# Patient Record
Sex: Female | Born: 1980 | Race: White | Hispanic: No | Marital: Single | Smoking: Current every day smoker
Health system: Southern US, Community
[De-identification: ages and names within clinical notes are randomized; demographics above are authoritative.]

## PROBLEM LIST (undated history)

## (undated) ENCOUNTER — Emergency Department (HOSPITAL_COMMUNITY): Payer: 59 | Source: Home / Self Care

## (undated) DIAGNOSIS — F1410 Cocaine abuse, uncomplicated: Secondary | ICD-10-CM

## (undated) DIAGNOSIS — F1010 Alcohol abuse, uncomplicated: Secondary | ICD-10-CM

## (undated) DIAGNOSIS — F1110 Opioid abuse, uncomplicated: Secondary | ICD-10-CM

---

## 2017-09-19 ENCOUNTER — Emergency Department (HOSPITAL_COMMUNITY)
Admission: EM | Admit: 2017-09-19 | Discharge: 2017-09-19 | Disposition: A | Discharge status: Home / Self Care | Payer: Self-pay | Attending: Emergency Medicine | Admitting: Emergency Medicine

## 2017-09-19 ENCOUNTER — Other Ambulatory Visit: Payer: Self-pay

## 2017-09-19 ENCOUNTER — Emergency Department (HOSPITAL_COMMUNITY): Payer: Self-pay

## 2017-09-19 DIAGNOSIS — X501XXA Overexertion from prolonged static or awkward postures, initial encounter: Secondary | ICD-10-CM | POA: Insufficient documentation

## 2017-09-19 DIAGNOSIS — Y999 Unspecified external cause status: Secondary | ICD-10-CM | POA: Insufficient documentation

## 2017-09-19 DIAGNOSIS — Y939 Activity, unspecified: Secondary | ICD-10-CM | POA: Insufficient documentation

## 2017-09-19 DIAGNOSIS — Y929 Unspecified place or not applicable: Secondary | ICD-10-CM | POA: Insufficient documentation

## 2017-09-19 DIAGNOSIS — S93401A Sprain of unspecified ligament of right ankle, initial encounter: Secondary | ICD-10-CM | POA: Insufficient documentation

## 2017-09-19 MED ORDER — TRAMADOL HCL 50 MG PO TABS: 50.0000 mg | ORAL_TABLET | Freq: Four times a day (QID) | ORAL | 0 refills | Status: DC | PRN

## 2017-09-19 MED ORDER — IBUPROFEN 400 MG PO TABS
600.0000 mg | ORAL_TABLET | Freq: Once | ORAL | Status: AC
  Administered 2017-09-19: 600 mg via ORAL
  Filled 2017-09-19: qty 1

## 2017-09-19 MED ORDER — IBUPROFEN 600 MG PO TABS: 600.0000 mg | ORAL_TABLET | Freq: Four times a day (QID) | ORAL | 0 refills | Status: DC | PRN

## 2017-09-19 NOTE — Progress Notes (Signed)
Orthopedic Tech Progress Note Patient Details:  Sarah Cervantes July 27, 1980 [id number]  Ortho Devices Type of Ortho Device: Crutches, ASO Ortho Device/Splint Location: RLE Ortho Device/Splint Interventions: Ordered, Application, Adjustment   Post Interventions Patient Tolerated: Well Instructions Provided: Care of device   Braulio Bosch 09/19/2017, 5:13 PM

## 2017-09-19 NOTE — ED Notes (Signed)
Ortho tech at bedside 

## 2017-09-19 NOTE — ED Notes (Signed)
Patient transported to X-ray 

## 2017-09-19 NOTE — ED Triage Notes (Signed)
Pt tripped on porch steps today walking up the stairs and hurt her right ankle. Pt states she is unable to put weight on her right ankle

## 2017-09-19 NOTE — ED Provider Notes (Signed)
  [facility] EMERGENCY DEPARTMENT Provider Note   CSN: [id number] Arrival date & time: 09/19/17  1541     History   Chief Complaint Chief Complaint  Patient presents with  . Ankle Pain    HPI Treyana Sturgell is a 37 y.o. female.  The history is provided by the patient. No language interpreter was used.  Ankle Pain   The incident occurred less than 1 hour ago. The injury mechanism was torsion. The pain is present in the right ankle. The pain is moderate. The pain has been constant since onset. Associated symptoms include numbness. She has tried nothing for the symptoms. The treatment provided no relief.    No past medical history on file.  There are no active problems to display for this patient.      OB History   None      Home Medications    Prior to Admission medications   Not on File    Family History No family history on file.  Social History Social History   Tobacco Use  . Smoking status: Not on file  Substance Use Topics  . Alcohol use: Not on file  . Drug use: Not on file     Allergies   Patient has no allergy information on record.   Review of Systems Review of Systems  Neurological: Positive for numbness.  All other systems reviewed and are negative.    Physical Exam Updated Vital Signs BP (!) 147/93 (BP Location: Right Arm)   Pulse 73   Temp 97.6 F (36.4 C) (Oral)   Resp 18   LMP 08/19/2017 (Within Weeks)   SpO2 98%   Physical Exam  Constitutional: She appears well-developed and well-nourished.  Cardiovascular: Normal rate.  Pulmonary/Chest: Effort normal.  Musculoskeletal: She exhibits tenderness.  Swollen right ankle, pain with range of motion  nv intact   Neurological: She is alert.  Skin: Skin is warm.  Psychiatric: She has a normal mood and affect.     ED Treatments / Results  Labs (all labs ordered are listed, but only abnormal results are displayed) Labs Reviewed - No data to  display  EKG None  Radiology No results found.  Procedures Procedures (including critical care time)  Medications Ordered in ED Medications  ibuprofen (ADVIL,MOTRIN) tablet 600 mg (has no administration in time range)     Initial Impression / Assessment and Plan / ED Course  I have reviewed the triage vital signs and the nursing notes.  Pertinent labs & imaging results that were available during my care of the patient were reviewed by me and considered in my medical decision making (see chart for details).    Pt placed in an aso and given crutches.  Pt advised to follow up with Orthopaedist if pain persist   Final Clinical Impressions(s) / ED Diagnoses   Final diagnoses:  Sprain of right ankle, unspecified ligament, initial encounter    ED Discharge Orders        Ordered    ibuprofen (ADVIL,MOTRIN) 600 MG tablet  Every 6 hours PRN     09/19/17 1652    An After Visit Summary was printed and given to the patient.   Fransico Meadow, [place] 09/19/17 1653    Mesner, Corene Cornea, MD 09/19/17 2230

## 2017-09-28 ENCOUNTER — Ambulatory Visit: Payer: Self-pay | Admitting: Podiatry

## 2017-10-09 ENCOUNTER — Ambulatory Visit (INDEPENDENT_AMBULATORY_CARE_PROVIDER_SITE_OTHER): Payer: Self-pay | Admitting: Podiatry

## 2017-10-09 ENCOUNTER — Encounter: Payer: Self-pay | Admitting: Podiatry

## 2017-10-09 ENCOUNTER — Ambulatory Visit (INDEPENDENT_AMBULATORY_CARE_PROVIDER_SITE_OTHER): Payer: Self-pay

## 2017-10-09 DIAGNOSIS — T148XXA Other injury of unspecified body region, initial encounter: Secondary | ICD-10-CM

## 2017-10-09 DIAGNOSIS — D3613 Benign neoplasm of peripheral nerves and autonomic nervous system of lower limb, including hip: Secondary | ICD-10-CM

## 2017-10-09 DIAGNOSIS — S82891A Other fracture of right lower leg, initial encounter for closed fracture: Secondary | ICD-10-CM

## 2017-10-09 MED ORDER — MELOXICAM 15 MG PO TABS: 15.0000 mg | ORAL_TABLET | Freq: Every day | ORAL | 0 refills | Status: DC

## 2017-10-10 NOTE — Progress Notes (Signed)
Subjective:   Patient ID: Sarah Cervantes, female   DOB: 37 y.o.   MRN: [id number]   HPI 37 year old female presents presents the office today for concerns of right ankle and foot pain.  She states that she has had a neuroma to the right foot and she points to the second interspace where she gets burning pain to the area into the second and third toes.  She is previously had area "frozen" she has had a steroid injection.  She states that after the area was "frozen" the pain came back and came back even worse about 6 months afterwards.  She was later seen by another doctor she had steroid injections which helped for some time.  She presents today for follow-up evaluation.  Also more recently she fell about 3 weeks ago injuring her right ankle she was seen in the emergency department.  She was put into an ankle brace but she cannot wear it at work she states.   Review of Systems  All other systems reviewed and are negative.  History reviewed. No pertinent past medical history.  History reviewed. No pertinent surgical history.   Current Outpatient Medications:  .  ibuprofen (ADVIL,MOTRIN) 600 MG tablet, Take 1 tablet (600 mg total) by mouth every 6 (six) hours as needed., Disp: 30 tablet, Rfl: 0 .  meloxicam (MOBIC) 15 MG tablet, Take 1 tablet (15 mg total) by mouth daily., Disp: 30 tablet, Rfl: 0 .  naproxen sodium (ALEVE) 220 MG tablet, Take 220 mg by mouth 2 (two) times daily as needed (for headaches)., Disp: , Rfl:  .  traMADol (ULTRAM) 50 MG tablet, Take 1 tablet (50 mg total) by mouth every 6 (six) hours as needed., Disp: 15 tablet, Rfl: 0  Allergies  Allergen Reactions  . Latex Swelling and Rash    Hands swell         Objective:  Physical Exam  General: AAO x3, NAD  Dermatological: Skin is warm, dry and supple bilateral. Nails x 10 are well manicured; remaining integument appears unremarkable at this time. There are no open sores, no preulcerative lesions, no rash or signs of infection  present.  Vascular: Dorsalis Pedis artery and Posterior Tibial artery pedal pulses are 2/4 bilateral with immedate capillary fill time. Pedal hair growth present. No varicosities and no lower extremity edema present bilateral. There is no pain with calf compression, swelling, warmth, erythema.   Neruologic: Grossly intact via light touch bilateral. Vibratory intact via tuning fork bilateral. Protective threshold with Semmes Wienstein monofilament intact to all pedal sites bilateral. Patellar and Achilles deep tendon reflexes 2+ bilateral. No Babinski or clonus noted bilateral.   Musculoskeletal: There is tenderness palpation of the second interspace on the right foot there is palpable neuroma identified and subjectively there is numbness and tingling into the second and third toes upon palpation.  There is no area of tenderness identified to the metatarsals.  There is tenderness to the medial malleolus as well as on the ATFL as well as the syndesmosis of the right ankle.  Ankle joint range of motion appears to be intact however there is diffuse tenderness to the ankle joint.  Muscular strength 5/5 in all groups tested bilateral.     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  We discussed the treatment options for the neuroma and after discussion she wished to proceed with a steroid injection.  See procedure note below.  In regards to her ankle I do think she is immobilization in the cam boot.  We had a sample of the boot that I did give her today.  I want her to wear this much possible.  She states that she has to work and she cannot work at this point.  I recommend her to get an over-the-counter ankle brace to wear during that  timeframe if she has to work and cannot wear the brace. -Follow-up in 4 weeks or sooner if needed.  Procedure: Injection neuroma right third interspace Discussed alternatives, risks, complications and verbal consent was obtained.  Location: Right third interspace Skin Prep: Alcohol. Injectate: 0.5cc 0.5% marcaine plain, 0.5 cc 2% lidocaine plain and, 1 cc kenalog 10. Disposition: Patient tolerated procedure well. Injection site dressed with a band-aid.  Post-injection care was discussed and return precautions discussed.   Sarah Cervantes DPM

## 2017-10-13 ENCOUNTER — Emergency Department (HOSPITAL_COMMUNITY)
Admission: EM | Admit: 2017-10-13 | Discharge: 2017-10-13 | Disposition: A | Discharge status: Home / Self Care | Payer: Self-pay | Attending: Emergency Medicine | Admitting: Emergency Medicine

## 2017-10-13 ENCOUNTER — Encounter: Payer: Self-pay | Admitting: Emergency Medicine

## 2017-10-13 DIAGNOSIS — R55 Syncope and collapse: Secondary | ICD-10-CM | POA: Insufficient documentation

## 2017-10-13 DIAGNOSIS — Z5321 Procedure and treatment not carried out due to patient leaving prior to being seen by health care provider: Secondary | ICD-10-CM | POA: Insufficient documentation

## 2017-10-13 MED ORDER — ALBUTEROL SULFATE (2.5 MG/3ML) 0.083% IN NEBU
5.0000 mg | INHALATION_SOLUTION | Freq: Once | RESPIRATORY_TRACT | Status: AC
  Administered 2017-10-13: 5 mg via RESPIRATORY_TRACT
  Filled 2017-10-13: qty 6

## 2017-10-13 NOTE — ED Notes (Signed)
Called pt no response.  

## 2017-10-13 NOTE — ED Triage Notes (Signed)
Pt reports Sho[employer] today while at work. Pt reports (+) syncope episode while walking from work to car in parking lot. Pt reports pain in back and legs since fall. Pt has hx of childhood asthma.

## 2018-03-23 ENCOUNTER — Encounter (HOSPITAL_COMMUNITY): Payer: Self-pay

## 2018-03-23 ENCOUNTER — Emergency Department (HOSPITAL_COMMUNITY): Payer: Self-pay

## 2018-03-23 ENCOUNTER — Inpatient Hospital Stay (HOSPITAL_COMMUNITY)
Admission: EM | Admit: 2018-03-23 | Discharge: 2018-03-26 | DRG: 872 | Disposition: A | Discharge status: Other Acute Inpatient Hospital | Payer: Self-pay | Attending: Internal Medicine | Admitting: Internal Medicine

## 2018-03-23 ENCOUNTER — Other Ambulatory Visit: Payer: Self-pay

## 2018-03-23 DIAGNOSIS — R45851 Suicidal ideations: Secondary | ICD-10-CM | POA: Diagnosis present

## 2018-03-23 DIAGNOSIS — F1120 Opioid dependence, uncomplicated: Secondary | ICD-10-CM

## 2018-03-23 DIAGNOSIS — F1410 Cocaine abuse, uncomplicated: Secondary | ICD-10-CM

## 2018-03-23 DIAGNOSIS — F17210 Nicotine dependence, cigarettes, uncomplicated: Secondary | ICD-10-CM | POA: Diagnosis present

## 2018-03-23 DIAGNOSIS — A4151 Sepsis due to Escherichia coli [E. coli]: Principal | ICD-10-CM | POA: Diagnosis present

## 2018-03-23 DIAGNOSIS — Z9114 Patient's other noncompliance with medication regimen: Secondary | ICD-10-CM

## 2018-03-23 DIAGNOSIS — F10129 Alcohol abuse with intoxication, unspecified: Secondary | ICD-10-CM

## 2018-03-23 DIAGNOSIS — F1110 Opioid abuse, uncomplicated: Secondary | ICD-10-CM | POA: Diagnosis present

## 2018-03-23 DIAGNOSIS — A419 Sepsis, unspecified organism: Secondary | ICD-10-CM

## 2018-03-23 DIAGNOSIS — F1010 Alcohol abuse, uncomplicated: Secondary | ICD-10-CM

## 2018-03-23 DIAGNOSIS — Z91040 Latex allergy status: Secondary | ICD-10-CM

## 2018-03-23 DIAGNOSIS — F1920 Other psychoactive substance dependence, uncomplicated: Secondary | ICD-10-CM

## 2018-03-23 DIAGNOSIS — K921 Melena: Secondary | ICD-10-CM | POA: Diagnosis present

## 2018-03-23 DIAGNOSIS — F1420 Cocaine dependence, uncomplicated: Secondary | ICD-10-CM | POA: Diagnosis present

## 2018-03-23 DIAGNOSIS — F1414 Cocaine abuse with cocaine-induced mood disorder: Secondary | ICD-10-CM

## 2018-03-23 DIAGNOSIS — A4150 Gram-negative sepsis, unspecified: Secondary | ICD-10-CM | POA: Diagnosis present

## 2018-03-23 DIAGNOSIS — F3181 Bipolar II disorder: Secondary | ICD-10-CM | POA: Diagnosis present

## 2018-03-23 DIAGNOSIS — F4310 Post-traumatic stress disorder, unspecified: Secondary | ICD-10-CM

## 2018-03-23 DIAGNOSIS — R7881 Bacteremia: Secondary | ICD-10-CM

## 2018-03-23 HISTORY — DX: Alcohol abuse, uncomplicated: F10.10

## 2018-03-23 HISTORY — DX: Cocaine abuse, uncomplicated: F14.10

## 2018-03-23 HISTORY — DX: Opioid abuse, uncomplicated: F11.10

## 2018-03-23 LAB — URINALYSIS, ROUTINE W REFLEX MICROSCOPIC
BILIRUBIN URINE: NEGATIVE
GLUCOSE, UA: NEGATIVE mg/dL
KETONES UR: NEGATIVE mg/dL
Nitrite: NEGATIVE
PH: 7 (ref 5.0–8.0)
Protein, ur: NEGATIVE mg/dL
Specific Gravity, Urine: 1.005 (ref 1.005–1.030)

## 2018-03-23 LAB — CBC WITH DIFFERENTIAL/PLATELET
ABS IMMATURE GRANULOCYTES: 0.08 10*3/uL — AB (ref 0.00–0.07)
BASOS PCT: 0 %
Basophils Absolute: 0.1 10*3/uL (ref 0.0–0.1)
EOS PCT: 0 %
Eosinophils Absolute: 0 10*3/uL (ref 0.0–0.5)
HCT: 40.1 % (ref 36.0–46.0)
Hemoglobin: 12.7 g/dL (ref 12.0–15.0)
Immature Granulocytes: 0 %
Lymphocytes Relative: 9 %
Lymphs Abs: 1.7 10*3/uL (ref 0.7–4.0)
MCH: 29.8 pg (ref 26.0–34.0)
MCHC: 31.7 g/dL (ref 30.0–36.0)
MCV: 94.1 fL (ref 80.0–100.0)
MONOS PCT: 7 %
Monocytes Absolute: 1.3 10*3/uL — ABNORMAL HIGH (ref 0.1–1.0)
NEUTROS ABS: 15.1 10*3/uL — AB (ref 1.7–7.7)
Neutrophils Relative %: 84 %
PLATELETS: 559 10*3/uL — AB (ref 150–400)
RBC: 4.26 MIL/uL (ref 3.87–5.11)
RDW: 14.8 % (ref 11.5–15.5)
WBC: 18.3 10*3/uL — AB (ref 4.0–10.5)
nRBC: 0 % (ref 0.0–0.2)

## 2018-03-23 LAB — ETHANOL: Alcohol, Ethyl (B): <10 mg/dL (ref <10)

## 2018-03-23 LAB — COMPREHENSIVE METABOLIC PANEL
ALT: 16 U/L (ref 0–44)
AST: 14 U/L — AB (ref 15–41)
Albumin: 2.9 g/dL — ABNORMAL LOW (ref 3.5–5.0)
Alkaline Phosphatase: 59 U/L (ref 38–126)
Anion gap: 8 (ref 5–15)
BILIRUBIN TOTAL: 0.9 mg/dL (ref 0.3–1.2)
BUN: 8 mg/dL (ref 6–20)
CALCIUM: 8.1 mg/dL — AB (ref 8.9–10.3)
CO2: 23 mmol/L (ref 22–32)
CREATININE: 0.82 mg/dL (ref 0.44–1.00)
Chloride: 108 mmol/L (ref 98–111)
GFR calc Af Amer: >60 mL/min (ref >60)
GLUCOSE: 106 mg/dL — AB (ref 70–99)
POTASSIUM: 3.7 mmol/L (ref 3.5–5.1)
Sodium: 139 mmol/L (ref 135–145)
TOTAL PROTEIN: 7.1 g/dL (ref 6.5–8.1)

## 2018-03-23 LAB — BASIC METABOLIC PANEL
Anion gap: 7 (ref 5–15)
BUN: 9 mg/dL (ref 6–20)
CALCIUM: 8 mg/dL — AB (ref 8.9–10.3)
CHLORIDE: 108 mmol/L (ref 98–111)
CO2: 24 mmol/L (ref 22–32)
CREATININE: 0.85 mg/dL (ref 0.44–1.00)
GFR calc Af Amer: >60 mL/min (ref >60)
GFR calc non Af Amer: >60 mL/min (ref >60)
GLUCOSE: 96 mg/dL (ref 70–99)
Potassium: 3.8 mmol/L (ref 3.5–5.1)
Sodium: 139 mmol/L (ref 135–145)

## 2018-03-23 LAB — I-STAT CG4 LACTIC ACID, ED: LACTIC ACID, VENOUS: 0.97 mmol/L (ref 0.5–1.9)

## 2018-03-23 MED ORDER — LORAZEPAM 2 MG/ML IJ SOLN
0.0000 mg | Freq: Four times a day (QID) | INTRAMUSCULAR | Status: DC
  Administered 2018-03-24: 2 mg via INTRAVENOUS
  Filled 2018-03-23: qty 1

## 2018-03-23 MED ORDER — THIAMINE HCL 100 MG/ML IJ SOLN
100.0000 mg | Freq: Every day | INTRAMUSCULAR | Status: DC
  Filled 2018-03-23: qty 2

## 2018-03-23 MED ORDER — VANCOMYCIN HCL IN DEXTROSE 1-5 GM/200ML-% IV SOLN
1000.0000 mg | INTRAVENOUS | Status: AC
  Administered 2018-03-23: 1000 mg via INTRAVENOUS
  Filled 2018-03-23: qty 200

## 2018-03-23 MED ORDER — VANCOMYCIN HCL 10 G IV SOLR
1500.0000 mg | Freq: Two times a day (BID) | INTRAVENOUS | Status: DC
  Administered 2018-03-24: 1500 mg via INTRAVENOUS
  Filled 2018-03-23: qty 1500

## 2018-03-23 MED ORDER — ADULT MULTIVITAMIN W/MINERALS CH
1.0000 | ORAL_TABLET | Freq: Every day | ORAL | Status: DC
  Administered 2018-03-23 – 2018-03-26 (×4): 1 via ORAL
  Filled 2018-03-23 (×4): qty 1

## 2018-03-23 MED ORDER — VANCOMYCIN HCL IN DEXTROSE 1-5 GM/200ML-% IV SOLN
1000.0000 mg | Freq: Once | INTRAVENOUS | Status: DC
  Administered 2018-03-23: 1000 mg via INTRAVENOUS
  Filled 2018-03-23: qty 200

## 2018-03-23 MED ORDER — LORAZEPAM 2 MG/ML IJ SOLN
1.0000 mg | Freq: Four times a day (QID) | INTRAMUSCULAR | Status: DC | PRN
  Administered 2018-03-24: 1 mg via INTRAVENOUS
  Filled 2018-03-23: qty 1

## 2018-03-23 MED ORDER — KETOROLAC TROMETHAMINE 30 MG/ML IJ SOLN
30.0000 mg | Freq: Once | INTRAMUSCULAR | Status: AC
  Administered 2018-03-23: 30 mg via INTRAVENOUS
  Filled 2018-03-23: qty 1

## 2018-03-23 MED ORDER — LORAZEPAM 2 MG/ML IJ SOLN: 0.0000 mg | Freq: Two times a day (BID) | INTRAMUSCULAR | Status: DC

## 2018-03-23 MED ORDER — SODIUM CHLORIDE 0.9 % IV SOLN
INTRAVENOUS | Status: DC
  Administered 2018-03-23 – 2018-03-24 (×3): via INTRAVENOUS

## 2018-03-23 MED ORDER — LORAZEPAM 1 MG PO TABS
1.0000 mg | ORAL_TABLET | Freq: Four times a day (QID) | ORAL | Status: DC | PRN
  Administered 2018-03-23: 1 mg via ORAL
  Filled 2018-03-23: qty 1

## 2018-03-23 MED ORDER — FOLIC ACID 1 MG PO TABS
1.0000 mg | ORAL_TABLET | Freq: Every day | ORAL | Status: DC
  Administered 2018-03-23 – 2018-03-26 (×4): 1 mg via ORAL
  Filled 2018-03-23 (×4): qty 1

## 2018-03-23 MED ORDER — SODIUM CHLORIDE 0.9 % IV SOLN
2.0000 g | Freq: Three times a day (TID) | INTRAVENOUS | Status: DC
  Administered 2018-03-23 – 2018-03-24 (×2): 2 g via INTRAVENOUS
  Filled 2018-03-23 (×3): qty 2

## 2018-03-23 MED ORDER — ENOXAPARIN SODIUM 60 MG/0.6ML ~~LOC~~ SOLN
60.0000 mg | SUBCUTANEOUS | Status: DC
  Administered 2018-03-23 – 2018-03-24 (×2): 60 mg via SUBCUTANEOUS
  Filled 2018-03-23 (×2): qty 0.6

## 2018-03-23 MED ORDER — VITAMIN B-1 100 MG PO TABS
100.0000 mg | ORAL_TABLET | Freq: Every day | ORAL | Status: DC
  Administered 2018-03-23 – 2018-03-26 (×4): 100 mg via ORAL
  Filled 2018-03-23 (×4): qty 1

## 2018-03-23 MED ORDER — METRONIDAZOLE IN NACL 5-0.79 MG/ML-% IV SOLN
500.0000 mg | Freq: Three times a day (TID) | INTRAVENOUS | Status: DC
  Administered 2018-03-23 – 2018-03-24 (×3): 500 mg via INTRAVENOUS
  Filled 2018-03-23 (×3): qty 100

## 2018-03-23 MED ORDER — SODIUM CHLORIDE 0.9 % IV SOLN
2.0000 g | Freq: Once | INTRAVENOUS | Status: AC
  Administered 2018-03-23: 2 g via INTRAVENOUS
  Filled 2018-03-23: qty 2

## 2018-03-23 MED ORDER — VANCOMYCIN HCL IN DEXTROSE 1-5 GM/200ML-% IV SOLN: 1000.0000 mg | Freq: Once | INTRAVENOUS | Status: DC

## 2018-03-23 NOTE — Progress Notes (Signed)
A consult was received from an ED physician for Vancomycin and Cefepime per pharmacy dosing.  The patient's profile has been reviewed for ht/wt/allergies/indication/available labs.   A one time order has been placed for Cefepime 2g and Vancomycin 1g + 1g.   Further antibiotics/pharmacy consults should be ordered by admitting physician if indicated.                       Thank you, Lolita Patella 03/23/2018  2:16 PM

## 2018-03-23 NOTE — ED Notes (Signed)
Hospitalist paged for sitter order

## 2018-03-23 NOTE — ED Notes (Signed)
Bed: BD53 Expected date:  Expected time:  Means of arrival:  Comments: Sepsis fever 103

## 2018-03-23 NOTE — Progress Notes (Signed)
Pharmacy Antibiotic Note  Sarah Cervantes is a 37 y.o. female admitted on 03/23/2018 with sepsis.  Pharmacy has been consulted for vanc/cefepime dosing.  Plan: 1) Vancomycin 1g x 1 then another 1g x 1 in ER for total loading dose of 2g then 1500mg  IV q12 - goal AUC 400-500 2) Cefepime 2g x 1 then 2g IV q8 3) Daily SCr  Height: 5\' 4"  (162.6 cm) Weight: 290 lb (131.5 kg) IBW/kg (Calculated) : 54.7  Temp (24hrs), Avg:100.9 F (38.3 C), Min:100.9 F (38.3 C), Max:100.9 F (38.3 C)  Recent Labs  Lab 03/23/18 1411 03/23/18 1435  WBC 18.3*  --   CREATININE 0.82  --   LATICACIDVEN  --  0.97    Estimated Creatinine Clearance: 124.2 mL/min (by C-G formula based on SCr of 0.82 mg/dL).    Allergies  Allergen Reactions  . Latex Swelling and Rash    Hands swell    Thank you for allowing pharmacy to be a part of this patient's care.  Kara Mead 03/23/2018 4:46 PM

## 2018-03-23 NOTE — ED Notes (Signed)
Upon speaking with pt, pt states "Have you ever wanted to die?" This nurse asked the pt if she was suicidal. Pt endorses SI. Pt states she has auditory hallucinations. Pt endorses at time they are command hallucinations to harm herself. Pt states that she hasnt been taking her medications

## 2018-03-23 NOTE — H&P (Signed)
History and Physical    Sarah Cervantes XHB:[id number] DOB: 19-Jan-1981 DOA: 03/23/2018  PCP: Patient, No Pcp Per Patient coming from: home    Chief Complaint: Fever  HPI: Sarah Cervantes is a 37 y.o. 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  She did notice some blood flecks in the stool.  Patient received Tylenol by EMS before coming to the ER.  She also has a history of alcohol use and drinks 1-2 case of alcohol per week.  She works as a Training and development officer in [employer].  She reports she lives alone and she has no family.  She denies any chest pain shortness of breath or cough.  She denies abdominal pain or urinary complaints. ED Course: Patient received vancomycin, Flagyl, and cefepime.  Review of Systems: See above Past Medical History:  Diagnosis Date  . Cocaine abuse, daily use ([place]) 03/23/2018  . ETOH abuse 03/23/2018  . Heroin abuse ([place]) 03/23/2018    History reviewed. No pertinent surgical history.   reports that she has been smoking. She has a 30.00 pack-year smoking history. She has never used smokeless tobacco. She reports that she drinks alcohol. She reports that she has current or past drug history.  Allergies  Allergen Reactions  . Latex Swelling and Rash    Hands swell    No family history on file.  Prior to Admission medications   Not on File    Physical Exam: Vitals:   03/23/18 1600 03/23/18 1601 03/23/18 1615 03/23/18 1627  BP: 100/63 100/63    Pulse: 62 65 60 (!) 59  Resp: (!) 21 14 (!) 24 (!) 26  Temp:      TempSrc:      SpO2: 92% 92% 97% 97%  Weight:      Height:        Constitutional: NAD, calm, comfortable Vitals:   03/23/18 1600 03/23/18 1601 03/23/18 1615 03/23/18 1627  BP: 100/63 100/63    Pulse: 62 65 60 (!) 59  Resp: (!) 21 14 (!) 24 (!) 26  Temp:      TempSrc:      SpO2: 92% 92% 97%  97%  Weight:      Height:       Eyes: PERRL, lids and conjunctivae normal ENMT: Mucous membranes are moist. Posterior pharynx clear of any exudate or lesions.Normal dentition.  Neck: normal, supple, no masses, no thyromegaly Respiratory: clear to auscultation bilaterally, no wheezing, no crackles. Normal respiratory effort. No accessory muscle use.  Cardiovascular: Regular rate and rhythm, NO  murmurs / rubs / gallops. No extremity edema. 2+ pedal pulses. No carotid bruits.  Abdomen: no tenderness, no masses palpated. No hepatosplenomegaly. Bowel sounds positive.  Musculoskeletal: no clubbing / cyanosis. No joint deformity upper and lower extremities. Good ROM, no contractures. Normal muscle tone.  Skin: no rashes, lesions, ulcers. No induration Neurologic: CN 2-12 grossly intact. Sensation intact, DTR normal. Strength 5/5 in all 4.  Psychiatric: Normal judgment and insight. Alert and oriented x 3. Normal mood.    Labs on Admission: I have personally reviewed following labs and imaging studies  CBC: Recent Labs  Lab 03/23/18 1411  WBC 18.3*  NEUTROABS 15.1*  HGB 12.7  HCT 40.1  MCV 94.1  PLT 810*   Basic Metabolic Panel: Recent Labs  Lab 03/23/18 1411  NA 139  K 3.7  CL 108  CO2 23  GLUCOSE 106*  BUN 8  CREATININE 0.82  CALCIUM 8.1*   GFR: Estimated Creatinine Clearance: 124.2 mL/min (by C-G formula based on SCr of 0.82 mg/dL). Liver Function Tests: Recent Labs  Lab 03/23/18 1411  AST 14*  ALT 16  ALKPHOS 59  BILITOT 0.9  PROT 7.1  ALBUMIN 2.9*   No results for input(s): LIPASE, AMYLASE in the last 168 hours. No results for input(s): AMMONIA in the last 168 hours. Coagulation Profile: No results for input(s): INR, PROTIME in the last 168 hours. Cardiac Enzymes: No results for input(s): CKTOTAL, CKMB, CKMBINDEX, TROPONINI in the last 168 hours. BNP (last 3 results) No results for input(s): PROBNP in the last 8760 hours. HbA1C: No results for input(s):  HGBA1C in the last 72 hours. CBG: No results for input(s): GLUCAP in the last 168 hours. Lipid Profile: No results for input(s): CHOL, HDL, LDLCALC, TRIG, CHOLHDL, LDLDIRECT in the last 72 hours. Thyroid Function Tests: No results for input(s): TSH, T4TOTAL, FREET4, T3FREE, THYROIDAB in the last 72 hours. Anemia Panel: No results for input(s): VITAMINB12, FOLATE, FERRITIN, TIBC, IRON, RETICCTPCT in the last 72 hours. Urine analysis:    Component Value Date/Time   COLORURINE YELLOW 03/23/2018 [place] 03/23/2018 1428   LABSPEC 1.005 03/23/2018 1428   PHURINE 7.0 03/23/2018 1428   GLUCOSEU NEGATIVE 03/23/2018 1428   HGBUR SMALL (A) 03/23/2018 1428   BILIRUBINUR NEGATIVE 03/23/2018 1428   KETONESUR NEGATIVE 03/23/2018 1428   PROTEINUR NEGATIVE 03/23/2018 1428   NITRITE NEGATIVE 03/23/2018 1428   LEUKOCYTESUR SMALL (A) 03/23/2018 1428    Radiological Exams on Admission: Dg Chest Port 1 View  Result Date: 03/23/2018 CLINICAL DATA:  Pt. admits to snorting cocaine and using heroin last night. No known heart or lung conditions. EXAM: PORTABLE CHEST 1 VIEW COMPARISON:  None. FINDINGS: The heart size and mediastinal contours are within normal limits. Both lungs are clear. The visualized skeletal structures are unremarkable. IMPRESSION: No active disease. Electronically Signed   By: Nolon Nations M.D.   On: 03/23/2018 14:31    EKG: Independently reviewed.   Assessment/Plan Active Problems:   * No active hospital problems. * #1 rule out sepsis patient with history of IV drug abuse and fever.  Patient received vancomycin Flagyl and cefepime in the ER.  I will obtain MRSA PCR.  I will continue vancomycin and cefepime and follow-up blood cultures.  I do not hear a murmur I will obtain an echocardiogram to rule out endocarditis.  She has leukocytosis of 18.3.  #2 alcohol abuse I will place her on CIWA protocol. DVT prophylaxis: Lovenox Code Status: Full code Family  Communication: She has no family  Disposition Plan: Pending clinical improvement Consults called: None Admission status: Inpatient  Georgette Shell MD [facility] If 7PM-7AM, please contact night-coverage [email or web address] Password [facility]  03/23/2018, 4:34 PM

## 2018-03-23 NOTE — ED Triage Notes (Signed)
Patient did receive 1000mg  of acetaminophen orally, and has received about 350 mL of normal saline.

## 2018-03-23 NOTE — ED Notes (Signed)
Patient admits to using snorting cocaine last night and using IV heroin last night along with ETOH.

## 2018-03-23 NOTE — ED Provider Notes (Addendum)
Addendum [facility] DEPT Provider Note   CSN: [id number] Arrival date & time: 03/23/18  1258   Level 5 caveat acuity of situation  History   Chief Complaint Chief Complaint  Patient presents with  . Fatigue  . Fever    HPI Sarah Cervantes is a 37 y.o. female.  HPI With subjective fever onset last night accompanied by anterior chest pain since this morning which is constant.  She admits to vomiting 1 or 2 times since yesterday and having 2 or 3 episodes of diarrhea with some blood flecks in it.  EMS reported that patient had temperature 103 degrees in the field they treated patient with Tylenol prior to coming here and she received saline 350 mL intravenously prior to coming here.  Patient also admitted to nursing that she used cocaine last night and she admits to me injecting heroin yesterday while using alcohol. Past Medical History:  Diagnosis Date  . Cocaine abuse, daily use ([place]) 03/23/2018  . ETOH abuse 03/23/2018  . Heroin abuse ([facility]) 03/23/2018   Past medical history "mental health issues" stating "I am supposed to be on lithium but have not taken it for long time There are no active problems to display for this patient.   History reviewed. No pertinent surgical history.   OB History   None      Home Medications    Prior to Admission medications   Not on File    Family History No family history on file.  Social History Social History   Tobacco Use  . Smoking status: Current Every Day Smoker    Packs/day: 1.00    Years: 30.00    Pack years: 30.00  . Smokeless tobacco: Never Used  Substance Use Topics  . Alcohol use: Yes    Comment: Occ social drinker  . Drug use: Yes    Comment: Heroin and Cocain on 2018/03/23. Pt uses every night     Allergies   Latex   Review of Systems Review of Systems   Physical Exam Updated Vital Signs BP (!) 115/53 (BP Location: Right Arm)   Pulse 80   Temp (!) 100.9 F (38.3  C) (Rectal)   Resp (!) 24   Ht 5\' 4"  (1.626 m)   Wt 131.5 kg   LMP 03/23/2018 (Exact Date)   SpO2 92%   BMI 49.78 kg/m   Physical Exam  Constitutional:  Ill appearing sleepy, arousable to verbal stimulus  HENT:  Head: Normocephalic and atraumatic.  Eyes: Pupils are equal, round, and reactive to light. Conjunctivae are normal.  Neck: Neck supple. No tracheal deviation present. No thyromegaly present.  Cardiovascular: Normal rate, regular rhythm and normal heart sounds.  No murmur heard. Pulmonary/Chest: Effort normal and breath sounds normal.  Abdominal: Soft. Bowel sounds are normal. She exhibits no distension. There is no tenderness.  Morbidly obese  Musculoskeletal: Normal range of motion. She exhibits no edema or tenderness.  Neurological: She is alert. No cranial nerve deficit. Coordination normal.  Skin: Skin is warm and dry. No rash noted.  Right upper extremity with [place] antecubital fossa, no surrounding redness or tenderness.  Nursing note and vitals reviewed.    ED Treatments / Results  Labs (all labs ordered are listed, but only abnormal results are displayed) Labs Reviewed  CULTURE, BLOOD (ROUTINE X 2)  CULTURE, BLOOD (ROUTINE X 2)  COMPREHENSIVE METABOLIC PANEL  CBC WITH DIFFERENTIAL/PLATELET  URINALYSIS, ROUTINE W REFLEX MICROSCOPIC  I-STAT CG4 LACTIC ACID, ED  EKG None  Radiology No results found.  Procedures Procedures (including critical care time)  Medications Ordered in ED Medications  ceFEPIme (MAXIPIME) 2 g in sodium chloride 0.9 % 100 mL IVPB (has no administration in time range)  metroNIDAZOLE (FLAGYL) IVPB 500 mg (has no administration in time range)  vancomycin (VANCOCIN) IVPB 1000 mg/200 mL premix (has no administration in time range)     Initial Impression / Assessment and Plan / ED Course  I have reviewed the triage vital signs and the nursing notes.  Pertinent labs & imaging results that were available during my  care of the patient were reviewed by me and considered in my medical decision making (see chart for details).     Code sepsis called based on sirs criteria respiratory rate and temperature.  Source of infection unclear presently.  Concern for endocarditis given history of IV drug use and fever and chest pain Sepsis - Repeat Assessment  Performed at:    345pm  Vitals     Blood pressure (!) 115/53, pulse 80, temperature (!) 100.9 F (38.3 C), temperature source Rectal, resp. rate (!) 24, height 5\' 4"  (1.626 m), weight 131.5 kg, last menstrual period 03/23/2018, SpO2 92 %.  Heart:     Regular rate and rhythm  Lungs:    CTA  Capillary Refill:   <2 sec  Peripheral Pulse:   Radial pulse palpable  Skin:     Normal Color  3:40 PM patient feels improved and is stating she is hungry after treatment with IV  antibiotics    Dr.Williams consulted and will arrange for overnight stay  Concern for endocarditis and female with chest pain recent IV drug use and fever  Results for orders placed or performed during the hospital encounter of 03/23/18  Comprehensive metabolic panel  Result Value Ref Range   Sodium 139 135 - 145 mmol/L   Potassium 3.7 3.5 - 5.1 mmol/L   Chloride 108 98 - 111 mmol/L   CO2 23 22 - 32 mmol/L   Glucose, Bld 106 (H) 70 - 99 mg/dL   BUN 8 6 - 20 mg/dL   Creatinine, Ser 0.82 0.44 - 1.00 mg/dL   Calcium 8.1 (L) 8.9 - 10.3 mg/dL   Total Protein 7.1 6.5 - 8.1 g/dL   Albumin 2.9 (L) 3.5 - 5.0 g/dL   AST 14 (L) 15 - 41 U/L   ALT 16 0 - 44 U/L   Alkaline Phosphatase 59 38 - 126 U/L   Total Bilirubin 0.9 0.3 - 1.2 mg/dL   GFR calc non Af Amer >60 >60 mL/min   GFR calc Af Amer >60 >60 mL/min   Anion gap 8 5 - 15  CBC WITH DIFFERENTIAL  Result Value Ref Range   WBC 18.3 (H) 4.0 - 10.5 K/uL   RBC 4.26 3.87 - 5.11 MIL/uL   Hemoglobin 12.7 12.0 - 15.0 g/dL   HCT 40.1 36.0 - 46.0 %   MCV 94.1 80.0 - 100.0 fL   MCH 29.8 26.0 - 34.0 pg   MCHC 31.7 30.0 - 36.0 g/dL   RDW  14.8 11.5 - 15.5 %   Platelets 559 (H) 150 - 400 K/uL   nRBC 0.0 0.0 - 0.2 %   Neutrophils Relative % 84 %   Neutro Abs 15.1 (H) 1.7 - 7.7 K/uL   Lymphocytes Relative 9 %   Lymphs Abs 1.7 0.7 - 4.0 K/uL   Monocytes Relative 7 %   Monocytes Absolute 1.3 (H) 0.1 - 1.0  K/uL   Eosinophils Relative 0 %   Eosinophils Absolute 0.0 0.0 - 0.5 K/uL   Basophils Relative 0 %   Basophils Absolute 0.1 0.0 - 0.1 K/uL   Immature Granulocytes 0 %   Abs Immature Granulocytes 0.08 (H) 0.00 - 0.07 K/uL  Urinalysis, Routine w reflex microscopic  Result Value Ref Range   Color, Urine YELLOW YELLOW   APPearance CLEAR CLEAR   Specific Gravity, Urine 1.005 1.005 - 1.030   pH 7.0 5.0 - 8.0   Glucose, UA NEGATIVE NEGATIVE mg/dL   Hgb urine dipstick SMALL (A) NEGATIVE   Bilirubin Urine NEGATIVE NEGATIVE   Ketones, ur NEGATIVE NEGATIVE mg/dL   Protein, ur NEGATIVE NEGATIVE mg/dL   Nitrite NEGATIVE NEGATIVE   Leukocytes, UA SMALL (A) NEGATIVE   RBC / HPF 0-5 0 - 5 RBC/hpf   WBC, UA 6-10 0 - 5 WBC/hpf   Bacteria, UA RARE (A) NONE SEEN   Squamous Epithelial / LPF 0-5 0 - 5  I-Stat CG4 Lactic Acid, ED  (not at  [facility])  Result Value Ref Range   Lactic Acid, Venous 0.97 0.5 - 1.9 mmol/L   Dg Chest Port 1 View  Result Date: 03/23/2018 CLINICAL DATA:  Pt. admits to snorting cocaine and using heroin last night. No known heart or lung conditions. EXAM: PORTABLE CHEST 1 VIEW COMPARISON:  None. FINDINGS: The heart size and mediastinal contours are within normal limits. Both lungs are clear. The visualized skeletal structures are unremarkable. IMPRESSION: No active disease. Electronically Signed   By: Nolon Nations M.D.   On: 03/23/2018 14:31  Chest x-ray viewed by me. Lab work consistent with leukocytosis, consistent with sepsis Final Clinical Impressions(s) / ED Diagnoses  Dx Sepsis CRITICAL CARE Performed by: Orlie Dakin Total critical care time: 35 minutes Critical care time was exclusive of separately  billable procedures and treating other patients. Critical care was necessary to treat or prevent imminent or life-threatening deterioration. Critical care was time spent personally by me on the following activities: development of treatment plan with patient and/or surrogate as well as nursing, discussions with consultants, evaluation of patient's response to treatment, examination of patient, obtaining history from patient or surrogate, ordering and performing treatments and interventions, ordering and review of laboratory studies, ordering and review of radiographic studies, pulse oximetry and re-evaluation of patient's condition. Final diagnoses:  None    ED Discharge Orders    None       Orlie Dakin, MD 03/23/18 1549 Addendum: Expressed suicidal ideation to me and to nursing staff.  Suggest suicide precautions   Orlie Dakin, MD 03/23/18 1553

## 2018-03-23 NOTE — ED Triage Notes (Signed)
Pt comes from home. Pt arrived via [facility]. Pt called 911 due to being lethargic and when EMS arrived having a fever of 103 F Oral Temp. Pt is currently AOx4 and at baseline ambulatory. Patient denies any drug use however did drink some ETOH last night. [facility] stated that patient made comment about having dark, foul smelling urine.

## 2018-03-24 ENCOUNTER — Inpatient Hospital Stay (HOSPITAL_COMMUNITY): Payer: Self-pay

## 2018-03-24 DIAGNOSIS — R509 Fever, unspecified: Secondary | ICD-10-CM

## 2018-03-24 DIAGNOSIS — F1414 Cocaine abuse with cocaine-induced mood disorder: Secondary | ICD-10-CM

## 2018-03-24 DIAGNOSIS — F3181 Bipolar II disorder: Secondary | ICD-10-CM

## 2018-03-24 DIAGNOSIS — F1920 Other psychoactive substance dependence, uncomplicated: Secondary | ICD-10-CM

## 2018-03-24 DIAGNOSIS — F419 Anxiety disorder, unspecified: Secondary | ICD-10-CM

## 2018-03-24 DIAGNOSIS — F4310 Post-traumatic stress disorder, unspecified: Secondary | ICD-10-CM

## 2018-03-24 DIAGNOSIS — R45851 Suicidal ideations: Secondary | ICD-10-CM

## 2018-03-24 DIAGNOSIS — F10129 Alcohol abuse with intoxication, unspecified: Secondary | ICD-10-CM

## 2018-03-24 LAB — BLOOD CULTURE ID PANEL (REFLEXED)
ACINETOBACTER BAUMANNII: NOT DETECTED
CANDIDA ALBICANS: NOT DETECTED
CANDIDA GLABRATA: NOT DETECTED
CANDIDA KRUSEI: NOT DETECTED
Candida parapsilosis: NOT DETECTED
Candida tropicalis: NOT DETECTED
Carbapenem resistance: NOT DETECTED
ENTEROBACTER CLOACAE COMPLEX: NOT DETECTED
ENTEROBACTERIACEAE SPECIES: DETECTED — AB
ESCHERICHIA COLI: DETECTED — AB
Enterococcus species: NOT DETECTED
Haemophilus influenzae: NOT DETECTED
KLEBSIELLA OXYTOCA: NOT DETECTED
KLEBSIELLA PNEUMONIAE: NOT DETECTED
Listeria monocytogenes: NOT DETECTED
Neisseria meningitidis: NOT DETECTED
PSEUDOMONAS AERUGINOSA: NOT DETECTED
Proteus species: NOT DETECTED
STREPTOCOCCUS PYOGENES: NOT DETECTED
STREPTOCOCCUS SPECIES: NOT DETECTED
Serratia marcescens: NOT DETECTED
Staphylococcus aureus (BCID): NOT DETECTED
Staphylococcus species: NOT DETECTED
Streptococcus agalactiae: NOT DETECTED
Streptococcus pneumoniae: NOT DETECTED

## 2018-03-24 LAB — CREATININE, SERUM
Creatinine, Ser: 0.84 mg/dL (ref 0.44–1.00)
GFR calc Af Amer: >60 mL/min (ref >60)
GFR calc non Af Amer: >60 mL/min (ref >60)

## 2018-03-24 LAB — RAPID URINE DRUG SCREEN, HOSP PERFORMED
AMPHETAMINES: NOT DETECTED
BENZODIAZEPINES: NOT DETECTED
Barbiturates: NOT DETECTED
COCAINE: POSITIVE — AB
Opiates: NOT DETECTED
Tetrahydrocannabinol: NOT DETECTED

## 2018-03-24 LAB — CBC
HEMATOCRIT: 38.7 % (ref 36.0–46.0)
Hemoglobin: 11.9 g/dL — ABNORMAL LOW (ref 12.0–15.0)
MCH: 29.5 pg (ref 26.0–34.0)
MCHC: 30.7 g/dL (ref 30.0–36.0)
MCV: 96 fL (ref 80.0–100.0)
Platelets: 546 10*3/uL — ABNORMAL HIGH (ref 150–400)
RBC: 4.03 MIL/uL (ref 3.87–5.11)
RDW: 14.7 % (ref 11.5–15.5)
WBC: 15.4 10*3/uL — ABNORMAL HIGH (ref 4.0–10.5)
nRBC: 0 % (ref 0.0–0.2)

## 2018-03-24 LAB — MRSA PCR SCREENING: MRSA BY PCR: POSITIVE — AB

## 2018-03-24 LAB — ECHOCARDIOGRAM COMPLETE
HEIGHTINCHES: 64 in
Weight: 4677.28 oz

## 2018-03-24 LAB — HEMOGLOBIN A1C
Hgb A1c MFr Bld: 5.6 % (ref 4.8–5.6)
Mean Plasma Glucose: 114.02 mg/dL

## 2018-03-24 LAB — HIV ANTIBODY (ROUTINE TESTING W REFLEX): HIV SCREEN 4TH GENERATION: NONREACTIVE

## 2018-03-24 MED ORDER — GABAPENTIN 300 MG PO CAPS
300.0000 mg | ORAL_CAPSULE | Freq: Two times a day (BID) | ORAL | Status: DC
  Administered 2018-03-24 – 2018-03-26 (×4): 300 mg via ORAL
  Filled 2018-03-24 (×4): qty 1

## 2018-03-24 MED ORDER — CHLORHEXIDINE GLUCONATE CLOTH 2 % EX PADS
6.0000 | MEDICATED_PAD | Freq: Every day | CUTANEOUS | Status: DC
  Administered 2018-03-24 – 2018-03-26 (×2): 6 via TOPICAL

## 2018-03-24 MED ORDER — HALOPERIDOL LACTATE 5 MG/ML IJ SOLN
4.0000 mg | Freq: Four times a day (QID) | INTRAMUSCULAR | Status: DC | PRN
  Administered 2018-03-24: 4 mg via INTRAVENOUS
  Filled 2018-03-24: qty 1

## 2018-03-24 MED ORDER — SODIUM CHLORIDE 0.9 % IV SOLN
2.0000 g | INTRAVENOUS | Status: DC
  Administered 2018-03-24 – 2018-03-26 (×3): 2 g via INTRAVENOUS
  Filled 2018-03-24: qty 2
  Filled 2018-03-24: qty 20
  Filled 2018-03-24: qty 2

## 2018-03-24 MED ORDER — ACETAMINOPHEN 325 MG PO TABS
650.0000 mg | ORAL_TABLET | Freq: Four times a day (QID) | ORAL | Status: DC | PRN
  Administered 2018-03-24 – 2018-03-26 (×5): 650 mg via ORAL
  Filled 2018-03-24 (×5): qty 2

## 2018-03-24 MED ORDER — MUPIROCIN 2 % EX OINT
1.0000 "application " | TOPICAL_OINTMENT | Freq: Two times a day (BID) | CUTANEOUS | Status: DC
  Administered 2018-03-25 – 2018-03-26 (×4): 1 via NASAL
  Filled 2018-03-24: qty 22

## 2018-03-24 MED ORDER — HALOPERIDOL LACTATE 5 MG/ML IJ SOLN: 5.0000 mg | Freq: Four times a day (QID) | INTRAMUSCULAR | Status: DC | PRN

## 2018-03-24 NOTE — Progress Notes (Signed)
  Echocardiogram 2D Echocardiogram has been performed.  Sarah Cervantes F 03/24/2018, 9:54 AM

## 2018-03-24 NOTE — Consult Note (Signed)
[facility] Consult   Reason for Consult: suicidal thoughts Referring Physician:  Dr. Olevia Bowens Patient Identification: Sarah Cervantes MRN:  [id number] Principal Diagnosis: Bipolar II disorder [facility]) Diagnosis:   Patient Active Problem List   Diagnosis Date Noted  . Cocaine abuse with cocaine-induced mood disorder ([place]) [F14.14] 03/24/2018  . Chronic post-traumatic stress disorder (PTSD) [F43.12] 03/24/2018  . Bipolar II disorder ([place]) [F31.81] 03/24/2018  . Sepsis, Gram negative ([place]) [A41.50] 03/23/2018  . Alcohol abuse with intoxication ([place]) [F10.129] 03/23/2018  . Polysubstance (excluding opioids) dependence ([place]) [F19.20] 03/23/2018    Total Time spent with patient: 45 minutes  Subjective:   Sarah Cervantes is a 37 y.o. female patient admitted with fever.  HPI: Patient who reports history of Polysubstance dependence(Cocaine, Heroin, Alcohol), PTSD and Bipolar disorder who was admitted to the hospital due to chest pain and fever. Patient reports that she has been trying to kill herself for the past one week by overdosing on a bunch of Tylenol, cocaine, heroin and Alcohol. She reports increasing mood swings, depression, loneliness, hopelessness and recurrent suicidal thoughts since her girlfriend left her. She reports that she has been non-compliant with her medications and has been self medicating with drugs. Patient is requesting for help from drugs and mental health. Patient is unable to contract for safety.  Past Psychiatric History: as above  Risk to Self:  yes Risk to Others:  denies Prior Inpatient Therapy:  yes Prior Outpatient Therapy:  yes  Past Medical History:  Past Medical History:  Diagnosis Date  . Cocaine abuse, daily use ([place]) 03/23/2018  . ETOH abuse 03/23/2018  . Heroin abuse ([place]) 03/23/2018   History reviewed. No pertinent surgical history. Family History: No family history on file. Family Psychiatric  History:  Social History:  Social  History   Substance and Sexual Activity  Alcohol Use Yes   Comment: Occ social drinker     Social History   Substance and Sexual Activity  Drug Use Yes   Comment: Heroin and Cocain on 2018/03/23. Pt uses every night    Social History   Socioeconomic History  . Marital status: Single    Spouse name: Not on file  . Number of children: Not on file  . Years of education: Not on file  . Highest education level: Not on file  Occupational History  . Not on file  Social Needs  . Financial resource strain: Not on file  . Food insecurity:    Worry: Not on file    Inability: Not on file  . Transportation needs:    Medical: Not on file    Non-medical: Not on file  Tobacco Use  . Smoking status: Current Every Day Smoker    Packs/day: 1.00    Years: 30.00    Pack years: 30.00  . Smokeless tobacco: Never Used  Substance and Sexual Activity  . Alcohol use: Yes    Comment: Occ social drinker  . Drug use: Yes    Comment: Heroin and Cocain on 2018/03/23. Pt uses every night  . Sexual activity: Yes    Birth control/protection: None  Lifestyle  . Physical activity:    Days per week: Not on file    Minutes per session: Not on file  . Stress: Not on file  Relationships  . Social connections:    Talks on phone: Not on file    Gets together: Not on file    Attends religious service: Not on file    Active member of club  or organization: Not on file    Attends meetings of clubs or organizations: Not on file    Relationship status: Not on file  Other Topics Concern  . Not on file  Social History Narrative  . Not on file   Additional Social History:    Allergies:   Allergies  Allergen Reactions  . Latex Swelling and Rash    Hands swell    Labs:  Results for orders placed or performed during the hospital encounter of 03/23/18 (from the past 48 hour(s))  Comprehensive metabolic panel     Status: Abnormal   Collection Time: 03/23/18  2:11 PM  Result Value Ref Range    Sodium 139 135 - 145 mmol/L   Potassium 3.7 3.5 - 5.1 mmol/L   Chloride 108 98 - 111 mmol/L   CO2 23 22 - 32 mmol/L   Glucose, Bld 106 (H) 70 - 99 mg/dL   BUN 8 6 - 20 mg/dL   Creatinine, Ser 0.82 0.44 - 1.00 mg/dL   Calcium 8.1 (L) 8.9 - 10.3 mg/dL   Total Protein 7.1 6.5 - 8.1 g/dL   Albumin 2.9 (L) 3.5 - 5.0 g/dL   AST 14 (L) 15 - 41 U/L   ALT 16 0 - 44 U/L   Alkaline Phosphatase 59 38 - 126 U/L   Total Bilirubin 0.9 0.3 - 1.2 mg/dL   GFR calc non Af Amer >60 >60 mL/min   GFR calc Af Amer >60 >60 mL/min    Comment: (NOTE) The eGFR has been calculated using the CKD EPI equation. This calculation has not been validated in all clinical situations. eGFR's persistently <60 mL/min signify possible Chronic Kidney Disease.    Anion gap 8 5 - 15    Comment: Performed at [facility], [place] [address], [place], [place] [address]  CBC WITH DIFFERENTIAL     Status: Abnormal   Collection Time: 03/23/18  2:11 PM  Result Value Ref Range   WBC 18.3 (H) 4.0 - 10.5 K/uL   RBC 4.26 3.87 - 5.11 MIL/uL   Hemoglobin 12.7 12.0 - 15.0 g/dL   HCT 40.1 36.0 - 46.0 %   MCV 94.1 80.0 - 100.0 fL   MCH 29.8 26.0 - 34.0 pg   MCHC 31.7 30.0 - 36.0 g/dL   RDW 14.8 11.5 - 15.5 %   Platelets 559 (H) 150 - 400 K/uL   nRBC 0.0 0.0 - 0.2 %   Neutrophils Relative % 84 %   Neutro Abs 15.1 (H) 1.7 - 7.7 K/uL   Lymphocytes Relative 9 %   Lymphs Abs 1.7 0.7 - 4.0 K/uL   Monocytes Relative 7 %   Monocytes Absolute 1.3 (H) 0.1 - 1.0 K/uL   Eosinophils Relative 0 %   Eosinophils Absolute 0.0 0.0 - 0.5 K/uL   Basophils Relative 0 %   Basophils Absolute 0.1 0.0 - 0.1 K/uL   Immature Granulocytes 0 %   Abs Immature Granulocytes 0.08 (H) 0.00 - 0.07 K/uL    Comment: Performed at [facility], [place] [address] [place], [place] [address]  Blood Culture (routine x 2)     Status: None (Preliminary result)   Collection Time: 03/23/18  2:24 PM  Result Value Ref Range   Specimen  Description      BLOOD LEFT HAND Performed at [place] [address] [place], [place] [address]    Special Requests      BAA BCAV Performed at [facility]  [place] [address], [place], [place] [address]    Culture  Setup Time      GRAM NEGATIVE RODS ANAEROBIC BOTTLE ONLY CRITICAL VALUE NOTED.  VALUE IS CONSISTENT WITH PREVIOUSLY REPORTED AND CALLED VALUE. Performed at [facility], [place] [address] [place], [place] [address]    Culture GRAM NEGATIVE RODS    Report Status PENDING   Blood Culture (routine x 2)     Status: None (Preliminary result)   Collection Time: 03/23/18  2:24 PM  Result Value Ref Range   Specimen Description      BLOOD RIGHT ARM Performed at [place] [address] [place], [place] [address]    Special Requests      BAA BCAV Performed at [place] [address] [place], [place] [address]    Culture  Setup Time      GRAM NEGATIVE RODS IN BOTH AEROBIC AND ANAEROBIC BOTTLES CRITICAL RESULT CALLED TO, READ BACK BY AND VERIFIED WITH: PHARMD L POINDEXTER [id number] MLM Performed at [facility], [place] [address], [place], [place] [address]    Culture GRAM NEGATIVE RODS    Report Status PENDING   Blood Culture ID Panel (Reflexed)     Status: Abnormal   Collection Time: 03/23/18  2:24 PM  Result Value Ref Range   Enterococcus species NOT DETECTED NOT DETECTED   Listeria monocytogenes NOT DETECTED NOT DETECTED   Staphylococcus species NOT DETECTED NOT DETECTED   Staphylococcus aureus (BCID) NOT DETECTED NOT DETECTED   Streptococcus species NOT DETECTED NOT DETECTED   Streptococcus agalactiae NOT DETECTED NOT DETECTED   Streptococcus pneumoniae NOT DETECTED NOT DETECTED   Streptococcus pyogenes NOT DETECTED NOT DETECTED   Acinetobacter baumannii NOT DETECTED NOT DETECTED   Enterobacteriaceae species DETECTED (A) NOT DETECTED    Comment: Enterobacteriaceae  represent a large family of gram-negative bacteria, not a single organism. CRITICAL RESULT CALLED TO, READ BACK BY AND VERIFIED WITH: PHARMD L POINDEXTER [id number] MLM    Enterobacter cloacae complex NOT DETECTED NOT DETECTED   Escherichia coli DETECTED (A) NOT DETECTED    Comment: CRITICAL RESULT CALLED TO, READ BACK BY AND VERIFIED WITH: PHARMD L POINDEXTER [phone] MLM    Klebsiella oxytoca NOT DETECTED NOT DETECTED   Klebsiella pneumoniae NOT DETECTED NOT DETECTED   Proteus species NOT DETECTED NOT DETECTED   Serratia marcescens NOT DETECTED NOT DETECTED   Carbapenem resistance NOT DETECTED NOT DETECTED   Haemophilus influenzae NOT DETECTED NOT DETECTED   Neisseria meningitidis NOT DETECTED NOT DETECTED   Pseudomonas aeruginosa NOT DETECTED NOT DETECTED   Candida albicans NOT DETECTED NOT DETECTED   Candida glabrata NOT DETECTED NOT DETECTED   Candida krusei NOT DETECTED NOT DETECTED   Candida parapsilosis NOT DETECTED NOT DETECTED   Candida tropicalis NOT DETECTED NOT DETECTED    Comment: Performed at [place] [address], [place], [place] [address]  Urinalysis, Routine w reflex microscopic     Status: Abnormal   Collection Time: 03/23/18  2:28 PM  Result Value Ref Range   Color, Urine YELLOW YELLOW   APPearance CLEAR CLEAR   Specific Gravity, Urine 1.005 1.005 - 1.030   pH 7.0 5.0 - 8.0   Glucose, UA NEGATIVE NEGATIVE mg/dL   Hgb urine dipstick SMALL (A) NEGATIVE   Bilirubin Urine NEGATIVE NEGATIVE   Ketones, ur NEGATIVE NEGATIVE mg/dL   Protein, ur NEGATIVE NEGATIVE mg/dL   Nitrite NEGATIVE NEGATIVE  Leukocytes, UA SMALL (A) NEGATIVE   RBC / HPF 0-5 0 - 5 RBC/hpf   WBC, UA 6-10 0 - 5 WBC/hpf   Bacteria, UA RARE (A) NONE SEEN   Squamous Epithelial / LPF 0-5 0 - 5    Comment: Performed at [facility], [place] [address] [place], [place] [address]  I-Stat CG4 Lactic Acid, ED  (not at  [facility])     Status: None   Collection Time:  03/23/18  2:35 PM  Result Value Ref Range   Lactic Acid, Venous 0.97 0.5 - 1.9 mmol/L  Basic metabolic panel     Status: Abnormal   Collection Time: 03/23/18  6:43 PM  Result Value Ref Range   Sodium 139 135 - 145 mmol/L   Potassium 3.8 3.5 - 5.1 mmol/L   Chloride 108 98 - 111 mmol/L   CO2 24 22 - 32 mmol/L   Glucose, Bld 96 70 - 99 mg/dL   BUN 9 6 - 20 mg/dL   Creatinine, Ser 0.85 0.44 - 1.00 mg/dL   Calcium 8.0 (L) 8.9 - 10.3 mg/dL   GFR calc non Af Amer >60 >60 mL/min   GFR calc Af Amer >60 >60 mL/min    Comment: (NOTE) The eGFR has been calculated using the CKD EPI equation. This calculation has not been validated in all clinical situations. eGFR's persistently <60 mL/min signify possible Chronic Kidney Disease.    Anion gap 7 5 - 15    Comment: Performed at [facility], [place] [address], [place], [place] [address]  Ethanol     Status: None   Collection Time: 03/23/18  6:43 PM  Result Value Ref Range   Alcohol, Ethyl (B) <10 <10 mg/dL    Comment: (NOTE) Lowest detectable limit for serum alcohol is 10 mg/dL. For medical purposes only. Performed at [facility], [place] [address] [place], [place] [address]   MRSA PCR Screening     Status: Abnormal   Collection Time: 03/24/18  2:59 AM  Result Value Ref Range   MRSA by PCR POSITIVE (A) NEGATIVE    Comment:        The GeneXpert MRSA Assay (FDA approved for NASAL specimens only), is one component of a comprehensive MRSA colonization surveillance program. It is not intended to diagnose MRSA infection nor to guide or monitor treatment for MRSA infections. RESULT CALLED TO, READ BACK BY AND VERIFIED WITH: E CAUDLE,RN 03/24/18 0445 RHOLMES Performed at [facility], [place] [address], [place], [place] [address]   Urine rapid drug screen (hosp performed)     Status: Abnormal   Collection Time: 03/24/18  2:59 AM  Result Value Ref Range   Opiates NONE DETECTED NONE  DETECTED   Cocaine POSITIVE (A) NONE DETECTED   Benzodiazepines NONE DETECTED NONE DETECTED   Amphetamines NONE DETECTED NONE DETECTED   Tetrahydrocannabinol NONE DETECTED NONE DETECTED   Barbiturates NONE DETECTED NONE DETECTED    Comment: (NOTE) DRUG SCREEN FOR MEDICAL PURPOSES ONLY.  IF CONFIRMATION IS NEEDED FOR ANY PURPOSE, NOTIFY LAB WITHIN 5 DAYS. LOWEST DETECTABLE LIMITS FOR URINE DRUG SCREEN Drug Class                     Cutoff (ng/mL) Amphetamine and metabolites    1000 Barbiturate and metabolites    200 Benzodiazepine                 659 Tricyclics and metabolites  300 Opiates and metabolites        300 Cocaine and metabolites        300 THC                            50 Performed at [place] [address], [place], [place] [address]   CBC     Status: Abnormal   Collection Time: 03/24/18  7:22 AM  Result Value Ref Range   WBC 15.4 (H) 4.0 - 10.5 K/uL   RBC 4.03 3.87 - 5.11 MIL/uL   Hemoglobin 11.9 (L) 12.0 - 15.0 g/dL   HCT 38.7 36.0 - 46.0 %   MCV 96.0 80.0 - 100.0 fL   MCH 29.5 26.0 - 34.0 pg   MCHC 30.7 30.0 - 36.0 g/dL   RDW 14.7 11.5 - 15.5 %   Platelets 546 (H) 150 - 400 K/uL   nRBC 0.0 0.0 - 0.2 %    Comment: Performed at [facility], [place] [address], [place], [place] [address]  Creatinine, serum     Status: None   Collection Time: 03/24/18  7:22 AM  Result Value Ref Range   Creatinine, Ser 0.84 0.44 - 1.00 mg/dL   GFR calc non Af Amer >60 >60 mL/min   GFR calc Af Amer >60 >60 mL/min    Comment: (NOTE) The eGFR has been calculated using the CKD EPI equation. This calculation has not been validated in all clinical situations. eGFR's persistently <60 mL/min signify possible Chronic Kidney Disease. Performed at [facility], [place] [address], [place], [place] [address]     Current Facility-Administered Medications  Medication Dose Route Frequency Provider Last Rate Last Dose  . 0.9  %  sodium chloride infusion   Intravenous Continuous Georgette Shell, MD 150 mL/hr at 03/24/18 0240    . acetaminophen (TYLENOL) tablet 650 mg  650 mg Oral Q6H PRN Lovey Newcomer T, NP   650 mg at 03/24/18 0332  . cefTRIAXone (ROCEPHIN) 2 g in sodium chloride 0.9 % 100 mL IVPB  2 g Intravenous Q24H Charlynne Cousins, MD      . Chlorhexidine Gluconate Cloth 2 % PADS 6 each  6 each Topical Q0600 Georgette Shell, MD   6 each at 03/24/18 0539  . enoxaparin (LOVENOX) injection 60 mg  60 mg Subcutaneous Q24H Georgette Shell, MD   60 mg at 03/23/18 2053  . folic acid (FOLVITE) tablet 1 mg  1 mg Oral Daily Georgette Shell, MD   1 mg at 03/23/18 2000  . haloperidol lactate (HALDOL) injection 5 mg  5 mg Intravenous Q6H PRN Charlynne Cousins, MD      . multivitamin with minerals tablet 1 tablet  1 tablet Oral Daily Georgette Shell, MD   1 tablet at 03/23/18 2059  . mupirocin ointment (BACTROBAN) 2 % 1 application  1 application Nasal BID Georgette Shell, MD      . thiamine (VITAMIN B-1) tablet 100 mg  100 mg Oral Daily Georgette Shell, MD   100 mg at 03/23/18 2054   Or  . thiamine (B-1) injection 100 mg  100 mg Intravenous Daily Georgette Shell, MD        Musculoskeletal: Strength & Muscle Tone: within normal limits Gait & Station: normal Patient leans: N/A  Psychiatric Specialty Exam: Physical Exam  Psychiatric: Her speech is normal. Her affect is labile. She is  agitated and aggressive. Cognition and memory are normal. She expresses impulsivity. She exhibits a depressed mood. She expresses suicidal ideation. She expresses suicidal plans.    Review of Systems  Constitutional: Negative.   HENT: Negative.   Eyes: Negative.   Respiratory: Negative.   Cardiovascular: Negative.   Gastrointestinal: Negative.   Genitourinary: Negative.   Musculoskeletal: Negative.   Skin: Negative.   Neurological: Negative.   Endo/Heme/Allergies: Negative.    Psychiatric/Behavioral: Positive for substance abuse and suicidal ideas. The patient is nervous/anxious.     Blood pressure (!) 165/106, pulse 80, temperature 99.5 F (37.5 C), temperature source Oral, resp. rate (!) 21, height 5' 4"  (1.626 m), weight 132.6 kg, last menstrual period 03/23/2018, SpO2 98 %.Body mass index is 50.18 kg/m.  General Appearance: Casual  Eye Contact:  Good  Speech:  Clear and Coherent  Volume:  Normal  Mood:  Irritable  Affect:  Labile  Thought Process:  Coherent and Linear  Orientation:  Full (Time, Place, and Person)  Thought Content:  Logical  Suicidal Thoughts:  Yes.  with intent/plan  Homicidal Thoughts:  No  Memory:  Immediate;   Good Recent;   Good Remote;   Good  Judgement:  Poor  Insight:  Shallow  Psychomotor Activity:  Increased  Concentration:  Concentration: Fair and Attention Span: Fair  Recall:  Good  Fund of Knowledge:  Good  Language:  Good  Akathisia:  No  Handed:  Right  AIMS (if indicated):     Assets:  Communication Skills Desire for Improvement  ADL's:  Intact  Cognition:  WNL  Sleep:   poor     Treatment Plan Summary: 37 y/o female with history of polysubstance dependence and mental illness dating back to age 45. Patient was admitted due to fever and chest pain following Cocaine binge. She is reporting recurrent suicidal ideations with plan to overdose.   Recommendations: -Continue 1:1 sitter for safety. -Consider Gabapentin 300 mg bid for alcohol withdrawal/cocaine/mood -Consider Social worker consult to facilitate inpatient psychiatric hospital admission for stabilization -Psychiatric service signing out. Re-consult psych as needed   Disposition: Recommend psychiatric Inpatient admission when medically cleared. Supportive therapy provided about ongoing stressors.  Corena Pilgrim, MD 03/24/2018 12:38 PM

## 2018-03-24 NOTE — Progress Notes (Signed)
PHARMACY - PHYSICIAN COMMUNICATION CRITICAL VALUE ALERT - BLOOD CULTURE IDENTIFICATION (BCID)  Dave Mannes is an 37 y.o. female who presented [facility] on 03/23/2018 with a chief complaint of fever, sepsis  Assessment:  Ecoli bacteremia  Name of physician (or Provider) ContactedVenetia Constable  Current antibiotics: cefepime 2g q8  Changes to prescribed antibiotics recommended:  Rocephin 2g q24  Results for orders placed or performed during the hospital encounter of 03/23/18  Blood Culture ID Panel (Reflexed) (Collected: 03/23/2018  2:24 PM)  Result Value Ref Range   Enterococcus species NOT DETECTED NOT DETECTED   Listeria monocytogenes NOT DETECTED NOT DETECTED   Staphylococcus species NOT DETECTED NOT DETECTED   Staphylococcus aureus (BCID) NOT DETECTED NOT DETECTED   Streptococcus species NOT DETECTED NOT DETECTED   Streptococcus agalactiae NOT DETECTED NOT DETECTED   Streptococcus pneumoniae NOT DETECTED NOT DETECTED   Streptococcus pyogenes NOT DETECTED NOT DETECTED   Acinetobacter baumannii NOT DETECTED NOT DETECTED   Enterobacteriaceae species DETECTED (A) NOT DETECTED   Enterobacter cloacae complex NOT DETECTED NOT DETECTED   Escherichia coli DETECTED (A) NOT DETECTED   Klebsiella oxytoca NOT DETECTED NOT DETECTED   Klebsiella pneumoniae NOT DETECTED NOT DETECTED   Proteus species NOT DETECTED NOT DETECTED   Serratia marcescens NOT DETECTED NOT DETECTED   Carbapenem resistance NOT DETECTED NOT DETECTED   Haemophilus influenzae NOT DETECTED NOT DETECTED   Neisseria meningitidis NOT DETECTED NOT DETECTED   Pseudomonas aeruginosa NOT DETECTED NOT DETECTED   Candida albicans NOT DETECTED NOT DETECTED   Candida glabrata NOT DETECTED NOT DETECTED   Candida krusei NOT DETECTED NOT DETECTED   Candida parapsilosis NOT DETECTED NOT DETECTED   Candida tropicalis NOT DETECTED NOT DETECTED    Kara Mead 03/24/2018  9:41 AM

## 2018-03-24 NOTE — Progress Notes (Addendum)
[facility] PROGRESS NOTE    Progress Note  Antara Brecheisen  TSV:[id number] DOB: November 18, 1980 DOA: 03/23/2018 PCP: Patient, No Pcp Per     Brief Narrative:   Sarah Cervantes is an 37 y.o. female past medical history of cocaine and heroin comes in complaining of a temperature of 103.0 with nausea vomiting and diarrhea, she does complain of some hematochezia.  Assessment/Plan:   Sepsis, Gram negative ([place]): Fever a white count and with positive blood cultures were gram-negative rods. We will continue IV Rocephin. Urine cultures are pending. There are no signs of withdrawal discontinue Ativan. Use haldol for agitation. 2D echo results are pending. Patient has a very manipulative personality, she expressed suicidal thoughts. She is now denying hematochezia her hemoglobin seems to be at baseline.  Repeat a CBC tomorrow morning. When I was in the room patient was expressing very manipulative behavior complaining about symptoms that she had not talked anybody else throughout her hospital stay.  She relates they are not listening. When I confronted her with this she got mad and said that I was laughing from her.  I told her that I cannot take care of her if she keeps being tangential on her answers, which I think she is doing intentionally.  As every time she gets press about her symptoms she will get agitated and mad.  Suicidal thoughts: I will IVC her consult psychiatry for suicidal thoughts she does have a plan she relates she wants to kill herself with pills.  Alcohol abuse with intoxication (HCC)/Polysubstance (excluding opioids) dependence (HCC) Discontinue Ativan.  She has no signs of withdrawal.  Elevated blood pressure without a diagnosis of hypertension: We will place her on a low-sodium diet. Check A1c   DVT prophylaxis: SCD's Family Communication:None Disposition Plan/Barrier to D/C:   Code Status:     Code Status Orders  (From admission, onward)        Start     Ordered   03/23/18 1640  Full code  Continuous     03/23/18 1639        Code Status History    This patient has a current code status but no historical code status.        IV Access:    Peripheral IV   Procedures and diagnostic studies:   Dg Chest Port 1 View  Result Date: 03/23/2018 CLINICAL DATA:  Pt. admits to snorting cocaine and using heroin last night. No known heart or lung conditions. EXAM: PORTABLE CHEST 1 VIEW COMPARISON:  None. FINDINGS: The heart size and mediastinal contours are within normal limits. Both lungs are clear. The visualized skeletal structures are unremarkable. IMPRESSION: No active disease. Electronically Signed   By: Nolon Nations M.D.   On: 03/23/2018 14:31     Medical Consultants:    None.  Anti-Infectives:   Cafepime  Subjective:    Sarah Cervantes she is complaining of pain, fever, suicidal thought, back pain, diarhea, hematochezia, tired, weakness, people being mean to her.  Objective:    Vitals:   03/23/18 2117 03/23/18 2126 03/24/18 0530 03/24/18 0541  BP: (!) 166/103 (!) 164/92 (!) 143/93   Pulse: 83 75 81   Resp: (!) 28 20 (!) 28 20  Temp: 97.7 F (36.5 C)  99.9 F (37.7 C)   TempSrc: Oral  Oral   SpO2: 100% 100% 97%   Weight:      Height:        Intake/Output Summary (Last 24 hours) at 03/24/2018 9233 Last data filed  at 03/24/2018 0745 Gross per 24 hour  Intake 2051.97 ml  Output -  Net 2051.97 ml   Filed Weights   03/23/18 1315 03/23/18 1741  Weight: 131.5 kg 132.6 kg    Exam: General exam: In no acute distress. Respiratory system: Good air movement and clear to auscultation. Cardiovascular system: S1 & S2 heard, RRR.  Gastrointestinal system: Abdomen is nondistended, soft and nontender.  Central nervous system: Alert and oriented. No focal neurological deficits. Extremities: No pedal edema. Skin: No rashes, lesions or ulcers Psychiatry: Judgement and insight appear normal. Mood &  affect appropriate.    Data Reviewed:    Labs: Basic Metabolic Panel: Recent Labs  Lab 03/23/18 1411 03/23/18 1843  NA 139 139  K 3.7 3.8  CL 108 108  CO2 23 24  GLUCOSE 106* 96  BUN 8 9  CREATININE 0.82 0.85  CALCIUM 8.1* 8.0*   GFR Estimated Creatinine Clearance: 120.5 mL/min (by C-G formula based on SCr of 0.85 mg/dL). Liver Function Tests: Recent Labs  Lab 03/23/18 1411  AST 14*  ALT 16  ALKPHOS 59  BILITOT 0.9  PROT 7.1  ALBUMIN 2.9*   No results for input(s): LIPASE, AMYLASE in the last 168 hours. No results for input(s): AMMONIA in the last 168 hours. Coagulation profile No results for input(s): INR, PROTIME in the last 168 hours.  CBC: Recent Labs  Lab 03/23/18 1411  WBC 18.3*  NEUTROABS 15.1*  HGB 12.7  HCT 40.1  MCV 94.1  PLT 559*   Cardiac Enzymes: No results for input(s): CKTOTAL, CKMB, CKMBINDEX, TROPONINI in the last 168 hours. BNP (last 3 results) No results for input(s): PROBNP in the last 8760 hours. CBG: No results for input(s): GLUCAP in the last 168 hours. D-Dimer: No results for input(s): DDIMER in the last 72 hours. Hgb A1c: No results for input(s): HGBA1C in the last 72 hours. Lipid Profile: No results for input(s): CHOL, HDL, LDLCALC, TRIG, CHOLHDL, LDLDIRECT in the last 72 hours. Thyroid function studies: No results for input(s): TSH, T4TOTAL, T3FREE, THYROIDAB in the last 72 hours.  Invalid input(s): FREET3 Anemia work up: No results for input(s): VITAMINB12, FOLATE, FERRITIN, TIBC, IRON, RETICCTPCT in the last 72 hours. Sepsis Labs: Recent Labs  Lab 03/23/18 1411 03/23/18 1435  WBC 18.3*  --   LATICACIDVEN  --  0.97   Microbiology Recent Results (from the past 240 hour(s))  Blood Culture (routine x 2)     Status: None (Preliminary result)   Collection Time: 03/23/18  2:24 PM  Result Value Ref Range Status   Specimen Description   Final    BLOOD LEFT HAND Performed at [facility], [place]  [address], [place], [place] [address]    Special Requests   Final    BAA BCAV Performed at [facility] [address], [place], [place] [address]    Culture  Setup Time   Final    GRAM NEGATIVE RODS ANAEROBIC BOTTLE ONLY CRITICAL VALUE NOTED.  VALUE IS CONSISTENT WITH PREVIOUSLY REPORTED AND CALLED VALUE. Performed at [facility], [place] [address] [place], [place] [address]    Culture GRAM NEGATIVE RODS  Final   Report Status PENDING  Incomplete  Blood Culture (routine x 2)     Status: None (Preliminary result)   Collection Time: 03/23/18  2:24 PM  Result Value Ref Range Status   Specimen Description   Final    BLOOD RIGHT ARM Performed at [facility],  [place] [address] [place], [place] [address]    Special Requests   Final    BAA BCAV Performed at [facility] [address], [place], [place] [address]    Culture  Setup Time   Final    GRAM NEGATIVE RODS IN BOTH AEROBIC AND ANAEROBIC BOTTLES Organism ID to follow Performed at [facility], [place] [address], [place],  [address]    Culture GRAM NEGATIVE RODS  Final   Report Status PENDING  Incomplete  MRSA PCR Screening     Status: Abnormal   Collection Time: 03/24/18  2:59 AM  Result Value Ref Range Status   MRSA by PCR POSITIVE (A) NEGATIVE Final    Comment:        The GeneXpert MRSA Assay (FDA approved for NASAL specimens only), is one component of a comprehensive MRSA colonization surveillance program. It is not intended to diagnose MRSA infection nor to guide or monitor treatment for MRSA infections. RESULT CALLED TO, READ BACK BY AND VERIFIED WITH: E CAUDLE,RN 03/24/18 0445 RHOLMES Performed at [facility], [place] [address] [place],  [address]      Medications:   . Chlorhexidine Gluconate Cloth  6 each Topical Q0600  . enoxaparin (LOVENOX) injection  60 mg Subcutaneous Q24H  . folic acid  1 mg Oral Daily    . multivitamin with minerals  1 tablet Oral Daily  . mupirocin ointment  1 application Nasal BID  . thiamine  100 mg Oral Daily   Or  . thiamine  100 mg Intravenous Daily   Continuous Infusions: . sodium chloride 150 mL/hr at 03/24/18 0240  . ceFEPime (MAXIPIME) IV 2 g (03/24/18 0536)  . metronidazole 500 mg (03/23/18 2055)      LOS: 1 day   Charlynne Cousins  [facility] Pager [phone]  *Please refer to [email or web address], password [email or web address] to get updated schedule on who will round on this patient, as hospitalists switch teams weekly. If 7PM-7AM, please contact night-coverage at [email or web address], password [email or web address] for any overnight needs.  03/24/2018, 8:36 AM

## 2018-03-25 ENCOUNTER — Encounter (HOSPITAL_COMMUNITY): Payer: Self-pay | Admitting: Radiology

## 2018-03-25 ENCOUNTER — Inpatient Hospital Stay (HOSPITAL_COMMUNITY): Payer: Self-pay

## 2018-03-25 DIAGNOSIS — R7881 Bacteremia: Secondary | ICD-10-CM

## 2018-03-25 LAB — CBC
HCT: 39.2 % (ref 36.0–46.0)
Hemoglobin: 12.3 g/dL (ref 12.0–15.0)
MCH: 29.7 pg (ref 26.0–34.0)
MCHC: 31.4 g/dL (ref 30.0–36.0)
MCV: 94.7 fL (ref 80.0–100.0)
PLATELETS: 602 10*3/uL — AB (ref 150–400)
RBC: 4.14 MIL/uL (ref 3.87–5.11)
RDW: 14.5 % (ref 11.5–15.5)
WBC: 13.6 10*3/uL — AB (ref 4.0–10.5)
nRBC: 0 % (ref 0.0–0.2)

## 2018-03-25 LAB — CREATININE, SERUM: Creatinine, Ser: 0.78 mg/dL (ref 0.44–1.00)

## 2018-03-25 MED ORDER — SODIUM CHLORIDE 0.9 % IJ SOLN
INTRAMUSCULAR | Status: AC
  Filled 2018-03-25: qty 50

## 2018-03-25 MED ORDER — HYDRALAZINE HCL 20 MG/ML IJ SOLN: 5.0000 mg | Freq: Four times a day (QID) | INTRAMUSCULAR | Status: DC | PRN

## 2018-03-25 MED ORDER — POLYETHYLENE GLYCOL 3350 17 G PO PACK
17.0000 g | PACK | Freq: Every day | ORAL | Status: DC
  Administered 2018-03-25 – 2018-03-26 (×2): 17 g via ORAL
  Filled 2018-03-25 (×2): qty 1

## 2018-03-25 MED ORDER — ONDANSETRON HCL 4 MG/2ML IJ SOLN: 4.0000 mg | Freq: Four times a day (QID) | INTRAMUSCULAR | Status: DC | PRN

## 2018-03-25 MED ORDER — IOHEXOL 300 MG/ML  SOLN
75.0000 mL | Freq: Once | INTRAMUSCULAR | Status: AC | PRN
  Administered 2018-03-25: 75 mL via INTRAVENOUS

## 2018-03-25 NOTE — Progress Notes (Signed)
[facility] PROGRESS NOTE    Progress Note  Sarah Cervantes  IFO:[id number] DOB: Oct 06, 1980 DOA: 03/23/2018 PCP: Patient, No Pcp Per     Brief Narrative:   Sarah Cervantes is an 37 y.o. female past medical history of cocaine and heroin comes in complaining of a temperature of 103.0 with nausea vomiting and diarrhea, she does complain of some hematochezia.  Assessment/Plan:   Sepsis due to gram-negative bacteremia. Blood cultures were + E. Coli, she is complaining of mouth pain especially when she chews she relates 1 of her tooth hurts and will get a CT maxillofacial. We will continue IV Rocephin. Urine cultures are pending. Use haldol for agitation. 2D echo results: 55% no vegetation. She is defervesced and her leukocytosis is improving.  Suicidal thoughts: Consulted psychiatry they recommended inpatient psych admissions after has been cleared from the medical standpoint.  Alcohol abuse with intoxication (HCC)/Polysubstance (excluding opioids) dependence (HCC) No signs of withdrawal continue gabapentin.  Elevated blood pressure without a diagnosis of hypertension:    DVT prophylaxis: SCD's Family Communication:None Disposition Plan/Barrier to D/C: to inpatient psyq when cleared medically.  Code Status:     Code Status Orders  (From admission, onward)         Start     Ordered   03/23/18 1640  Full code  Continuous     03/23/18 1639        Code Status History    This patient has a current code status but no historical code status.        IV Access:    Peripheral IV   Procedures and diagnostic studies:   Dg Chest Port 1 View  Result Date: 03/23/2018 CLINICAL DATA:  Pt. admits to snorting cocaine and using heroin last night. No known heart or lung conditions. EXAM: PORTABLE CHEST 1 VIEW COMPARISON:  None. FINDINGS: The heart size and mediastinal contours are within normal limits. Both lungs are clear. The visualized skeletal structures are  unremarkable. IMPRESSION: No active disease. Electronically Signed   By: Nolon Nations M.D.   On: 03/23/2018 14:31     Medical Consultants:    None.  Anti-Infectives:   Cafepime  Subjective:    Sarah Cervantes she is in a better mood today, not threatening and not screaming or swearing. She relates she feels much better than yesterday.  Objective:    Vitals:   03/24/18 2300 03/25/18 0155 03/25/18 0204 03/25/18 0546  BP: (!) 178/106 (!) 166/106 (!) 146/100 136/86  Pulse: 78 78  67  Resp: 20   18  Temp: 98.5 F (36.9 C)   98.3 F (36.8 C)  TempSrc: Oral   Oral  SpO2: 98% 99%  97%  Weight:      Height:        Intake/Output Summary (Last 24 hours) at 03/25/2018 0904 Last data filed at 03/25/2018 0835 Gross per 24 hour  Intake 4936.35 ml  Output 600 ml  Net 4336.35 ml   Filed Weights   03/23/18 1315 03/23/18 1741  Weight: 131.5 kg 132.6 kg    Exam: General exam: In no acute distress. Respiratory system: Good air movement and clear to auscultation. Cardiovascular system: S1 & S2 heard, RRR.  Gastrointestinal system: Abdomen is nondistended, soft and nontender.  Central nervous system: Alert and oriented. No focal neurological deficits. Extremities: No pedal edema. Skin: No rashes, lesions or ulcers Psychiatry: Judgement and insight appear normal. Mood & affect appropriate.    Data Reviewed:    Labs: Basic Metabolic Panel:  Recent Labs  Lab 03/23/18 1411 03/23/18 1843 03/24/18 0722  NA 139 139  --   K 3.7 3.8  --   CL 108 108  --   CO2 23 24  --   GLUCOSE 106* 96  --   BUN 8 9  --   CREATININE 0.82 0.85 0.84  CALCIUM 8.1* 8.0*  --    GFR Estimated Creatinine Clearance: 121.9 mL/min (by C-G formula based on SCr of 0.84 mg/dL). Liver Function Tests: Recent Labs  Lab 03/23/18 1411  AST 14*  ALT 16  ALKPHOS 59  BILITOT 0.9  PROT 7.1  ALBUMIN 2.9*   No results for input(s): LIPASE, AMYLASE in the last 168 hours. No results for  input(s): AMMONIA in the last 168 hours. Coagulation profile No results for input(s): INR, PROTIME in the last 168 hours.  CBC: Recent Labs  Lab 03/23/18 1411 03/24/18 0722 03/25/18 0753  WBC 18.3* 15.4* 13.6*  NEUTROABS 15.1*  --   --   HGB 12.7 11.9* 12.3  HCT 40.1 38.7 39.2  MCV 94.1 96.0 94.7  PLT 559* 546* 602*   Cardiac Enzymes: No results for input(s): CKTOTAL, CKMB, CKMBINDEX, TROPONINI in the last 168 hours. BNP (last 3 results) No results for input(s): PROBNP in the last 8760 hours. CBG: No results for input(s): GLUCAP in the last 168 hours. D-Dimer: No results for input(s): DDIMER in the last 72 hours. Hgb A1c: Recent Labs    03/24/18 0810  HGBA1C 5.6   Lipid Profile: No results for input(s): CHOL, HDL, LDLCALC, TRIG, CHOLHDL, LDLDIRECT in the last 72 hours. Thyroid function studies: No results for input(s): TSH, T4TOTAL, T3FREE, THYROIDAB in the last 72 hours.  Invalid input(s): FREET3 Anemia work up: No results for input(s): VITAMINB12, FOLATE, FERRITIN, TIBC, IRON, RETICCTPCT in the last 72 hours. Sepsis Labs: Recent Labs  Lab 03/23/18 1411 03/23/18 1435 03/24/18 0722 03/25/18 0753  WBC 18.3*  --  15.4* 13.6*  LATICACIDVEN  --  0.97  --   --    Microbiology Recent Results (from the past 240 hour(s))  Culture, blood (single)     Status: None (Preliminary result)   Collection Time: 03/23/18  2:01 PM  Result Value Ref Range Status   Specimen Description   Final    BLOOD RIGHT HAND Performed at [facility], [place] [address] [place], [place] [address]    Special Requests   Final    BAA BCAV Performed at [facility] [address] [place], [place] [address]    Culture   Final    NO GROWTH < 24 HOURS Performed at [facility] [address], [place], [place] [address]    Report Status PENDING  Incomplete  Blood Culture (routine x 2)     Status: None (Preliminary result)   Collection Time:  03/23/18  2:24 PM  Result Value Ref Range Status   Specimen Description   Final    BLOOD LEFT HAND Performed at [facility] [address], [place], [place] [address]    Special Requests   Final    BAA BCAV Performed at [facility] [address] [place],  [address]    Culture  Setup Time   Final    GRAM NEGATIVE RODS ANAEROBIC BOTTLE ONLY CRITICAL VALUE NOTED.  VALUE IS CONSISTENT WITH PREVIOUSLY REPORTED AND CALLED VALUE. Performed at [facility], [place] [address], [place],  [address]  Culture GRAM NEGATIVE RODS  Final   Report Status PENDING  Incomplete  Blood Culture (routine x 2)     Status: Abnormal (Preliminary result)   Collection Time: 03/23/18  2:24 PM  Result Value Ref Range Status   Specimen Description   Final    BLOOD RIGHT ARM Performed at [facility] [address] [place], [place] [address]    Special Requests   Final    BAA BCAV Performed at [facility] [address], [place], [place] [address]    Culture  Setup Time   Final    GRAM NEGATIVE RODS IN BOTH AEROBIC AND ANAEROBIC BOTTLES CRITICAL RESULT CALLED TO, READ BACK BY AND VERIFIED WITH: PHARMD L POINDEXTER [id number] MLM Performed at [facility], [place] [address], [place], [place] [address]    Culture ESCHERICHIA COLI (A)  Final   Report Status PENDING  Incomplete  Blood Culture ID Panel (Reflexed)     Status: Abnormal   Collection Time: 03/23/18  2:24 PM  Result Value Ref Range Status   Enterococcus species NOT DETECTED NOT DETECTED Final   Listeria monocytogenes NOT DETECTED NOT DETECTED Final   Staphylococcus species NOT DETECTED NOT DETECTED Final   Staphylococcus aureus (BCID) NOT DETECTED NOT DETECTED Final   Streptococcus species NOT DETECTED NOT DETECTED Final   Streptococcus agalactiae NOT DETECTED NOT DETECTED Final   Streptococcus pneumoniae NOT DETECTED NOT DETECTED Final    Streptococcus pyogenes NOT DETECTED NOT DETECTED Final   Acinetobacter baumannii NOT DETECTED NOT DETECTED Final   Enterobacteriaceae species DETECTED (A) NOT DETECTED Final    Comment: Enterobacteriaceae represent a large family of gram-negative bacteria, not a single organism. CRITICAL RESULT CALLED TO, READ BACK BY AND VERIFIED WITH: PHARMD L POINDEXTER [id number] MLM    Enterobacter cloacae complex NOT DETECTED NOT DETECTED Final   Escherichia coli DETECTED (A) NOT DETECTED Final    Comment: CRITICAL RESULT CALLED TO, READ BACK BY AND VERIFIED WITH: PHARMD L POINDEXTER [phone] MLM    Klebsiella oxytoca NOT DETECTED NOT DETECTED Final   Klebsiella pneumoniae NOT DETECTED NOT DETECTED Final   Proteus species NOT DETECTED NOT DETECTED Final   Serratia marcescens NOT DETECTED NOT DETECTED Final   Carbapenem resistance NOT DETECTED NOT DETECTED Final   Haemophilus influenzae NOT DETECTED NOT DETECTED Final   Neisseria meningitidis NOT DETECTED NOT DETECTED Final   Pseudomonas aeruginosa NOT DETECTED NOT DETECTED Final   Candida albicans NOT DETECTED NOT DETECTED Final   Candida glabrata NOT DETECTED NOT DETECTED Final   Candida krusei NOT DETECTED NOT DETECTED Final   Candida parapsilosis NOT DETECTED NOT DETECTED Final   Candida tropicalis NOT DETECTED NOT DETECTED Final    Comment: Performed at [facility], [place]. [address] [place], [place] [address]  MRSA PCR Screening     Status: Abnormal   Collection Time: 03/24/18  2:59 AM  Result Value Ref Range Status   MRSA by PCR POSITIVE (A) NEGATIVE Final    Comment:        The GeneXpert MRSA Assay (FDA approved for NASAL specimens only), is one component of a comprehensive MRSA colonization surveillance program. It is not intended to diagnose MRSA infection nor to guide or monitor treatment for MRSA infections. RESULT CALLED TO, READ BACK BY AND VERIFIED WITH: E CAUDLE,RN 03/24/18 0445 RHOLMES Performed at [facility], [place] [address], [place], [place] [address]      Medications:   .  Chlorhexidine Gluconate Cloth  6 each Topical Q0600  . enoxaparin (LOVENOX) injection  60 mg Subcutaneous Q24H  . folic acid  1 mg Oral Daily  . gabapentin  300 mg Oral BID  . multivitamin with minerals  1 tablet Oral Daily  . mupirocin ointment  1 application Nasal BID  . thiamine  100 mg Oral Daily   Or  . thiamine  100 mg Intravenous Daily   Continuous Infusions: . sodium chloride 100 mL/hr at 03/24/18 2357  . cefTRIAXone (ROCEPHIN)  IV 2 g (03/24/18 1400)      LOS: 2 days   Charlynne Cousins  [facility] Pager [phone]  *Please refer to [email or web address], password [email or web address] to get updated schedule on who will round on this patient, as hospitalists switch teams weekly. If 7PM-7AM, please contact night-coverage at [email or web address], password [email or web address] for any overnight needs.  03/25/2018, 9:04 AM

## 2018-03-25 NOTE — Progress Notes (Signed)
CSW following for placement needs. CSW briefly discussed the patient with physician. Patient is agreeable to inpatient psych admissions at this time. No IVC currently needed.  CSW will submit referrals for placement when the patient is medically ready.   Kathrin Greathouse, Marlinda Mike, MSW Clinical Social Worker  [phone] 03/25/2018  10:09 AM

## 2018-03-26 ENCOUNTER — Encounter (HOSPITAL_COMMUNITY): Payer: Self-pay

## 2018-03-26 ENCOUNTER — Other Ambulatory Visit: Payer: Self-pay

## 2018-03-26 ENCOUNTER — Inpatient Hospital Stay (HOSPITAL_COMMUNITY)
Admission: AD | Admit: 2018-03-26 | Discharge: 2018-04-04 | DRG: 885 | Disposition: A | Discharge status: Home / Self Care | Payer: Federal, State, Local not specified - Other | Source: Intra-hospital | Attending: Psychiatry | Admitting: Psychiatry

## 2018-03-26 DIAGNOSIS — G43909 Migraine, unspecified, not intractable, without status migrainosus: Secondary | ICD-10-CM | POA: Diagnosis present

## 2018-03-26 DIAGNOSIS — F315 Bipolar disorder, current episode depressed, severe, with psychotic features: Secondary | ICD-10-CM | POA: Diagnosis not present

## 2018-03-26 DIAGNOSIS — R451 Restlessness and agitation: Secondary | ICD-10-CM | POA: Diagnosis present

## 2018-03-26 DIAGNOSIS — F1020 Alcohol dependence, uncomplicated: Secondary | ICD-10-CM | POA: Diagnosis present

## 2018-03-26 DIAGNOSIS — F1120 Opioid dependence, uncomplicated: Secondary | ICD-10-CM | POA: Diagnosis present

## 2018-03-26 DIAGNOSIS — F1920 Other psychoactive substance dependence, uncomplicated: Secondary | ICD-10-CM | POA: Diagnosis not present

## 2018-03-26 DIAGNOSIS — F4310 Post-traumatic stress disorder, unspecified: Secondary | ICD-10-CM | POA: Diagnosis present

## 2018-03-26 DIAGNOSIS — F1420 Cocaine dependence, uncomplicated: Secondary | ICD-10-CM | POA: Diagnosis present

## 2018-03-26 DIAGNOSIS — Z62810 Personal history of physical and sexual abuse in childhood: Secondary | ICD-10-CM | POA: Diagnosis present

## 2018-03-26 DIAGNOSIS — F17210 Nicotine dependence, cigarettes, uncomplicated: Secondary | ICD-10-CM | POA: Diagnosis present

## 2018-03-26 DIAGNOSIS — Z62811 Personal history of psychological abuse in childhood: Secondary | ICD-10-CM | POA: Diagnosis present

## 2018-03-26 DIAGNOSIS — G4700 Insomnia, unspecified: Secondary | ICD-10-CM | POA: Diagnosis present

## 2018-03-26 DIAGNOSIS — Z818 Family history of other mental and behavioral disorders: Secondary | ICD-10-CM | POA: Diagnosis not present

## 2018-03-26 DIAGNOSIS — F515 Nightmare disorder: Secondary | ICD-10-CM | POA: Diagnosis present

## 2018-03-26 DIAGNOSIS — A4150 Gram-negative sepsis, unspecified: Secondary | ICD-10-CM

## 2018-03-26 DIAGNOSIS — Z590 Homelessness: Secondary | ICD-10-CM

## 2018-03-26 DIAGNOSIS — Z23 Encounter for immunization: Secondary | ICD-10-CM

## 2018-03-26 DIAGNOSIS — F3181 Bipolar II disorder: Principal | ICD-10-CM | POA: Diagnosis present

## 2018-03-26 DIAGNOSIS — R45851 Suicidal ideations: Secondary | ICD-10-CM | POA: Diagnosis present

## 2018-03-26 DIAGNOSIS — R7881 Bacteremia: Secondary | ICD-10-CM

## 2018-03-26 LAB — CBC
HEMATOCRIT: 40 % (ref 36.0–46.0)
HEMOGLOBIN: 12.5 g/dL (ref 12.0–15.0)
MCH: 29.7 pg (ref 26.0–34.0)
MCHC: 31.3 g/dL (ref 30.0–36.0)
MCV: 95 fL (ref 80.0–100.0)
Platelets: 596 10*3/uL — ABNORMAL HIGH (ref 150–400)
RBC: 4.21 MIL/uL (ref 3.87–5.11)
RDW: 14.5 % (ref 11.5–15.5)
WBC: 12.1 10*3/uL — ABNORMAL HIGH (ref 4.0–10.5)
nRBC: 0 % (ref 0.0–0.2)

## 2018-03-26 LAB — CULTURE, BLOOD (ROUTINE X 2)

## 2018-03-26 LAB — CREATININE, SERUM
CREATININE: 0.72 mg/dL (ref 0.44–1.00)
GFR calc Af Amer: >60 mL/min (ref >60)

## 2018-03-26 MED ORDER — HYDROCORTISONE 2.5 % RE CREA: TOPICAL_CREAM | Freq: Three times a day (TID) | RECTAL | 0 refills | Status: DC

## 2018-03-26 MED ORDER — ADULT MULTIVITAMIN W/MINERALS CH
1.0000 | ORAL_TABLET | Freq: Every day | ORAL | Status: DC
  Administered 2018-03-27 – 2018-04-03 (×8): 1 via ORAL
  Filled 2018-03-26 (×11): qty 1

## 2018-03-26 MED ORDER — ENOXAPARIN SODIUM 60 MG/0.6ML ~~LOC~~ SOLN
60.0000 mg | SUBCUTANEOUS | Status: DC
  Filled 2018-03-26: qty 0.6

## 2018-03-26 MED ORDER — INFLUENZA VAC SPLIT QUAD 0.5 ML IM SUSY
0.5000 mL | PREFILLED_SYRINGE | INTRAMUSCULAR | Status: AC
  Administered 2018-03-27: 0.5 mL via INTRAMUSCULAR
  Filled 2018-03-26: qty 0.5

## 2018-03-26 MED ORDER — AMOXICILLIN 875 MG PO TABS: 875.0000 mg | ORAL_TABLET | Freq: Two times a day (BID) | ORAL | 0 refills | Status: DC

## 2018-03-26 MED ORDER — GABAPENTIN 300 MG PO CAPS
300.0000 mg | ORAL_CAPSULE | Freq: Two times a day (BID) | ORAL | Status: DC
  Administered 2018-03-26 – 2018-03-29 (×6): 300 mg via ORAL
  Filled 2018-03-26 (×11): qty 1

## 2018-03-26 MED ORDER — MUPIROCIN 2 % EX OINT
1.0000 "application " | TOPICAL_OINTMENT | Freq: Two times a day (BID) | CUTANEOUS | Status: AC
  Administered 2018-03-26 – 2018-03-27 (×3): 1 via NASAL
  Filled 2018-03-26 (×2): qty 22

## 2018-03-26 MED ORDER — PNEUMOCOCCAL VAC POLYVALENT 25 MCG/0.5ML IJ INJ
0.5000 mL | INJECTION | INTRAMUSCULAR | Status: AC
  Administered 2018-03-27: 0.5 mL via INTRAMUSCULAR

## 2018-03-26 MED ORDER — LORAZEPAM 1 MG PO TABS
1.0000 mg | ORAL_TABLET | Freq: Two times a day (BID) | ORAL | Status: DC
  Administered 2018-03-27: 1 mg via ORAL
  Filled 2018-03-26: qty 1

## 2018-03-26 MED ORDER — ACETAMINOPHEN 325 MG PO TABS
650.0000 mg | ORAL_TABLET | Freq: Four times a day (QID) | ORAL | Status: DC | PRN
  Administered 2018-03-28 – 2018-04-02 (×2): 650 mg via ORAL
  Filled 2018-03-26 (×2): qty 2

## 2018-03-26 MED ORDER — POLYETHYLENE GLYCOL 3350 17 G PO PACK
17.0000 g | PACK | Freq: Every day | ORAL | Status: DC
  Administered 2018-03-27 – 2018-04-03 (×4): 17 g via ORAL
  Filled 2018-03-26 (×10): qty 1

## 2018-03-26 MED ORDER — LORAZEPAM 1 MG PO TABS
1.0000 mg | ORAL_TABLET | Freq: Every day | ORAL | Status: AC
  Administered 2018-03-26: 1 mg via ORAL
  Filled 2018-03-26: qty 1

## 2018-03-26 MED ORDER — HYDRALAZINE HCL 20 MG/ML IJ SOLN: 5.0000 mg | Freq: Four times a day (QID) | INTRAMUSCULAR | Status: DC | PRN

## 2018-03-26 MED ORDER — GABAPENTIN 300 MG PO CAPS: 300.0000 mg | ORAL_CAPSULE | Freq: Two times a day (BID) | ORAL | Status: DC

## 2018-03-26 MED ORDER — FOLIC ACID 1 MG PO TABS
1.0000 mg | ORAL_TABLET | Freq: Every day | ORAL | Status: DC
  Administered 2018-03-27 – 2018-04-03 (×8): 1 mg via ORAL
  Filled 2018-03-26 (×11): qty 1

## 2018-03-26 MED ORDER — HYDROCORTISONE 2.5 % RE CREA
TOPICAL_CREAM | Freq: Three times a day (TID) | RECTAL | Status: DC
  Administered 2018-03-27: 1 via RECTAL
  Administered 2018-03-29 – 2018-03-30 (×4): via RECTAL
  Filled 2018-03-26: qty 28.35

## 2018-03-26 MED ORDER — VITAMIN B-1 100 MG PO TABS
100.0000 mg | ORAL_TABLET | Freq: Every day | ORAL | Status: DC
  Administered 2018-03-27 – 2018-04-03 (×8): 100 mg via ORAL
  Filled 2018-03-26 (×11): qty 1

## 2018-03-26 MED ORDER — HYDROCORTISONE 2.5 % RE CREA
TOPICAL_CREAM | Freq: Three times a day (TID) | RECTAL | Status: DC
  Administered 2018-03-26 (×2): via RECTAL
  Filled 2018-03-26: qty 28.35

## 2018-03-26 MED ORDER — THIAMINE HCL 100 MG/ML IJ SOLN: 100.0000 mg | Freq: Every day | INTRAMUSCULAR | Status: DC

## 2018-03-26 MED ORDER — HYDROXYZINE HCL 25 MG PO TABS
25.0000 mg | ORAL_TABLET | Freq: Four times a day (QID) | ORAL | Status: DC | PRN
  Administered 2018-03-26: 25 mg via ORAL
  Filled 2018-03-26: qty 1

## 2018-03-26 MED ORDER — SULFAMETHOXAZOLE-TRIMETHOPRIM 800-160 MG PO TABS
1.0000 | ORAL_TABLET | Freq: Two times a day (BID) | ORAL | Status: DC
  Administered 2018-03-26 – 2018-04-03 (×17): 1 via ORAL
  Filled 2018-03-26 (×24): qty 1

## 2018-03-26 MED ORDER — TRAZODONE HCL 50 MG PO TABS
50.0000 mg | ORAL_TABLET | Freq: Every evening | ORAL | Status: DC | PRN
  Administered 2018-03-26 – 2018-03-27 (×2): 50 mg via ORAL
  Filled 2018-03-26 (×2): qty 1

## 2018-03-26 NOTE — Progress Notes (Signed)
Consulted by RN.  Provided pt clothes from clothing closet prior to discharge to [facility].    Introduced spiritual care as resource.  Will follow up with patient for continued support at [facility].    Please page as needs arise.

## 2018-03-26 NOTE — Progress Notes (Signed)
Pt is a 37 year old female admitted with depression and suicidal ideation    She is also polysubstance aqbuse using cocain heroin and ETOH daily   Her plan was to overdose on drugs   Pt was admitted to  with sepsis which they think came from shooting up heroine    She had a break up with her girlfriend and is homeless at present    She reports a history of mental illness and past hospitalizations for same    She has a severe legal history having spent 12 years in prison with various felony charges    She said she took the blame for crimes she did not commit   She is cooperative during the assessment   Nourishment given   Oriented to the unit   Medications given per MD orders and will continue to evaluate effectiveness   Q 15 min checks and verbal support and encouragement given    Pt adjusting well and requested to stay back from breakfast

## 2018-03-26 NOTE — Progress Notes (Signed)
Report given to behavior health at [facility]

## 2018-03-26 NOTE — Progress Notes (Signed)
Patient accepted to [facility]. Patient can arrive at 8:30 pm. Room 304.   Nurse Please call report to [phone] before the patient discharges from the floor.   CSW arranged [facility] transport for 8:00pm pick up.   CSW informed the nurse.   Kathrin Greathouse, Marlinda Mike, MSW Clinical Social Worker  [phone] 03/26/2018  2:34 PM

## 2018-03-26 NOTE — Tx Team (Signed)
Initial Treatment Plan 03/26/2018 10:42 PM [name] TXH:[id number]    PATIENT STRESSORS: Marital or family conflict Medication change or noncompliance Substance abuse   PATIENT STRENGTHS: Average or above average intelligence General fund of knowledge Supportive family/friends   PATIENT IDENTIFIED PROBLEMS: "help finding a halfway house "  " get off ETOH and heroine"                   DISCHARGE CRITERIA:  Improved stabilization in mood, thinking, and/or behavior Motivation to continue treatment in a less acute level of care Verbal commitment to aftercare and medication compliance Withdrawal symptoms are absent or subacute and managed without 24-hour nursing intervention  PRELIMINARY DISCHARGE PLAN: Attend aftercare/continuing care group Attend 12-step recovery group Placement in alternative living arrangements  PATIENT/FAMILY INVOLVEMENT: This treatment plan has been presented to and reviewed with the patient, Sarah Cervantes, and/or family member, .  The patient and family have been given the opportunity to ask questions and make suggestions.  Migdalia Dk, RN 03/26/2018, 10:42 PM

## 2018-03-26 NOTE — Discharge Summary (Addendum)
Physician Discharge Summary  Sarah Cervantes TKW:[id number] DOB: 1980/12/29 DOA: 03/23/2018  PCP: Patient, No Pcp Per  Admit date: 03/23/2018 Discharge date: 03/26/2018  Admitted From: home Disposition:  [facility]  Recommendations for Outpatient Follow-up:  1. Will go to [facility] for inpatient psyq eval  Home Health:No Equipment/Devices:none  Discharge Condition:Stable CODE STATUS:full Diet recommendation: Heart Healthy  Brief/Interim Summary: 37 y.o. female past medical history of cocaine and heroin comes in complaining of a temperature of 103.0 with nausea vomiting and diarrhea, she does complain of some hematochezia.  Discharge Diagnoses:  Principal Problem:   Bipolar II disorder ([place]) Active Problems:   Sepsis, Gram negative ([place])   Alcohol abuse with intoxication ([place])   Polysubstance (excluding opioids) dependence (H[facility])   Cocaine abuse with cocaine-induced mood disorder (HCC)   Chronic post-traumatic stress disorder (PTSD)   Bacteremia due to Gram-negative bacteria Sepsis due to E. coli bacteremia. She was started empirically on IV Rocephin, blood cultures were + E. Coli,  pansensitive CT maxillofacial shows no cavities.  She has no source of infection in her skin.  Just a old healed scar in her right big toe. Urine cultures not checked on admission. 2D echo results: 55% no vegetation. She can go to behavioral health on amoxicillin for an additional 10 days an outpatient.  Suicidal thoughts: Consulted psychiatry they recommended inpatient psych admissions.  Alcohol abuse with intoxication (HCC)/Polysubstance (excluding opioids) dependence (HCC) No signs of withdrawal continue gabapentin.  Elevated blood pressure without a diagnosis of hypertension: Blood pressure is significantly improved today continue to monitor   Discharge Instructions  Discharge Instructions    Diet - low sodium heart healthy   Complete by:  As directed    Increase activity slowly   Complete  by:  As directed      Allergies as of 03/26/2018      Reactions   Latex Swelling, Rash   Hands swell      Medication List    TAKE these medications   amoxicillin 875 MG tablet Commonly known as:  AMOXIL Take 1 tablet (875 mg total) by mouth 2 (two) times daily.   gabapentin 300 MG capsule Commonly known as:  NEURONTIN Take 1 capsule (300 mg total) by mouth 2 (two) times daily.   hydrocortisone 2.5 % rectal cream Commonly known as:  ANUSOL-HC Place rectally 3 (three) times daily.       Allergies  Allergen Reactions  . Latex Swelling and Rash    Hands swell    Consultations: Psychiatry.  Procedures/Studies: Ct Maxillofacial W Contrast  Result Date: 03/25/2018 CLINICAL DATA:  Painful chewing. History of dental caries and substance abuse. EXAM: CT MAXILLOFACIAL WITH CONTRAST TECHNIQUE: Multidetector CT imaging of the maxillofacial structures was performed with intravenous contrast. Multiplanar CT image reconstructions were also generated. CONTRAST:  22mL OMNIPAQUE IOHEXOL 300 MG/ML  SOLN COMPARISON:  None. FINDINGS: OSSEOUS: No acute facial fracture. The mandible is intact, the condyles are located. No destructive bony lesions. No CT evidence of dental pathology (no identified caries or periapical abscess. ORBITS: Ocular globes and orbital contents are normal. SINUSES: Paranasal sinuses are well aerated. RIGHT concha lamella. Intact nasal septum is mildly deviated to the LEFT. Mastoid aircells are well aerated. SOFT TISSUES: No significant soft tissue swelling. No subcutaneous gas or radiopaque foreign bodies. LIMITED INTRACRANIAL: Normal. IMPRESSION: 1. Negative contrast enhanced CT face; no acute dental pathology. Electronically Signed   By: Elon Alas M.D.   On: 03/25/2018 13:35   Dg Chest [facility]  Result Date: 03/23/2018 CLINICAL DATA:  Pt. admits to snorting cocaine and using heroin last night. No known heart or lung conditions. EXAM: PORTABLE CHEST 1 VIEW  COMPARISON:  None. FINDINGS: The heart size and mediastinal contours are within normal limits. Both lungs are clear. The visualized skeletal structures are unremarkable. IMPRESSION: No active disease. Electronically Signed   By: Nolon Nations M.D.   On: 03/23/2018 14:31    (Echo, Carotid, EGD, Colonoscopy, ERCP)    Subjective: No complaints feels great.  Discharge Exam: Vitals:   03/25/18 2242 03/26/18 0632  BP: (!) 145/90 125/86  Pulse: 66 64  Resp: 17 17  Temp: 98 F (36.7 C) 98.2 F (36.8 C)  SpO2: 97% 97%   Vitals:   03/25/18 0546 03/25/18 1459 03/25/18 2242 03/26/18 0632  BP: 136/86 (!) 150/98 (!) 145/90 125/86  Pulse: 67 70 66 64  Resp: 18 18 17 17   Temp: 98.3 F (36.8 C) 98.6 F (37 C) 98 F (36.7 C) 98.2 F (36.8 C)  TempSrc: Oral Oral Oral Oral  SpO2: 97% 97% 97% 97%  Weight:      Height:        General: Pt is alert, awake, not in acute distress Cardiovascular: RRR, S1/S2 +, no rubs, no gallops Respiratory: CTA bilaterally, no wheezing, no rhonchi Abdominal: Soft, NT, ND, bowel sounds + Extremities: no edema, no cyanosis    The results of significant diagnostics from this hospitalization (including imaging, microbiology, ancillary and laboratory) are listed below for reference.     Microbiology: Recent Results (from the past 240 hour(s))  Culture, blood (single)     Status: None (Preliminary result)   Collection Time: 03/23/18  2:01 PM  Result Value Ref Range Status   Specimen Description   Final    BLOOD RIGHT HAND Performed at [facility], [place] [address], [place], [place] [address]    Special Requests   Final    BAA BCAV Performed at [facility] [address] [place], [place] [address]    Culture   Final    NO GROWTH 2 DAYS Performed at [facility] [address] [place], [place] [address]    Report Status PENDING  Incomplete  Blood Culture (routine x 2)     Status: Abnormal    Collection Time: 03/23/18  2:24 PM  Result Value Ref Range Status   Specimen Description   Final    BLOOD LEFT HAND Performed at [facility] [address], [place], [place] [address]    Special Requests   Final    BAA BCAV Performed at [facility] [address], [place], [place] [address]    Culture  Setup Time   Final    GRAM NEGATIVE RODS ANAEROBIC BOTTLE ONLY CRITICAL VALUE NOTED.  VALUE IS CONSISTENT WITH PREVIOUSLY REPORTED AND CALLED VALUE.    Culture (A)  Final    ESCHERICHIA COLI SUSCEPTIBILITIES PERFORMED ON PREVIOUS CULTURE WITHIN THE LAST 5 DAYS. Performed at [facility], [place] [address], [place], [place] [address]    Report Status 03/26/2018 FINAL  Final  Blood Culture (routine x 2)     Status: Abnormal   Collection Time: 03/23/18  2:24 PM  Result Value Ref Range Status   Specimen Description   Final    BLOOD RIGHT ARM Performed at [facility] [address] [place], [place] [address]    Special Requests   Final  BAA BCAV Performed at [facility] [address], [place], [place] [address]    Culture  Setup Time   Final    GRAM NEGATIVE RODS IN BOTH AEROBIC AND ANAEROBIC BOTTLES CRITICAL RESULT CALLED TO, READ BACK BY AND VERIFIED WITH: PHARMD L POINDEXTER [phone] MLM Performed at [facility], [place] [address], [place], [place] [address]    Culture ESCHERICHIA COLI (A)  Final   Report Status 03/26/2018 FINAL  Final   Organism ID, Bacteria ESCHERICHIA COLI  Final      Susceptibility   Escherichia coli - MIC*    AMPICILLIN <=2 SENSITIVE Sensitive     CEFAZOLIN <=4 SENSITIVE Sensitive     CEFEPIME <=1 SENSITIVE Sensitive     CEFTAZIDIME <=1 SENSITIVE Sensitive     CEFTRIAXONE <=1 SENSITIVE Sensitive     CIPROFLOXACIN <=0.25 SENSITIVE Sensitive     GENTAMICIN <=1 SENSITIVE Sensitive     IMIPENEM <=0.25 SENSITIVE Sensitive     TRIMETH/SULFA <=20 SENSITIVE  Sensitive     AMPICILLIN/SULBACTAM <=2 SENSITIVE Sensitive     PIP/TAZO <=4 SENSITIVE Sensitive     Extended ESBL NEGATIVE Sensitive     * ESCHERICHIA COLI  Blood Culture ID Panel (Reflexed)     Status: Abnormal   Collection Time: 03/23/18  2:24 PM  Result Value Ref Range Status   Enterococcus species NOT DETECTED NOT DETECTED Final   Listeria monocytogenes NOT DETECTED NOT DETECTED Final   Staphylococcus species NOT DETECTED NOT DETECTED Final   Staphylococcus aureus (BCID) NOT DETECTED NOT DETECTED Final   Streptococcus species NOT DETECTED NOT DETECTED Final   Streptococcus agalactiae NOT DETECTED NOT DETECTED Final   Streptococcus pneumoniae NOT DETECTED NOT DETECTED Final   Streptococcus pyogenes NOT DETECTED NOT DETECTED Final   Acinetobacter baumannii NOT DETECTED NOT DETECTED Final   Enterobacteriaceae species DETECTED (A) NOT DETECTED Final    Comment: Enterobacteriaceae represent a large family of gram-negative bacteria, not a single organism. CRITICAL RESULT CALLED TO, READ BACK BY AND VERIFIED WITH: PHARMD L POINDEXTER [phone] MLM    Enterobacter cloacae complex NOT DETECTED NOT DETECTED Final   Escherichia coli DETECTED (A) NOT DETECTED Final    Comment: CRITICAL RESULT CALLED TO, READ BACK BY AND VERIFIED WITH: PHARMD L POINDEXTER [phone] MLM    Klebsiella oxytoca NOT DETECTED NOT DETECTED Final   Klebsiella pneumoniae NOT DETECTED NOT DETECTED Final   Proteus species NOT DETECTED NOT DETECTED Final   Serratia marcescens NOT DETECTED NOT DETECTED Final   Carbapenem resistance NOT DETECTED NOT DETECTED Final   Haemophilus influenzae NOT DETECTED NOT DETECTED Final   Neisseria meningitidis NOT DETECTED NOT DETECTED Final   Pseudomonas aeruginosa NOT DETECTED NOT DETECTED Final   Candida albicans NOT DETECTED NOT DETECTED Final   Candida glabrata NOT DETECTED NOT DETECTED Final   Candida krusei NOT DETECTED NOT DETECTED Final   Candida parapsilosis NOT  DETECTED NOT DETECTED Final   Candida tropicalis NOT DETECTED NOT DETECTED Final    Comment: Performed at [facility], [place]. [address], [place], [place] [address]  MRSA PCR Screening     Status: Abnormal   Collection Time: 03/24/18  2:59 AM  Result Value Ref Range Status   MRSA by PCR POSITIVE (A) NEGATIVE Final    Comment:        The GeneXpert MRSA Assay (FDA approved for NASAL specimens only), is one component of a comprehensive MRSA colonization surveillance program. It is not intended  to diagnose MRSA infection nor to guide or monitor treatment for MRSA infections. RESULT CALLED TO, READ BACK BY AND VERIFIED WITH: E CAUDLE,RN 03/24/18 0445 RHOLMES Performed at [facility], [place] [address], [place], [place] [address]      Labs: BNP (last 3 results) No results for input(s): BNP in the last 8760 hours. Basic Metabolic Panel: Recent Labs  Lab 03/23/18 1411 03/23/18 1843 03/24/18 0722 03/25/18 0753 03/26/18 0630  NA 139 139  --   --   --   K 3.7 3.8  --   --   --   CL 108 108  --   --   --   CO2 23 24  --   --   --   GLUCOSE 106* 96  --   --   --   BUN 8 9  --   --   --   CREATININE 0.82 0.85 0.84 0.78 0.72  CALCIUM 8.1* 8.0*  --   --   --    Liver Function Tests: Recent Labs  Lab 03/23/18 1411  AST 14*  ALT 16  ALKPHOS 59  BILITOT 0.9  PROT 7.1  ALBUMIN 2.9*   No results for input(s): LIPASE, AMYLASE in the last 168 hours. No results for input(s): AMMONIA in the last 168 hours. CBC: Recent Labs  Lab 03/23/18 1411 03/24/18 0722 03/25/18 0753 03/26/18 0630  WBC 18.3* 15.4* 13.6* 12.1*  NEUTROABS 15.1*  --   --   --   HGB 12.7 11.9* 12.3 12.5  HCT 40.1 38.7 39.2 40.0  MCV 94.1 96.0 94.7 95.0  PLT 559* 546* 602* 596*   Cardiac Enzymes: No results for input(s): CKTOTAL, CKMB, CKMBINDEX, TROPONINI in the last 168 hours. BNP: Invalid input(s): POCBNP CBG: No results for input(s): GLUCAP in the last 168 hours. D-Dimer No  results for input(s): DDIMER in the last 72 hours. Hgb A1c Recent Labs    03/24/18 0810  HGBA1C 5.6   Lipid Profile No results for input(s): CHOL, HDL, LDLCALC, TRIG, CHOLHDL, LDLDIRECT in the last 72 hours. Thyroid function studies No results for input(s): TSH, T4TOTAL, T3FREE, THYROIDAB in the last 72 hours.  Invalid input(s): FREET3 Anemia work up No results for input(s): VITAMINB12, FOLATE, FERRITIN, TIBC, IRON, RETICCTPCT in the last 72 hours. Urinalysis    Component Value Date/Time   COLORURINE YELLOW 03/23/2018 [place] 03/23/2018 1428   LABSPEC 1.005 03/23/2018 1428   PHURINE 7.0 03/23/2018 1428   GLUCOSEU NEGATIVE 03/23/2018 1428   HGBUR SMALL (A) 03/23/2018 1428   BILIRUBINUR NEGATIVE 03/23/2018 1428   KETONESUR NEGATIVE 03/23/2018 1428   PROTEINUR NEGATIVE 03/23/2018 1428   NITRITE NEGATIVE 03/23/2018 1428   LEUKOCYTESUR SMALL (A) 03/23/2018 1428   Sepsis Labs Invalid input(s): PROCALCITONIN,  WBC,  LACTICIDVEN Microbiology Recent Results (from the past 240 hour(s))  Culture, blood (single)     Status: None (Preliminary result)   Collection Time: 03/23/18  2:01 PM  Result Value Ref Range Status   Specimen Description   Final    BLOOD RIGHT HAND Performed at [facility], [place] [address], [place], [place] [address]    Special Requests   Final    BAA BCAV Performed at [facility] [address], [place], [place] [address]    Culture   Final    NO GROWTH 2 DAYS Performed at [facility] [address], [place], [place] [address]    Report Status PENDING  Incomplete  Blood Culture (routine x 2)     Status: Abnormal   Collection Time: 03/23/18  2:24 PM  Result Value Ref Range Status   Specimen Description   Final    BLOOD LEFT HAND Performed at [facility] [address] [place], [place] [address]    Special Requests   Final    BAA BCAV Performed at [facility] [address] [place], [place] [address]    Culture  Setup Time   Final    GRAM NEGATIVE RODS ANAEROBIC BOTTLE ONLY CRITICAL VALUE NOTED.  VALUE IS CONSISTENT WITH PREVIOUSLY REPORTED AND CALLED VALUE.    Culture (A)  Final    ESCHERICHIA COLI SUSCEPTIBILITIES PERFORMED ON PREVIOUS CULTURE WITHIN THE LAST 5 DAYS. Performed at [facility], [place] [address] [place], [place] [address]    Report Status 03/26/2018 FINAL  Final  Blood Culture (routine x 2)     Status: Abnormal   Collection Time: 03/23/18  2:24 PM  Result Value Ref Range Status   Specimen Description   Final    BLOOD RIGHT ARM Performed at [facility] [address], [place], [place] [address]    Special Requests   Final    BAA BCAV Performed at [facility] [address] [place], [place] [address]    Culture  Setup Time   Final    GRAM NEGATIVE RODS IN BOTH AEROBIC AND ANAEROBIC BOTTLES CRITICAL RESULT CALLED TO, READ BACK BY AND VERIFIED WITH: PHARMD L POINDEXTER [phone] MLM Performed at [facility], [place] [address], [place], [place] [address]    Culture ESCHERICHIA COLI (A)  Final   Report Status 03/26/2018 FINAL  Final   Organism ID, Bacteria ESCHERICHIA COLI  Final      Susceptibility   Escherichia coli - MIC*    AMPICILLIN <=2 SENSITIVE Sensitive     CEFAZOLIN <=4 SENSITIVE Sensitive     CEFEPIME <=1 SENSITIVE Sensitive     CEFTAZIDIME <=1 SENSITIVE Sensitive     CEFTRIAXONE <=1 SENSITIVE Sensitive     CIPROFLOXACIN <=0.25 SENSITIVE Sensitive     GENTAMICIN <=1 SENSITIVE Sensitive     IMIPENEM <=0.25 SENSITIVE Sensitive     TRIMETH/SULFA <=20 SENSITIVE Sensitive     AMPICILLIN/SULBACTAM <=2 SENSITIVE Sensitive     PIP/TAZO <=4 SENSITIVE Sensitive     Extended ESBL NEGATIVE Sensitive     * ESCHERICHIA COLI  Blood Culture ID Panel (Reflexed)     Status: Abnormal   Collection Time: 03/23/18  2:24 PM  Result Value Ref Range  Status   Enterococcus species NOT DETECTED NOT DETECTED Final   Listeria monocytogenes NOT DETECTED NOT DETECTED Final   Staphylococcus species NOT DETECTED NOT DETECTED Final   Staphylococcus aureus (BCID) NOT DETECTED NOT DETECTED Final   Streptococcus species NOT DETECTED NOT DETECTED Final   Streptococcus agalactiae NOT DETECTED NOT DETECTED Final   Streptococcus pneumoniae NOT DETECTED NOT DETECTED Final   Streptococcus pyogenes NOT DETECTED NOT DETECTED Final   Acinetobacter baumannii NOT DETECTED NOT DETECTED Final   Enterobacteriaceae species DETECTED (A) NOT DETECTED Final    Comment: Enterobacteriaceae represent a large family of gram-negative bacteria, not a single organism. CRITICAL RESULT CALLED TO, READ BACK BY AND VERIFIED WITH: PHARMD L POINDEXTER [phone] MLM    Enterobacter cloacae complex NOT DETECTED NOT DETECTED Final   Escherichia coli DETECTED (A) NOT DETECTED Final    Comment: CRITICAL  RESULT CALLED TO, READ BACK BY AND VERIFIED WITH: PHARMD L POINDEXTER [phone] MLM    Klebsiella oxytoca NOT DETECTED NOT DETECTED Final   Klebsiella pneumoniae NOT DETECTED NOT DETECTED Final   Proteus species NOT DETECTED NOT DETECTED Final   Serratia marcescens NOT DETECTED NOT DETECTED Final   Carbapenem resistance NOT DETECTED NOT DETECTED Final   Haemophilus influenzae NOT DETECTED NOT DETECTED Final   Neisseria meningitidis NOT DETECTED NOT DETECTED Final   Pseudomonas aeruginosa NOT DETECTED NOT DETECTED Final   Candida albicans NOT DETECTED NOT DETECTED Final   Candida glabrata NOT DETECTED NOT DETECTED Final   Candida krusei NOT DETECTED NOT DETECTED Final   Candida parapsilosis NOT DETECTED NOT DETECTED Final   Candida tropicalis NOT DETECTED NOT DETECTED Final    Comment: Performed at [facility], [place] [address] [place], [place] [address]  MRSA PCR Screening     Status: Abnormal   Collection Time: 03/24/18  2:59 AM  Result Value Ref Range Status    MRSA by PCR POSITIVE (A) NEGATIVE Final    Comment:        The GeneXpert MRSA Assay (FDA approved for NASAL specimens only), is one component of a comprehensive MRSA colonization surveillance program. It is not intended to diagnose MRSA infection nor to guide or monitor treatment for MRSA infections. RESULT CALLED TO, READ BACK BY AND VERIFIED WITH: E CAUDLE,RN 03/24/18 0445 RHOLMES Performed at [facility], [place] [address] [place], [place] [address]      Time coordinating discharge: 40 minutes  SIGNED:   Charlynne Cousins, MD  [facility] 03/26/2018, 11:21 AM Pager   If 7PM-7AM, please contact night-coverage [email or web address] Password [email or web address]

## 2018-03-26 NOTE — Progress Notes (Signed)
[facility] PROGRESS NOTE    Progress Note  Sarah Cervantes  DDU:[id number] DOB: [birth year] DOA: 03/23/2018 PCP: Patient, No Pcp Per     Brief Narrative:   Sarah Cervantes is an 37 y.o. female past medical history of cocaine and heroin comes in complaining of a temperature of 103.0 with nausea vomiting and diarrhea, she does complain of some hematochezia.  Assessment/Plan:   Sepsis due to E. coli bacteremia. Blood cultures were + E. Coli, sensitivities are pending. CT maxillofacial shows no cavities.  She has no source of infection in her skin.  Just a old healed scar in her right big toe. We will continue IV Rocephin. Urine cultures not checked on admission. Use haldol for agitation. 2D echo results: 55% no vegetation. She can go to behavioral health once sensitivities are back on oral antibiotics for 2 weeks.  Suicidal thoughts: Consulted psychiatry they recommended inpatient psych admissions after has been cleared from the medical standpoint.  Alcohol abuse with intoxication (HCC)/Polysubstance (excluding opioids) dependence (HCC) No signs of withdrawal continue gabapentin.  Elevated blood pressure without a diagnosis of hypertension: Blood pressure is significantly improved today continue to monitor   DVT prophylaxis: SCD's Family Communication:None Disposition Plan/Barrier to D/C: to inpatient psyq when cleared medically.  Code Status:     Code Status Orders  (From admission, onward)         Start     Ordered   03/23/18 1640  Full code  Continuous     03/23/18 1639        Code Status History    This patient has a current code status but no historical code status.        IV Access:    Peripheral IV   Procedures and diagnostic studies:   Ct Maxillofacial W Contrast  Result Date: 03/25/2018 CLINICAL DATA:  Painful chewing. History of dental caries and substance abuse. EXAM: CT MAXILLOFACIAL WITH CONTRAST TECHNIQUE: Multidetector CT  imaging of the maxillofacial structures was performed with intravenous contrast. Multiplanar CT image reconstructions were also generated. CONTRAST:  9mL OMNIPAQUE IOHEXOL 300 MG/ML  SOLN COMPARISON:  None. FINDINGS: OSSEOUS: No acute facial fracture. The mandible is intact, the condyles are located. No destructive bony lesions. No CT evidence of dental pathology (no identified caries or periapical abscess. ORBITS: Ocular globes and orbital contents are normal. SINUSES: Paranasal sinuses are well aerated. RIGHT concha lamella. Intact nasal septum is mildly deviated to the LEFT. Mastoid aircells are well aerated. SOFT TISSUES: No significant soft tissue swelling. No subcutaneous gas or radiopaque foreign bodies. LIMITED INTRACRANIAL: Normal. IMPRESSION: 1. Negative contrast enhanced CT face; no acute dental pathology. Electronically Signed   By: Elon Alas M.D.   On: 03/25/2018 13:35     Medical Consultants:    None.  Anti-Infectives:   Cafepime  Subjective:    Sarah Cervantes she feels great, in a much better mood than yesterday. She relates she is never had urinary incontinence foul-smelling urine or suprapubic discomfort or dysuria.  Objective:    Vitals:   03/25/18 0546 03/25/18 1459 03/25/18 2242 03/26/18 0632  BP: 136/86 (!) 150/98 (!) 145/90 125/86  Pulse: 67 70 66 64  Resp: 18 18 17 17   Temp: 98.3 F (36.8 C) 98.6 F (37 C) 98 F (36.7 C) 98.2 F (36.8 C)  TempSrc: Oral Oral Oral Oral  SpO2: 97% 97% 97% 97%  Weight:      Height:        Intake/Output Summary (Last 24 hours) at  03/26/2018 0959 Last data filed at 03/26/2018 0744 Gross per 24 hour  Intake 3898.33 ml  Output -  Net 3898.33 ml   Filed Weights   03/23/18 1315 03/23/18 1741  Weight: 131.5 kg 132.6 kg    Exam: General exam: In no acute distress. Respiratory system: Good air movement and clear to auscultation. Cardiovascular system: S1 & S2 heard, RRR.  Gastrointestinal system: Abdomen is  nondistended, soft and nontender, no suprapubic tenderness. Central nervous system: Alert and oriented. No focal neurological deficits. Extremities: No pedal edema. Skin: No rashes, lesions or ulcers Psychiatry: Judgement and insight appear normal. Mood & affect appropriate.    Data Reviewed:    Labs: Basic Metabolic Panel: Recent Labs  Lab 03/23/18 1411 03/23/18 1843 03/24/18 0722 03/25/18 0753 03/26/18 0630  NA 139 139  --   --   --   K 3.7 3.8  --   --   --   CL 108 108  --   --   --   CO2 23 24  --   --   --   GLUCOSE 106* 96  --   --   --   BUN 8 9  --   --   --   CREATININE 0.82 0.85 0.84 0.78 0.72  CALCIUM 8.1* 8.0*  --   --   --    GFR Estimated Creatinine Clearance: 128 mL/min (by C-G formula based on SCr of 0.72 mg/dL). Liver Function Tests: Recent Labs  Lab 03/23/18 1411  AST 14*  ALT 16  ALKPHOS 59  BILITOT 0.9  PROT 7.1  ALBUMIN 2.9*   No results for input(s): LIPASE, AMYLASE in the last 168 hours. No results for input(s): AMMONIA in the last 168 hours. Coagulation profile No results for input(s): INR, PROTIME in the last 168 hours.  CBC: Recent Labs  Lab 03/23/18 1411 03/24/18 0722 03/25/18 0753 03/26/18 0630  WBC 18.3* 15.4* 13.6* 12.1*  NEUTROABS 15.1*  --   --   --   HGB 12.7 11.9* 12.3 12.5  HCT 40.1 38.7 39.2 40.0  MCV 94.1 96.0 94.7 95.0  PLT 559* 546* 602* 596*   Cardiac Enzymes: No results for input(s): CKTOTAL, CKMB, CKMBINDEX, TROPONINI in the last 168 hours. BNP (last 3 results) No results for input(s): PROBNP in the last 8760 hours. CBG: No results for input(s): GLUCAP in the last 168 hours. D-Dimer: No results for input(s): DDIMER in the last 72 hours. Hgb A1c: Recent Labs    03/24/18 0810  HGBA1C 5.6   Lipid Profile: No results for input(s): CHOL, HDL, LDLCALC, TRIG, CHOLHDL, LDLDIRECT in the last 72 hours. Thyroid function studies: No results for input(s): TSH, T4TOTAL, T3FREE, THYROIDAB in the last 72  hours.  Invalid input(s): FREET3 Anemia work up: No results for input(s): VITAMINB12, FOLATE, FERRITIN, TIBC, IRON, RETICCTPCT in the last 72 hours. Sepsis Labs: Recent Labs  Lab 03/23/18 1411 03/23/18 1435 03/24/18 0722 03/25/18 0753 03/26/18 0630  WBC 18.3*  --  15.4* 13.6* 12.1*  LATICACIDVEN  --  0.97  --   --   --    Microbiology Recent Results (from the past 240 hour(s))  Culture, blood (single)     Status: None (Preliminary result)   Collection Time: 03/23/18  2:01 PM  Result Value Ref Range Status   Specimen Description   Final    BLOOD RIGHT HAND Performed at [facility] [place], [place] [address], [place], [place] [address]    Special Requests  Final    BAA BCAV Performed at [facility] [address], [place], [place] [address]    Culture   Final    NO GROWTH 2 DAYS Performed at [facility] [address] [place], [place] [address]    Report Status PENDING  Incomplete  Blood Culture (routine x 2)     Status: Abnormal (Preliminary result)   Collection Time: 03/23/18  2:24 PM  Result Value Ref Range Status   Specimen Description   Final    BLOOD LEFT HAND Performed at [facility] [address], [place], [place] [address]    Special Requests   Final    BAA BCAV Performed at [facility] [address], [place], [place] [address]    Culture  Setup Time   Final    GRAM NEGATIVE RODS ANAEROBIC BOTTLE ONLY CRITICAL VALUE NOTED.  VALUE IS CONSISTENT WITH PREVIOUSLY REPORTED AND CALLED VALUE. Performed at [facility], [place] [address], [place], [place] [address]    Culture ESCHERICHIA COLI (A)  Final   Report Status PENDING  Incomplete  Blood Culture (routine x 2)     Status: Abnormal (Preliminary result)   Collection Time: 03/23/18  2:24 PM  Result Value Ref Range Status   Specimen Description   Final    BLOOD RIGHT ARM Performed at [facility] [address] [place], [place] [address]    Special Requests   Final    BAA BCAV Performed at [facility] [address], [place], [place] [address]    Culture  Setup Time   Final    GRAM NEGATIVE RODS IN BOTH AEROBIC AND ANAEROBIC BOTTLES CRITICAL RESULT CALLED TO, READ BACK BY AND VERIFIED WITH: PHARMD L POINDEXTER [id number] MLM Performed at [facility], [place] [address], [place],  [address]    Culture ESCHERICHIA COLI (A)  Final   Report Status PENDING  Incomplete  Blood Culture ID Panel (Reflexed)     Status: Abnormal   Collection Time: 03/23/18  2:24 PM  Result Value Ref Range Status   Enterococcus species NOT DETECTED NOT DETECTED Final   Listeria monocytogenes NOT DETECTED NOT DETECTED Final   Staphylococcus species NOT DETECTED NOT DETECTED Final   Staphylococcus aureus (BCID) NOT DETECTED NOT DETECTED Final   Streptococcus species NOT DETECTED NOT DETECTED Final   Streptococcus agalactiae NOT DETECTED NOT DETECTED Final   Streptococcus pneumoniae NOT DETECTED NOT DETECTED Final   Streptococcus pyogenes NOT DETECTED NOT DETECTED Final   Acinetobacter baumannii NOT DETECTED NOT DETECTED Final   Enterobacteriaceae species DETECTED (A) NOT DETECTED Final    Comment: Enterobacteriaceae represent a large family of gram-negative bacteria, not a single organism. CRITICAL RESULT CALLED TO, READ BACK BY AND VERIFIED WITH: PHARMD L POINDEXTER [phone] MLM    Enterobacter cloacae complex NOT DETECTED NOT DETECTED Final   Escherichia coli DETECTED (A) NOT DETECTED Final    Comment: CRITICAL RESULT CALLED TO, READ BACK BY AND VERIFIED WITH: PHARMD L POINDEXTER [phone] MLM    Klebsiella oxytoca NOT DETECTED NOT DETECTED Final   Klebsiella pneumoniae NOT DETECTED NOT DETECTED Final   Proteus species NOT DETECTED NOT DETECTED Final   Serratia marcescens NOT DETECTED NOT DETECTED Final   Carbapenem resistance NOT DETECTED NOT DETECTED Final    Haemophilus influenzae NOT DETECTED NOT DETECTED Final   Neisseria meningitidis NOT DETECTED NOT DETECTED Final   Pseudomonas  aeruginosa NOT DETECTED NOT DETECTED Final   Candida albicans NOT DETECTED NOT DETECTED Final   Candida glabrata NOT DETECTED NOT DETECTED Final   Candida krusei NOT DETECTED NOT DETECTED Final   Candida parapsilosis NOT DETECTED NOT DETECTED Final   Candida tropicalis NOT DETECTED NOT DETECTED Final    Comment: Performed at [facility], [place] [address] [place], [place] [address]  MRSA PCR Screening     Status: Abnormal   Collection Time: 03/24/18  2:59 AM  Result Value Ref Range Status   MRSA by PCR POSITIVE (A) NEGATIVE Final    Comment:        The GeneXpert MRSA Assay (FDA approved for NASAL specimens only), is one component of a comprehensive MRSA colonization surveillance program. It is not intended to diagnose MRSA infection nor to guide or monitor treatment for MRSA infections. RESULT CALLED TO, READ BACK BY AND VERIFIED WITH: E CAUDLE,RN 03/24/18 0445 RHOLMES Performed at [facility], [place] [address], [place], [place] [address]      Medications:   . Chlorhexidine Gluconate Cloth  6 each Topical Q0600  . enoxaparin (LOVENOX) injection  60 mg Subcutaneous Q24H  . folic acid  1 mg Oral Daily  . gabapentin  300 mg Oral BID  . multivitamin with minerals  1 tablet Oral Daily  . mupirocin ointment  1 application Nasal BID  . polyethylene glycol  17 g Oral Daily  . thiamine  100 mg Oral Daily   Or  . thiamine  100 mg Intravenous Daily   Continuous Infusions: . cefTRIAXone (ROCEPHIN)  IV 2 g (03/25/18 1248)      LOS: 3 days   [facility] Pager [phone]  *Please refer to [email or web address], password [email or web address] to get updated schedule on who will round on this patient, as hospitalists switch teams weekly. If 7PM-7AM, please contact night-coverage at [email or web address], password [email or web address] for any overnight  needs.  03/26/2018, 9:59 AM

## 2018-03-27 DIAGNOSIS — F315 Bipolar disorder, current episode depressed, severe, with psychotic features: Secondary | ICD-10-CM

## 2018-03-27 DIAGNOSIS — F1120 Opioid dependence, uncomplicated: Secondary | ICD-10-CM

## 2018-03-27 DIAGNOSIS — F4310 Post-traumatic stress disorder, unspecified: Secondary | ICD-10-CM

## 2018-03-27 DIAGNOSIS — F1920 Other psychoactive substance dependence, uncomplicated: Secondary | ICD-10-CM

## 2018-03-27 LAB — CBC
HCT: 45.1 % (ref 36.0–46.0)
Hemoglobin: 14 g/dL (ref 12.0–15.0)
MCH: 29.4 pg (ref 26.0–34.0)
MCHC: 31 g/dL (ref 30.0–36.0)
MCV: 94.7 fL (ref 80.0–100.0)
PLATELETS: 748 10*3/uL — AB (ref 150–400)
RBC: 4.76 MIL/uL (ref 3.87–5.11)
RDW: 14.4 % (ref 11.5–15.5)
WBC: 12.7 10*3/uL — ABNORMAL HIGH (ref 4.0–10.5)
nRBC: 0 % (ref 0.0–0.2)

## 2018-03-27 MED ORDER — LORAZEPAM 1 MG PO TABS
1.0000 mg | ORAL_TABLET | Freq: Three times a day (TID) | ORAL | Status: AC
  Administered 2018-03-29 (×3): 1 mg via ORAL
  Filled 2018-03-27 (×3): qty 1

## 2018-03-27 MED ORDER — NICOTINE POLACRILEX 2 MG MT GUM
2.0000 mg | CHEWING_GUM | OROMUCOSAL | Status: DC | PRN
  Administered 2018-03-29 – 2018-04-03 (×7): 2 mg via ORAL
  Filled 2018-03-27 (×4): qty 1
  Filled 2018-03-27: qty 20

## 2018-03-27 MED ORDER — HYDROXYZINE HCL 50 MG PO TABS
50.0000 mg | ORAL_TABLET | Freq: Four times a day (QID) | ORAL | Status: DC | PRN
  Administered 2018-03-28 – 2018-04-02 (×6): 50 mg via ORAL
  Filled 2018-03-27 (×3): qty 1
  Filled 2018-03-27: qty 10
  Filled 2018-03-27 (×3): qty 1

## 2018-03-27 MED ORDER — LOPERAMIDE HCL 2 MG PO CAPS: 2.0000 mg | ORAL_CAPSULE | ORAL | Status: AC | PRN

## 2018-03-27 MED ORDER — LORAZEPAM 1 MG PO TABS
1.0000 mg | ORAL_TABLET | Freq: Four times a day (QID) | ORAL | Status: AC | PRN
  Filled 2018-03-27: qty 1

## 2018-03-27 MED ORDER — THIAMINE HCL 100 MG/ML IJ SOLN
100.0000 mg | Freq: Once | INTRAMUSCULAR | Status: AC
  Administered 2018-03-27: 100 mg via INTRAMUSCULAR
  Filled 2018-03-27: qty 2

## 2018-03-27 MED ORDER — RISPERIDONE 1 MG PO TABS
1.0000 mg | ORAL_TABLET | Freq: Every day | ORAL | Status: DC
  Administered 2018-03-27 – 2018-03-29 (×3): 1 mg via ORAL
  Filled 2018-03-27 (×6): qty 1

## 2018-03-27 MED ORDER — LORAZEPAM 1 MG PO TABS
1.0000 mg | ORAL_TABLET | Freq: Four times a day (QID) | ORAL | Status: AC
  Administered 2018-03-27 – 2018-03-28 (×6): 1 mg via ORAL
  Filled 2018-03-27 (×6): qty 1

## 2018-03-27 MED ORDER — DULOXETINE HCL 30 MG PO CPEP
30.0000 mg | ORAL_CAPSULE | Freq: Every day | ORAL | Status: DC
  Administered 2018-03-27 – 2018-04-03 (×8): 30 mg via ORAL
  Filled 2018-03-27: qty 1
  Filled 2018-03-27: qty 14
  Filled 2018-03-27 (×11): qty 1

## 2018-03-27 MED ORDER — ONDANSETRON 4 MG PO TBDP
4.0000 mg | ORAL_TABLET | Freq: Four times a day (QID) | ORAL | Status: AC | PRN
  Administered 2018-03-30: 4 mg via ORAL
  Filled 2018-03-27: qty 1

## 2018-03-27 MED ORDER — LORAZEPAM 1 MG PO TABS: 1.0000 mg | ORAL_TABLET | Freq: Every day | ORAL | Status: DC

## 2018-03-27 MED ORDER — VITAMIN B-1 100 MG PO TABS: 100.0000 mg | ORAL_TABLET | Freq: Every day | ORAL | Status: DC

## 2018-03-27 MED ORDER — LORAZEPAM 1 MG PO TABS
1.0000 mg | ORAL_TABLET | Freq: Two times a day (BID) | ORAL | Status: DC
  Administered 2018-03-30: 1 mg via ORAL
  Filled 2018-03-27: qty 1

## 2018-03-27 NOTE — Tx Team (Signed)
Interdisciplinary Treatment and Diagnostic Plan Update  03/27/2018 Time of Session: 0830AM Sarah Cervantes MRN: [id number]  Principal Diagnosis: Bipolar 2 Disorder  Secondary Diagnoses: Active Problems:   Bipolar 2 disorder (HCC)   Current Medications:  Current Facility-Administered Medications  Medication Dose Route Frequency Provider Last Rate Last Dose  . acetaminophen (TYLENOL) tablet 650 mg  650 mg Oral Q6H PRN Rankin, Shuvon B, NP      . folic acid (FOLVITE) tablet 1 mg  1 mg Oral Daily Rankin, Shuvon B, NP   1 mg at 03/27/18 0810  . gabapentin (NEURONTIN) capsule 300 mg  300 mg Oral BID Rankin, Shuvon B, NP   300 mg at 03/27/18 0809  . hydrocortisone (ANUSOL-HC) 2.5 % rectal cream   Rectal TID Rankin, Shuvon B, NP      . hydrOXYzine (ATARAX/VISTARIL) tablet 25 mg  25 mg Oral Q6H PRN Dara Hoyer, PA-C   25 mg at 03/26/18 2200  . LORazepam (ATIVAN) tablet 1 mg  1 mg Oral BID Dara Hoyer, PA-C   1 mg at 03/27/18 2703  . multivitamin with minerals tablet 1 tablet  1 tablet Oral Daily Rankin, Shuvon B, NP   1 tablet at 03/27/18 0809  . mupirocin ointment (BACTROBAN) 2 % 1 application  1 application Nasal BID Rankin, Shuvon B, NP   1 application at 50/09/38 0810  . polyethylene glycol (MIRALAX / GLYCOLAX) packet 17 g  17 g Oral Daily Rankin, Shuvon B, NP   17 g at 03/27/18 0810  . sulfamethoxazole-trimethoprim (BACTRIM DS,SEPTRA DS) 800-160 MG per tablet 1 tablet  1 tablet Oral Q12H Dara Hoyer, PA-C   1 tablet at 03/27/18 0810  . thiamine (VITAMIN B-1) tablet 100 mg  100 mg Oral Daily Rankin, Shuvon B, NP   100 mg at 03/27/18 0810  . traZODone (DESYREL) tablet 50 mg  50 mg Oral QHS PRN,MR X 1 Dara Hoyer, PA-C   50 mg at 03/26/18 2200   PTA Medications: Medications Prior to Admission  Medication Sig Dispense Refill Last Dose  . amoxicillin (AMOXIL) 875 MG tablet Take 1 tablet (875 mg total) by mouth 2 (two) times daily. 24 tablet 0   . gabapentin (NEURONTIN)  300 MG capsule Take 1 capsule (300 mg total) by mouth 2 (two) times daily.     . hydrocortisone (ANUSOL-HC) 2.5 % rectal cream Place rectally 3 (three) times daily. 30 g 0     Patient Stressors: Marital or family conflict Medication change or noncompliance Substance abuse  Patient Strengths: Average or above average intelligence General fund of knowledge Supportive family/friends  Treatment Modalities: Medication Management, Group therapy, Case management,  1 to 1 session with clinician, Psychoeducation, Recreational therapy.   Physician Treatment Plan for Primary Diagnosis: Bipolar 2 Disorder  Medication Management: Evaluate patient's response, side effects, and tolerance of medication regimen.  Therapeutic Interventions: 1 to 1 sessions, Unit Group sessions and Medication administration.  Evaluation of Outcomes: Progressing  Physician Treatment Plan for Secondary Diagnosis: Active Problems:   Bipolar 2 disorder ([place])   Medication Management: Evaluate patient's response, side effects, and tolerance of medication regimen.  Therapeutic Interventions: 1 to 1 sessions, Unit Group sessions and Medication administration.  Evaluation of Outcomes: Progressing   RN Treatment Plan for Primary Diagnosis: Bipolar 2 Disorder Long Term Goal(s): Knowledge of disease and therapeutic regimen to maintain health will improve  Short Term Goals: Ability to remain free from injury will improve, Ability to disclose and discuss suicidal ideas and  Ability to identify and develop effective coping behaviors will improve  Medication Management: RN will administer medications as ordered by provider, will assess and evaluate patient's response and provide education to patient for prescribed medication. RN will report any adverse and/or side effects to prescribing provider.  Therapeutic Interventions: 1 on 1 counseling sessions, Psychoeducation, Medication administration, Evaluate responses to treatment,  Monitor vital signs and CBGs as ordered, Perform/monitor CIWA, COWS, AIMS and Fall Risk screenings as ordered, Perform wound care treatments as ordered.  Evaluation of Outcomes: Progressing   LCSW Treatment Plan for Primary Diagnosis:Bipolar 2 Disorder Long Term Goal(s): Safe transition to appropriate next level of care at discharge, Engage patient in therapeutic group addressing interpersonal concerns.  Short Term Goals: Engage patient in aftercare planning with referrals and resources, Facilitate patient progression through stages of change regarding substance use diagnoses and concerns and Identify triggers associated with mental health/substance abuse issues  Therapeutic Interventions: Assess for all discharge needs, 1 to 1 time with Social worker, Explore available resources and support systems, Assess for adequacy in community support network, Educate family and significant other(s) on suicide prevention, Complete Psychosocial Assessment, Interpersonal group therapy.  Evaluation of Outcomes: Progressing   Progress in Treatment: Attending groups: Yes. Participating in groups: Yes. Taking medication as prescribed: Yes. Toleration medication: Yes. Family/Significant other contact made: No, will contact:  family member if pt consents to collateral contact.  Patient understands diagnosis: Yes. Discussing patient identified problems/goals with staff: Yes. Medical problems stabilized or resolved: Yes. Denies suicidal/homicidal ideation: Yes Issues/concerns per patient self-inventory: No. Other: n/a  New problem(s) identified: No, Describe:  n/a  New Short Term/Long Term Goal(s): detox, medication management for mood stabilization; elimination of SI thoughts; development of comprehensive mental wellness/sobriety plan.   Patient Goals:  "I need help. I need to get into treatment."   Discharge Plan or Barriers: CSW assessing for appropriate referrals. [facility] and [facility] Referrals in  progress. [name] appt made for 11/8. [facility] pamphlet, Mobile Crisis information, and AA/NA information provided to patient for additional community support and resources.   Reason for Continuation of Hospitalization: Anxiety Depression Medication stabilization Suicidal ideation Withdrawal symptoms  Estimated Length of Stay: Tuesday, 04/02/18  Attendees: Patient: 03/27/2018 2:37 PM  Physician: Dr. Parke Poisson MD; Dr. Nancy Fetter MD 03/27/2018 2:37 PM  Nursing: Nicoletta Dress RN 03/27/2018 2:37 PM  RN Care Manager:x 03/27/2018 2:37 PM  Social Worker: Janice Norrie LCSW 03/27/2018 2:37 PM  Recreational Therapist: x 03/27/2018 2:37 PM  Other: Darnelle Maffucci Money NP 03/27/2018 2:37 PM  Other:  03/27/2018 2:37 PM  Other: 03/27/2018 2:37 PM    Scribe for Treatment Team: [name], LCSW 03/27/2018 2:37 PM

## 2018-03-27 NOTE — Progress Notes (Signed)
Recreation Therapy Notes  Date: 10.30.19 Time: 0930 Location: [address] Dayroom  Group Topic: Stress Management  Goal Area(s) Addresses:  Patient will verbalize importance of using healthy stress management.  Patient will identify positive emotions associated with healthy stress management.   Intervention: Stress Management  Activity :  Guided Imagery.  LRT introduced the stress management technique of guided imagery.  LRT read Cervantes script that allowed patients to envision seeing the starry sky at night.  Patients were to follow along as the guided imagery was read to fully participate in the meditation.  Education:  Stress Management, Discharge Planning.   Education Outcome: Acknowledges edcuation/In group clarification offered/Needs additional education  Clinical Observations/Feedback: Pt did not attend group.     Victorino Sparrow, LRT/CTRS         Sarah Cervantes, Sarah Cervantes 03/27/2018 11:05 AM

## 2018-03-27 NOTE — Plan of Care (Signed)
Problem: Safety: Goal: Periods of time without injury will increase Outcome: Progressing   Problem: Medication: Goal: Compliance with prescribed medication regimen will improve Outcome: Progressing  DAR Note: Pt A & O X4. Denies HI, AVH and pain when assessed. Endorsed passive SI without plan at this time. Verbally contracts for safety. Presents guarded with depressed mood and affect. Minimal on interaction, eye contact is fair but brief. Pt took her medications as ordered when offered. Denies side effects. Pt did not attend groups despite multiple prompts. Emotional support and availability provided to pt. All medications administered per MD's orders with verbal education and effects monitored. Encouraged pt to voice concerns, attend to ADLs and comply with current treatment regimen including groups. Safety checks continues at Q 15 minutes intervals without self harm gestures or outburst to note thus far.  Pt went off unit for meals. Tolerates all PO intake well. POC continues for safety and mood stability.

## 2018-03-27 NOTE — Progress Notes (Signed)
Patient did attend the evening speaker NA meeting.  
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
## 2018-03-27 NOTE — [facility] Suicide Risk Assessment (Signed)
[facility] Admission Suicide Risk Assessment   Nursing information obtained from:  Patient Demographic factors:  Unemployed, Low socioeconomic status, Gay, lesbian, or bisexual orientation Current Mental Status:  Suicidal ideation indicated by patient, Suicide plan, Self-harm thoughts, Intention to act on suicide plan Loss Factors:  Decrease in vocational status, Legal issues, Financial problems / change in socioeconomic status, Loss of significant relationship Historical Factors:  Prior suicide attempts Risk Reduction Factors:  Sense of responsibility to family, Positive coping skills or problem solving skills  Total Time spent with patient: 1 hour Principal Problem: Polysubstance dependence including opioid drug with daily use ([place]) Diagnosis:   Patient Active Problem List   Diagnosis Date Noted  . Bipolar I disorder, current or most recent episode depressed, with psychotic features ([place]) [F31.5] 03/26/2018  . Bacteremia due to Gram-negative bacteria [R78.81] 03/25/2018  . Cocaine abuse with cocaine-induced mood disorder ([place]) [F14.14] 03/24/2018  . PTSD (post-traumatic stress disorder) [F43.10] 03/24/2018  . Bipolar II disorder ([place]) [F31.81] 03/24/2018  . Sepsis, Gram negative ([place]) [A41.50] 03/23/2018  . Alcohol abuse with intoxication ([place]) [F10.129] 03/23/2018  . Polysubstance dependence including opioid drug with daily use ([place]) [F11.20, F19.20] 03/23/2018   Subjective Data: see H&P  Continued Clinical Symptoms:  Alcohol Use Disorder Identification Test Final Score (AUDIT): 32 The "Alcohol Use Disorders Identification Test", Guidelines for Use in Primary Care, Second Edition.  World Pharmacologist [facility]). Score between 0-7:  no or low risk or alcohol related problems. Score between 8-15:  moderate risk of alcohol related problems. Score between 16-19:  high risk of alcohol related problems. Score 20 or above:  warrants further diagnostic evaluation for alcohol dependence and  treatment.   CLINICAL FACTORS:   Severe Anxiety and/or Agitation Bipolar Disorder:   Depressive phase Alcohol/Substance Abuse/Dependencies More than one psychiatric diagnosis Currently Psychotic Unstable or Poor Therapeutic Relationship Previous Psychiatric Diagnoses and Treatments Medical Diagnoses and Treatments/Surgeries   Psychiatric Specialty Exam: Physical Exam  Nursing note and vitals reviewed.     Blood pressure (!) 148/94, pulse (!) 105, temperature 98.2 F (36.8 C), temperature source Oral, resp. rate 20, height 5\' 4"  (1.626 m), weight 135.6 kg, last menstrual period 03/23/2018.Body mass index is 51.32 kg/m.     COGNITIVE FEATURES THAT CONTRIBUTE TO RISK:  None    SUICIDE RISK:   Severe:  Frequent, intense, and enduring suicidal ideation, specific plan, no subjective intent, but some objective markers of intent (i.e., choice of lethal method), the method is accessible, some limited preparatory behavior, evidence of impaired self-control, severe dysphoria/symptomatology, multiple risk factors present, and few if any protective factors, particularly a lack of social support.  PLAN OF CARE: see H&P  I certify that inpatient services furnished can reasonably be expected to improve the patient's condition.   Pennelope Bracken, MD 03/27/2018, 3:59 PM

## 2018-03-27 NOTE — BHH Suicide Risk Assessment (Signed)
[place] INPATIENT:  Family/Significant Other Suicide Prevention Education  Suicide Prevention Education:  Patient Refusal for Family/Significant Other Suicide Prevention Education: The patient Sarah Cervantes has refused to provide written consent for family/significant other to be provided Family/Significant Other Suicide Prevention Education during admission and/or prior to discharge.  Physician notified.  Lawana Pai, MSW Intern 03/27/2018, 3:49 PM
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
## 2018-03-28 LAB — CULTURE, BLOOD (SINGLE): Culture: NO GROWTH

## 2018-03-28 LAB — CBC
HCT: 43.5 % (ref 36.0–46.0)
HEMOGLOBIN: 13.5 g/dL (ref 12.0–15.0)
MCH: 29.4 pg (ref 26.0–34.0)
MCHC: 31 g/dL (ref 30.0–36.0)
MCV: 94.8 fL (ref 80.0–100.0)
Platelets: 692 10*3/uL — ABNORMAL HIGH (ref 150–400)
RBC: 4.59 MIL/uL (ref 3.87–5.11)
RDW: 14.6 % (ref 11.5–15.5)
WBC: 14.6 10*3/uL — AB (ref 4.0–10.5)
nRBC: 0 % (ref 0.0–0.2)

## 2018-03-28 MED ORDER — NAPROXEN 500 MG PO TABS
500.0000 mg | ORAL_TABLET | Freq: Two times a day (BID) | ORAL | Status: AC | PRN
  Administered 2018-03-29: 500 mg via ORAL
  Filled 2018-03-28: qty 1

## 2018-03-28 MED ORDER — CLONIDINE HCL 0.1 MG PO TABS
0.1000 mg | ORAL_TABLET | Freq: Four times a day (QID) | ORAL | Status: DC
  Administered 2018-03-28 – 2018-03-29 (×2): 0.1 mg via ORAL
  Filled 2018-03-28 (×7): qty 1

## 2018-03-28 MED ORDER — CLONIDINE HCL 0.1 MG PO TABS: 0.1000 mg | ORAL_TABLET | Freq: Every day | ORAL | Status: DC

## 2018-03-28 MED ORDER — CLONIDINE HCL 0.1 MG PO TABS
0.1000 mg | ORAL_TABLET | ORAL | Status: DC
  Administered 2018-03-30: 0.1 mg via ORAL
  Filled 2018-03-28 (×3): qty 1

## 2018-03-28 MED ORDER — METHOCARBAMOL 500 MG PO TABS: 500.0000 mg | ORAL_TABLET | Freq: Three times a day (TID) | ORAL | Status: AC | PRN

## 2018-03-28 MED ORDER — DICYCLOMINE HCL 20 MG PO TABS: 20.0000 mg | ORAL_TABLET | Freq: Four times a day (QID) | ORAL | Status: AC | PRN

## 2018-03-28 MED ORDER — TRAZODONE HCL 150 MG PO TABS
150.0000 mg | ORAL_TABLET | Freq: Every day | ORAL | Status: DC
  Administered 2018-03-28 – 2018-03-30 (×3): 150 mg via ORAL
  Filled 2018-03-28 (×5): qty 1

## 2018-03-28 NOTE — Progress Notes (Signed)
Adult Psychoeducational Group Note  Date:  03/28/2018 Time:  10:17 PM  Group Topic/Focus:  Wrap-Up Group:   The focus of this group is to help patients review their daily goal of treatment and discuss progress on daily workbooks.  Participation Level:  Active  Participation Quality:  Appropriate  Affect:  Appropriate  Cognitive:  Alert and Oriented  Insight: Appropriate  Engagement in Group:  Developing/Improving  Modes of Intervention:  Clarification, Exploration and Support  Additional Comments:  Pt verbalized that something positive is that she has a little bit of hope.  Avaree Gilberti, Patrick North 03/28/2018, 10:17 PM

## 2018-03-28 NOTE — Progress Notes (Addendum)
[facility] MD Progress Note  03/28/2018 2:55 PM Zameria Vogl  MRN:  [id number]  Subjective:  Gloris reports, "I have bad headache now. My mood is not good. I'm having alcohol & heroin withdrawal symptoms. I am still having suicidal thoughts. I am also hearing voices. I am not sleeping well at night & last night was horrible. I don't have any appetite. Life sucks. I have been trying to get myself out of this life, but everything that I had tried has been unsuccessful  [name] "[name]" Rhinehart is a 37 y/o F with history of PTSD, bipolar, and polysubstance abuse who was admitted voluntarily from [facility] where she presented with chest pain via EMS and later she reported worsening depression, SI, suicide attempt via overdose multiple times over the past week, AH, VH, and worsening abuse of multiple illicit substances. She was medically cleared and then transferred to [facility] for additional treatment and stabilization. Upon initial interview, pt shares about her reasons for admission, "I thought I was having a heart attack. I hate myself. I don't want to be here any more." Pt clarifies that she means she no longer wants to be living. She confirms that she has been trying to overdose on multiple medications over the past week, and she only called emergency services due to severe chest pain. Pt reports stressors of poor social support and homelessness. She endorses insomnia (initial), anhedonia, guilty feelings, low energy, poor concentration, and fluctuant appetite. She endorses SI with plan to overdose. She denies HI. She endorses AH of hearing voices which command her to overdose. She endorses VH of seeing "bugs" crawling on the walls while she was in the ED.  Principal Problem: Polysubstance dependence including opioid drug with daily use ([place])  Diagnosis:   Patient Active Problem List   Diagnosis Date Noted  . Bipolar I disorder, current or most recent episode depressed, with psychotic features ([place]) [F31.5]  03/26/2018  . Bacteremia due to Gram-negative bacteria [R78.81] 03/25/2018  . Cocaine abuse with cocaine-induced mood disorder ([employer]) [F14.14] 03/24/2018  . PTSD (post-traumatic stress disorder) [F43.10] 03/24/2018  . Bipolar II disorder ([place]) [F31.81] 03/24/2018  . Sepsis, Gram negative ([place]) [A41.50] 03/23/2018  . Alcohol abuse with intoxication ([place]) [F10.129] 03/23/2018  . Polysubstance dependence including opioid drug with daily use ([place]) [F11.20, F19.20] 03/23/2018   Total Time spent with patient: 25 minutes  Past Psychiatric History: See H&P  Past Medical History:  Past Medical History:  Diagnosis Date  . Cocaine abuse, daily use ([place]) 03/23/2018  . ETOH abuse 03/23/2018  . Heroin abuse ([place]) 03/23/2018   History reviewed. No pertinent surgical history. Family History: History reviewed. No pertinent family history.  Family Psychiatric  History: See H&P  Social History:  Social History   Substance and Sexual Activity  Alcohol Use Yes   Comment: Occ social drinker     Social History   Substance and Sexual Activity  Drug Use Yes   Comment: Heroin and Cocain on 2018/03/23. Pt uses every night    Social History   Socioeconomic History  . Marital status: Single    Spouse name: Not on file  . Number of children: Not on file  . Years of education: Not on file  . Highest education level: Not on file  Occupational History  . Not on file  Social Needs  . Financial resource strain: Not on file  . Food insecurity:    Worry: Not on file    Inability: Not on file  . Transportation needs:  Medical: Not on file    Non-medical: Not on file  Tobacco Use  . Smoking status: Current Every Day Smoker    Packs/day: 1.00    Years: 30.00    Pack years: 30.00  . Smokeless tobacco: Never Used  Substance and Sexual Activity  . Alcohol use: Yes    Comment: Occ social drinker  . Drug use: Yes    Comment: Heroin and Cocain on 2018/03/23. Pt uses every night  . Sexual  activity: Yes    Birth control/protection: None  Lifestyle  . Physical activity:    Days per week: Not on file    Minutes per session: Not on file  . Stress: Not on file  Relationships  . Social connections:    Talks on phone: Not on file    Gets together: Not on file    Attends religious service: Not on file    Active member of club or organization: Not on file    Attends meetings of clubs or organizations: Not on file    Relationship status: Not on file  Other Topics Concern  . Not on file  Social History Narrative  . Not on file   Additional Social History:  Pain Medications: see mar Prescriptions: see mar Over the Counter: see mar History of alcohol / drug use?: Yes Negative Consequences of Use: Legal, Personal relationships Withdrawal Symptoms: Fever / Chills, Irritability, Nausea / Vomiting  Sleep: Poor  Appetite:  Fair  Current Medications: Current Facility-Administered Medications  Medication Dose Route Frequency Provider Last Rate Last Dose  . acetaminophen (TYLENOL) tablet 650 mg  650 mg Oral Q6H PRN Rankin, Shuvon B, NP   650 mg at 03/28/18 1212  . DULoxetine (CYMBALTA) DR capsule 30 mg  30 mg Oral Daily Pennelope Bracken, MD   30 mg at 03/28/18 0753  . folic acid (FOLVITE) tablet 1 mg  1 mg Oral Daily Rankin, Shuvon B, NP   1 mg at 03/28/18 0753  . gabapentin (NEURONTIN) capsule 300 mg  300 mg Oral BID Rankin, Shuvon B, NP   300 mg at 03/28/18 0753  . hydrocortisone (ANUSOL-HC) 2.5 % rectal cream   Rectal TID Rankin, Shuvon B, NP   1 application at 40/98/11 1713  . hydrOXYzine (ATARAX/VISTARIL) tablet 50 mg  50 mg Oral Q6H PRN Pennelope Bracken, MD      . loperamide (IMODIUM) capsule 2-4 mg  2-4 mg Oral PRN Pennelope Bracken, MD      . LORazepam (ATIVAN) tablet 1 mg  1 mg Oral Q6H PRN Pennelope Bracken, MD      . LORazepam (ATIVAN) tablet 1 mg  1 mg Oral QID Pennelope Bracken, MD   1 mg at 03/28/18 1213   Followed by  .  [START ON 03/29/2018] LORazepam (ATIVAN) tablet 1 mg  1 mg Oral TID Pennelope Bracken, MD       Followed by  . [START ON 03/30/2018] LORazepam (ATIVAN) tablet 1 mg  1 mg Oral BID Pennelope Bracken, MD       Followed by  . [START ON 03/31/2018] LORazepam (ATIVAN) tablet 1 mg  1 mg Oral Daily [place], Randa Ngo, MD      . multivitamin with minerals tablet 1 tablet  1 tablet Oral Daily Rankin, Shuvon B, NP   1 tablet at 03/28/18 0753  . mupirocin ointment (BACTROBAN) 2 % 1 application  1 application Nasal BID Rankin, Shuvon B, NP   1 application at 91/47/82  1713  . nicotine polacrilex (NICORETTE) gum 2 mg  2 mg Oral PRN [place], Myer Peer, MD      . ondansetron (ZOFRAN-ODT) disintegrating tablet 4 mg  4 mg Oral Q6H PRN Pennelope Bracken, MD      . polyethylene glycol (MIRALAX / GLYCOLAX) packet 17 g  17 g Oral Daily Rankin, Shuvon B, NP   17 g at 03/27/18 0810  . risperiDONE (RISPERDAL) tablet 1 mg  1 mg Oral QHS Pennelope Bracken, MD   1 mg at 03/27/18 2321  . sulfamethoxazole-trimethoprim (BACTRIM DS,SEPTRA DS) 800-160 MG per tablet 1 tablet  1 tablet Oral Q12H Dara Hoyer, PA-C   1 tablet at 03/28/18 0753  . thiamine (VITAMIN B-1) tablet 100 mg  100 mg Oral Daily Rankin, Shuvon B, NP   100 mg at 03/28/18 0753  . traZODone (DESYREL) tablet 50 mg  50 mg Oral QHS PRN,MR X 1 Dara Hoyer, PA-C   50 mg at 03/27/18 2321   Lab Results:  Results for orders placed or performed during the hospital encounter of 03/26/18 (from the past 48 hour(s))  CBC     Status: Abnormal   Collection Time: 03/27/18  6:24 AM  Result Value Ref Range   WBC 12.7 (H) 4.0 - 10.5 K/uL   RBC 4.76 3.87 - 5.11 MIL/uL   Hemoglobin 14.0 12.0 - 15.0 g/dL   HCT 45.1 36.0 - 46.0 %   MCV 94.7 80.0 - 100.0 fL   MCH 29.4 26.0 - 34.0 pg   MCHC 31.0 30.0 - 36.0 g/dL   RDW 14.4 11.5 - 15.5 %   Platelets 748 (H) 150 - 400 K/uL   nRBC 0.0 0.0 - 0.2 %    Comment: Performed at [facility], [place] [address], [place], [place] [address]  CBC     Status: Abnormal   Collection Time: 03/28/18  6:32 AM  Result Value Ref Range   WBC 14.6 (H) 4.0 - 10.5 K/uL   RBC 4.59 3.87 - 5.11 MIL/uL   Hemoglobin 13.5 12.0 - 15.0 g/dL   HCT 43.5 36.0 - 46.0 %   MCV 94.8 80.0 - 100.0 fL   MCH 29.4 26.0 - 34.0 pg   MCHC 31.0 30.0 - 36.0 g/dL   RDW 14.6 11.5 - 15.5 %   Platelets 692 (H) 150 - 400 K/uL   nRBC 0.0 0.0 - 0.2 %    Comment: Performed at [facility], [place] [address], [place], [place] [address]   Blood Alcohol level:  Lab Results  Component Value Date   ETH <10 33/82/5053    Metabolic Disorder Labs: Lab Results  Component Value Date   HGBA1C 5.6 03/24/2018   MPG 114.02 03/24/2018   No results found for: PROLACTIN No results found for: CHOL, TRIG, HDL, CHOLHDL, VLDL, LDLCALC  Physical Findings: AIMS: Facial and Oral Movements Muscles of Facial Expression: None, normal Lips and Perioral Area: None, normal Jaw: None, normal Tongue: None, normal,Extremity Movements Upper (arms, wrists, hands, fingers): None, normal Lower (legs, knees, ankles, toes): None, normal, Trunk Movements Neck, shoulders, hips: None, normal, Overall Severity Severity of abnormal movements (highest score from questions above): None, normal Incapacitation due to abnormal movements: None, normal Patient's awareness of abnormal movements (rate only patient's report): No Awareness, Dental Status Current problems with teeth and/or dentures?: No Does patient usually wear dentures?: No  CIWA:  CIWA-Ar Total: 2 COWS:  COWS Total Score: 7  Musculoskeletal: Strength &  Muscle Tone: within normal limits Gait & Station: normal Patient leans: N/A  Psychiatric Specialty Exam: Physical Exam  Nursing note and vitals reviewed.   Review of Systems  Psychiatric/Behavioral: Positive for depression, hallucinations, substance abuse and suicidal ideas. Negative for memory loss. The  patient is nervous/anxious and has insomnia.     Blood pressure (!) 134/104, pulse (!) 106, temperature 98.2 F (36.8 C), resp. rate 20, height 5\' 4"  (1.626 m), weight 135.6 kg, last menstrual period 03/23/2018.Body mass index is 51.32 kg/m.  General Appearance: Casual and Fairly Groomed  Eye Contact:  Fair  Speech:  Clear and Coherent and Normal Rate  Volume:  Normal  Mood:  Anxious, Depressed and Irritable  Affect:  Blunt, Depressed and Labile  Thought Process:  Coherent and Goal Directed  Orientation:  Full (Time, Place, and Person)  Thought Content:  Hallucinations: Auditory Visual  Suicidal Thoughts:  Yes.  with intent/plan  Homicidal Thoughts:  No  Memory:  Immediate;   Fair Recent;   Fair Remote;   Fair  Judgement:  Poor  Insight:  Lacking  Psychomotor Activity:  Normal  Concentration:  Concentration: Fair  Recall:  [employer] of Knowledge:  Fair  Language:  Fair  Akathisia:  No  Handed:    AIMS (if indicated):     Assets:  Resilience Social Support  ADL's:  Intact  Cognition:  WNL  Sleep:  Number of Hours: 5.5     Treatment Plan Summary: Daily contact with patient to assess and evaluate symptoms and progress in treatment and Medication management.  - Continue inpatient hospitalization.  - Will continue today 03/28/2018 plan as below except where it is noted.  Alcohol detox. - Continue CIWA with ativan.   Opioid detox. - Initiated Clonidine detox protocols.  Depression - Continue Cymbalta 30mg  po qDay.   - Agitation Continue gabapentin 200mg  po BID.   Mood control. - Continue risperdal 1mg  po qhs.   Insomnia. - Increased Trazodone from 50 mg po prn Q hs to Trazodone 150 mg po Q hs routinely.  - Continue all other current PRN's.  - Continue to ncourage participation in groups and therapeutic milieu.  - discharge disposition plan is ongoing.  Lindell Spar, NP, PMHNP, FNP-BC. 03/28/2018, 2:55 PM

## 2018-03-28 NOTE — Plan of Care (Addendum)
Patient was irritable upon approach this morning. Patient's mood got better throughout the day. Denies SI HI AVH. Denies pain, denies physical problems.  Safety is maintained with 15 minute checks. Will continue to monitor.  Problem: Activity: Goal: Sleeping patterns will improve Outcome: Progressing   Problem: Coping: Goal: Ability to demonstrate self-control will improve Outcome: Progressing   Problem: Education: Goal: Mental status will improve Outcome: Not Progressing   Problem: Activity: Goal: Interest or engagement in activities will improve Outcome: Not Progressing   Problem: Health Behavior/Discharge Planning: Goal: Identification of resources available to assist in meeting health care needs will improve Outcome: Not Progressing

## 2018-03-29 LAB — CBC
HCT: 40.5 % (ref 36.0–46.0)
HEMOGLOBIN: 12.7 g/dL (ref 12.0–15.0)
MCH: 29.5 pg (ref 26.0–34.0)
MCHC: 31.4 g/dL (ref 30.0–36.0)
MCV: 94 fL (ref 80.0–100.0)
NRBC: 0 % (ref 0.0–0.2)
PLATELETS: 591 10*3/uL — AB (ref 150–400)
RBC: 4.31 MIL/uL (ref 3.87–5.11)
RDW: 14.6 % (ref 11.5–15.5)
WBC: 10.1 10*3/uL (ref 4.0–10.5)

## 2018-03-29 MED ORDER — GABAPENTIN 300 MG PO CAPS
300.0000 mg | ORAL_CAPSULE | Freq: Two times a day (BID) | ORAL | Status: DC
  Administered 2018-03-29 – 2018-03-30 (×2): 300 mg via ORAL
  Filled 2018-03-29 (×4): qty 1

## 2018-03-29 NOTE — Plan of Care (Addendum)
Patient self inventory- Patient slept poorly last night, sleep medication was not helpful. Appetite has been fair, energy level low, and concentration poor. Patient endorses tremors, cravings, agitation, runny nose, chilling, nausea, and irritability. Depression, hopelessness, and anxiety rated 7, 6, 6. Patient rates headache pain 7/10. Patient's goal is to work on "life" and will accomplish this with "sleep."  Patient is compliant with medications prescribed per provider. No side effects noted. Patient has refused Clonidine due to it making her feel "funny."  Safety is maintained with 15 minute checks as well as environmental checks. Will continue to monitor.  Problem: Activity: Goal: Interest or engagement in activities will improve Outcome: Progressing   Problem: Coping: Goal: Ability to verbalize frustrations and anger appropriately will improve Outcome: Progressing Goal: Ability to demonstrate self-control will improve Outcome: Progressing   Problem: Physical Regulation: Goal: Ability to maintain clinical measurements within normal limits will improve Outcome: Progressing   Problem: Activity: Goal: Sleeping patterns will improve Outcome: Not Progressing

## 2018-03-29 NOTE — Progress Notes (Signed)
[facility] MD Progress Note  03/29/2018 1:55 PM [name]  MRN:  [id number]  Subjective:  [place] reports, "I'm having tremors, chills. That is the reason I could not go to the group milieu today. I don't like the Clonidine detox protocols. Clonidine makes me feel spaced out. As how of I'm feeling now, I don't really care about how I feel, if that makes any sense. I have always felt depressed & suicidal. Give me another day to see if I can bounce back. It usually takes 3 days for me to get out of withdrawal symptoms".  Sarah Cervantes is a 37 y/o F with history of PTSD, bipolar, and polysubstance abuse who was admitted voluntarily from [place] where she presented with chest pain via EMS and later she reported worsening depression, SI, suicide attempt via overdose multiple times over the past week, AH, VH, and worsening abuse of multiple illicit substances. She was medically cleared and then transferred to [facility] for additional treatment and stabilization. Upon initial interview, pt shares about her reasons for admission, "I thought I was having a heart attack. I hate myself. I don't want to be here any more." Pt clarifies that she means she no longer wants to be living. She confirms that she has been trying to overdose on multiple medications over the past week, and she only called emergency services due to severe chest pain. Pt reports stressors of poor social support and homelessness. She endorses insomnia (initial), anhedonia, guilty feelings, low energy, poor concentration, and fluctuant appetite. She endorses SI with plan to overdose. She denies HI. She endorses AH of hearing voices which command her to overdose. She endorses VH of seeing "bugs" crawling on the walls while she was in the ED.  Sarah Cervantes is seen, chart reviewed. The chart findings discussed with the treatment team. She was seen in her room lying down in bed. She is complaining of tremors & chills. She is also endorsing symptoms of  anxiety. She is only up to go to the cafeteria for meals. She has not been attending group sessions as of yet. She says it takes 3 days for her to get over substance withdrawal symptoms. She thinks she will be up & about by tomorrow. She currently denies any SIHI, AVH, delusional thoughts or paranoia. She does not appear to be responding to any internal stimuli. She has agreed to continue current plan of care except clonidine detox protocols. This is discontinued as she says it makes her space out.  Principal Problem: Polysubstance dependence including opioid drug with daily use ([place])  Diagnosis:   Patient Active Problem List   Diagnosis Date Noted  . Bipolar I disorder, current or most recent episode depressed, with psychotic features ([place]) [F31.5] 03/26/2018  . Bacteremia due to Gram-negative bacteria [R78.81] 03/25/2018  . Cocaine abuse with cocaine-induced mood disorder ([place]) [F14.14] 03/24/2018  . PTSD (post-traumatic stress disorder) [F43.10] 03/24/2018  . Bipolar II disorder ([place]) [F31.81] 03/24/2018  . Sepsis, Gram negative ([place]) [A41.50] 03/23/2018  . Alcohol abuse with intoxication ([place]) [F10.129] 03/23/2018  . Polysubstance dependence including opioid drug with daily use ([place]) [F11.20, F19.20] 03/23/2018   Total Time spent with patient: 25 minutes  Past Psychiatric History: See H&P  Past Medical History:  Past Medical History:  Diagnosis Date  . Cocaine abuse, daily use ([place]) 03/23/2018  . ETOH abuse 03/23/2018  . Heroin abuse ([place]) 03/23/2018   History reviewed. No pertinent surgical history. Family History: History reviewed. No pertinent family history.  Family Psychiatric  History: See H&P  Social History:  Social History   Substance and Sexual Activity  Alcohol Use Yes   Comment: Occ social drinker     Social History   Substance and Sexual Activity  Drug Use Yes   Comment: Heroin and Cocain on 2018/03/23. Pt uses every night    Social History    Socioeconomic History  . Marital status: Single    Spouse name: Not on file  . Number of children: Not on file  . Years of education: Not on file  . Highest education level: Not on file  Occupational History  . Not on file  Social Needs  . Financial resource strain: Not on file  . Food insecurity:    Worry: Not on file    Inability: Not on file  . Transportation needs:    Medical: Not on file    Non-medical: Not on file  Tobacco Use  . Smoking status: Current Every Day Smoker    Packs/day: 1.00    Years: 30.00    Pack years: 30.00  . Smokeless tobacco: Never Used  Substance and Sexual Activity  . Alcohol use: Yes    Comment: Occ social drinker  . Drug use: Yes    Comment: Heroin and Cocain on 2018/03/23. Pt uses every night  . Sexual activity: Yes    Birth control/protection: None  Lifestyle  . Physical activity:    Days per week: Not on file    Minutes per session: Not on file  . Stress: Not on file  Relationships  . Social connections:    Talks on phone: Not on file    Gets together: Not on file    Attends religious service: Not on file    Active member of club or organization: Not on file    Attends meetings of clubs or organizations: Not on file    Relationship status: Not on file  Other Topics Concern  . Not on file  Social History Narrative  . Not on file   Additional Social History:  Pain Medications: see mar Prescriptions: see mar Over the Counter: see mar History of alcohol / drug use?: Yes Negative Consequences of Use: Legal, Personal relationships Withdrawal Symptoms: Fever / Chills, Irritability, Nausea / Vomiting  Sleep: Poor  Appetite:  Fair  Current Medications: Current Facility-Administered Medications  Medication Dose Route Frequency Provider Last Rate Last Dose  . acetaminophen (TYLENOL) tablet 650 mg  650 mg Oral Q6H PRN Rankin, Shuvon B, NP   650 mg at 03/28/18 1212  . cloNIDine (CATAPRES) tablet 0.1 mg  0.1 mg Oral QID Lindell Spar I, NP   0.1 mg at 03/28/18 1656   Followed by  . [START ON 03/30/2018] cloNIDine (CATAPRES) tablet 0.1 mg  0.1 mg Oral BH-qamhs ,  I, NP       Followed by  . [START ON 04/01/2018] cloNIDine (CATAPRES) tablet 0.1 mg  0.1 mg Oral QAC breakfast ,  I, NP      . dicyclomine (BENTYL) tablet 20 mg  20 mg Oral Q6H PRN Lindell Spar I, NP      . DULoxetine (CYMBALTA) DR capsule 30 mg  30 mg Oral Daily Pennelope Bracken, MD   30 mg at 03/29/18 0757  . folic acid (FOLVITE) tablet 1 mg  1 mg Oral Daily Rankin, Shuvon B, NP   1 mg at 03/29/18 0757  . gabapentin (NEURONTIN) capsule 300 mg  300 mg Oral BID Pennelope Bracken, MD      .  hydrocortisone (ANUSOL-HC) 2.5 % rectal cream   Rectal TID Rankin, Shuvon B, NP      . hydrOXYzine (ATARAX/VISTARIL) tablet 50 mg  50 mg Oral Q6H PRN Pennelope Bracken, MD   50 mg at 03/28/18 2134  . loperamide (IMODIUM) capsule 2-4 mg  2-4 mg Oral PRN Pennelope Bracken, MD      . LORazepam (ATIVAN) tablet 1 mg  1 mg Oral Q6H PRN Pennelope Bracken, MD      . LORazepam (ATIVAN) tablet 1 mg  1 mg Oral TID Pennelope Bracken, MD   1 mg at 03/29/18 1142   Followed by  . [START ON 03/30/2018] LORazepam (ATIVAN) tablet 1 mg  1 mg Oral BID Pennelope Bracken, MD       Followed by  . [START ON 03/31/2018] LORazepam (ATIVAN) tablet 1 mg  1 mg Oral Daily [place], Randa Ngo, MD      . methocarbamol (ROBAXIN) tablet 500 mg  500 mg Oral Q8H PRN Lindell Spar I, NP      . multivitamin with minerals tablet 1 tablet  1 tablet Oral Daily Rankin, Shuvon B, NP   1 tablet at 03/29/18 0757  . naproxen (NAPROSYN) tablet 500 mg  500 mg Oral BID PRN Lindell Spar I, NP      . nicotine polacrilex (NICORETTE) gum 2 mg  2 mg Oral PRN [place], Myer Peer, MD      . ondansetron (ZOFRAN-ODT) disintegrating tablet 4 mg  4 mg Oral Q6H PRN Pennelope Bracken, MD      . polyethylene glycol (MIRALAX / GLYCOLAX) packet 17 g  17 g Oral  Daily Rankin, Shuvon B, NP   17 g at 03/27/18 0810  . risperiDONE (RISPERDAL) tablet 1 mg  1 mg Oral QHS Pennelope Bracken, MD   1 mg at 03/28/18 2135  . sulfamethoxazole-trimethoprim (BACTRIM DS,SEPTRA DS) 800-160 MG per tablet 1 tablet  1 tablet Oral Q12H Dara Hoyer, PA-C   1 tablet at 03/29/18 0757  . thiamine (VITAMIN B-1) tablet 100 mg  100 mg Oral Daily Rankin, Shuvon B, NP   100 mg at 03/29/18 0757  . traZODone (DESYREL) tablet 150 mg  150 mg Oral QHS Lindell Spar I, NP   150 mg at 03/28/18 2135   Lab Results:  Results for orders placed or performed during the hospital encounter of 03/26/18 (from the past 48 hour(s))  CBC     Status: Abnormal   Collection Time: 03/28/18  6:32 AM  Result Value Ref Range   WBC 14.6 (H) 4.0 - 10.5 K/uL   RBC 4.59 3.87 - 5.11 MIL/uL   Hemoglobin 13.5 12.0 - 15.0 g/dL   HCT 43.5 36.0 - 46.0 %   MCV 94.8 80.0 - 100.0 fL   MCH 29.4 26.0 - 34.0 pg   MCHC 31.0 30.0 - 36.0 g/dL   RDW 14.6 11.5 - 15.5 %   Platelets 692 (H) 150 - 400 K/uL   nRBC 0.0 0.0 - 0.2 %    Comment: Performed at [facility], [place] [address] [place], [place] [address]  CBC     Status: Abnormal   Collection Time: 03/29/18  6:45 AM  Result Value Ref Range   WBC 10.1 4.0 - 10.5 K/uL   RBC 4.31 3.87 - 5.11 MIL/uL   Hemoglobin 12.7 12.0 - 15.0 g/dL   HCT 40.5 36.0 - 46.0 %   MCV 94.0 80.0 - 100.0 fL   MCH  29.5 26.0 - 34.0 pg   MCHC 31.4 30.0 - 36.0 g/dL   RDW 14.6 11.5 - 15.5 %   Platelets 591 (H) 150 - 400 K/uL   nRBC 0.0 0.0 - 0.2 %    Comment: Performed at [facility], [place] [address] [place], [place] [address]   Blood Alcohol level:  Lab Results  Component Value Date   ETH <10 57/32/2025    Metabolic Disorder Labs: Lab Results  Component Value Date   HGBA1C 5.6 03/24/2018   MPG 114.02 03/24/2018   No results found for: PROLACTIN No results found for: CHOL, TRIG, HDL, CHOLHDL, VLDL, LDLCALC  Physical  Findings: AIMS: Facial and Oral Movements Muscles of Facial Expression: None, normal Lips and Perioral Area: None, normal Jaw: None, normal Tongue: None, normal,Extremity Movements Upper (arms, wrists, hands, fingers): None, normal Lower (legs, knees, ankles, toes): None, normal, Trunk Movements Neck, shoulders, hips: None, normal, Overall Severity Severity of abnormal movements (highest score from questions above): None, normal Incapacitation due to abnormal movements: None, normal Patient's awareness of abnormal movements (rate only patient's report): No Awareness, Dental Status Current problems with teeth and/or dentures?: No Does patient usually wear dentures?: No  CIWA:  CIWA-Ar Total: 1 COWS:  COWS Total Score: 4  Musculoskeletal: Strength & Muscle Tone: within normal limits Gait & Station: normal Patient leans: N/A  Psychiatric Specialty Exam: Physical Exam  Nursing note and vitals reviewed.   Review of Systems  Psychiatric/Behavioral: Positive for depression, hallucinations, substance abuse and suicidal ideas. Negative for memory loss. The patient is nervous/anxious and has insomnia.     Blood pressure 136/86, pulse 98, temperature 97.8 F (36.6 C), temperature source Oral, resp. rate 20, height 5\' 4"  (1.626 m), weight 135.6 kg, last menstrual period 03/23/2018, SpO2 98 %.Body mass index is 51.32 kg/m.  General Appearance: Casual and Fairly Groomed  Eye Contact:  Fair  Speech:  Clear and Coherent and Normal Rate  Volume:  Normal  Mood:  Anxious, Depressed and Irritable  Affect:  Blunt, Depressed and Labile  Thought Process:  Coherent and Goal Directed  Orientation:  Full (Time, Place, and Person)  Thought Content:  Hallucinations: Auditory Visual  Suicidal Thoughts:  Yes.  with intent/plan  Homicidal Thoughts:  No  Memory:  Immediate;   Fair Recent;   Fair Remote;   Fair  Judgement:  Poor  Insight:  Lacking  Psychomotor Activity:  Normal  Concentration:   Concentration: Fair  Recall:  [employer] of Knowledge:  Fair  Language:  Fair  Akathisia:  No  Handed:    AIMS (if indicated):     Assets:  Resilience Social Support  ADL's:  Intact  Cognition:  WNL  Sleep:  Number of Hours: 5.5     Treatment Plan Summary: Daily contact with patient to assess and evaluate symptoms and progress in treatment and Medication management.  - Continue inpatient hospitalization.  - Will continue today 03/29/2018 plan as below except where it is noted.  Alcohol detox. - Continue CIWA with ativan.   Opioid detox. - Discontinued Clonidine detox protocols, patient declines to take.  Depression - Continue Cymbalta 30mg  po qDay.   - Agitation Continue gabapentin 200mg  po BID.   Mood control. - Continue risperdal 1mg  po qhs.   Insomnia. - Continue Trazodone 150 mg po Q hs routinely.  - Continue all other current PRN's.  - Continue to ncourage participation in groups and therapeutic milieu.  - discharge disposition plan is ongoing.  Lindell Spar, NP, PMHNP, FNP-BC. 03/29/2018, 1:55 PMPatient ID: Sarah Cervantes, female   DOB: [birth year], 37 y.o.   MRN: [id number]

## 2018-03-29 NOTE — Progress Notes (Signed)
Recreation Therapy Notes  Date: 11.1.19 Time: 0930 Location: 300 Hall Dayroom  Group Topic: Stress Management  Goal Area(s) Addresses:  Patient will verbalize importance of using healthy stress management.  Patient will identify positive emotions associated with healthy stress management.   Intervention: Stress Management  Activity :  Guided Imagery.  LRT introduced the stress management technique of guided imagery.  LRT read a script allowing patients to visualize being in a peaceful meadow.  Patients were to listen as script was read to engage in the meditation.  Education:  Stress Management, Discharge Planning.   Education Outcome: Acknowledges edcuation/In group clarification offered/Needs additional education  Clinical Observations/Feedback: Pt did not attend group.      Victorino Sparrow, LRT/CTRS      Victorino Sparrow A 03/29/2018 11:35 AM

## 2018-03-29 NOTE — Progress Notes (Signed)
D: Patient in day room upon approach playing cards and continued till bed time. Patient is alert, pleasant, and cooperative. Patient presents with sad facial expression and anxious affect. Patient endorses passive SI. Patient denies HI, AVH, and verbally contracts for safety. Patient reports poor sleep and fair appetite. Patient has mild tremor in hands. Patient denies physical pain. Patient requested PRN anxiety medication.   A: Patient refused scheduled clonidine. All other scheduled medications administered per MD order. PRN anxiety medication administered per MD order. Support provided. Patient educated on safety on the unit and medications. Routine safety checks every 15 minutes. Patient stated understanding to tell nurse about any new physical symptoms. Patient understands to tell staff of any needs.     R: No adverse drug reactions noted. Patient verbally contracts for safety. Patient remains safe at this time and will continue to monitor.

## 2018-03-29 NOTE — Plan of Care (Signed)
  Problem: Education: Goal: Knowledge of [place] [facility] Education information/materials will improve Outcome: Progressing   Problem: Safety: Goal: Periods of time without injury will increase Outcome: Progressing   Patient oriented to the unit. Patient remains safe and will continue to monitor.  

## 2018-03-29 NOTE — Progress Notes (Signed)
Patient ID: Sarah Cervantes, female   DOB: Apr 24, 1981, 37 y.o.   MRN: [id number] D: "I'm doing well, just can't stop eating". Patient reports she had a good day and tolerating medication. Pt states she has been refusing her clonidine because it makes her feel "spacy". Pt mood and affect appears anxious. Pt denies SI/HI/AVH and pain. Pt attended and participated in evening AA group. Cooperative with assessment.   A: Medications administered as prescribed. Support and encouragement provided as needed. Pt encouraged to discuss feelings and come to staff with any question or concerns.   R: Patient remains safe and complaint with medications.

## 2018-03-30 DIAGNOSIS — G4700 Insomnia, unspecified: Secondary | ICD-10-CM

## 2018-03-30 MED ORDER — PRAZOSIN HCL 1 MG PO CAPS
1.0000 mg | ORAL_CAPSULE | Freq: Every day | ORAL | Status: DC
  Administered 2018-03-30 – 2018-04-03 (×5): 1 mg via ORAL
  Filled 2018-03-30: qty 14
  Filled 2018-03-30 (×5): qty 1

## 2018-03-30 MED ORDER — TOPIRAMATE 25 MG PO TABS
50.0000 mg | ORAL_TABLET | Freq: Every day | ORAL | Status: DC
  Administered 2018-03-30 – 2018-04-03 (×5): 50 mg via ORAL
  Filled 2018-03-30: qty 2
  Filled 2018-03-30: qty 28
  Filled 2018-03-30 (×4): qty 2
  Filled 2018-03-30: qty 28

## 2018-03-30 MED ORDER — GABAPENTIN 300 MG PO CAPS
300.0000 mg | ORAL_CAPSULE | Freq: Three times a day (TID) | ORAL | Status: DC
  Administered 2018-03-30 – 2018-04-03 (×14): 300 mg via ORAL
  Filled 2018-03-30 (×5): qty 1
  Filled 2018-03-30: qty 42
  Filled 2018-03-30: qty 1
  Filled 2018-03-30: qty 42
  Filled 2018-03-30 (×7): qty 1
  Filled 2018-03-30: qty 42
  Filled 2018-03-30 (×3): qty 1

## 2018-03-30 MED ORDER — ARIPIPRAZOLE 10 MG PO TABS
10.0000 mg | ORAL_TABLET | Freq: Every day | ORAL | Status: DC
  Administered 2018-03-30 – 2018-04-02 (×4): 10 mg via ORAL
  Filled 2018-03-30 (×6): qty 1

## 2018-03-30 NOTE — Progress Notes (Addendum)
[facility] MD Progress Note  03/30/2018 11:10 AM Sarah Cervantes  MRN:  [id number]   Subjective: Patient reports today that she did not get any sleep last night.  She feels that the medication did not do anything for her even though the increase the dose.  She also reports that she does not want to be on the Risperdal anymore and wants to be changed to a different medication.  She states that she does not need a medication that is not going to cause me to be hungry or to gain weight, I am large enough.  Patient also reports that she does not feel that she needs to medications for detox, due to the fact that she has been in the hospital from last Saturday until Tuesday or Wednesday and she feels that she has passed any withdrawal symptoms.  She reports that she does have episodes of agitation sometimes and is hoping that the medications can help calm things down.  Patient states that she is looking for residential treatment and then is hoping to go to a halfway house in [facility] or to be admitted at [facility].  Objective: Patient's chart and findings reviewed and discussed with treatment team.  Patient presents in the day room and is she has seen interacting with peers and staff appropriately.  Patient is calm, cooperative, and pleasant.  There have been minor complaints about her attitude towards the staff when she does not get up to get her medications on time.  However patient is attending groups and has been interacting thus far.  There have been no major complaints about this patient.  Principal Problem: Polysubstance dependence including opioid drug with daily use ([place]) Diagnosis:   Patient Active Problem List   Diagnosis Date Noted  . Bipolar I disorder, current or most recent episode depressed, with psychotic features ([place]) [F31.5] 03/26/2018  . Bacteremia due to Gram-negative bacteria [R78.81] 03/25/2018  . Cocaine abuse with cocaine-induced mood disorder ([place]) [F14.14] 03/24/2018  . PTSD  (post-traumatic stress disorder) [F43.10] 03/24/2018  . Bipolar II disorder ([place]) [F31.81] 03/24/2018  . Sepsis, Gram negative ([place]) [A41.50] 03/23/2018  . Alcohol abuse with intoxication ([place]) [F10.129] 03/23/2018  . Polysubstance dependence including opioid drug with daily use ([place]) [F11.20, F19.20] 03/23/2018   Total Time spent with patient: 30 minutes  Past Psychiatric History: See H&P  Past Medical History:  Past Medical History:  Diagnosis Date  . Cocaine abuse, daily use ([place]) 03/23/2018  . ETOH abuse 03/23/2018  . Heroin abuse ([place]) 03/23/2018   History reviewed. No pertinent surgical history. Family History: History reviewed. No pertinent family history. Family Psychiatric  History: See H&P Social History:  Social History   Substance and Sexual Activity  Alcohol Use Yes   Comment: Occ social drinker     Social History   Substance and Sexual Activity  Drug Use Yes   Comment: Heroin and Cocain on 2018/03/23. Pt uses every night    Social History   Socioeconomic History  . Marital status: Single    Spouse name: Not on file  . Number of children: Not on file  . Years of education: Not on file  . Highest education level: Not on file  Occupational History  . Not on file  Social Needs  . Financial resource strain: Not on file  . Food insecurity:    Worry: Not on file    Inability: Not on file  . Transportation needs:    Medical: Not on file  Non-medical: Not on file  Tobacco Use  . Smoking status: Current Every Day Smoker    Packs/day: 1.00    Years: 30.00    Pack years: 30.00  . Smokeless tobacco: Never Used  Substance and Sexual Activity  . Alcohol use: Yes    Comment: Occ social drinker  . Drug use: Yes    Comment: Heroin and Cocain on 2018/03/23. Pt uses every night  . Sexual activity: Yes    Birth control/protection: None  Lifestyle  . Physical activity:    Days per week: Not on file    Minutes per session: Not on file  . Stress: Not on file   Relationships  . Social connections:    Talks on phone: Not on file    Gets together: Not on file    Attends religious service: Not on file    Active member of club or organization: Not on file    Attends meetings of clubs or organizations: Not on file    Relationship status: Not on file  Other Topics Concern  . Not on file  Social History Narrative  . Not on file   Additional Social History:    Pain Medications: see mar Prescriptions: see mar Over the Counter: see mar History of alcohol / drug use?: Yes Negative Consequences of Use: Legal, Personal relationships Withdrawal Symptoms: Fever / Chills, Irritability, Nausea / Vomiting                    Sleep: Fair  Appetite:  Good  Current Medications: Current Facility-Administered Medications  Medication Dose Route Frequency Provider Last Rate Last Dose  . acetaminophen (TYLENOL) tablet 650 mg  650 mg Oral Q6H PRN Rankin, Shuvon B, NP   650 mg at 03/28/18 1212  . ARIPiprazole (ABILIFY) tablet 10 mg  10 mg Oral Daily Money, Lowry Ram, FNP   10 mg at 03/30/18 1037  . dicyclomine (BENTYL) tablet 20 mg  20 mg Oral Q6H PRN Lindell Spar I, NP      . DULoxetine (CYMBALTA) DR capsule 30 mg  30 mg Oral Daily Pennelope Bracken, MD   30 mg at 03/30/18 0857  . folic acid (FOLVITE) tablet 1 mg  1 mg Oral Daily Rankin, Shuvon B, NP   1 mg at 03/30/18 0856  . gabapentin (NEURONTIN) capsule 300 mg  300 mg Oral TID Money, Lowry Ram, FNP      . hydrocortisone (ANUSOL-HC) 2.5 % rectal cream   Rectal TID Rankin, Shuvon B, NP      . hydrOXYzine (ATARAX/VISTARIL) tablet 50 mg  50 mg Oral Q6H PRN Pennelope Bracken, MD   50 mg at 03/28/18 2134  . loperamide (IMODIUM) capsule 2-4 mg  2-4 mg Oral PRN Pennelope Bracken, MD      . LORazepam (ATIVAN) tablet 1 mg  1 mg Oral Q6H PRN Pennelope Bracken, MD      . methocarbamol (ROBAXIN) tablet 500 mg  500 mg Oral Q8H PRN Lindell Spar I, NP      . multivitamin with minerals  tablet 1 tablet  1 tablet Oral Daily Rankin, Shuvon B, NP   1 tablet at 03/30/18 0856  . naproxen (NAPROSYN) tablet 500 mg  500 mg Oral BID PRN Lindell Spar I, NP   500 mg at 03/29/18 1718  . nicotine polacrilex (NICORETTE) gum 2 mg  2 mg Oral PRN Carver Murakami, Myer Peer, MD   2 mg at 03/30/18 1038  . ondansetron (ZOFRAN-ODT) disintegrating  tablet 4 mg  4 mg Oral Q6H PRN Pennelope Bracken, MD   4 mg at 03/30/18 0855  . polyethylene glycol (MIRALAX / GLYCOLAX) packet 17 g  17 g Oral Daily Rankin, Shuvon B, NP   17 g at 03/30/18 0856  . prazosin (MINIPRESS) capsule 1 mg  1 mg Oral QHS Money, Travis B, FNP      . sulfamethoxazole-trimethoprim (BACTRIM DS,SEPTRA DS) 800-160 MG per tablet 1 tablet  1 tablet Oral Q12H Dara Hoyer, PA-C   1 tablet at 03/30/18 0857  . thiamine (VITAMIN B-1) tablet 100 mg  100 mg Oral Daily Rankin, Shuvon B, NP   100 mg at 03/30/18 0856  . topiramate (TOPAMAX) tablet 50 mg  50 mg Oral QHS Money, Lowry Ram, FNP      . traZODone (DESYREL) tablet 150 mg  150 mg Oral QHS Lindell Spar I, NP   150 mg at 03/29/18 2149    Lab Results:  Results for orders placed or performed during the hospital encounter of 03/26/18 (from the past 48 hour(s))  CBC     Status: Abnormal   Collection Time: 03/29/18  6:45 AM  Result Value Ref Range   WBC 10.1 4.0 - 10.5 K/uL   RBC 4.31 3.87 - 5.11 MIL/uL   Hemoglobin 12.7 12.0 - 15.0 g/dL   HCT 40.5 36.0 - 46.0 %   MCV 94.0 80.0 - 100.0 fL   MCH 29.5 26.0 - 34.0 pg   MCHC 31.4 30.0 - 36.0 g/dL   RDW 14.6 11.5 - 15.5 %   Platelets 591 (H) 150 - 400 K/uL   nRBC 0.0 0.0 - 0.2 %    Comment: Performed at [facility], [place] [address], [place], [place] [address]    Blood Alcohol level:  Lab Results  Component Value Date   ETH <10 95/18/8416    Metabolic Disorder Labs: Lab Results  Component Value Date   HGBA1C 5.6 03/24/2018   MPG 114.02 03/24/2018   No results found for: PROLACTIN No results found for: CHOL,  TRIG, HDL, CHOLHDL, VLDL, LDLCALC  Physical Findings: AIMS: Facial and Oral Movements Muscles of Facial Expression: None, normal Lips and Perioral Area: None, normal Jaw: None, normal Tongue: None, normal,Extremity Movements Upper (arms, wrists, hands, fingers): None, normal Lower (legs, knees, ankles, toes): None, normal, Trunk Movements Neck, shoulders, hips: None, normal, Overall Severity Severity of abnormal movements (highest score from questions above): None, normal Incapacitation due to abnormal movements: None, normal Patient's awareness of abnormal movements (rate only patient's report): No Awareness, Dental Status Current problems with teeth and/or dentures?: No Does patient usually wear dentures?: No  CIWA:  CIWA-Ar Total: 1 COWS:  COWS Total Score: 1  Musculoskeletal: Strength & Muscle Tone: within normal limits Gait & Station: normal Patient leans: N/A  Psychiatric Specialty Exam: Physical Exam  Nursing note and vitals reviewed. Constitutional: She is oriented to person, place, and time. She appears well-developed and well-nourished.  Cardiovascular: Normal rate.  Respiratory: Effort normal.  Musculoskeletal: Normal range of motion.  Neurological: She is alert and oriented to person, place, and time.  Skin: Skin is warm.    Review of Systems  Constitutional: Negative.   HENT: Negative.   Eyes: Negative.   Respiratory: Negative.   Cardiovascular: Negative.   Gastrointestinal: Negative.   Genitourinary: Negative.   Musculoskeletal: Negative.   Skin: Negative.   Neurological: Negative.   Endo/Heme/Allergies: Negative.   Psychiatric/Behavioral: Positive for depression and substance abuse. Negative for  suicidal ideas. The patient has insomnia.     Blood pressure 137/85, pulse 97, temperature 97.8 F (36.6 C), temperature source Oral, resp. rate (!) 24, height 5\' 4"  (1.626 m), weight 135.6 kg, last menstrual period 03/23/2018, SpO2 98 %.Body mass index is 51.32  kg/m.  General Appearance: Casual  Eye Contact:  Good  Speech:  Clear and Coherent and Normal Rate  Volume:  Normal  Mood:  Depressed  Affect:  Flat  Thought Process:  Goal Directed and Descriptions of Associations: Intact  Orientation:  Full (Time, Place, and Person)  Thought Content:  WDL  Suicidal Thoughts:  No  Homicidal Thoughts:  No  Memory:  Immediate;   Good Recent;   Good Remote;   Good  Judgement:  Fair  Insight:  Fair  Psychomotor Activity:  Normal  Concentration:  Concentration: Good and Attention Span: Good  Recall:  Good  Fund of Knowledge:  Good  Language:  Good  Akathisia:  No  Handed:  Right  AIMS (if indicated):     Assets:  Communication Skills Desire for Improvement Financial Resources/Insurance Housing Physical Health Social Support Transportation  ADL's:  Intact  Cognition:  WNL  Sleep:  Number of Hours: 6   Problems addressed Polysubstance dependence PTSD Bipolar 1 disorder  Treatment Plan Summary: Daily contact with patient to assess and evaluate symptoms and progress in treatment, Medication management and Plan is to: Discontinue Risperdal Start Abilify 10 mg p.o. daily for mood stability Increase gabapentin to 300 mg p.o. 3 times daily for mood stability and pain Discontinue clonidine detox Decrease Ativan to as needed only for CIWA greater than 10 Start Topamax 50 mg p.o. nightly to assist with weight, mood, and insomnia Continue trazodone 150 mg p.o. nightly for insomnia mood stability Continue Cymbalta 30 mg p.o. daily for mood stability Encourage group therapy participation  Lewis Shock, FNP 03/30/2018, 11:10 AM   ..Agree with NP Progress Note

## 2018-03-30 NOTE — [facility] Group Notes (Addendum)
LCSW Group Therapy Note  03/30/2018   10:30-11:30am   Type of Therapy and Topic:  Group Therapy: Anger, a Secondary Emotion  Participation Level:  Active  Description of Group:   In this group, patients learned how to recognize the underlying emotions when they become angry as well as the frequent behavioral responses they have to anger-provoking situations.  They identified a recent time they became angry and how they reacted.  They analyzed how their reaction was possibly beneficial and how it was possibly unhelpful.  The group discussed a variety of healthier coping skills that could help with such a situation in the future, including taking time to think about the underlying primary emotion and figuring out ways to deal with that before it escalates to anger.  Therapeutic Goals: 1. Patients will remember their last incident of anger and rate it on a scale of 1-10, identifying the severity for them personally. 2. Patients will identify how their behavior at that time worked for them, as well as how it worked against them. 3. Patients will explore possible new behaviors to use in future anger situations. 4. Patients will learn that anger itself is normal and cannot be eliminated, and that healthier reactions can assist with resolving conflict rather than worsening situations.  Summary of Patient Progress:  The patient initially stated she does not get agree and "don't care about nothing."  Later, she wanted to clarify that she is "two different people when I'm on my medicine and when I'm not on my medicine."  She said that when she is on her medicine, she just does not care and people cannot make her angry.  When she is off her medicine, she immediately becomes angry at many situations.  She was able to acknowledge that it might be helpful to find a helpful place where she can acknowledge her angry feelings and especially the underlying emotions to deal with them rather than simply ignore them or  explode.  Therapeutic Modalities:   Cognitive Behavioral Therapy Discussion  Sarah Cervantes

## 2018-03-30 NOTE — Progress Notes (Signed)
D.  Pt pleasant on approach, complaint of poor sleep last night.  Pt did get up after group and came down for snacks.  Pt observed engaged in appropriate interaction with peers on the unit.  Pt currently denies SI/HI/AVH at this time.  A.  Support and encouragement offered, medication given as ordered  R.  Pt remains safe on the unit, will continue to monitor.

## 2018-03-30 NOTE — Progress Notes (Signed)
D: Patient presents calm and cooperative and bright. She slept poorly last night and received medication that was helpful until 3am. Her appetite is good, energy low and concentration poor. She rates her depression, sense of hopelessness and anxiety 8/10. She has numerous somatic complaints: tremors, diarrhea, cravings, agitation, runny nose, chilling, cramping, nausea, irritability, lightheadedness, chest pain 5/10 (this was previously medically cleared). Patient denies SI/HI/AVH.  A: Patient checked q15 min, and checks reviewed. Reviewed medication changes with patient and educated on side effects. Educated patient on importance of attending group therapy sessions and educated on several coping skills. Encouarged participation in milieu through recreation therapy and attending meals with peers. Support and encouragement provided. Fluids offered. R: Patient receptive to education on medications, and is medication compliant. Patient contracts for safety on the unit. "I'm tired, cannot sleep."

## 2018-03-31 LAB — PREGNANCY, URINE: Preg Test, Ur: NEGATIVE

## 2018-03-31 MED ORDER — DIPHENHYDRAMINE HCL 25 MG PO CAPS
50.0000 mg | ORAL_CAPSULE | Freq: Every evening | ORAL | Status: DC | PRN
  Administered 2018-03-31 – 2018-04-03 (×4): 50 mg via ORAL
  Filled 2018-03-31 (×4): qty 2

## 2018-03-31 MED ORDER — ONDANSETRON 4 MG PO TBDP
4.0000 mg | ORAL_TABLET | Freq: Four times a day (QID) | ORAL | Status: DC | PRN
  Administered 2018-03-31 – 2018-04-01 (×3): 4 mg via ORAL
  Filled 2018-03-31 (×3): qty 1

## 2018-03-31 MED ORDER — AMITRIPTYLINE HCL 25 MG PO TABS
25.0000 mg | ORAL_TABLET | Freq: Every day | ORAL | Status: DC
  Administered 2018-03-31 – 2018-04-01 (×2): 25 mg via ORAL
  Filled 2018-03-31 (×3): qty 1

## 2018-03-31 NOTE — Progress Notes (Signed)
Patient did attend the evening speaker AA meeting.  

## 2018-03-31 NOTE — [facility] Group Notes (Signed)
[facility] Group Notes: (Clinical Social Work)   03/31/2018      Type of Therapy:  Group Therapy   Participation Level:  Did Not Attend despite MHT prompting over the intercom and CSW knocking on all doors and saying "it's time for group".    Rise Mu, LCSW

## 2018-03-31 NOTE — B[facility] Group Notes (Signed)
[facility] Group Notes:  (Nursing/MHT/Case Management/Adjunct)  Date:  03/31/2018  Time:  3:51 PM  Type of Therapy:  Nurse Education  Participation Level:  Did Not Attend  Summary of Progress/Problems: In this group, the nurse discussed positive self-image, positive self-talk. The nurse also covered how to change automatic negative thoughts and improving self-narrative.  Lesli Albee 03/31/2018, 3:51 PM

## 2018-03-31 NOTE — Plan of Care (Signed)
  Problem: Activity: Goal: Sleeping patterns will improve Outcome: Progressing  Patient encouraged to be up in the milieu during the day to prevent day sleeping. Patient still with insomnia at nights. Patient with new sleep medication orders.    Problem: Safety: Goal: Periods of time without injury will increase Outcome: Progressing  Patient is on q15 minute safety checks and low fall risk precautions.

## 2018-03-31 NOTE — Progress Notes (Addendum)
[facility] MD Progress Note  03/31/2018 10:32 AM Sarah Cervantes  MRN:  [id number]   Subjective: Patient states today that she still did not sleep very well last night.  She reports that she does now for approximately 1 hour and then got back up and took another medication and then went and lay back down and finally slept for approximately another hour or so and then she was up from approximately 3 AM until this morning when I saw her.  She reports that this is the biggest thing she is having issues with and desperately needs some sleep.  She denies any suicidal or homicidal ideations today.  She also denies any hallucinations.  She also denies any anxiety or depression today.  She denies any medication side effects and reports that she has not had a good appetite.  She states that she is liking the other medications thus far would like to continue them except for the sleep medication.  Objective: Patient's chart and findings reviewed and discussed with treatment team.  Patient presents in her room lying in the bed, but is awake.  She is pleasant, calm, and cooperative.  After discussing medications and symptoms with Dr. Parke Poisson we have decided to discontinue the trazodone for 2 reasons one being concern for the patient's bipolar 1 diagnosis and being on Cymbalta as well.  Another concern is she is still not sleeping at 150 mg.  We will start her on a very low dose of Elavil 25 mg to see if this can assist her with sleep as patient had stated that Elavil helped her sleep in the past.  Principal Problem: Polysubstance dependence including opioid drug with daily use ([place]) Diagnosis:   Patient Active Problem List   Diagnosis Date Noted  . Bipolar I disorder, current or most recent episode depressed, with psychotic features ([place]) [F31.5] 03/26/2018  . Bacteremia due to Gram-negative bacteria [R78.81] 03/25/2018  . Cocaine abuse with cocaine-induced mood disorder ([place]) [F14.14] 03/24/2018  . PTSD (post-traumatic  stress disorder) [F43.10] 03/24/2018  . Bipolar II disorder ([place]) [F31.81] 03/24/2018  . Sepsis, Gram negative ([place]) [A41.50] 03/23/2018  . Alcohol abuse with intoxication ([place]) [F10.129] 03/23/2018  . Polysubstance dependence including opioid drug with daily use ([place]) [F11.20, F19.20] 03/23/2018   Total Time spent with patient: 30 minutes  Past Psychiatric History: See H&P  Past Medical History:  Past Medical History:  Diagnosis Date  . Cocaine abuse, daily use ([place]) 03/23/2018  . ETOH abuse 03/23/2018  . Heroin abuse ([place]) 03/23/2018   History reviewed. No pertinent surgical history. Family History: History reviewed. No pertinent family history. Family Psychiatric  History: See H&P Social History:  Social History   Substance and Sexual Activity  Alcohol Use Yes   Comment: Occ social drinker     Social History   Substance and Sexual Activity  Drug Use Yes   Comment: Heroin and Cocain on 2018/03/23. Pt uses every night    Social History   Socioeconomic History  . Marital status: Single    Spouse name: Not on file  . Number of children: Not on file  . Years of education: Not on file  . Highest education level: Not on file  Occupational History  . Not on file  Social Needs  . Financial resource strain: Not on file  . Food insecurity:    Worry: Not on file    Inability: Not on file  . Transportation needs:    Medical: Not on file    Non-medical:  Not on file  Tobacco Use  . Smoking status: Current Every Day Smoker    Packs/day: 1.00    Years: 30.00    Pack years: 30.00  . Smokeless tobacco: Never Used  Substance and Sexual Activity  . Alcohol use: Yes    Comment: Occ social drinker  . Drug use: Yes    Comment: Heroin and Cocain on 2018/03/23. Pt uses every night  . Sexual activity: Yes    Birth control/protection: None  Lifestyle  . Physical activity:    Days per week: Not on file    Minutes per session: Not on file  . Stress: Not on file  Relationships   . Social connections:    Talks on phone: Not on file    Gets together: Not on file    Attends religious service: Not on file    Active member of club or organization: Not on file    Attends meetings of clubs or organizations: Not on file    Relationship status: Not on file  Other Topics Concern  . Not on file  Social History Narrative  . Not on file   Additional Social History:    Pain Medications: see mar Prescriptions: see mar Over the Counter: see mar History of alcohol / drug use?: Yes Negative Consequences of Use: Legal, Personal relationships Withdrawal Symptoms: Fever / Chills, Irritability, Nausea / Vomiting                    Sleep: Fair  Appetite:  Good  Current Medications: Current Facility-Administered Medications  Medication Dose Route Frequency Provider Last Rate Last Dose  . acetaminophen (TYLENOL) tablet 650 mg  650 mg Oral Q6H PRN Rankin, Shuvon B, NP   650 mg at 03/28/18 1212  . amitriptyline (ELAVIL) tablet 25 mg  25 mg Oral QHS Money, Lowry Ram, FNP      . ARIPiprazole (ABILIFY) tablet 10 mg  10 mg Oral Daily Money, Lowry Ram, FNP   10 mg at 03/31/18 0825  . dicyclomine (BENTYL) tablet 20 mg  20 mg Oral Q6H PRN Nwoko, Agnes I, NP      . diphenhydrAMINE (BENADRYL) capsule 50 mg  50 mg Oral QHS PRN Money, Lowry Ram, FNP      . DULoxetine (CYMBALTA) DR capsule 30 mg  30 mg Oral Daily Pennelope Bracken, MD   30 mg at 03/31/18 0825  . folic acid (FOLVITE) tablet 1 mg  1 mg Oral Daily Rankin, Shuvon B, NP   1 mg at 03/31/18 0825  . gabapentin (NEURONTIN) capsule 300 mg  300 mg Oral TID Money, Lowry Ram, FNP   300 mg at 03/31/18 0825  . hydrocortisone (ANUSOL-HC) 2.5 % rectal cream   Rectal TID Rankin, Shuvon B, NP      . hydrOXYzine (ATARAX/VISTARIL) tablet 50 mg  50 mg Oral Q6H PRN Pennelope Bracken, MD   50 mg at 03/31/18 0827  . methocarbamol (ROBAXIN) tablet 500 mg  500 mg Oral Q8H PRN Lindell Spar I, NP      . multivitamin with minerals  tablet 1 tablet  1 tablet Oral Daily Rankin, Shuvon B, NP   1 tablet at 03/31/18 0825  . naproxen (NAPROSYN) tablet 500 mg  500 mg Oral BID PRN Lindell Spar I, NP   500 mg at 03/29/18 1718  . nicotine polacrilex (NICORETTE) gum 2 mg  2 mg Oral PRN Shirlean Berman, Myer Peer, MD   2 mg at 03/30/18 1038  . ondansetron (  ZOFRAN-ODT) disintegrating tablet 4 mg  4 mg Oral Q6H PRN Lindon Romp A, NP      . polyethylene glycol (MIRALAX / GLYCOLAX) packet 17 g  17 g Oral Daily Rankin, Shuvon B, NP   17 g at 03/30/18 0856  . prazosin (MINIPRESS) capsule 1 mg  1 mg Oral QHS Money, Lowry Ram, FNP   1 mg at 03/30/18 2126  . sulfamethoxazole-trimethoprim (BACTRIM DS,SEPTRA DS) 800-160 MG per tablet 1 tablet  1 tablet Oral Q12H Dara Hoyer, PA-C   1 tablet at 03/31/18 0825  . thiamine (VITAMIN B-1) tablet 100 mg  100 mg Oral Daily Rankin, Shuvon B, NP   100 mg at 03/31/18 0825  . topiramate (TOPAMAX) tablet 50 mg  50 mg Oral QHS Money, Lowry Ram, FNP   50 mg at 03/30/18 2126    Lab Results:  Results for orders placed or performed during the hospital encounter of 03/26/18 (from the past 48 hour(s))  Pregnancy, urine     Status: None   Collection Time: 03/30/18  8:34 PM  Result Value Ref Range   Preg Test, Ur NEGATIVE NEGATIVE    Comment:        THE SENSITIVITY OF THIS METHODOLOGY IS >20 mIU/mL. Performed at [facility], [place] [address] [place], [place] [address]     Blood Alcohol level:  Lab Results  Component Value Date   ETH <10 60/45/4098    Metabolic Disorder Labs: Lab Results  Component Value Date   HGBA1C 5.6 03/24/2018   MPG 114.02 03/24/2018   No results found for: PROLACTIN No results found for: CHOL, TRIG, HDL, CHOLHDL, VLDL, LDLCALC  Physical Findings: AIMS: Facial and Oral Movements Muscles of Facial Expression: None, normal Lips and Perioral Area: None, normal Jaw: None, normal Tongue: None, normal,Extremity Movements Upper (arms, wrists, hands, fingers): None,  normal Lower (legs, knees, ankles, toes): None, normal, Trunk Movements Neck, shoulders, hips: None, normal, Overall Severity Severity of abnormal movements (highest score from questions above): None, normal Incapacitation due to abnormal movements: None, normal Patient's awareness of abnormal movements (rate only patient's report): No Awareness, Dental Status Current problems with teeth and/or dentures?: No Does patient usually wear dentures?: No  CIWA:  CIWA-Ar Total: 5 COWS:  COWS Total Score: 2  Musculoskeletal: Strength & Muscle Tone: within normal limits Gait & Station: normal Patient leans: N/A  Psychiatric Specialty Exam: Physical Exam  Nursing note and vitals reviewed. Constitutional: She is oriented to person, place, and time. She appears well-developed and well-nourished.  Cardiovascular: Normal rate.  Respiratory: Effort normal.  Musculoskeletal: Normal range of motion.  Neurological: She is alert and oriented to person, place, and time.  Skin: Skin is warm.    Review of Systems  Constitutional: Negative.   HENT: Negative.   Eyes: Negative.   Respiratory: Negative.   Cardiovascular: Negative.   Gastrointestinal: Negative.   Genitourinary: Negative.   Musculoskeletal: Negative.   Skin: Negative.   Neurological: Negative.   Endo/Heme/Allergies: Negative.   Psychiatric/Behavioral: Positive for substance abuse. Negative for suicidal ideas. The patient has insomnia.     Blood pressure 134/86, pulse 94, temperature 98.5 F (36.9 C), temperature source Oral, resp. rate (!) 24, height 5\' 4"  (1.626 m), weight 135.6 kg, last menstrual period 03/23/2018, SpO2 98 %.Body mass index is 51.32 kg/m.  General Appearance: Casual  Eye Contact:  Good  Speech:  Clear and Coherent and Normal Rate  Volume:  Normal  Mood:  Euthymic  Affect:  Congruent  Thought Process:  Goal Directed and Descriptions of Associations: Intact  Orientation:  Full (Time, Place, and Person)  Thought  Content:  WDL  Suicidal Thoughts:  No  Homicidal Thoughts:  No  Memory:  Immediate;   Good Recent;   Good Remote;   Good  Judgement:  Fair  Insight:  Fair  Psychomotor Activity:  Normal  Concentration:  Concentration: Good and Attention Span: Good  Recall:  Good  Fund of Knowledge:  Good  Language:  Good  Akathisia:  No  Handed:  Right  AIMS (if indicated):     Assets:  Communication Skills Desire for Improvement Financial Resources/Insurance Housing Physical Health Social Support Transportation  ADL's:  Intact  Cognition:  WNL  Sleep:  Number of Hours: 6   Problems addressed Polysubstance dependence PTSD Bipolar 1 disorder  Treatment Plan Summary: Daily contact with patient to assess and evaluate symptoms and progress in treatment, Medication management and Plan is to: Continue Abilify 10 mg p.o. daily for mood stability Continue gabapentin to 300 mg p.o. 3 times daily for mood stability and pain Continue Ativan to as needed only for CIWA greater than 10 Continue Topamax 50 mg p.o. nightly to assist with weight, mood, and insomnia Discontinue trazodone  Start Elavil 25 mg PO QHS for mood stability and sleep Continue Cymbalta 30 mg p.o. daily for mood stability Encourage group therapy participation  Lewis Shock, FNP 03/31/2018, 10:32 AM   ..Agree with NP Progress Note

## 2018-03-31 NOTE — Progress Notes (Addendum)
Patient ID: Sarah Cervantes, female   DOB: 1981-03-19, 37 y.o.   MRN: [id number]  Nursing Progress Note [phone]  Data: On initial approach, patient was resting in bed reporting to writer she did not get any sleep last night but feels she "could dose off now". Patient was provided medications this morning by another RN. Patient requested PRN Vistaril for anxiety. Patient provided but declined to complete their self-inventory sheet. Patient currently denies SI/HI/AVH or withdrawal symptoms. Patient encouraged to be out of bed today and attend groups to help prepare her body for sleep tonight. However, patient is observed to be sleeping through most of the day and is not attending groups.   Action: Patient is educated about and provided medication per provider's orders. Patient safety maintained with q15 min safety checks and frequent rounding. Low fall risk precautions in place. Emotional support given. 1:1 interaction and active listening provided. Patient encouraged to attend meals, groups, and work on treatment plan and goals. Labs, vital signs and patient behavior monitored throughout shift.   Response: Patient remains safe on the unit at this time and agrees to come to staff with any issues/concerns. Will continue to support and monitor.

## 2018-04-01 MED ORDER — MICONAZOLE NITRATE 200 MG VA SUPP: 200.0000 mg | Freq: Every day | VAGINAL | Status: DC

## 2018-04-01 MED ORDER — CLOTRIMAZOLE 2 % VA CREA
1.0000 | TOPICAL_CREAM | Freq: Every day | VAGINAL | Status: DC
  Administered 2018-04-01 – 2018-04-02 (×2): 1 via VAGINAL
  Filled 2018-04-01: qty 21

## 2018-04-01 NOTE — Progress Notes (Addendum)
D: Patient is alert, oriented, pleasant, and cooperative. Patient presents with flat facial expression and blunted affect. Patient denies SI, HI, AVH, and verbally contracts for safety. Patient reports mild anxiety and difficulty staying asleep at night. Patient denies physical symptoms/pain. Patient requested PRN benadryl for sleep.  Patient woke at 0240 wanting to report she is still having trouble sleeping through the night. Patient was awake for about 30 minutes then was able to go back to sleep.    A: Scheduled medications administered per MD order. PRN benadryl administer for sleep per MD order. Support provided. Patient educated on safety on the unit and medications. Routine safety checks every 15 minutes. Patient stated understanding to tell nurse about any new physical symptoms. Patient understands to tell staff of any needs.     R: No adverse drug reactions noted. Patient verbally contracts for safety. Patient remains safe at this time and will continue to monitor.

## 2018-04-01 NOTE — Progress Notes (Addendum)
Patient ID: Sarah Cervantes, female   DOB: [birth year], 37 y.o.   MRN: [id number]  Nursing Progress Note [phone]  Data: Patient presents pleasant and cooperative reporting that she slept well last night. Patient compliant with scheduled medications. Patient reports a new onset of a red, itchy rash to her chest and upper abdomen this morning that has resolved this afternoon. Patient believes it to be from the soap she is using here. Patient reports she feels the onset of a yeast infection, MD notified, see new orders. Patient completed self-inventory sheet and rates depression, hopelessness, and anxiety 3,3,3 respectively. Patient rates their sleep and appetite as fair/good respectively. Patient states goal for today is to "work on life and self-talk". Patient currently denies SI/HI/AVH.   Action: Patient is educated about and provided medication per provider's orders. Patient safety maintained with q15 min safety checks and frequent rounding. Low fall risk precautions in place. Emotional support given. 1:1 interaction and active listening provided. Patient encouraged to attend meals, groups, and work on treatment plan and goals. Labs, vital signs and patient behavior monitored throughout shift.   Response: Patient remains safe on the unit at this time and agrees to come to staff with any issues/concerns. Patient is interacting with peers appropriately on the unit. Will continue to support and monitor.

## 2018-04-01 NOTE — Plan of Care (Signed)
  Problem: Education: Goal: Knowledge of [facility] Education information/materials will improve Outcome: Progressing   Problem: Activity: Goal: Interest or engagement in activities will improve Outcome: Progressing   Problem: Safety: Goal: Periods of time without injury will increase Outcome: Progressing   Patient oriented to the unit. Patient getting along well in the milieu. Patient remains safe and will continue to monitor.  

## 2018-04-01 NOTE — [facility] Group Notes (Signed)
Adult Psychoeducational Group Note  Date:  04/01/2018  Time: 4:00 PM  Group Topic/Focus: Mindfulness The purpose of this group is to educate about the physiologic effects of anxiety on the body and the benefits of mindfulness to decrease anxiety. Patients were provided techniques to practice grounding and mindfulness.  Participation Level:  Did Not Attend  Additional Comments:  Patient was invited but declined to attend group.  Estill Bamberg A [place] 04/01/2018 5:00 PM

## 2018-04-01 NOTE — Plan of Care (Signed)
  Problem: Activity: Goal: Sleeping patterns will improve Outcome: Progressing  Patient reports she slept well and feels rested. 5.5 hours of sleep recorded.   Problem: Safety: Goal: Periods of time without injury will increase Outcome: Progressing  Patient contracts for safety with staff.

## 2018-04-01 NOTE — Progress Notes (Signed)
Adult Psychoeducational Group Note  Date:  04/01/2018 Time:  9:44 PM  Group Topic/Focus:  Wrap-Up Group:   The focus of this group is to help patients review their daily goal of treatment and discuss progress on daily workbooks.  Participation Level:  Active  Participation Quality:  Appropriate  Affect:  Appropriate  Cognitive:  Appropriate  Insight: Appropriate  Engagement in Group:  Engaged  Modes of Intervention:  Discussion  Additional Comments:  Pt stated she was upset that she has not been discharged yet but did get to speak with the dr today regarding being discharged.  Pt rated the day at a 5/10.  Jeffrey Voth 04/01/2018, 9:44 PM

## 2018-04-01 NOTE — Plan of Care (Signed)
  Problem: Education: Goal: Knowledge of [facility] Education information/materials will improve Outcome: Progressing Goal: Emotional status will improve Outcome: Progressing   Problem: Safety: Goal: Periods of time without injury will increase Outcome: Progressing   Patient oriented to the unit. Patient denies SI and is getting along well in the milieu. Patient remains safe and will continue to monitor.

## 2018-04-01 NOTE — Progress Notes (Signed)
Recreation Therapy Notes  Date: 11.4.19 Time: 0930 Location: [address] Dayroom  Group Topic: Stress Management  Goal Area(s) Addresses:  Patient will verbalize importance of using healthy stress management.  Patient will identify positive emotions associated with healthy stress management.   Behavioral Response: Engaged  Intervention: Stress Management  Activity :  Meditation.  LRT introduced the stress management technique of meditation.  LRT played a meditation dealing with resilience.  Patients were to listen and follow along with the meditation.  Education:  Stress Management, Discharge Planning.   Education Outcome: Acknowledges edcuation/In group clarification offered/Needs additional education  Clinical Observations/Feedback: Pt attended and participated in group.     Victorino Sparrow, LRT/CTRS         Victorino Sparrow A 04/01/2018 10:55 AM

## 2018-04-01 NOTE — Tx Team (Signed)
Interdisciplinary Treatment and Diagnostic Plan Update  04/01/2018 Time of Session: [id number] Nazly Digilio MRN: [id number]  Principal Diagnosis: Bipolar 2 Disorder  Secondary Diagnoses: Principal Problem:   Polysubstance dependence including opioid drug with daily use ([place]) Active Problems:   PTSD (post-traumatic stress disorder)   Bipolar I disorder, current or most recent episode depressed, with psychotic features ([place])   Current Medications:  Current Facility-Administered Medications  Medication Dose Route Frequency Provider Last Rate Last Dose  . acetaminophen (TYLENOL) tablet 650 mg  650 mg Oral Q6H PRN Rankin, Shuvon B, NP   650 mg at 03/28/18 1212  . amitriptyline (ELAVIL) tablet 25 mg  25 mg Oral QHS Money, Lowry Ram, FNP   25 mg at 03/31/18 2211  . ARIPiprazole (ABILIFY) tablet 10 mg  10 mg Oral Daily Money, Lowry Ram, FNP   10 mg at 04/01/18 0755  . clotrimazole (GYNE-LOTRIMIN 3) 2 % vaginal cream 1 Applicatorful  1 Applicatorful Vaginal QHS Cobos, Fernando A, MD      . dicyclomine (BENTYL) tablet 20 mg  20 mg Oral Q6H PRN Nwoko, Agnes I, NP      . diphenhydrAMINE (BENADRYL) capsule 50 mg  50 mg Oral QHS PRN Money, Lowry Ram, FNP   50 mg at 03/31/18 2211  . DULoxetine (CYMBALTA) DR capsule 30 mg  30 mg Oral Daily Pennelope Bracken, MD   30 mg at 04/01/18 0755  . folic acid (FOLVITE) tablet 1 mg  1 mg Oral Daily Rankin, Shuvon B, NP   1 mg at 04/01/18 0755  . gabapentin (NEURONTIN) capsule 300 mg  300 mg Oral TID Money, Lowry Ram, FNP   300 mg at 04/01/18 1214  . hydrocortisone (ANUSOL-HC) 2.5 % rectal cream   Rectal TID Rankin, Shuvon B, NP      . hydrOXYzine (ATARAX/VISTARIL) tablet 50 mg  50 mg Oral Q6H PRN Pennelope Bracken, MD   50 mg at 04/01/18 0240  . methocarbamol (ROBAXIN) tablet 500 mg  500 mg Oral Q8H PRN Lindell Spar I, NP      . multivitamin with minerals tablet 1 tablet  1 tablet Oral Daily Rankin, Shuvon B, NP   1 tablet at 04/01/18 0755  . naproxen  (NAPROSYN) tablet 500 mg  500 mg Oral BID PRN Lindell Spar I, NP   500 mg at 03/29/18 1718  . nicotine polacrilex (NICORETTE) gum 2 mg  2 mg Oral PRN Cobos, Myer Peer, MD   2 mg at 03/30/18 1038  . ondansetron (ZOFRAN-ODT) disintegrating tablet 4 mg  4 mg Oral Q6H PRN Lindon Romp A, NP   4 mg at 04/01/18 0240  . polyethylene glycol (MIRALAX / GLYCOLAX) packet 17 g  17 g Oral Daily Rankin, Shuvon B, NP   17 g at 04/01/18 0754  . prazosin (MINIPRESS) capsule 1 mg  1 mg Oral QHS Money, Lowry Ram, FNP   1 mg at 03/31/18 2211  . sulfamethoxazole-trimethoprim (BACTRIM DS,SEPTRA DS) 800-160 MG per tablet 1 tablet  1 tablet Oral Q12H Dara Hoyer, PA-C   1 tablet at 04/01/18 0755  . thiamine (VITAMIN B-1) tablet 100 mg  100 mg Oral Daily Rankin, Shuvon B, NP   100 mg at 04/01/18 0755  . topiramate (TOPAMAX) tablet 50 mg  50 mg Oral QHS Money, Lowry Ram, FNP   50 mg at 03/31/18 2211   PTA Medications: Medications Prior to Admission  Medication Sig Dispense Refill Last Dose  . amoxicillin (AMOXIL) 875 MG tablet Take 1  tablet (875 mg total) by mouth 2 (two) times daily. 24 tablet 0   . gabapentin (NEURONTIN) 300 MG capsule Take 1 capsule (300 mg total) by mouth 2 (two) times daily.     . hydrocortisone (ANUSOL-HC) 2.5 % rectal cream Place rectally 3 (three) times daily. 30 g 0     Patient Stressors: Marital or family conflict Medication change or noncompliance Substance abuse  Patient Strengths: Average or above average intelligence General fund of knowledge Supportive family/friends  Treatment Modalities: Medication Management, Group therapy, Case management,  1 to 1 session with clinician, Psychoeducation, Recreational therapy.   Physician Treatment Plan for Primary Diagnosis: Bipolar 2 Disorder  Medication Management: Evaluate patient's response, side effects, and tolerance of medication regimen.  Therapeutic Interventions: 1 to 1 sessions, Unit Group sessions and Medication  administration.  Evaluation of Outcomes: Progressing  Physician Treatment Plan for Secondary Diagnosis: Principal Problem:   Polysubstance dependence including opioid drug with daily use ([place]) Active Problems:   PTSD (post-traumatic stress disorder)   Bipolar I disorder, current or most recent episode depressed, with psychotic features ([place])   Medication Management: Evaluate patient's response, side effects, and tolerance of medication regimen.  Therapeutic Interventions: 1 to 1 sessions, Unit Group sessions and Medication administration.  Evaluation of Outcomes: Progressing   RN Treatment Plan for Primary Diagnosis: Bipolar 2 Disorder Long Term Goal(s): Knowledge of disease and therapeutic regimen to maintain health will improve  Short Term Goals: Ability to remain free from injury will improve, Ability to disclose and discuss suicidal ideas and Ability to identify and develop effective coping behaviors will improve  Medication Management: RN will administer medications as ordered by provider, will assess and evaluate patient's response and provide education to patient for prescribed medication. RN will report any adverse and/or side effects to prescribing provider.  Therapeutic Interventions: 1 on 1 counseling sessions, Psychoeducation, Medication administration, Evaluate responses to treatment, Monitor vital signs and CBGs as ordered, Perform/monitor CIWA, COWS, AIMS and Fall Risk screenings as ordered, Perform wound care treatments as ordered.  Evaluation of Outcomes: Progressing   LCSW Treatment Plan for Primary Diagnosis:Bipolar 2 Disorder Long Term Goal(s): Safe transition to appropriate next level of care at discharge, Engage patient in therapeutic group addressing interpersonal concerns.  Short Term Goals: Engage patient in aftercare planning with referrals and resources, Facilitate patient progression through stages of change regarding substance use diagnoses and concerns  and Identify triggers associated with mental health/substance abuse issues  Therapeutic Interventions: Assess for all discharge needs, 1 to 1 time with Social worker, Explore available resources and support systems, Assess for adequacy in community support network, Educate family and significant other(s) on suicide prevention, Complete Psychosocial Assessment, Interpersonal group therapy.  Evaluation of Outcomes: Progressing   Progress in Treatment: Attending groups: Yes. Participating in groups: Yes. Taking medication as prescribed: Yes. Toleration medication: Yes. Family/Significant other contact made: SPE completed with pt; pt declined to consent to collateral contact.  Patient understands diagnosis: Yes. Discussing patient identified problems/goals with staff: Yes. Medical problems stabilized or resolved: Yes. Denies suicidal/homicidal ideation: Yes Issues/concerns per patient self-inventory: No. Other: n/a  New problem(s) identified: No, Describe:  n/a  New Short Term/Long Term Goal(s): detox, medication management for mood stabilization; elimination of SI thoughts; development of comprehensive mental wellness/sobriety plan.   Patient Goals:  "I need help. I need to get into treatment."   Discharge Plan or Barriers: Pt has [facility] screening for Thursday 11/7 and has been provided with [facility] information per her request. Pt  plans to follow-up there after completing [facility] for outpatient if pt chooses to remain in the area. [place] pamphlet, [facility] information, and AA/NA information provided to patient for additional community support and resources.   Reason for Continuation of Hospitalization: Anxiety Depression Medication stabilizations  Estimated Length of Stay: Thursday morning 04/04/18 to [facility] via taxi.   Attendees: Patient: 04/01/2018 12:49 PM  Physician: Dr. Nancy Fetter MD 04/01/2018 12:49 PM  Nursing: Yetta Flock RN; Estill Bamberg RN  04/01/2018 12:49 PM  RN Care Manager:x 04/01/2018 12:49 PM  Social Worker: Janice Norrie LCSW 04/01/2018 12:49 PM  Recreational Therapist: x 04/01/2018 12:49 PM  Other: Lindell Spar NP 04/01/2018 12:49 PM  Other:  04/01/2018 12:49 PM  Other: 04/01/2018 12:49 PM    Scribe for Treatment Team: [name], LCSW 04/01/2018 12:49 PM
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
## 2018-04-02 MED ORDER — QUETIAPINE FUMARATE 100 MG PO TABS
100.0000 mg | ORAL_TABLET | Freq: Every day | ORAL | Status: DC
  Administered 2018-04-02 – 2018-04-03 (×2): 100 mg via ORAL
  Filled 2018-04-02: qty 7
  Filled 2018-04-02 (×2): qty 1
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
## 2018-04-02 NOTE — Progress Notes (Signed)
Patient ID: Sarah Cervantes, female   DOB: 20-Sep-1980, 37 y.o.   MRN: [id number]  D: Patient pleasant on approach today. Reports some mood improvement since admission. No complaints tonight about rash but did request benadryl at bedtime.  No active SI at this time. A: Staff will monitor on q 15 minute checks, follow treatment plan, and give medication as ordered. R: Cooperative on the unit. Took medication as ordered.

## 2018-04-02 NOTE — Progress Notes (Signed)
D:  Patient's self inventory sheet, patient has poor sleep, sleep medication not helpful.  Fair appetite, low energy level, poor concentration.  Rated depression, hopeless and anxiety #3.  Denied withdrawals.  Denied SI.  Denied physical problems.  Denied physical pain.  Goal is sleep.  Plans to sleep.  Does have discharge plans. A:  Medications administered per MD orders.  Emotional support and encouragement given patient. R:  Denied SI and HI, contracts for safety.  Denied A/V hallucinations. Safety maintained with 15 minute checks.

## 2018-04-02 NOTE — Progress Notes (Signed)
D: Patient is alert, oriented, pleasant, and cooperative. Patient denies SI, HI, AVH, and verbally contracts for safety. Patient reports she slept better last night, waking up a couple times but able to go back to sleep. Patient denies physical symptoms/pain. Patient requests PRN sleep medication. Patient inquiring about a package she is supposed to be receiving.    A: Scheduled medications administered per MD order. PRN sleep medication administered per MD order. Support provided. Patient educated on safety on the unit and medications. Routine safety checks every 15 minutes. Patient stated understanding to tell nurse about any new physical symptoms. Patient understands to tell staff of any needs.     R: No adverse drug reactions noted. Patient verbally contracts for safety. Patient remains safe at this time and will continue to monitor.

## 2018-04-02 NOTE — Plan of Care (Signed)
Nurse discussed anxiety, depression, coping skills with patient. 

## 2018-04-02 NOTE — Plan of Care (Signed)
  Problem: Education: Goal: Emotional status will improve Outcome: Progressing Goal: Mental status will improve Outcome: Progressing   

## 2018-04-02 NOTE — Progress Notes (Signed)
Recreation Therapy Notes  Animal-Assisted Activity (AAA) Program Checklist/Progress Notes Patient Eligibility Criteria Checklist & Daily Group note for Rec Tx Intervention  Date: 11.5.19 Time: 73 Location: [address]   AAA/T Program Assumption of Risk Form signed by Teacher, music or Parent Legal Guardian YES   Patient is free of allergies or sever asthma  YES   Patient reports no fear of animals  YES   Patient reports no history of cruelty to animals YES   Patient understands his/her participation is voluntary YES   Patient washes hands before animal contact  YES   Patient washes hands after animal contact  YES NO:[id number]}  Behavioral Response: Engaged  Education: Contractor, Appropriate Animal Interaction   Education Outcome: Acknowledges understanding/In group clarification offered/Needs additional education.   Clinical Observations/Feedback: Pt attended and participated in activity.    Victorino Sparrow, LRT/CTRS         Ria Comment, Elira Colasanti A 04/02/2018 3:50 PM

## 2018-04-02 NOTE — Progress Notes (Signed)
Adult Psychoeducational Group Note  Date:  04/02/2018 Time:  10:14 PM  Group Topic/Focus:  Wrap-Up Group:   The focus of this group is to help patients review their daily goal of treatment and discuss progress on daily workbooks.  Participation Level:  Active  Participation Quality:  Appropriate  Affect:  Appropriate  Cognitive:  Appropriate  Insight: Appropriate  Engagement in Group:  Engaged  Modes of Intervention:  Education  Additional Comments: Patient attended group and said that her day was a 4.  Her positive word for today is "good:[place] Coach Cline Draheim 52/12/4130, 10:14 PM

## 2018-04-02 NOTE — BHH Group Notes (Signed)
[facility] Mental Health Association Group Therapy 04/02/2018 1:15pm  Type of Therapy: Mental Health Association Presentation  Participation Level: DID NOT ATTEND> pt invited. Chose to remain in bed.   [name], LCSW 04/02/2018 3:09 PM

## 2018-04-03 MED ORDER — PRAZOSIN HCL 1 MG PO CAPS: 1.0000 mg | ORAL_CAPSULE | Freq: Every day | ORAL | 0 refills | Status: DC

## 2018-04-03 MED ORDER — QUETIAPINE FUMARATE 100 MG PO TABS: 100.0000 mg | ORAL_TABLET | Freq: Every day | ORAL | 0 refills | Status: DC

## 2018-04-03 MED ORDER — DULOXETINE HCL 30 MG PO CPEP: 30.0000 mg | ORAL_CAPSULE | Freq: Every day | ORAL | 0 refills | Status: DC

## 2018-04-03 MED ORDER — GABAPENTIN 300 MG PO CAPS: 300.0000 mg | ORAL_CAPSULE | Freq: Three times a day (TID) | ORAL | 1 refills | Status: DC

## 2018-04-03 MED ORDER — HYDROXYZINE HCL 50 MG PO TABS: 50.0000 mg | ORAL_TABLET | Freq: Four times a day (QID) | ORAL | 0 refills | Status: DC | PRN

## 2018-04-03 MED ORDER — TOPIRAMATE 50 MG PO TABS: 50.0000 mg | ORAL_TABLET | Freq: Every day | ORAL | 0 refills | Status: DC

## 2018-04-03 NOTE — BHH Suicide Risk Assessment (Signed)
[facility] Discharge Suicide Risk Assessment   Principal Problem: Polysubstance dependence including opioid drug with daily use [facility]) Discharge Diagnoses:  Patient Active Problem List   Diagnosis Date Noted  . Bipolar I disorder, current or most recent episode depressed, with psychotic features ([place]) [F31.5] 03/26/2018  . Bacteremia due to Gram-negative bacteria [R78.81] 03/25/2018  . Cocaine abuse with cocaine-induced mood disorder ([place]) [F14.14] 03/24/2018  . PTSD (post-traumatic stress disorder) [F43.10] 03/24/2018  . Bipolar II disorder ([place]) [F31.81] 03/24/2018  . Sepsis, Gram negative ([place]) [A41.50] 03/23/2018  . Alcohol abuse with intoxication ([place]) [F10.129] 03/23/2018  . Polysubstance dependence including opioid drug with daily use ([place]) [F11.20, F19.20] 03/23/2018    Total Time spent with patient: 30 minutes  Musculoskeletal: Strength & Muscle Tone: within normal limits Gait & Station: normal Patient leans: N/A  Psychiatric Specialty Exam: Review of Systems  Constitutional: Negative for chills and fever.  Respiratory: Negative for cough and shortness of breath.   Cardiovascular: Negative for chest pain.  Gastrointestinal: Negative for abdominal pain, heartburn, nausea and vomiting.  Psychiatric/Behavioral: Negative for depression, hallucinations and suicidal ideas. The patient is not nervous/anxious and does not have insomnia.     Blood pressure (!) 142/91, pulse 86, temperature 97.6 F (36.4 C), temperature source Oral, resp. rate (!) 24, height 5\' 4"  (1.626 m), weight 135.6 kg, last menstrual period 03/23/2018, SpO2 98 %.Body mass index is 51.32 kg/m.   Mental Status Per Nursing Assessment::   On Admission:  Suicidal ideation indicated by patient, Suicide plan, Self-harm thoughts, Intention to act on suicide plan  Demographic Factors:  Caucasian, Low socioeconomic status and Unemployed  Loss Factors: Financial problems/change in socioeconomic status  Historical  Factors: Prior suicide attempts and Impulsivity  Risk Reduction Factors:   Positive coping skills or problem solving skills  Continued Clinical Symptoms:  Bipolar Disorder:   Depressive phase Alcohol/Substance Abuse/Dependencies  Cognitive Features That Contribute To Risk:  None    Suicide Risk:  Minimal: No identifiable suicidal ideation.  Patients presenting with no risk factors but with morbid ruminations; may be classified as minimal risk based on the severity of the depressive symptoms  Follow-up Information    [name] Follow up.   Specialty:  Behavioral Health Why:  Please walk in within 3 days of hospital/residential treatment discharge to be assessed for outpatient mental health services including medication management and therapy. Walk in hours: Monday-Friday 8am-10am. Thank you.  Contact information: [place] [address] [phone]        Services, [facility] Follow up on 04/04/2018.   Why:  Screening for possible admission on Thursday, 11/7 at 7:45AM. Please bring: photo ID/proof of [place] residence, 14 day supply of medications and prescriptions provided by hospital, and clothing. (No leggings allowed).  Contact information: [address] [place] [address] [phone]           Plan Of Care/Follow-up recommendations:  Activity:  as tolerated Diet:  normal Tests:  NA Other:  see above for [place], MD 04/03/2018, 3:04 PM

## 2018-04-03 NOTE — Progress Notes (Signed)
  [facility] Adult Case Management Discharge Plan :  Will you be returning to the same living situation after discharge:  No.Pt plans to attend [facility] for screening on Thursday, 11/7 At discharge, do you have transportation home?: Yes,  taxi voucher in chart. PATIENT MUST DISCHARGE BY 7:00am ON THURSDAY, 11/7 IN ORDER TO GET TO [place] RESIDENTIAL IN TIME FOR HER SCREENING. TAXI VOUCHER IN CHART. Do you have the ability to pay for your medications: Yes,  mental health  Release of information consent forms completed and submitted to medical records by CSW.   Patient to Follow up at: Follow-up Information    [name] Follow up.   Specialty:  Behavioral Health Why:  Please walk in within 3 days of hospital/residential treatment discharge to be assessed for outpatient mental health services including medication management and therapy. Walk in hours: Monday-Friday 8am-10am. Thank you.  Contact information: [place] [address] [phone]        [facility], [facility] Follow up on 04/04/2018.   Why:  Screening for possible admission on Thursday, 11/7 at 7:45AM. Please bring: photo ID/proof of [place] residence, 14 day supply of medications and prescriptions provided by hospital, and clothing. (No leggings allowed).  Contact information: [place] [address] [phone]           Next level of care provider has access to [place] and Suicide Prevention discussed: Yes,  SPE completed with pt; pt declined to consent to collateral contact. SPI pamphlet and mobile crisis information provided.   Have you used any form of tobacco in the last 30 days? (Cigarettes, Smokeless Tobacco, Cigars, and/or Pipes): Yes  Has patient been referred to the Quitline?: Patient refused referral  Patient has been referred for addiction treatment: Yes  [name], LCSW 04/03/2018, 9:09 AM

## 2018-04-03 NOTE — Progress Notes (Signed)
Adult Psychoeducational Group Note  Date:  04/03/2018 Time:  6:29 PM  Group Topic/Focus:  Goals Group:   The focus of this group is to help patients establish daily goals to achieve during treatment and discuss how the patient can incorporate goal setting into their daily lives to aide in recovery.  Participation Level:  Active  Participation Quality:  Appropriate  Affect:  Appropriate  Cognitive:  Alert  Insight: Good  Engagement in Group:  Engaged  Modes of Intervention:  Discussion  Additional Comments:  Pt attended group and participated in group discussion.  Anjana Cheek R Itzy Adler 04/03/2018, 6:29 PM
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
## 2018-04-03 NOTE — Progress Notes (Addendum)
D: Patient has been irritable, however, she apologized. She states she is going to [employer].  She states, "it's a fresh start." Belongings were brought in for patient and put in her locker.  She denies any thoughts of self harm.  She has been observed on the telephone multiple times.  She has been compliant with her medications.  Patient will be an early discharge to [facility] tomorrow.  A: Continue to monitor medication management and MD orders.  Safety checks completed every 15 minutes per protocol.  Offer support and encouragement as needed.  R: Patient is receptive to staff; her behavior is appropriate.

## 2018-04-04 NOTE — Progress Notes (Signed)
D:  Sarah Cervantes was up and visible on the unit.  She was in the day room much of the evening playing cards with her peers.  She attended evening AA group.  She denied SI/HI or A/V hallucinations.  She is looking forward to being discharged in the morning. A:  1:1 with RN for support and encouragement.  Medications as ordered.  Q 15 minute checks maintained for safety.  Encouraged participation in group and unit activities.   R:  Sarah Cervantes remains safe on the unit.  We will continue to monitor the progress towards her goals.

## 2018-04-04 NOTE — Progress Notes (Signed)
Sarah Cervantes D/C from the unit to taxi taking her to [facility] for assessment.  She was pleasant and cooperative. She voiced no SI/HI.  Alert and oriented X 3.  Affect bright.  D/C follow up paperwork reviewed with pt and copy sent as well as prescriptions.  Belongings (from locker 25) returned to pt. Q 15 min checks maintained until discharge.  Ranya left the unit in no apparent distress

## 2018-04-09 ENCOUNTER — Encounter (HOSPITAL_COMMUNITY): Payer: Self-pay

## 2018-04-09 ENCOUNTER — Other Ambulatory Visit: Payer: Self-pay

## 2018-04-09 ENCOUNTER — Emergency Department (HOSPITAL_COMMUNITY)
Admission: EM | Admit: 2018-04-09 | Discharge: 2018-04-10 | Disposition: A | Discharge status: Other Acute Inpatient Hospital | Payer: Self-pay | Attending: Emergency Medicine | Admitting: Emergency Medicine

## 2018-04-09 DIAGNOSIS — F319 Bipolar disorder, unspecified: Secondary | ICD-10-CM | POA: Insufficient documentation

## 2018-04-09 DIAGNOSIS — T148XXA Other injury of unspecified body region, initial encounter: Secondary | ICD-10-CM

## 2018-04-09 DIAGNOSIS — Y9389 Activity, other specified: Secondary | ICD-10-CM | POA: Insufficient documentation

## 2018-04-09 DIAGNOSIS — S80811A Abrasion, right lower leg, initial encounter: Secondary | ICD-10-CM | POA: Insufficient documentation

## 2018-04-09 DIAGNOSIS — Y998 Other external cause status: Secondary | ICD-10-CM | POA: Insufficient documentation

## 2018-04-09 DIAGNOSIS — F1010 Alcohol abuse, uncomplicated: Secondary | ICD-10-CM | POA: Insufficient documentation

## 2018-04-09 DIAGNOSIS — F32A Depression, unspecified: Secondary | ICD-10-CM

## 2018-04-09 DIAGNOSIS — Z79899 Other long term (current) drug therapy: Secondary | ICD-10-CM | POA: Insufficient documentation

## 2018-04-09 DIAGNOSIS — F329 Major depressive disorder, single episode, unspecified: Secondary | ICD-10-CM

## 2018-04-09 DIAGNOSIS — Z91040 Latex allergy status: Secondary | ICD-10-CM | POA: Insufficient documentation

## 2018-04-09 DIAGNOSIS — Y929 Unspecified place or not applicable: Secondary | ICD-10-CM | POA: Insufficient documentation

## 2018-04-09 DIAGNOSIS — F1110 Opioid abuse, uncomplicated: Secondary | ICD-10-CM | POA: Insufficient documentation

## 2018-04-09 DIAGNOSIS — F1410 Cocaine abuse, uncomplicated: Secondary | ICD-10-CM | POA: Insufficient documentation

## 2018-04-09 DIAGNOSIS — F17200 Nicotine dependence, unspecified, uncomplicated: Secondary | ICD-10-CM | POA: Insufficient documentation

## 2018-04-09 DIAGNOSIS — R45851 Suicidal ideations: Secondary | ICD-10-CM | POA: Insufficient documentation

## 2018-04-09 LAB — I-STAT BETA HCG BLOOD, ED (MC, WL, AP ONLY)

## 2018-04-09 LAB — COMPREHENSIVE METABOLIC PANEL
ALT: 32 U/L (ref 0–44)
ANION GAP: 10 (ref 5–15)
AST: 23 U/L (ref 15–41)
Albumin: 3.7 g/dL (ref 3.5–5.0)
Alkaline Phosphatase: 63 U/L (ref 38–126)
BILIRUBIN TOTAL: 0.5 mg/dL (ref 0.3–1.2)
BUN: 16 mg/dL (ref 6–20)
CHLORIDE: 104 mmol/L (ref 98–111)
CO2: 23 mmol/L (ref 22–32)
Calcium: 8.8 mg/dL — ABNORMAL LOW (ref 8.9–10.3)
Creatinine, Ser: 1.08 mg/dL — ABNORMAL HIGH (ref 0.44–1.00)
GFR calc Af Amer: >60 mL/min (ref >60)
Glucose, Bld: 95 mg/dL (ref 70–99)
POTASSIUM: 3.5 mmol/L (ref 3.5–5.1)
Sodium: 137 mmol/L (ref 135–145)
Total Protein: 7.7 g/dL (ref 6.5–8.1)

## 2018-04-09 LAB — CBC
HCT: 39.8 % (ref 36.0–46.0)
Hemoglobin: 12.6 g/dL (ref 12.0–15.0)
MCH: 30.4 pg (ref 26.0–34.0)
MCHC: 31.7 g/dL (ref 30.0–36.0)
MCV: 96.1 fL (ref 80.0–100.0)
NRBC: 0 % (ref 0.0–0.2)
PLATELETS: 478 10*3/uL — AB (ref 150–400)
RBC: 4.14 MIL/uL (ref 3.87–5.11)
RDW: 15.8 % — AB (ref 11.5–15.5)
WBC: 16.4 10*3/uL — AB (ref 4.0–10.5)

## 2018-04-09 LAB — SALICYLATE LEVEL

## 2018-04-09 LAB — ACETAMINOPHEN LEVEL: Acetaminophen (Tylenol), Serum: <10 ug/mL — ABNORMAL LOW (ref 10–30)

## 2018-04-09 LAB — ETHANOL
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
## 2018-04-09 NOTE — ED Notes (Signed)
Pt denies any avh, and hi. Pt states she's suicidal without a plan. Pt contract to safety. Pt resting in room, will continue to monitor.
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
## 2018-04-09 NOTE — ED Notes (Signed)
Pt states she believes cellulitis to her right lower leg is returning. Pitting edema noted to left extremity. Dr Betsey Holiday made aware.

## 2018-04-09 NOTE — ED Notes (Signed)
Bed: MCE02 Expected date:  Expected time:  Means of arrival:  Comments: Hold for triage 3

## 2018-04-10 ENCOUNTER — Encounter (HOSPITAL_COMMUNITY): Payer: Self-pay | Admitting: *Deleted

## 2018-04-10 ENCOUNTER — Inpatient Hospital Stay (HOSPITAL_COMMUNITY)
Admission: AD | Admit: 2018-04-10 | Discharge: 2018-04-23 | DRG: 885 | Disposition: A | Discharge status: Home / Self Care | Payer: Federal, State, Local not specified - Other | Source: Intra-hospital | Attending: Psychiatry | Admitting: Psychiatry

## 2018-04-10 ENCOUNTER — Other Ambulatory Visit: Payer: Self-pay

## 2018-04-10 DIAGNOSIS — Z91040 Latex allergy status: Secondary | ICD-10-CM

## 2018-04-10 DIAGNOSIS — N760 Acute vaginitis: Secondary | ICD-10-CM | POA: Diagnosis present

## 2018-04-10 DIAGNOSIS — S8990XA Unspecified injury of unspecified lower leg, initial encounter: Secondary | ICD-10-CM | POA: Diagnosis present

## 2018-04-10 DIAGNOSIS — W06XXXA Fall from bed, initial encounter: Secondary | ICD-10-CM | POA: Diagnosis not present

## 2018-04-10 DIAGNOSIS — F10239 Alcohol dependence with withdrawal, unspecified: Secondary | ICD-10-CM | POA: Diagnosis not present

## 2018-04-10 DIAGNOSIS — J40 Bronchitis, not specified as acute or chronic: Secondary | ICD-10-CM | POA: Diagnosis present

## 2018-04-10 DIAGNOSIS — F515 Nightmare disorder: Secondary | ICD-10-CM | POA: Diagnosis present

## 2018-04-10 DIAGNOSIS — Z915 Personal history of self-harm: Secondary | ICD-10-CM | POA: Diagnosis not present

## 2018-04-10 DIAGNOSIS — F4310 Post-traumatic stress disorder, unspecified: Secondary | ICD-10-CM | POA: Diagnosis present

## 2018-04-10 DIAGNOSIS — F1120 Opioid dependence, uncomplicated: Secondary | ICD-10-CM | POA: Diagnosis present

## 2018-04-10 DIAGNOSIS — R45851 Suicidal ideations: Secondary | ICD-10-CM | POA: Diagnosis present

## 2018-04-10 DIAGNOSIS — K5904 Chronic idiopathic constipation: Secondary | ICD-10-CM | POA: Diagnosis present

## 2018-04-10 DIAGNOSIS — Z818 Family history of other mental and behavioral disorders: Secondary | ICD-10-CM

## 2018-04-10 DIAGNOSIS — G4700 Insomnia, unspecified: Secondary | ICD-10-CM | POA: Diagnosis present

## 2018-04-10 DIAGNOSIS — B9689 Other specified bacterial agents as the cause of diseases classified elsewhere: Secondary | ICD-10-CM | POA: Diagnosis present

## 2018-04-10 DIAGNOSIS — F17210 Nicotine dependence, cigarettes, uncomplicated: Secondary | ICD-10-CM | POA: Diagnosis present

## 2018-04-10 DIAGNOSIS — K5900 Constipation, unspecified: Secondary | ICD-10-CM | POA: Diagnosis present

## 2018-04-10 DIAGNOSIS — F1920 Other psychoactive substance dependence, uncomplicated: Secondary | ICD-10-CM | POA: Diagnosis present

## 2018-04-10 DIAGNOSIS — Y92230 Patient room in hospital as the place of occurrence of the external cause: Secondary | ICD-10-CM | POA: Diagnosis not present

## 2018-04-10 DIAGNOSIS — F419 Anxiety disorder, unspecified: Secondary | ICD-10-CM | POA: Diagnosis present

## 2018-04-10 DIAGNOSIS — F315 Bipolar disorder, current episode depressed, severe, with psychotic features: Secondary | ICD-10-CM | POA: Diagnosis present

## 2018-04-10 DIAGNOSIS — W19XXXA Unspecified fall, initial encounter: Secondary | ICD-10-CM

## 2018-04-10 DIAGNOSIS — Z79899 Other long term (current) drug therapy: Secondary | ICD-10-CM | POA: Diagnosis not present

## 2018-04-10 DIAGNOSIS — J069 Acute upper respiratory infection, unspecified: Secondary | ICD-10-CM | POA: Diagnosis present

## 2018-04-10 DIAGNOSIS — G43909 Migraine, unspecified, not intractable, without status migrainosus: Secondary | ICD-10-CM | POA: Diagnosis present

## 2018-04-10 DIAGNOSIS — I10 Essential (primary) hypertension: Secondary | ICD-10-CM | POA: Diagnosis present

## 2018-04-10 DIAGNOSIS — Y900 Blood alcohol level of less than 20 mg/100 ml: Secondary | ICD-10-CM | POA: Diagnosis present

## 2018-04-10 LAB — RAPID URINE DRUG SCREEN, HOSP PERFORMED
Amphetamines: NOT DETECTED
BENZODIAZEPINES: NOT DETECTED
Barbiturates: NOT DETECTED
COCAINE: POSITIVE — AB
Opiates: NOT DETECTED
Tetrahydrocannabinol: NOT DETECTED

## 2018-04-10 MED ORDER — TOPIRAMATE 25 MG PO TABS
50.0000 mg | ORAL_TABLET | Freq: Every day | ORAL | Status: DC
  Administered 2018-04-10 – 2018-04-11 (×2): 50 mg via ORAL
  Filled 2018-04-10 (×3): qty 2

## 2018-04-10 MED ORDER — THIAMINE HCL 100 MG/ML IJ SOLN: 100.0000 mg | Freq: Every day | INTRAMUSCULAR | Status: DC

## 2018-04-10 MED ORDER — MUPIROCIN CALCIUM 2 % EX CREA
TOPICAL_CREAM | Freq: Two times a day (BID) | CUTANEOUS | Status: DC
  Administered 2018-04-10: 01:00:00 via TOPICAL
  Filled 2018-04-10: qty 15

## 2018-04-10 MED ORDER — NICOTINE 21 MG/24HR TD PT24
21.0000 mg | MEDICATED_PATCH | Freq: Every day | TRANSDERMAL | Status: DC
  Filled 2018-04-10 (×3): qty 1

## 2018-04-10 MED ORDER — QUETIAPINE FUMARATE 100 MG PO TABS
100.0000 mg | ORAL_TABLET | Freq: Every day | ORAL | Status: DC
  Administered 2018-04-10: 100 mg via ORAL
  Filled 2018-04-10 (×2): qty 1

## 2018-04-10 MED ORDER — HYDROXYZINE HCL 50 MG PO TABS
50.0000 mg | ORAL_TABLET | Freq: Four times a day (QID) | ORAL | Status: DC | PRN
  Administered 2018-04-10 – 2018-04-15 (×5): 50 mg via ORAL
  Filled 2018-04-10 (×5): qty 1

## 2018-04-10 MED ORDER — ONDANSETRON HCL 4 MG PO TABS: 4.0000 mg | ORAL_TABLET | Freq: Three times a day (TID) | ORAL | Status: DC | PRN

## 2018-04-10 MED ORDER — PRAZOSIN HCL 1 MG PO CAPS
1.0000 mg | ORAL_CAPSULE | Freq: Every day | ORAL | Status: DC
  Administered 2018-04-10: 1 mg via ORAL
  Filled 2018-04-10: qty 1

## 2018-04-10 MED ORDER — MAGNESIUM HYDROXIDE 400 MG/5ML PO SUSP
30.0000 mL | Freq: Every day | ORAL | Status: DC | PRN
  Administered 2018-04-18: 30 mL via ORAL
  Filled 2018-04-10: qty 30

## 2018-04-10 MED ORDER — LORAZEPAM 1 MG PO TABS: 0.0000 mg | ORAL_TABLET | Freq: Four times a day (QID) | ORAL | Status: DC

## 2018-04-10 MED ORDER — PRAZOSIN HCL 1 MG PO CAPS
1.0000 mg | ORAL_CAPSULE | Freq: Every day | ORAL | Status: DC
  Administered 2018-04-10 – 2018-04-22 (×13): 1 mg via ORAL
  Filled 2018-04-10: qty 1
  Filled 2018-04-10: qty 7
  Filled 2018-04-10 (×3): qty 1
  Filled 2018-04-10: qty 7
  Filled 2018-04-10 (×11): qty 1

## 2018-04-10 MED ORDER — MUPIROCIN CALCIUM 2 % EX CREA
TOPICAL_CREAM | Freq: Two times a day (BID) | CUTANEOUS | Status: DC
  Administered 2018-04-10: 1 via TOPICAL
  Administered 2018-04-12 – 2018-04-17 (×5): via TOPICAL
  Filled 2018-04-10 (×2): qty 15

## 2018-04-10 MED ORDER — DULOXETINE HCL 30 MG PO CPEP
30.0000 mg | ORAL_CAPSULE | Freq: Every day | ORAL | Status: DC
  Administered 2018-04-10 – 2018-04-23 (×14): 30 mg via ORAL
  Filled 2018-04-10 (×2): qty 1
  Filled 2018-04-10: qty 7
  Filled 2018-04-10 (×14): qty 1
  Filled 2018-04-10: qty 7

## 2018-04-10 MED ORDER — ACETAMINOPHEN 325 MG PO TABS
650.0000 mg | ORAL_TABLET | ORAL | Status: DC | PRN
  Administered 2018-04-11 – 2018-04-18 (×9): 650 mg via ORAL
  Filled 2018-04-10 (×9): qty 2

## 2018-04-10 MED ORDER — NICOTINE POLACRILEX 2 MG MT GUM
2.0000 mg | CHEWING_GUM | OROMUCOSAL | Status: DC | PRN
  Administered 2018-04-10 – 2018-04-11 (×2): 2 mg via ORAL
  Filled 2018-04-10: qty 1

## 2018-04-10 MED ORDER — LOPERAMIDE HCL 2 MG PO CAPS: 2.0000 mg | ORAL_CAPSULE | ORAL | Status: AC | PRN

## 2018-04-10 MED ORDER — LORAZEPAM 1 MG PO TABS: 0.0000 mg | ORAL_TABLET | Freq: Two times a day (BID) | ORAL | Status: DC

## 2018-04-10 MED ORDER — TOPIRAMATE 25 MG PO TABS
50.0000 mg | ORAL_TABLET | Freq: Every day | ORAL | Status: DC
  Administered 2018-04-10: 50 mg via ORAL
  Filled 2018-04-10: qty 2

## 2018-04-10 MED ORDER — QUETIAPINE FUMARATE 100 MG PO TABS
100.0000 mg | ORAL_TABLET | Freq: Every day | ORAL | Status: DC
  Administered 2018-04-10: 100 mg via ORAL
  Filled 2018-04-10: qty 1

## 2018-04-10 MED ORDER — LORAZEPAM 2 MG/ML IJ SOLN: 0.0000 mg | Freq: Four times a day (QID) | INTRAMUSCULAR | Status: DC

## 2018-04-10 MED ORDER — VITAMIN B-1 100 MG PO TABS: 100.0000 mg | ORAL_TABLET | Freq: Every day | ORAL | Status: DC

## 2018-04-10 MED ORDER — ZOLPIDEM TARTRATE 5 MG PO TABS: 5.0000 mg | ORAL_TABLET | Freq: Every evening | ORAL | Status: DC | PRN

## 2018-04-10 MED ORDER — HYDROXYZINE HCL 25 MG PO TABS: 50.0000 mg | ORAL_TABLET | Freq: Four times a day (QID) | ORAL | Status: DC | PRN

## 2018-04-10 MED ORDER — ALUM & MAG HYDROXIDE-SIMETH 200-200-20 MG/5ML PO SUSP: 30.0000 mL | ORAL | Status: DC | PRN

## 2018-04-10 MED ORDER — LORAZEPAM 1 MG PO TABS: 1.0000 mg | ORAL_TABLET | Freq: Four times a day (QID) | ORAL | Status: AC | PRN

## 2018-04-10 MED ORDER — GABAPENTIN 300 MG PO CAPS
300.0000 mg | ORAL_CAPSULE | Freq: Three times a day (TID) | ORAL | Status: DC
  Administered 2018-04-10 – 2018-04-16 (×17): 300 mg via ORAL
  Filled 2018-04-10 (×29): qty 1

## 2018-04-10 MED ORDER — ACETAMINOPHEN 325 MG PO TABS: 650.0000 mg | ORAL_TABLET | ORAL | Status: DC | PRN

## 2018-04-10 MED ORDER — LORAZEPAM 2 MG/ML IJ SOLN: 0.0000 mg | Freq: Two times a day (BID) | INTRAMUSCULAR | Status: DC

## 2018-04-10 MED ORDER — NICOTINE 21 MG/24HR TD PT24: 21.0000 mg | MEDICATED_PATCH | Freq: Every day | TRANSDERMAL | Status: DC

## 2018-04-10 MED ORDER — DULOXETINE HCL 30 MG PO CPEP: 30.0000 mg | ORAL_CAPSULE | Freq: Every day | ORAL | Status: DC

## 2018-04-10 MED ORDER — ONDANSETRON HCL 4 MG PO TABS
4.0000 mg | ORAL_TABLET | Freq: Three times a day (TID) | ORAL | Status: DC | PRN
  Administered 2018-04-10 – 2018-04-21 (×2): 4 mg via ORAL
  Filled 2018-04-10 (×2): qty 1

## 2018-04-10 MED ORDER — GABAPENTIN 300 MG PO CAPS: 300.0000 mg | ORAL_CAPSULE | Freq: Three times a day (TID) | ORAL | Status: DC

## 2018-04-10 NOTE — Plan of Care (Signed)
  Problem: Health Behavior/Discharge Planning: Goal: Compliance with treatment plan for underlying cause of condition will improve Outcome: Progressing   Problem: Physical Regulation: Goal: Ability to maintain clinical measurements within normal limits will improve Outcome: Progressing   Problem: Safety: Goal: Periods of time without injury will increase Outcome: Progressing   Baldo Daub, RN, BSN 04/10/2018 10:48 AM
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
## 2018-04-10 NOTE — ED Notes (Signed)
Pt transferred to [facility] via [facility]. Pt belongings given to [facility].
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
## 2018-04-10 NOTE — Progress Notes (Signed)
[facility] referral faxed per pt request. Pt is not eligible for [facility] for 90 days due to leaving program early last week.  Nusaiba Guallpa S. Ouida Sills, MSW, LCSW Clinical Social Worker 04/10/2018 1:00 PM

## 2018-04-10 NOTE — Tx Team (Signed)
Initial Treatment Plan 04/10/2018 5:26 AM [name] [id number]    PATIENT STRESSORS: Financial difficulties Medication change or noncompliance Substance abuse   PATIENT STRENGTHS: Ability for insight Average or above average intelligence Capable of independent living General fund of knowledge Motivation for treatment/growth   PATIENT IDENTIFIED PROBLEMS: Substance abuse  Risk for self harm  Depression  Homeless    "I need better coping skills"  "Learn how to live sober"         DISCHARGE CRITERIA:  Adequate post-discharge living arrangements Improved stabilization in mood, thinking, and/or behavior Motivation to continue treatment in a less acute level of care Verbal commitment to aftercare and medication compliance Withdrawal symptoms are absent or subacute and managed without 24-hour nursing intervention  PRELIMINARY DISCHARGE PLAN: Attend aftercare/continuing care group Outpatient therapy Placement in alternative living arrangements  PATIENT/FAMILY INVOLVEMENT: This treatment plan has been presented to and reviewed with the patient, Sarah Cervantes, and/or family member.  The patient and family have been given the opportunity to ask questions and make suggestions.  Ronney Asters, RN 04/10/2018, 5:26 AM
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
## 2018-04-10 NOTE — Tx Team (Signed)
Interdisciplinary Treatment and Diagnostic Plan Update  04/10/2018 Time of Session: 0830AM Sarah Cervantes MRN: [id number]  Principal Diagnosis: Bipolar Disorder   Secondary Diagnoses: Active Problems:   Severe recurrent major depression without psychotic features (HCC)   Current Medications:  Current Facility-Administered Medications  Medication Dose Route Frequency Provider Last Rate Last Dose  . acetaminophen (TYLENOL) tablet 650 mg  650 mg Oral Q4H PRN Lindon Romp A, NP      . alum & mag hydroxide-simeth (MAALOX/MYLANTA) 200-200-20 MG/5ML suspension 30 mL  30 mL Oral Q4H PRN Rozetta Nunnery, NP      . DULoxetine (CYMBALTA) DR capsule 30 mg  30 mg Oral Daily Lindon Romp A, NP      . gabapentin (NEURONTIN) capsule 300 mg  300 mg Oral TID Lindon Romp A, NP      . hydrOXYzine (ATARAX/VISTARIL) tablet 50 mg  50 mg Oral Q6H PRN Lindon Romp A, NP      . loperamide (IMODIUM) capsule 2-4 mg  2-4 mg Oral PRN Lindon Romp A, NP      . LORazepam (ATIVAN) tablet 1 mg  1 mg Oral Q6H PRN Lindon Romp A, NP      . magnesium hydroxide (MILK OF MAGNESIA) suspension 30 mL  30 mL Oral Daily PRN Lindon Romp A, NP      . mupirocin cream (BACTROBAN) 2 %   Topical BID Lindon Romp A, NP      . nicotine (NICODERM CQ - dosed in mg/24 hours) patch 21 mg  21 mg Transdermal Daily Lindon Romp A, NP      . ondansetron (ZOFRAN) tablet 4 mg  4 mg Oral Q8H PRN Lindon Romp A, NP      . prazosin (MINIPRESS) capsule 1 mg  1 mg Oral QHS Lindon Romp A, NP      . QUEtiapine (SEROQUEL) tablet 100 mg  100 mg Oral QHS Lindon Romp A, NP      . topiramate (TOPAMAX) tablet 50 mg  50 mg Oral QHS Lindon Romp A, NP       PTA Medications: Medications Prior to Admission  Medication Sig Dispense Refill Last Dose  . DULoxetine (CYMBALTA) 30 MG capsule Take 1 capsule (30 mg total) by mouth daily. 30 capsule 0 04/09/2018 at Unknown time  . gabapentin (NEURONTIN) 300 MG capsule Take 1 capsule (300 mg total) by mouth 3  (three) times daily. 45 capsule 1 Past Week at Unknown time  . hydrocortisone (ANUSOL-HC) 2.5 % rectal cream Place rectally 3 (three) times daily. 30 g 0 Past Month at Unknown time  . hydrOXYzine (ATARAX/VISTARIL) 50 MG tablet Take 1 tablet (50 mg total) by mouth every 6 (six) hours as needed for anxiety. 30 tablet 0 Past Week at Unknown time  . prazosin (MINIPRESS) 1 MG capsule Take 1 capsule (1 mg total) by mouth at bedtime. 30 capsule 0 Past Week at Unknown time  . QUEtiapine (SEROQUEL) 100 MG tablet Take 1 tablet (100 mg total) by mouth at bedtime. 30 tablet 0 Past Week at Unknown time  . topiramate (TOPAMAX) 50 MG tablet Take 1 tablet (50 mg total) by mouth at bedtime. 30 tablet 0 Past Week at Unknown time    Patient Stressors: Financial difficulties Medication change or noncompliance Substance abuse  Patient Strengths: Ability for insight Average or above average intelligence Capable of independent living [employer] of knowledge Motivation for treatment/growth  Treatment Modalities: Medication Management, Group therapy, Case management,  1 to 1 session with clinician,  Psychoeducation, Recreational therapy.   Physician Treatment Plan for Primary Diagnosis: Bipolar Disorder  Medication Management: Evaluate patient's response, side effects, and tolerance of medication regimen.  Therapeutic Interventions: 1 to 1 sessions, Unit Group sessions and Medication administration.  Evaluation of Outcomes: Not Met  Physician Treatment Plan for Secondary Diagnosis: Active Problems:   Severe recurrent major depression without psychotic features ([place])   Medication Management: Evaluate patient's response, side effects, and tolerance of medication regimen.  Therapeutic Interventions: 1 to 1 sessions, Unit Group sessions and Medication administration.  Evaluation of Outcomes: Not Met   RN Treatment Plan for Primary Diagnosis: Bipolar Disorder  Long Term Goal(s): Knowledge of disease and  therapeutic regimen to maintain health will improve  Short Term Goals: Ability to remain free from injury will improve, Ability to verbalize feelings will improve and Ability to disclose and discuss suicidal ideas  Medication Management: RN will administer medications as ordered by provider, will assess and evaluate patient's response and provide education to patient for prescribed medication. RN will report any adverse and/or side effects to prescribing provider.  Therapeutic Interventions: 1 on 1 counseling sessions, Psychoeducation, Medication administration, Evaluate responses to treatment, Monitor vital signs and CBGs as ordered, Perform/monitor CIWA, COWS, AIMS and Fall Risk screenings as ordered, Perform wound care treatments as ordered.  Evaluation of Outcomes: Not Met   LCSW Treatment Plan for Primary Diagnosis: Bipolar Disorder  Long Term Goal(s): Safe transition to appropriate next level of care at discharge, Engage patient in therapeutic group addressing interpersonal concerns.  Short Term Goals: Engage patient in aftercare planning with referrals and resources, Facilitate patient progression through stages of change regarding substance use diagnoses and concerns and Identify triggers associated with mental health/substance abuse issues  Therapeutic Interventions: Assess for all discharge needs, 1 to 1 time with Social worker, Explore available resources and support systems, Assess for adequacy in community support network, Educate family and significant other(s) on suicide prevention, Complete Psychosocial Assessment, Interpersonal group therapy.  Evaluation of Outcomes: Not Met   Progress in Treatment: Attending groups: No. New to unit. Continuing to assess.  Participating in groups: No. Taking medication as prescribed: Yes. Toleration medication: Yes. Family/Significant other contact made: No, will contact:  family member if pt consents to collateral contact.  Patient  understands diagnosis: Yes. Discussing patient identified problems/goals with staff: Yes. Medical problems stabilized or resolved: Yes. Denies suicidal/homicidal ideation: Yes. Issues/concerns per patient self-inventory: No. Other: n/a  New problem(s) identified: No, Describe:  n/a  New Short Term/Long Term Goal(s): detox, medication management for mood stabilization; elimination of SI thoughts; development of comprehensive mental wellness/sobriety plan.   Patient Goals:  "I need better coping skills and I have to learn how to live sober."   Discharge Plan or Barriers: CSW assessing--pt no longer eligible for [facility]. Pt requesting [facility] referral. MHAG pamphlet, Mobile Crisis information, and AA/NA information provided to patient for additional community support and resources.   Reason for Continuation of Hospitalization: Anxiety Depression Medication stabilization Suicidal ideation Withdrawal symptoms  Estimated Length of Stay: Monday, 04/15/18  Attendees: Patient: 04/10/2018 9:00 AM  Physician: Dr. Nancy Fetter MD 04/10/2018 9:00 AM  Nursing: Evelena Leyden RN 04/10/2018 9:00 AM  RN Care Manager:x 04/10/2018 9:00 AM  Social Worker: Janice Norrie LCSW 04/10/2018 9:00 AM  Recreational Therapist: x 04/10/2018 9:00 AM  Other: Lindell Spar NP 04/10/2018 9:00 AM  Other:  04/10/2018 9:00 AM  Other: 04/10/2018 9:00 AM    Scribe for Treatment Team: [name], LCSW 04/10/2018 9:00  AM

## 2018-04-10 NOTE — Progress Notes (Addendum)
Pt observed in the dayroom, seen eating a snack. Per request, Pt was also given a tray. Pt attended wrap up group this evening. No new c/o's. Pt denies SI/HI/AVH/Pain at this time.Pt states she hopes to get into [facility]. Pt appears animated/anxious in affect and mood. PRN vistaril and nicorette gum requested and given. Support and encouragement provided. Will continue with POC.

## 2018-04-10 NOTE — BHH Group Notes (Signed)
Adult Nursing Psychoeducational Group Note  Date: 04/10/2018  Time: 3:00 PM  Group Topic/Focus: Relaxation Techniques Facilitator: Marzetta Merino RN  Participation Level:  Did Not Attend  Additional Comments:  Patient invited but declined to attend group.  Sarah Cervantes 04/10/2018 3:30 PM

## 2018-04-10 NOTE — BHH Suicide Risk Assessment (Addendum)
[place] INPATIENT:  Family/Significant Other Suicide Prevention Education  Suicide Prevention Education:  Contact Attempts: Abbe Amsterdam (pt's aunt) [phone] has been identified by the patient as the family member/significant other with whom the patient will be residing, and identified as the person(s) who will aid the patient in the event of a mental health crisis.  With written consent from the patient, two attempts were made to provide suicide prevention education, prior to and/or following the patient's discharge.  We were unsuccessful in providing suicide prevention education.  A suicide education pamphlet was given to the patient to share with family/significant other.  Date and time of first attempt: 11:37AM on 04/10/18 Date and time of second attempt: 1:02PM on 04/10/18 (Voicemail left requesting call back at her earliest convenience).   [name] LCSW 04/10/2018, 1:03 PM   SPE and aftercare reviewed with pt's aunt. She reports that she is emotional support person for [facility] and is relieved to hear that she is safe in the hospital. Judeen Hammans is supportive of plan to try to get pt admitted to [facility] for further treatment.  Avanthika Dehnert S. Ouida Sills, MSW, LCSW Clinical Social Worker 04/10/2018 1:35 PM

## 2018-04-10 NOTE — BHH Group Notes (Signed)
[facility] Mental Health Association Group Therapy 04/10/2018 1:15pm  Type of Therapy: Mental Health Association Presentation  Participation Level: Pt invited. DID NOT ATTEND. Pt chose to remain in bed.   [name], LCSW 04/10/2018 9:44 AM

## 2018-04-10 NOTE — Progress Notes (Addendum)
Patient ID: Sarah Cervantes, female   DOB: 03-Dec-1980, 37 y.o.   MRN: [id number]  Nursing Progress Note [id number]  Data: Patient presents with sad/sullen mood and flat/depressed affect. Patient reports she still feels tired this morning from her early morning admission. Patient states she feels bad for being readmitted and states, "at least no one has asked me why I'm back so fast". Patient compliant with scheduled medications. Patient reports she plans to rest in bed and has not been attending groups this morning. Patient currently denies SI/HI but still endorses auditory hallucinations. Patient denies withdrawal symptoms this morning except for nausea.  Patient completed self-inventory sheet and rates depression, hopelessness, and anxiety 5,5,5 respectively. Patient rates their sleep and appetite as good/fair respectively. Patient states goal for today is to "focus on sobriety".  Action: Patient is educated about and provided medication per provider's orders. Patient safety maintained with q15 min safety checks and frequent rounding. Low fall risk precautions in place. Emotional support given. 1:1 interaction and active listening provided. Patient encouraged to attend meals, groups, and work on treatment plan and goals. Labs, vital signs and patient behavior monitored throughout shift.   Response: Patient remains safe on the unit at this time and agrees to come to staff with any issues/concerns. Will continue to support and monitor.
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
## 2018-04-11 ENCOUNTER — Inpatient Hospital Stay (HOSPITAL_COMMUNITY)
Admission: AD | Admit: 2018-04-11 | Discharge: 2018-04-11 | Disposition: A | Discharge status: Home / Self Care | Payer: Federal, State, Local not specified - Other | Source: Intra-hospital | Attending: Psychiatry | Admitting: Psychiatry

## 2018-04-11 MED ORDER — ARIPIPRAZOLE 5 MG PO TABS
5.0000 mg | ORAL_TABLET | Freq: Every day | ORAL | Status: DC
  Administered 2018-04-11 – 2018-04-23 (×13): 5 mg via ORAL
  Filled 2018-04-11 (×9): qty 1
  Filled 2018-04-11 (×2): qty 7
  Filled 2018-04-11 (×7): qty 1

## 2018-04-11 MED ORDER — MELATONIN 5 MG PO TABS
5.0000 mg | ORAL_TABLET | Freq: Every day | ORAL | Status: DC
  Administered 2018-04-11 – 2018-04-15 (×5): 5 mg via ORAL
  Filled 2018-04-11 (×7): qty 1

## 2018-04-11 MED ORDER — IBUPROFEN 600 MG PO TABS
600.0000 mg | ORAL_TABLET | Freq: Four times a day (QID) | ORAL | Status: DC | PRN
  Administered 2018-04-11 – 2018-04-22 (×14): 600 mg via ORAL
  Filled 2018-04-11 (×14): qty 1

## 2018-04-11 NOTE — Progress Notes (Signed)
Pt did not particapate in wrap up group.

## 2018-04-11 NOTE — Progress Notes (Signed)
Pt insist and demand I remove  legs from the wheelchair. Staff advised pt the phone will be cut off for wrap up group. Pt start to yelling, screaming, and use profanity to me. Pt said should be able to talk on the phone as long as she need to talk. Pt said it is not her fault wrap up group did not start on time. I advised the pt the reason wrap up  group did not start on time because she was demanding the nurse she wanted to take a bath, I advised the pt she is consider a high fall risk and she can not take a bath alone without a staff member being present. she wanted and demand her clothing that was already in her room. She was demanding she was told by the doctor she could have her medicine early.RN notified

## 2018-04-11 NOTE — Plan of Care (Signed)
  Problem: Coping: Goal: Ability to interact with others will improve Outcome: Progressing Goal: Ability to use eye contact when communicating with others will improve Outcome: Progressing   D: Pt alert and oriented on the unit. Pt engaging with RN staff and other pts, and denies SI/HI, A/VH. Per MD order, pt was transported to radiology at [facility] for x-ray. Pt reported that her knee was less painful than earlier today. Pt is using a wheelchair for transport on the unit, and has a standard walker in her room to assist with ambulation. Pt has been re-assessed as a high fall risk. A: Education, support and encouragement provided, q15 minute safety checks remain in effect. Medications administered per MD orders. R: No reactions/side effects to medicine noted. Pt verbally contracts for safety. Pt remains safe on the unit.

## 2018-04-11 NOTE — Progress Notes (Addendum)
Pt observed in the dayroom, seen eating a snack.Pt did not attended wrap up group this evening. Pt c/o of increased appetite. Pt states she hopes to talk to provider tomorrow regarding increasing topamax.Pt denies SI/HI/AVH at this time. Rates pain 8/10; Left knee.Pt appears anxious/irritable/agitated in affect and mood. PRN vistaril and tylenol requested and given. Pt is preoccupied with various somatic c/o's.High falls risk protocol maintained. High falls risk reduction methods discussed with Pt. Pt verbalized understanding. Pt was move to room closer to NS. Wheelchair and walker provided for mobility.Support and encouragement provided.Will continue with POC.

## 2018-04-11 NOTE — BHH Group Notes (Signed)
LCSW Group Therapy Note   04/11/2018 1:15pm   Type of Therapy and Topic:  Group Therapy:  Overcoming Obstacles   Participation Level:  Active   Description of Group:    In this group patients will be encouraged to explore what they see as obstacles to their own wellness and recovery. They will be guided to discuss their thoughts, feelings, and behaviors related to these obstacles. The group will process together ways to cope with barriers, with attention given to specific choices patients can make. Each patient will be challenged to identify changes they are motivated to make in order to overcome their obstacles. This group will be process-oriented, with patients participating in exploration of their own experiences as well as giving and receiving support and challenge from other group members.   Therapeutic Goals: 1. Patient will identify personal and current obstacles as they relate to admission. 2. Patient will identify barriers that currently interfere with their wellness or overcoming obstacles.  3. Patient will identify feelings, thought process and behaviors related to these barriers. 4. Patient will identify two changes they are willing to make to overcome these obstacles:      Summary of Patient Progress   Sarah Cervantes was attentive and engaged during today's processing group. She shared that her biggest obstacle is figuring out how to remain sober and live "in the world." [name] shared that she has been in prison, in foster care, or in treatment centers for most of her life, and has little experience "making it on my own." [name] struggles with cravings and reports limited supports. "I have to go directly in to treatment somewhere or I won't make it." Sarah Cervantes continues to show progress in the group setting with improving insight.    Therapeutic Modalities:   Cognitive Behavioral Therapy Solution Focused Therapy Motivational Interviewing Relapse Prevention Therapy  [name],  LCSW 04/11/2018 11:04 AM

## 2018-04-11 NOTE — BHH Group Notes (Signed)
Pt was invited, but did not attend orientation / goals group.  

## 2018-04-11 NOTE — Progress Notes (Signed)
Pt was found sitting on the floor in her room from an unwitnessed fall. Pt stated that she got out of bed too fast and twisted her left knee, then fell. Pt stated, "I just twisted my knee and fell, I didn't hit my head or anything. "Pt was assessed with no apparent injury. MD was notified and wrote an order for pt to be transported to radiology at [facility] hospital for an x-ray. Pt was given PRN tylenol and provided an ice pack for her knee. Pt will continue to be monitored.
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
## 2018-04-11 NOTE — Progress Notes (Signed)
Pt sitting in the dayroom. Staff advised the pt dayroom closed. Pt said no this dayroom stays open until 11:30 due to sports game. Pt demand to talk to charge nurse. Charge nurse talked to the pt.

## 2018-04-11 NOTE — Progress Notes (Signed)
Pt has been declined by [facility] due to medical issues including asthma and BMI. Admissions reported that due to pt's medical issues, they believe that it will not be safe for her to participate in the physical demands of the program.  Pt is on the wait list for [facility] and is not elgibiel for [facility] due to leaving the program early last week after 2 days. (She will have a 90 day probationary period before being eligible for [facility] again).   Adriona Kaney S. Ouida Sills, MSW, LCSW Clinical Social Worker 04/11/2018 11:01 AM

## 2018-04-12 MED ORDER — TRAZODONE HCL 100 MG PO TABS
100.0000 mg | ORAL_TABLET | Freq: Every evening | ORAL | Status: DC | PRN
  Administered 2018-04-12 – 2018-04-23 (×18): 100 mg via ORAL
  Filled 2018-04-12: qty 1
  Filled 2018-04-12: qty 14
  Filled 2018-04-12 (×2): qty 1
  Filled 2018-04-12: qty 14
  Filled 2018-04-12 (×25): qty 1
  Filled 2018-04-12 (×2): qty 14

## 2018-04-12 MED ORDER — TOPIRAMATE 100 MG PO TABS
100.0000 mg | ORAL_TABLET | Freq: Every day | ORAL | Status: DC
  Administered 2018-04-12 – 2018-04-22 (×11): 100 mg via ORAL
  Filled 2018-04-12 (×4): qty 1
  Filled 2018-04-12 (×2): qty 7
  Filled 2018-04-12 (×8): qty 1

## 2018-04-12 MED ORDER — NICOTINE POLACRILEX 2 MG MT GUM
2.0000 mg | CHEWING_GUM | OROMUCOSAL | Status: DC | PRN
  Administered 2018-04-13 – 2018-04-23 (×26): 2 mg via ORAL
  Filled 2018-04-12: qty 1
  Filled 2018-04-12: qty 10
  Filled 2018-04-12 (×9): qty 1

## 2018-04-12 MED ORDER — BISACODYL 5 MG PO TBEC
5.0000 mg | DELAYED_RELEASE_TABLET | Freq: Every day | ORAL | Status: DC | PRN
  Administered 2018-04-12 – 2018-04-18 (×3): 5 mg via ORAL
  Filled 2018-04-12 (×3): qty 1

## 2018-04-12 MED ORDER — SENNOSIDES-DOCUSATE SODIUM 8.6-50 MG PO TABS
1.0000 | ORAL_TABLET | Freq: Once | ORAL | Status: DC
  Filled 2018-04-12: qty 1

## 2018-04-12 MED ORDER — MAGNESIUM CITRATE PO SOLN
1.0000 | Freq: Once | ORAL | Status: AC
  Administered 2018-04-12: 1 via ORAL
  Filled 2018-04-12: qty 296

## 2018-04-12 NOTE — Plan of Care (Signed)
  Problem: Activity: Goal: Will identify at least one activity in which they can participate Outcome: Progressing

## 2018-04-12 NOTE — Progress Notes (Signed)
Recreation Therapy Notes  Date: 11.15.19 Time: 0930 Location: 300 Hall Dayroom  Group Topic: Stress Management  Goal Area(s) Addresses:  Patient will verbalize importance of using healthy stress management.  Patient will identify positive emotions associated with healthy stress management.   Behavioral Response: Engaged  Intervention: Stress Management  Activity :  Guided Imagery.  LRT introduced the stress management technique of guided imagery.  LRT read a script that allowed patients to envision patients being on the beach enjoying the sounds of the waves.  Patients were to listen and follow along as script was read.  Education:  Stress Management, Discharge Planning.   Education Outcome: Acknowledges edcuation/In group clarification offered/Needs additional education  Clinical Observations/Feedback: Pt attended and participated in group.     Victorino Sparrow, LRT/CTRS    Ria Comment, Marieta Markov A 04/12/2018 11:04 AM

## 2018-04-12 NOTE — Progress Notes (Signed)
Pt states she could not sleep last night. Pt states melatonin is not effective. Will pass on to day team.

## 2018-04-12 NOTE — [facility] Group Notes (Signed)
LCSW Group Therapy Note  04/12/2018 1:15pm  Type of Therapy and Topic:  Group Therapy: Avoiding Self-Sabotaging and Enabling Behaviors  Participation Level:  Did Not Attend--pt invited. Sleeping in room.   Description of Group:   In this group, patients will learn how to identify obstacles, self-sabotaging and enabling behaviors, as well as: what are they, why do we do them and what needs these behaviors meet. Discuss unhealthy relationships and how to have positive healthy boundaries with those that sabotage and enable. Explore aspects of self-sabotage and enabling in yourself and how to limit these self-destructive behaviors in everyday life.   Therapeutic Goals: 1. Patient will identify one obstacle that relates to self-sabotage and enabling behaviors 2. Patient will identify one personal self-sabotaging or enabling behavior they did prior to admission 3. Patient will state a plan to change the above identified behavior 4. Patient will demonstrate ability to communicate their needs through discussion and/or role play.   Summary of Patient Progress:   x  Therapeutic Modalities:   Cognitive Behavioral Therapy Person-Centered Therapy Motivational Interviewing   [name], [place] 04/12/2018 10:51 AM

## 2018-04-12 NOTE — Progress Notes (Signed)
Pt currently using tub room this a.m. to take a bath. Will monitor.
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
## 2018-04-13 DIAGNOSIS — K5900 Constipation, unspecified: Secondary | ICD-10-CM | POA: Diagnosis present

## 2018-04-13 DIAGNOSIS — N760 Acute vaginitis: Secondary | ICD-10-CM

## 2018-04-13 DIAGNOSIS — B9689 Other specified bacterial agents as the cause of diseases classified elsewhere: Secondary | ICD-10-CM | POA: Diagnosis present

## 2018-04-13 DIAGNOSIS — F10239 Alcohol dependence with withdrawal, unspecified: Secondary | ICD-10-CM

## 2018-04-13 MED ORDER — LINACLOTIDE 290 MCG PO CAPS
290.0000 ug | ORAL_CAPSULE | Freq: Once | ORAL | Status: AC
  Administered 2018-04-13: 290 ug via ORAL
  Filled 2018-04-13: qty 1

## 2018-04-13 MED ORDER — MAGNESIUM CITRATE PO SOLN
1.0000 | Freq: Once | ORAL | Status: AC
  Administered 2018-04-13: 1 via ORAL
  Filled 2018-04-13: qty 296

## 2018-04-13 MED ORDER — METRONIDAZOLE 500 MG PO TABS
500.0000 mg | ORAL_TABLET | Freq: Two times a day (BID) | ORAL | Status: AC
  Administered 2018-04-13 – 2018-04-17 (×10): 500 mg via ORAL
  Filled 2018-04-13 (×12): qty 1
  Filled 2018-04-13: qty 2

## 2018-04-13 NOTE — Progress Notes (Signed)
D. Pt is calm and cooperative- friendly during interactions -observed smiling,  interacting well with peers in the milieu. Per pt's self inventory, pt rates her depression, hopelessness and anxiety all 0's. Pt writes that her most important goal today is "no attitude" and writes that she will "think before act" in order her to meet that goal. Pt currently denies SI/HI and AV hallucinations.A. Labs and vitals monitored. Pt compliant with medications. Pt supported emotionally and encouraged to express concerns and ask questions.   R. Pt remains safe with 15 minute checks. Will continue POC.
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
## 2018-04-13 NOTE — [facility] Group Notes (Signed)
[facility] Group Notes:  (Nursing)  Date:  04/13/2018  Time:130 PM Type of Therapy:  Nurse Education  Participation Level:  Active  Participation Quality:  Appropriate  Affect:  Appropriate  Cognitive:  Appropriate  Insight:  Appropriate  Engagement in Group:  Engaged  Modes of Intervention:  Discussion and Education  Summary of Progress/Problems: Nurse led group played a noncompetitive learning/communication board game that fosters listening skills as well as self expression.  Waymond Cera 04/13/2018, 4:38 PM

## 2018-04-13 NOTE — Progress Notes (Signed)
D: Pt denies SI/HI/AV hallucinations. Pt is pleasant and cooperative. Pt goal for today is to "not get an attitude." A: Pt was offered support and encouragement. Pt was given scheduled medications. Pt was encourage to attend groups. Q 15 minute checks were done for safety.  R:Pt attends groups and interacts well with peers and staff. Pt is taking medication. Pt has no complaints.Pt receptive to treatment and safety maintained on unit.

## 2018-04-13 NOTE — Plan of Care (Signed)
  Problem: Coping: Goal: Ability to interact with others will improve Outcome: Progressing   Problem: Safety: Goal: Ability to remain free from injury will improve Outcome: Progressing   Problem: Coping: Goal: Ability to demonstrate self-control will improve Outcome: Progressing

## 2018-04-13 NOTE — Progress Notes (Addendum)
[facility] MD Progress Note  04/13/2018 10:53 AM Sarah Cervantes  MRN:  [id number]   Subjective: Patient reports today that she actually slept very well last night.  She states that she is feeling much better with the medications that she is on.  She denies any suicidal or homicidal ideations and denies any hallucinations.  She does however have a couple of complaints today.  She reports that she is still constipated and the magnesium citrate did not help her at all.  She states that she has had 2 doses and she also took 2 doses of Dulcolax.  Patient is also reporting a "fishy odor", from her vaginal area and she reports that she has a history of having bacterial vaginosis and she is usually prescribed Flagyl to treat this.  Objective: Patient's chart and findings reviewed and discussed with treatment team.  Patient presents in the day room and is pleasant and cooperative.  Patient has been seen interacting with peers and staff appropriately.  Patient does appear to be improved since the last time that I spoke with her and her affect and her mood have both improved greatly.  Principal Problem: Polysubstance dependence including opioid drug with daily use ([place]) Diagnosis:   Patient Active Problem List   Diagnosis Date Noted  . Constipation [K59.00] 04/13/2018  . Bacterial vaginosis [N76.0, B96.89] 04/13/2018  . Bipolar I disorder, current or most recent episode depressed, with psychotic features ([place]) [F31.5] 03/26/2018  . Bacteremia due to Gram-negative bacteria [R78.81] 03/25/2018  . Cocaine abuse with cocaine-induced mood disorder ([place]) [F14.14] 03/24/2018  . PTSD (post-traumatic stress disorder) [F43.10] 03/24/2018  . Sepsis, Gram negative ([place]) [A41.50] 03/23/2018  . Alcohol abuse with intoxication ([place]) [F10.129] 03/23/2018  . Polysubstance dependence including opioid drug with daily use ([place]) [F11.20, F19.20] 03/23/2018   Total Time spent with patient: 30 minutes  Past Psychiatric History:  See H&P  Past Medical History:  Past Medical History:  Diagnosis Date  . Cocaine abuse, daily use ([place]) 03/23/2018  . ETOH abuse 03/23/2018  . Heroin abuse ([place]) 03/23/2018   History reviewed. No pertinent surgical history. Family History: History reviewed. No pertinent family history. Family Psychiatric  History: See H&P Social History:  Social History   Substance and Sexual Activity  Alcohol Use Yes  . Alcohol/week: 12.0 - 15.0 standard drinks  . Types: 12 - 15 Cans of beer per week   Comment: drinking daily for the past week     Social History   Substance and Sexual Activity  Drug Use Yes  . Types: Cocaine   Comment: Heroin and Cocain on 2018/03/23. Pt uses every night    Social History   Socioeconomic History  . Marital status: Single    Spouse name: Not on file  . Number of children: Not on file  . Years of education: Not on file  . Highest education level: Not on file  Occupational History  . Not on file  Social Needs  . Financial resource strain: Not on file  . Food insecurity:    Worry: Not on file    Inability: Not on file  . Transportation needs:    Medical: Not on file    Non-medical: Not on file  Tobacco Use  . Smoking status: Current Every Day Smoker    Packs/day: 0.50    Years: 30.00    Pack years: 15.00  . Smokeless tobacco: Never Used  Substance and Sexual Activity  . Alcohol use: Yes    Alcohol/week: 12.0 -  15.0 standard drinks    Types: 12 - 15 Cans of beer per week    Comment: drinking daily for the past week  . Drug use: Yes    Types: Cocaine    Comment: Heroin and Cocain on 2018/03/23. Pt uses every night  . Sexual activity: Yes    Birth control/protection: None  Lifestyle  . Physical activity:    Days per week: Not on file    Minutes per session: Not on file  . Stress: Not on file  Relationships  . Social connections:    Talks on phone: Not on file    Gets together: Not on file    Attends religious service: Not on file     Active member of club or organization: Not on file    Attends meetings of clubs or organizations: Not on file    Relationship status: Not on file  Other Topics Concern  . Not on file  Social History Narrative  . Not on file   Additional Social History:    Pain Medications: See med list Prescriptions: See med list Over the Counter: See med list History of alcohol / drug use?: Yes Longest period of sobriety (when/how long): "3 weeks" Negative Consequences of Use: Personal relationships, Financial, Work / School Name of Substance 1: cocaine 1 - Age of First Use: teens 1 - Amount (size/oz): varies 1 - Frequency: several times a week 1 - Duration: ongoing 1 - Last Use / Amount: 04/09/18 Name of Substance 2: heroin 2 - Age of First Use: 20's 2 - Amount (size/oz): varies 2 - Frequency: occasional 2 - Duration: ongoing 2 - Last Use / Amount: 1/4 gm 04/09/18 Name of Substance 3: ETOH 3 - Age of First Use: teens 3 - Amount (size/oz): varies 3 - Frequency: daily for over a week 3 - Duration: ongoing 3 - Last Use / Amount: 04/09/18              Sleep: Good  Appetite:  Good  Current Medications: Current Facility-Administered Medications  Medication Dose Route Frequency Provider Last Rate Last Dose  . acetaminophen (TYLENOL) tablet 650 mg  650 mg Oral Q4H PRN Lindon Romp A, NP   650 mg at 04/12/18 0303  . alum & mag hydroxide-simeth (MAALOX/MYLANTA) 200-200-20 MG/5ML suspension 30 mL  30 mL Oral Q4H PRN Lindon Romp A, NP      . ARIPiprazole (ABILIFY) tablet 5 mg  5 mg Oral Daily Pennelope Bracken, MD   5 mg at 04/13/18 0830  . bisacodyl (DULCOLAX) EC tablet 5 mg  5 mg Oral Daily PRN Pennelope Bracken, MD   5 mg at 04/13/18 0833  . DULoxetine (CYMBALTA) DR capsule 30 mg  30 mg Oral Daily Lindon Romp A, NP   30 mg at 04/13/18 0830  . gabapentin (NEURONTIN) capsule 300 mg  300 mg Oral TID Lindon Romp A, NP   300 mg at 04/13/18 0830  . hydrOXYzine  (ATARAX/VISTARIL) tablet 50 mg  50 mg Oral Q6H PRN Lindon Romp A, NP   50 mg at 04/11/18 2106  . ibuprofen (ADVIL,MOTRIN) tablet 600 mg  600 mg Oral Q6H PRN Pennelope Bracken, MD   600 mg at 04/12/18 1528  . linaclotide (LINZESS) capsule 290 mcg  290 mcg Oral Once Money, Darnelle Maffucci B, FNP      . magnesium hydroxide (MILK OF MAGNESIA) suspension 30 mL  30 mL Oral Daily PRN Rozetta Nunnery, NP      .  Melatonin TABS 5 mg  5 mg Oral QHS Pennelope Bracken, MD   5 mg at 04/12/18 2100  . metroNIDAZOLE (FLAGYL) tablet 500 mg  500 mg Oral Q12H Money, Lowry Ram, FNP      . mupirocin cream (BACTROBAN) 2 %   Topical BID Lindon Romp A, NP      . nicotine polacrilex (NICORETTE) gum 2 mg  2 mg Oral PRN Ewin Rehberg, Myer Peer, MD      . ondansetron (ZOFRAN) tablet 4 mg  4 mg Oral Q8H PRN Lindon Romp A, NP   4 mg at 04/10/18 1156  . prazosin (MINIPRESS) capsule 1 mg  1 mg Oral QHS Lindon Romp A, NP   1 mg at 04/12/18 2157  . topiramate (TOPAMAX) tablet 100 mg  100 mg Oral QHS Pennelope Bracken, MD   100 mg at 04/12/18 2158  . traZODone (DESYREL) tablet 100 mg  100 mg Oral QHS,MR X 1 Pennelope Bracken, MD   100 mg at 04/12/18 2158    Lab Results: No results found for this or any previous visit (from the past 48 hour(s)).  Blood Alcohol level:  Lab Results  Component Value Date   ETH <10 04/09/2018   ETH <10 51/06/5850    Metabolic Disorder Labs: Lab Results  Component Value Date   HGBA1C 5.6 03/24/2018   MPG 114.02 03/24/2018   No results found for: PROLACTIN No results found for: CHOL, TRIG, HDL, CHOLHDL, VLDL, LDLCALC  Physical Findings: AIMS: Facial and Oral Movements Muscles of Facial Expression: None, normal Lips and Perioral Area: None, normal Jaw: None, normal Tongue: None, normal,Extremity Movements Upper (arms, wrists, hands, fingers): None, normal Lower (legs, knees, ankles, toes): None, normal, Trunk Movements Neck, shoulders, hips: None, normal, Overall  Severity Severity of abnormal movements (highest score from questions above): None, normal Incapacitation due to abnormal movements: None, normal Patient's awareness of abnormal movements (rate only patient's report): No Awareness, Dental Status Current problems with teeth and/or dentures?: No Does patient usually wear dentures?: No  CIWA:  CIWA-Ar Total: 0 COWS:     Musculoskeletal: Strength & Muscle Tone: within normal limits Gait & Station: normal Patient leans: N/A  Psychiatric Specialty Exam: Physical Exam  ROS  Blood pressure 138/89, pulse 90, temperature (!) 97.5 F (36.4 C), temperature source Oral, resp. rate 20, height 5\' 4"  (1.626 m), weight 135.2 kg, last menstrual period 03/23/2018, SpO2 97 %.Body mass index is 51.15 kg/m.  General Appearance: Casual  Eye Contact:  Good  Speech:  Clear and Coherent and Normal Rate  Volume:  Normal  Mood:  Euthymic  Affect:  Congruent  Thought Process:  Coherent and Descriptions of Associations: Intact  Orientation:  Full (Time, Place, and Person)  Thought Content:  WDL  Suicidal Thoughts:  No  Homicidal Thoughts:  No  Memory:  Immediate;   Good Recent;   Good Remote;   Good  Judgement:  Fair  Insight:  Fair  Psychomotor Activity:  Normal  Concentration:  Concentration: Good and Attention Span: Good  Recall:  Good  Fund of Knowledge:  Good  Language:  Good  Akathisia:  No  Handed:  Right  AIMS (if indicated):     Assets:  Communication Skills Desire for Improvement Leisure Time Resilience  ADL's:  Intact  Cognition:  WNL  Sleep:  Number of Hours: 5.5   Problems addressed Bipolar 1 disorder Constipation Bacterial vaginosis  Treatment Plan Summary: Daily contact with patient to assess and evaluate symptoms and  progress in treatment, Medication management and Plan is to: Continue Abilify 5 mg p.o. daily for mood stability Continue Cymbalta 30 mg p.o. daily for mood stability Continue gabapentin 300 mg p.o. 3 times  daily for withdrawal symptoms and mood stability Continue Vistaril 50 mg p.o. every 6 hours as needed for anxiety Start Flagyl 500 mg p.o. every 12 hours for bacterial vaginosis Continue prazosin 1 mg p.o. nightly for PTSD nightmares Continue Topamax 100 mg p.o. nightly for mood stability and migraine prophylaxis Continue trazodone 100 mg p.o. nightly as needed for insomnia Continue melatonin 5 mg p.o. nightly for sleep 1 time dose Linzess 290 mcg for constipation Encourage group therapy participation  Lewis Shock, FNP 04/13/2018, 10:53 AM   .Agree with NP Progress Note

## 2018-04-14 ENCOUNTER — Inpatient Hospital Stay (HOSPITAL_COMMUNITY): Payer: Federal, State, Local not specified - Other

## 2018-04-14 ENCOUNTER — Encounter (HOSPITAL_COMMUNITY): Payer: Self-pay

## 2018-04-14 ENCOUNTER — Other Ambulatory Visit: Payer: Self-pay

## 2018-04-14 LAB — URINALYSIS, ROUTINE W REFLEX MICROSCOPIC
BACTERIA UA: NONE SEEN
Bilirubin Urine: NEGATIVE
Glucose, UA: NEGATIVE mg/dL
Ketones, ur: NEGATIVE mg/dL
Nitrite: NEGATIVE
Protein, ur: NEGATIVE mg/dL
Specific Gravity, Urine: 1.016 (ref 1.005–1.030)
pH: 7 (ref 5.0–8.0)

## 2018-04-14 LAB — COMPREHENSIVE METABOLIC PANEL
ALT: 20 U/L (ref 0–44)
AST: 22 U/L (ref 15–41)
Albumin: 3.3 g/dL — ABNORMAL LOW (ref 3.5–5.0)
Alkaline Phosphatase: 53 U/L (ref 38–126)
Anion gap: 6 (ref 5–15)
BUN: 15 mg/dL (ref 6–20)
CO2: 25 mmol/L (ref 22–32)
CREATININE: 0.96 mg/dL (ref 0.44–1.00)
Calcium: 8.4 mg/dL — ABNORMAL LOW (ref 8.9–10.3)
Chloride: 107 mmol/L (ref 98–111)
GFR calc Af Amer: >60 mL/min (ref >60)
GFR calc non Af Amer: >60 mL/min (ref >60)
Glucose, Bld: 98 mg/dL (ref 70–99)
Potassium: 4.4 mmol/L (ref 3.5–5.1)
Sodium: 138 mmol/L (ref 135–145)
Total Bilirubin: 0.8 mg/dL (ref 0.3–1.2)
Total Protein: 6.4 g/dL — ABNORMAL LOW (ref 6.5–8.1)

## 2018-04-14 LAB — CBC WITH DIFFERENTIAL/PLATELET
ABS IMMATURE GRANULOCYTES: 0.06 10*3/uL (ref 0.00–0.07)
BASOS PCT: 0 %
Basophils Absolute: 0 10*3/uL (ref 0.0–0.1)
Eosinophils Absolute: 0.3 10*3/uL (ref 0.0–0.5)
Eosinophils Relative: 2 %
HEMATOCRIT: 37.6 % (ref 36.0–46.0)
HEMOGLOBIN: 11.9 g/dL — AB (ref 12.0–15.0)
IMMATURE GRANULOCYTES: 1 %
LYMPHS ABS: 1.9 10*3/uL (ref 0.7–4.0)
LYMPHS PCT: 17 %
MCH: 30.1 pg (ref 26.0–34.0)
MCHC: 31.6 g/dL (ref 30.0–36.0)
MCV: 94.9 fL (ref 80.0–100.0)
Monocytes Absolute: 1.2 10*3/uL — ABNORMAL HIGH (ref 0.1–1.0)
Monocytes Relative: 11 %
NEUTROS ABS: 7.8 10*3/uL — AB (ref 1.7–7.7)
NEUTROS PCT: 69 %
PLATELETS: 371 10*3/uL (ref 150–400)
RBC: 3.96 MIL/uL (ref 3.87–5.11)
RDW: 15.8 % — ABNORMAL HIGH (ref 11.5–15.5)
WBC: 11.3 10*3/uL — ABNORMAL HIGH (ref 4.0–10.5)
nRBC: 0 % (ref 0.0–0.2)

## 2018-04-14 LAB — LIPASE, BLOOD: LIPASE: 26 U/L (ref 11–51)

## 2018-04-14 LAB — I-STAT CG4 LACTIC ACID, ED: LACTIC ACID, VENOUS: 0.96 mmol/L (ref 0.5–1.9)

## 2018-04-14 MED ORDER — DOXYCYCLINE HYCLATE 100 MG PO TABS
100.0000 mg | ORAL_TABLET | Freq: Two times a day (BID) | ORAL | Status: AC
  Administered 2018-04-14 – 2018-04-21 (×14): 100 mg via ORAL
  Filled 2018-04-14 (×16): qty 1

## 2018-04-14 MED ORDER — DOCUSATE SODIUM 100 MG PO CAPS
100.0000 mg | ORAL_CAPSULE | Freq: Two times a day (BID) | ORAL | Status: DC
  Administered 2018-04-14 – 2018-04-23 (×19): 100 mg via ORAL
  Filled 2018-04-14 (×23): qty 1

## 2018-04-14 MED ORDER — FLEET ENEMA 7-19 GM/118ML RE ENEM
1.0000 | ENEMA | Freq: Once | RECTAL | Status: AC
  Administered 2018-04-14: 1 via RECTAL
  Filled 2018-04-14: qty 1

## 2018-04-14 MED ORDER — IPRATROPIUM-ALBUTEROL 0.5-2.5 (3) MG/3ML IN SOLN
3.0000 mL | Freq: Once | RESPIRATORY_TRACT | Status: AC
  Administered 2018-04-14: 3 mL via RESPIRATORY_TRACT
  Filled 2018-04-14: qty 3

## 2018-04-14 MED ORDER — SODIUM CHLORIDE (PF) 0.9 % IJ SOLN
INTRAMUSCULAR | Status: AC
  Filled 2018-04-14: qty 50

## 2018-04-14 MED ORDER — IOPAMIDOL (ISOVUE-300) INJECTION 61%
100.0000 mL | Freq: Once | INTRAVENOUS | Status: AC | PRN
  Administered 2018-04-14: 100 mL via INTRAVENOUS

## 2018-04-14 MED ORDER — LINACLOTIDE 290 MCG PO CAPS
290.0000 ug | ORAL_CAPSULE | Freq: Once | ORAL | Status: AC
  Administered 2018-04-15: 290 ug via ORAL
  Filled 2018-04-14 (×2): qty 1

## 2018-04-14 MED ORDER — ALBUTEROL SULFATE HFA 108 (90 BASE) MCG/ACT IN AERS
2.0000 | INHALATION_SPRAY | RESPIRATORY_TRACT | Status: DC | PRN
  Administered 2018-04-14 – 2018-04-21 (×6): 2 via RESPIRATORY_TRACT
  Filled 2018-04-14: qty 6.7

## 2018-04-14 MED ORDER — MENTHOL 3 MG MT LOZG
1.0000 | LOZENGE | OROMUCOSAL | Status: DC | PRN
  Administered 2018-04-14 – 2018-04-15 (×3): 3 mg via ORAL
  Filled 2018-04-14: qty 9

## 2018-04-14 MED ORDER — KETOROLAC TROMETHAMINE 15 MG/ML IJ SOLN
15.0000 mg | Freq: Once | INTRAMUSCULAR | Status: AC
  Administered 2018-04-14: 15 mg via INTRAVENOUS
  Filled 2018-04-14: qty 1

## 2018-04-14 MED ORDER — ONDANSETRON HCL 4 MG/2ML IJ SOLN
4.0000 mg | Freq: Once | INTRAMUSCULAR | Status: AC
  Administered 2018-04-14: 4 mg via INTRAVENOUS
  Filled 2018-04-14: qty 2

## 2018-04-14 MED ORDER — HYDROMORPHONE HCL 1 MG/ML IJ SOLN
1.0000 mg | Freq: Once | INTRAMUSCULAR | Status: AC
  Administered 2018-04-14: 1 mg via INTRAVENOUS
  Filled 2018-04-14: qty 1

## 2018-04-14 MED ORDER — ALBUTEROL SULFATE (2.5 MG/3ML) 0.083% IN NEBU
2.5000 mg | INHALATION_SOLUTION | RESPIRATORY_TRACT | Status: DC | PRN
  Administered 2018-04-14 – 2018-04-15 (×2): 2.5 mg via RESPIRATORY_TRACT
  Filled 2018-04-14 (×2): qty 3

## 2018-04-14 MED ORDER — IOPAMIDOL (ISOVUE-300) INJECTION 61%
INTRAVENOUS | Status: AC
  Filled 2018-04-14: qty 100

## 2018-04-14 MED ORDER — DOXYCYCLINE HYCLATE 100 MG PO TABS
100.0000 mg | ORAL_TABLET | Freq: Once | ORAL | Status: AC
  Administered 2018-04-14: 100 mg via ORAL
  Filled 2018-04-14: qty 1

## 2018-04-14 MED ORDER — DEXAMETHASONE 4 MG PO TABS
10.0000 mg | ORAL_TABLET | Freq: Once | ORAL | Status: AC
  Administered 2018-04-14: 10 mg via ORAL
  Filled 2018-04-14: qty 2

## 2018-04-14 NOTE — Progress Notes (Signed)
Patient attended only the first 15 minutes of the  evening speaker [place] meeting. Pt exited group and returned to her room.

## 2018-04-14 NOTE — Progress Notes (Signed)
D. Pt aroused from sleep for am meds- Pt presented with a productive cough- producing thick yellow-brown blood tinged sputum. Slight bilateral wheezing heard upon auscultation. Pt complaining of SOB. Pt also reports some abdominal pain 8/10, stating that she "still hasn't used the bathroom A. NP notified / prn meds ordered  and administered for SOB  Labs and vitals monitored. Pt compliant with meds. Pt supported emotionally and encouraged to express concerns and ask questions.   R. Pt reports relief from nebulizer treatment. l Pt remains safe with 15 minute checks. Will continue POC.

## 2018-04-14 NOTE — Progress Notes (Addendum)
[facility] MD Progress Note  04/14/2018 10:07 AM Sarah Cervantes  MRN:  [id number]   Subjective: Patient states today that she is feeling bad.  She reports that she is having some difficulty sleeping and there are she feels that she can hear some wheezes when she breathes.  Patient also reports that she is now having some worsening and severe abdominal pain.  She reports that even after she received her Linzess yesterday that she was also given another dose of magnesium citrate last night and she still has not had a bowel movement.  She feels that she needs to have some relief and had asked about getting a enema while she was here.  She denies any suicidal or homicidal ideations and denies any hallucinations.  Objective: Patient's chart and findings reviewed and discussed with treatment team.  Patient presents in the milieu and audible wheezes are heard standing beside the patient.  Lungs are auscultated and wheezes are heard throughout.  Patient then reports that she has a history of asthma and patient is prescribed a ProAir inhaler as well as albuterol nebulizer treatments.  Requested nurse to give patient breathing treatments due to several doses of laxatives of mag citrate and Linzess and Dulcolax and no bowel movement will have patient sent to [name] to be evaluated for potential blockage or impaction.  Patient is in agreement with this plan.  Patient is pleasant, calm, and cooperative.  Patient does appear to be uncomfortable with her abdominal pain as well as with her breathing with wheezes.  Principal Problem: Polysubstance dependence including opioid drug with daily use ([place]) Diagnosis:   Patient Active Problem List   Diagnosis Date Noted  . Constipation [K59.00] 04/13/2018  . Bacterial vaginosis [N76.0, B96.89] 04/13/2018  . Bipolar I disorder, current or most recent episode depressed, with psychotic features ([place]) [F31.5] 03/26/2018  . Bacteremia due to Gram-negative bacteria [R78.81]  03/25/2018  . Cocaine abuse with cocaine-induced mood disorder ([place]) [F14.14] 03/24/2018  . PTSD (post-traumatic stress disorder) [F43.10] 03/24/2018  . Sepsis, Gram negative ([place]) [A41.50] 03/23/2018  . Alcohol abuse with intoxication ([place]) [F10.129] 03/23/2018  . Polysubstance dependence including opioid drug with daily use ([place]) [F11.20, F19.20] 03/23/2018   Total Time spent with patient: 30 minutes  Past Psychiatric History: See H&P  Past Medical History:  Past Medical History:  Diagnosis Date  . Cocaine abuse, daily use ([place]) 03/23/2018  . ETOH abuse 03/23/2018  . Heroin abuse ([place]) 03/23/2018   History reviewed. No pertinent surgical history. Family History: History reviewed. No pertinent family history. Family Psychiatric  History: See H&P Social History:  Social History   Substance and Sexual Activity  Alcohol Use Yes  . Alcohol/week: 12.0 - 15.0 standard drinks  . Types: 12 - 15 Cans of beer per week   Comment: drinking daily for the past week     Social History   Substance and Sexual Activity  Drug Use Yes  . Types: Cocaine   Comment: Heroin and Cocain on 2018/03/23. Pt uses every night    Social History   Socioeconomic History  . Marital status: Single    Spouse name: Not on file  . Number of children: Not on file  . Years of education: Not on file  . Highest education level: Not on file  Occupational History  . Not on file  Social Needs  . Financial resource strain: Not on file  . Food insecurity:    Worry: Not on file    Inability: Not  on file  . Transportation needs:    Medical: Not on file    Non-medical: Not on file  Tobacco Use  . Smoking status: Current Every Day Smoker    Packs/day: 0.50    Years: 30.00    Pack years: 15.00  . Smokeless tobacco: Never Used  Substance and Sexual Activity  . Alcohol use: Yes    Alcohol/week: 12.0 - 15.0 standard drinks    Types: 12 - 15 Cans of beer per week    Comment: drinking daily for the past week   . Drug use: Yes    Types: Cocaine    Comment: Heroin and Cocain on 2018/03/23. Pt uses every night  . Sexual activity: Yes    Birth control/protection: None  Lifestyle  . Physical activity:    Days per week: Not on file    Minutes per session: Not on file  . Stress: Not on file  Relationships  . Social connections:    Talks on phone: Not on file    Gets together: Not on file    Attends religious service: Not on file    Active member of club or organization: Not on file    Attends meetings of clubs or organizations: Not on file    Relationship status: Not on file  Other Topics Concern  . Not on file  Social History Narrative  . Not on file   Additional Social History:    Pain Medications: See med list Prescriptions: See med list Over the Counter: See med list History of alcohol / drug use?: Yes Longest period of sobriety (when/how long): "3 weeks" Negative Consequences of Use: Personal relationships, Financial, Work / School Name of Substance 1: cocaine 1 - Age of First Use: teens 1 - Amount (size/oz): varies 1 - Frequency: several times a week 1 - Duration: ongoing 1 - Last Use / Amount: 04/09/18 Name of Substance 2: heroin 2 - Age of First Use: 20's 2 - Amount (size/oz): varies 2 - Frequency: occasional 2 - Duration: ongoing 2 - Last Use / Amount: 1/4 gm 04/09/18 Name of Substance 3: ETOH 3 - Age of First Use: teens 3 - Amount (size/oz): varies 3 - Frequency: daily for over a week 3 - Duration: ongoing 3 - Last Use / Amount: 04/09/18              Sleep: Good  Appetite:  Good  Current Medications: Current Facility-Administered Medications  Medication Dose Route Frequency Provider Last Rate Last Dose  . acetaminophen (TYLENOL) tablet 650 mg  650 mg Oral Q4H PRN Lindon Romp A, NP   650 mg at 04/13/18 1953  . albuterol (PROVENTIL HFA;VENTOLIN HFA) 108 (90 Base) MCG/ACT inhaler 2 puff  2 puff Inhalation Q4H PRN Money, Lowry Ram, FNP   2 puff at 04/14/18  0918  . albuterol (PROVENTIL) (2.5 MG/3ML) 0.083% nebulizer solution 2.5 mg  2.5 mg Nebulization Q4H PRN Money, Darnelle Maffucci B, FNP   2.5 mg at 04/14/18 1001  . alum & mag hydroxide-simeth (MAALOX/MYLANTA) 200-200-20 MG/5ML suspension 30 mL  30 mL Oral Q4H PRN Lindon Romp A, NP      . ARIPiprazole (ABILIFY) tablet 5 mg  5 mg Oral Daily Pennelope Bracken, MD   5 mg at 04/14/18 0918  . bisacodyl (DULCOLAX) EC tablet 5 mg  5 mg Oral Daily PRN Pennelope Bracken, MD   5 mg at 04/13/18 0833  . DULoxetine (CYMBALTA) DR capsule 30 mg  30 mg Oral Daily Gwenlyn Found,  Rinaldo Ratel, NP   30 mg at 04/14/18 0919  . gabapentin (NEURONTIN) capsule 300 mg  300 mg Oral TID Lindon Romp A, NP   300 mg at 04/14/18 0919  . hydrOXYzine (ATARAX/VISTARIL) tablet 50 mg  50 mg Oral Q6H PRN Lindon Romp A, NP   50 mg at 04/14/18 0020  . ibuprofen (ADVIL,MOTRIN) tablet 600 mg  600 mg Oral Q6H PRN Pennelope Bracken, MD   600 mg at 04/12/18 1528  . magnesium hydroxide (MILK OF MAGNESIA) suspension 30 mL  30 mL Oral Daily PRN Rozetta Nunnery, NP      . Melatonin TABS 5 mg  5 mg Oral QHS Pennelope Bracken, MD   5 mg at 04/13/18 1954  . menthol-cetylpyridinium (CEPACOL) lozenge 3 mg  1 lozenge Oral PRN Lindon Romp A, NP      . metroNIDAZOLE (FLAGYL) tablet 500 mg  500 mg Oral Q12H Money, Lowry Ram, FNP   500 mg at 04/14/18 5361  . mupirocin cream (BACTROBAN) 2 %   Topical BID Lindon Romp A, NP      . nicotine polacrilex (NICORETTE) gum 2 mg  2 mg Oral PRN Isra Lindy, Myer Peer, MD   2 mg at 04/13/18 1722  . ondansetron (ZOFRAN) tablet 4 mg  4 mg Oral Q8H PRN Lindon Romp A, NP   4 mg at 04/10/18 1156  . prazosin (MINIPRESS) capsule 1 mg  1 mg Oral QHS Lindon Romp A, NP   1 mg at 04/13/18 2101  . topiramate (TOPAMAX) tablet 100 mg  100 mg Oral QHS Pennelope Bracken, MD   100 mg at 04/13/18 2101  . traZODone (DESYREL) tablet 100 mg  100 mg Oral QHS,MR X 1 Pennelope Bracken, MD   100 mg at 04/14/18 0019     Lab Results: No results found for this or any previous visit (from the past 48 hour(s)).  Blood Alcohol level:  Lab Results  Component Value Date   ETH <10 04/09/2018   ETH <10 44/31/5400    Metabolic Disorder Labs: Lab Results  Component Value Date   HGBA1C 5.6 03/24/2018   MPG 114.02 03/24/2018   No results found for: PROLACTIN No results found for: CHOL, TRIG, HDL, CHOLHDL, VLDL, LDLCALC  Physical Findings: AIMS: Facial and Oral Movements Muscles of Facial Expression: None, normal Lips and Perioral Area: None, normal Jaw: None, normal Tongue: None, normal,Extremity Movements Upper (arms, wrists, hands, fingers): None, normal Lower (legs, knees, ankles, toes): None, normal, Trunk Movements Neck, shoulders, hips: None, normal, Overall Severity Severity of abnormal movements (highest score from questions above): None, normal Incapacitation due to abnormal movements: None, normal Patient's awareness of abnormal movements (rate only patient's report): No Awareness, Dental Status Current problems with teeth and/or dentures?: No Does patient usually wear dentures?: No  CIWA:  CIWA-Ar Total: 6 COWS:     Musculoskeletal: Strength & Muscle Tone: within normal limits Gait & Station: normal Patient leans: N/A  Psychiatric Specialty Exam: Physical Exam  Nursing note and vitals reviewed. Constitutional: She is oriented to person, place, and time. She appears well-developed and well-nourished.  Cardiovascular: Normal rate.  Respiratory: Effort normal. She has wheezes.  Musculoskeletal: Normal range of motion.  Neurological: She is alert and oriented to person, place, and time.  Skin: Skin is warm.    Review of Systems  Constitutional: Negative.   HENT: Negative.   Eyes: Negative.   Respiratory: Positive for sputum production and wheezing.   Cardiovascular: Negative.  Gastrointestinal: Positive for constipation.  Genitourinary: Negative.   Musculoskeletal:  Negative.   Skin: Negative.   Neurological: Negative.   Endo/Heme/Allergies: Negative.     Blood pressure (!) 158/90, pulse 83, temperature 97.7 F (36.5 C), temperature source Oral, resp. rate 20, height 5\' 4"  (1.626 m), weight 135.2 kg, last menstrual period 03/23/2018, SpO2 97 %.Body mass index is 51.15 kg/m.  General Appearance: Casual  Eye Contact:  Good  Speech:  Clear and Coherent and Normal Rate  Volume:  Normal  Mood:  Euthymic  Affect:  Congruent  Thought Process:  Coherent and Descriptions of Associations: Intact  Orientation:  Full (Time, Place, and Person)  Thought Content:  WDL  Suicidal Thoughts:  No  Homicidal Thoughts:  No  Memory:  Immediate;   Good Recent;   Good Remote;   Good  Judgement:  Fair  Insight:  Fair  Psychomotor Activity:  Normal  Concentration:  Concentration: Good and Attention Span: Good  Recall:  Good  Fund of Knowledge:  Good  Language:  Good  Akathisia:  No  Handed:  Right  AIMS (if indicated):     Assets:  Communication Skills Desire for Improvement Leisure Time Resilience  ADL's:  Intact  Cognition:  WNL  Sleep:  Number of Hours: 6.25   Problems addressed Bipolar 1 disorder Constipation Bacterial vaginosis Asthma  Treatment Plan Summary: Daily contact with patient to assess and evaluate symptoms and progress in treatment, Medication management and Plan is to:  Start Proair inhaler Q4H PRN for SOB and wheezing Start Albuterol nebulizer Q4H PRN for SOB and wheezing Continue Abilify 5 mg p.o. daily for mood stability Continue Cymbalta 30 mg p.o. daily for mood stability Continue gabapentin 300 mg p.o. 3 times daily for withdrawal symptoms and mood stability Continue Vistaril 50 mg p.o. every 6 hours as needed for anxiety Continue Flagyl 500 mg p.o. every 12 hours for bacterial vaginosis Continue prazosin 1 mg p.o. nightly for PTSD nightmares Continue Topamax 100 mg p.o. nightly for mood stability and migraine  prophylaxis Continue trazodone 100 mg p.o. nightly as needed for insomnia Continue melatonin 5 mg p.o. nightly for sleep Will request patient be sent to [facility] due to reported severe abdominal pain after several doses of laxitaves and no bowel movement.  Encourage group therapy participation  Lewis Shock, FNP 04/14/2018, 10:07 AM   .Agree with NP Progress Note

## 2018-04-14 NOTE — ED Notes (Signed)
Patient transported back to [facility] via [place] transportation.  Discharge paperwork reviewed prior to departure.

## 2018-04-14 NOTE — ED Provider Notes (Addendum)
[place] DEPT Provider Note   CSN: [id number] Arrival date & time: 04/14/18  1116     History   Chief Complaint Chief Complaint  Patient presents with  . Mental Health Problem  . Constipation    HPI Sarah Cervantes is a 37 y.o. female.  HPI is a 37 year old female with past medical history as below currently hospitalized at behavioral health here with abdominal pain.  The patient presents with generalized abdominal pain.  She describes it as an aching, cramping sensation.  She reports that she has a history of constipation and has not had a bowel movement in the last week.  She has passed a small, hard, pellet-like stool but has not had any significant relief.  She had one episode of vomiting several days ago, but has not vomited since then.  She denies any fevers.  She also insulin notes that she is had cough, wheezing, and nasal congestion.  She feels like her abdominal pain is worse after coughing.  No urinary symptoms.  No fevers.  She is hospitalized at behavioral as mentioned.  No specific alleviating or relieving factors.  Past Medical History:  Diagnosis Date  . Cocaine abuse, daily use ([place]) 03/23/2018  . ETOH abuse 03/23/2018  . Heroin abuse ([place]) 03/23/2018    Patient Active Problem List   Diagnosis Date Noted  . Constipation 04/13/2018  . Bacterial vaginosis 04/13/2018  . Bipolar I disorder, current or most recent episode depressed, with psychotic features ([place]) 03/26/2018  . Bacteremia due to Gram-negative bacteria 03/25/2018  . Cocaine abuse with cocaine-induced mood disorder ([place]) 03/24/2018  . PTSD (post-traumatic stress disorder) 03/24/2018  . Sepsis, Gram negative ([place]) 03/23/2018  . Alcohol abuse with intoxication ([place]) 03/23/2018  . Polysubstance dependence including opioid drug with daily use ([place]) 03/23/2018    History reviewed. No pertinent surgical history.   OB History   None      Home Medications     Prior to Admission medications   Medication Sig Start Date End Date Taking? Authorizing Provider  DULoxetine (CYMBALTA) 30 MG capsule Take 1 capsule (30 mg total) by mouth daily. 04/04/18   Pennelope Bracken, MD  gabapentin (NEURONTIN) 300 MG capsule Take 1 capsule (300 mg total) by mouth 3 (three) times daily. 04/03/18   Pennelope Bracken, MD  hydrocortisone (ANUSOL-HC) 2.5 % rectal cream Place rectally 3 (three) times daily. 03/26/18   Charlynne Cousins, MD  hydrOXYzine (ATARAX/VISTARIL) 50 MG tablet Take 1 tablet (50 mg total) by mouth every 6 (six) hours as needed for anxiety. 04/03/18   Pennelope Bracken, MD  prazosin (MINIPRESS) 1 MG capsule Take 1 capsule (1 mg total) by mouth at bedtime. 04/03/18   Pennelope Bracken, MD  QUEtiapine (SEROQUEL) 100 MG tablet Take 1 tablet (100 mg total) by mouth at bedtime. 04/03/18   Pennelope Bracken, MD  topiramate (TOPAMAX) 50 MG tablet Take 1 tablet (50 mg total) by mouth at bedtime. 04/03/18   Pennelope Bracken, MD    Family History History reviewed. No pertinent family history.  Social History Social History   Tobacco Use  . Smoking status: Current Every Day Smoker    Packs/day: 0.50    Years: 30.00    Pack years: 15.00  . Smokeless tobacco: Never Used  Substance Use Topics  . Alcohol use: Yes    Alcohol/week: 12.0 - 15.0 standard drinks    Types: 12 - 15 Cans of beer per week  Comment: drinking daily for the past week  . Drug use: Yes    Types: Cocaine    Comment: Heroin and Cocain on 2018/03/23. Pt uses every night     Allergies   Latex   Review of Systems Review of Systems  Constitutional: Negative for chills, fatigue and fever.  HENT: Negative for congestion and rhinorrhea.   Eyes: Negative for visual disturbance.  Respiratory: Positive for cough and wheezing. Negative for shortness of breath.   Cardiovascular: Negative for chest pain and leg swelling.  Gastrointestinal:  Positive for abdominal pain and constipation. Negative for diarrhea, nausea and vomiting.  Genitourinary: Negative for dysuria and flank pain.  Musculoskeletal: Negative for neck pain and neck stiffness.  Skin: Negative for rash and wound.  Allergic/Immunologic: Negative for immunocompromised state.  Neurological: Negative for syncope, weakness and headaches.  All other systems reviewed and are negative.    Physical Exam Updated Vital Signs BP (!) 145/89   Pulse 73   Temp (!) 97.5 F (36.4 C) (Oral)   Resp 16   Ht 5\' 4"  (1.626 m)   Wt (!) 143.7 kg   LMP 03/23/2018 (Exact Date)   SpO2 94%   BMI 54.38 kg/m   Physical Exam  Constitutional: She is oriented to person, place, and time. She appears well-developed and well-nourished. No distress.  HENT:  Head: Normocephalic and atraumatic.  Mild posterior pharyngeal erythema, with no tonsillar swelling or exudate  Eyes: Conjunctivae are normal.  Neck: Neck supple.  Cardiovascular: Normal rate, regular rhythm and normal heart sounds. Exam reveals no friction rub.  No murmur heard. Pulmonary/Chest: Effort normal. No respiratory distress. She has no decreased breath sounds. She has wheezes (Mild, end expiratory). She has no rhonchi. She has no rales.  Abdominal: She exhibits no distension. There is tenderness (mild, generalized, worse w/ flexion of abdomen). There is no rigidity, no rebound, no guarding, no tenderness at McBurney's point and negative Murphy's sign.  Musculoskeletal: She exhibits no edema.  Neurological: She is alert and oriented to person, place, and time. She exhibits normal muscle tone.  Skin: Skin is warm. Capillary refill takes less than 2 seconds.  Psychiatric: She has a normal mood and affect.  Nursing note and vitals reviewed.    ED Treatments / Results  Labs (all labs ordered are listed, but only abnormal results are displayed) Labs Reviewed  CBC WITH DIFFERENTIAL/PLATELET - Abnormal; Notable for the  following components:      Result Value   WBC 11.3 (*)    Hemoglobin 11.9 (*)    RDW 15.8 (*)    Neutro Abs 7.8 (*)    Monocytes Absolute 1.2 (*)    All other components within normal limits  COMPREHENSIVE METABOLIC PANEL - Abnormal; Notable for the following components:   Calcium 8.4 (*)    Total Protein 6.4 (*)    Albumin 3.3 (*)    All other components within normal limits  URINALYSIS, ROUTINE W REFLEX MICROSCOPIC - Abnormal; Notable for the following components:   Hgb urine dipstick SMALL (*)    Leukocytes, UA TRACE (*)    All other components within normal limits  LIPASE, BLOOD  I-STAT CG4 LACTIC ACID, ED  I-STAT CG4 LACTIC ACID, ED    EKG None  Radiology Dg Chest 2 View  Result Date: 04/14/2018 CLINICAL DATA:  Shortness of breath EXAM: CHEST - 2 VIEW COMPARISON:  03/23/2018 FINDINGS: The heart size and mediastinal contours are within normal limits. Both lungs are clear. The visualized skeletal  structures are unremarkable. IMPRESSION: No active cardiopulmonary disease. Electronically Signed   By: Inez Catalina M.D.   On: 04/14/2018 13:33   Ct Abdomen Pelvis W Contrast  Result Date: 04/14/2018 CLINICAL DATA:  Lower abdominal pain EXAM: CT ABDOMEN AND PELVIS WITH CONTRAST TECHNIQUE: Multidetector CT imaging of the abdomen and pelvis was performed using the standard protocol following bolus administration of intravenous contrast. CONTRAST:  159mL ISOVUE-300 IOPAMIDOL (ISOVUE-300) INJECTION 61% COMPARISON:  None. FINDINGS: Lower chest: No acute abnormality. Hepatobiliary: No focal liver abnormality is seen. Status post cholecystectomy. No biliary dilatation. Pancreas: Unremarkable. No pancreatic ductal dilatation or surrounding inflammatory changes. Spleen: Normal in size without focal abnormality. Adrenals/Urinary Tract: Adrenal glands are within normal limits. Kidneys are well visualized bilaterally. Small calcification is noted in the cortex with some surrounding decreased  attenuation within the right kidney. Minimal scarring in this region is noted. This may represent a small stone within the calyceal diverticulum. No ureteral calculi are seen. No obstructive changes are noted. The bladder is decompressed. Stomach/Bowel: The appendix is not well visualized. No inflammatory changes to suggest appendicitis are noted. The large and small bowel show no obstructive or inflammatory changes. The stomach is within normal limits. Vascular/Lymphatic: No significant vascular findings are present. No enlarged abdominal or pelvic lymph nodes. Reproductive: Uterus is within normal limits. A right ovarian cyst measuring 3.3 cm is noted. No complicating factors are seen. Other: No abdominal wall hernia or abnormality. No abdominopelvic ascites. Musculoskeletal: Degenerative changes of the lumbar spine are noted. IMPRESSION: Tiny cortical calcification in the right kidney which may represent a stone within a caliceal diverticulum. No obstructive changes are seen. Right ovarian cyst without complicating factors. Electronically Signed   By: Inez Catalina M.D.   On: 04/14/2018 15:06    Procedures Procedures (including critical care time)  Medications Ordered in ED Medications  loperamide (IMODIUM) capsule 2-4 mg (has no administration in time range)  LORazepam (ATIVAN) tablet 1 mg (has no administration in time range)  alum & mag hydroxide-simeth (MAALOX/MYLANTA) 200-200-20 MG/5ML suspension 30 mL (has no administration in time range)  magnesium hydroxide (MILK OF MAGNESIA) suspension 30 mL (has no administration in time range)  acetaminophen (TYLENOL) tablet 650 mg (650 mg Oral Given 04/13/18 1953)  ondansetron (ZOFRAN) tablet 4 mg (4 mg Oral Given 04/10/18 1156)  DULoxetine (CYMBALTA) DR capsule 30 mg (30 mg Oral Given 04/14/18 0919)  gabapentin (NEURONTIN) capsule 300 mg (300 mg Oral Given 04/14/18 0919)  hydrOXYzine (ATARAX/VISTARIL) tablet 50 mg (50 mg Oral Given 04/14/18 0020)   mupirocin cream (BACTROBAN) 2 % ( Topical Not Given 04/14/18 0920)  prazosin (MINIPRESS) capsule 1 mg (1 mg Oral Given 04/13/18 2101)  ARIPiprazole (ABILIFY) tablet 5 mg (5 mg Oral Given 04/14/18 0918)  ibuprofen (ADVIL,MOTRIN) tablet 600 mg (600 mg Oral Given 04/12/18 1528)  Melatonin TABS 5 mg (5 mg Oral Given 04/13/18 1954)  traZODone (DESYREL) tablet 100 mg (100 mg Oral Given 04/14/18 0019)  bisacodyl (DULCOLAX) EC tablet 5 mg (5 mg Oral Given 04/13/18 0833)  topiramate (TOPAMAX) tablet 100 mg (100 mg Oral Given 04/13/18 2101)  nicotine polacrilex (NICORETTE) gum 2 mg (2 mg Oral Given 04/13/18 1722)  metroNIDAZOLE (FLAGYL) tablet 500 mg (500 mg Oral Given 04/14/18 0918)  menthol-cetylpyridinium (CEPACOL) lozenge 3 mg (3 mg Oral Given 04/14/18 1503)  albuterol (PROVENTIL HFA;VENTOLIN HFA) 108 (90 Base) MCG/ACT inhaler 2 puff (2 puffs Inhalation Given 04/14/18 0918)  albuterol (PROVENTIL) (2.5 MG/3ML) 0.083% nebulizer solution 2.5 mg (2.5 mg Nebulization Given  04/14/18 1001)  iopamidol (ISOVUE-300) 61 % injection (has no administration in time range)  sodium chloride (PF) 0.9 % injection (has no administration in time range)  ipratropium-albuterol (DUONEB) 0.5-2.5 (3) MG/3ML nebulizer solution 3 mL (has no administration in time range)  doxycycline (VIBRA-TABS) tablet 100 mg (has no administration in time range)  dexamethasone (DECADRON) tablet 10 mg (has no administration in time range)  sodium phosphate (FLEET) 7-19 GM/118ML enema 1 enema (has no administration in time range)  ketorolac (TORADOL) 15 MG/ML injection 15 mg (has no administration in time range)  magnesium citrate solution 1 Bottle (1 Bottle Oral Given 04/12/18 1115)  linaclotide (LINZESS) capsule 290 mcg (290 mcg Oral Given 04/13/18 1308)  magnesium citrate solution 1 Bottle (1 Bottle Oral Given 04/13/18 2000)  HYDROmorphone (DILAUDID) injection 1 mg (1 mg Intravenous Given 04/14/18 1304)  ondansetron (ZOFRAN) injection 4  mg (4 mg Intravenous Given 04/14/18 1303)  iopamidol (ISOVUE-300) 61 % injection 100 mL (100 mLs Intravenous Contrast Given 04/14/18 1423)     Initial Impression / Assessment and Plan / ED Course  I have reviewed the triage vital signs and the nursing notes.  Pertinent labs & imaging results that were available during my care of the patient were reviewed by me and considered in my medical decision making (see chart for details).     37 year old female here with mild, generalized abdominal pain in the setting of acute on chronic constipation.  Lab work today is reassuring.  CT scan is without acute abnormality.  She has an incidental kidney stone which I do not feel is contributing and there is no obstruction.  Urinalysis without UTI.  She has no vaginal symptoms.  She does have a moderate stool burden on my review of scan, and will give her a dose of enema and start her on a good bowel regimen.  Otherwise, I suspect some of her pain may also be due to abdominal wall pain from coughing.  She has a likely viral URI.  Chest x-ray is without pneumonia.  She is not hypoxic and has normal work of breathing.  She was given a dose of steroids and nebulizer here, and will start on doxycycline for possible bacterial bronchitis given her sputum production.  For her bowel regimen at [facility], [place] recommend: Colace 100 mg BID Miralax QD, with additional 2-3 doses daily PRN bowel movement Continue Linzess/other meds   Final Clinical Impressions(s) / ED Diagnoses   Final diagnoses:  Upper respiratory tract infection, unspecified type  Chronic idiopathic constipation    ED Discharge Orders    None       Duffy Bruce, MD 04/14/18 1556    Duffy Bruce, MD 04/14/18 1557

## 2018-04-14 NOTE — Progress Notes (Signed)
Patient transported to [facility] ED  via [facility] accompanied per MHT, Barbaraann Rondo.

## 2018-04-14 NOTE — ED Triage Notes (Signed)
She comes to [place] via [facility] and is currently an inpatient at our [facility]. She c/o constipation. She is in no distress.

## 2018-04-14 NOTE — Progress Notes (Signed)
D.  Pt pleasant but anxious on approach, complaint of constipation.  Pt was positive for evening AA group, observed engaged in appropriate interaction with peers on the unit.  Pt denies SI/HI/AVH at this time.  Pt states she feels as if she is getting a cold and requested throat lozenges.  A. Support and encouragement offered, medication given as ordered.  NP notified and order received for Cepacol.  R.  Pt remains safe on the unit, will continue to monitor.

## 2018-04-14 NOTE — [facility] Group Notes (Signed)
[place] LCSW Group Therapy Note  04/14/2018  9:00-10:00AM  Type of Therapy and Topic:  Group Therapy:  Adding Supports Including Being Your Own Support  Participation Level:  Did Not Attend   Description of Group:  Patients in this group were introduced to the concept that additional supports including self-support are an essential part of recovery.  A song entitled "I Need Help!" was played and a group discussion was held in reaction to the idea of needing to add supports.  A song entitled "My Own Hero" was played and a group discussion ensued in which patients stated they could relate to the song and it inspired them to realize they have be willing to help themselves in order to succeed, because other people cannot achieve sobriety or stability for them.  We discussed adding a variety of healthy supports to address the various needs in their lives.  Therapeutic Goals: 1)  demonstrate the importance of being a part of one's own support system 2)  discuss reasons people in one's life may eventually be unable to be continually supportive  3)  identify the patient's current support system and   4)  elicit commitments to add healthy supports and to become more conscious of being self-supportive   Summary of Patient Progress:  The patient did not attend, was seen in the hall talking to providers.   Therapeutic Modalities:   Motivational Interviewing Activity  Maretta Los

## 2018-04-14 NOTE — Progress Notes (Signed)
D . Pt pleasant on approach, states she has been in ED most of day.  Pt requested a salad and was given this.  Pt has new medication orders for her constipation.  Pt went to first 15 minutes of group then came out to get food.  Pt observed engaged in appropriate interaction with peers on the unit, playing cards in dayroom.  Pt denies SI/HI/AVH at this time.  Pt has new raised place on left hand that appears to be blown vein from IV in ED.  A.  Support and encouragement offered, medication given as ordered  R.  Pt remains safe on the unit, will continue to monitor.

## 2018-04-14 NOTE — ED Notes (Signed)
Patient transported to X-ray 

## 2018-04-14 NOTE — ED Triage Notes (Signed)
Nurse from [facility] called PTA stating pt is constipated and has a productive cough. They have given a variety of meds to increase bowel movement without results, pt is vol SI and will come by [facility].

## 2018-04-14 NOTE — ED Notes (Signed)
Gave report to [facility] at Adult [facility] unit.

## 2018-04-14 NOTE — Progress Notes (Signed)
Report given to Patty, [place] called

## 2018-04-14 NOTE — ED Notes (Signed)
Bed: [facility]1 Expected date:  Expected time:  Means of arrival:  Comments: 

## 2018-04-15 MED ORDER — HYDROXYZINE HCL 50 MG PO TABS
50.0000 mg | ORAL_TABLET | Freq: Every day | ORAL | Status: DC
  Administered 2018-04-15 – 2018-04-17 (×3): 50 mg via ORAL
  Filled 2018-04-15 (×7): qty 1

## 2018-04-15 MED ORDER — BISACODYL 10 MG RE SUPP
10.0000 mg | Freq: Once | RECTAL | Status: AC
  Administered 2018-04-15: 10 mg via RECTAL
  Filled 2018-04-15 (×2): qty 1

## 2018-04-15 MED ORDER — HYDROXYZINE HCL 50 MG PO TABS
50.0000 mg | ORAL_TABLET | Freq: Three times a day (TID) | ORAL | Status: DC | PRN
  Administered 2018-04-16 – 2018-04-23 (×5): 50 mg via ORAL
  Filled 2018-04-15 (×4): qty 1
  Filled 2018-04-15: qty 10
  Filled 2018-04-15 (×2): qty 1

## 2018-04-15 NOTE — Progress Notes (Signed)
Recreation Therapy Notes  Date: 11.18.19 Time: 0930 Location: 300 Hall Dayroom  Group Topic: Stress Management  Goal Area(s) Addresses:  Patient will verbalize importance of using healthy stress management.  Patient will identify positive emotions associated with healthy stress management.   Intervention: Stress Management  Activity :  Guided Imagery.  LRT introduced the stress management technique of guided imagery.  LRT read a script that took patients on a journey through the forest.  Patients were to listen as the script was read to engage in the activity.  Education:  Stress Management, Discharge Planning.   Education Outcome: Acknowledges edcuation/In group clarification offered/Needs additional education  Clinical Observations/Feedback: Pt did not attend group.     Victorino Sparrow,  LRT/CTRS         Ria Comment, Lyle Leisner A 04/15/2018 11:22 AM

## 2018-04-15 NOTE — Plan of Care (Signed)
Nurse discussed anxiety, depression and coping skills with patient.  

## 2018-04-15 NOTE — Tx Team (Signed)
Interdisciplinary Treatment and Diagnostic Plan Update  04/15/2018 Time of Session: 1610RU Sarah Cervantes MRN: [id number]  Principal Diagnosis: Bipolar Disorder   Secondary Diagnoses: Principal Problem:   Polysubstance dependence including opioid drug with daily use ([place]) Active Problems:   Bipolar I disorder, current or most recent episode depressed, with psychotic features ([place])   Constipation   Bacterial vaginosis   Current Medications:  Current Facility-Administered Medications  Medication Dose Route Frequency Provider Last Rate Last Dose  . acetaminophen (TYLENOL) tablet 650 mg  650 mg Oral Q4H PRN Lindon Romp A, NP   650 mg at 04/15/18 0329  . albuterol (PROVENTIL HFA;VENTOLIN HFA) 108 (90 Base) MCG/ACT inhaler 2 puff  2 puff Inhalation Q4H PRN Money, Lowry Ram, FNP   2 puff at 04/15/18 1203  . albuterol (PROVENTIL) (2.5 MG/3ML) 0.083% nebulizer solution 2.5 mg  2.5 mg Nebulization Q4H PRN Money, Darnelle Maffucci B, FNP   2.5 mg at 04/14/18 1001  . alum & mag hydroxide-simeth (MAALOX/MYLANTA) 200-200-20 MG/5ML suspension 30 mL  30 mL Oral Q4H PRN Lindon Romp A, NP      . ARIPiprazole (ABILIFY) tablet 5 mg  5 mg Oral Daily Pennelope Bracken, MD   5 mg at 04/15/18 0843  . bisacodyl (DULCOLAX) EC tablet 5 mg  5 mg Oral Daily PRN Pennelope Bracken, MD   5 mg at 04/13/18 0833  . docusate sodium (COLACE) capsule 100 mg  100 mg Oral BID Cobos, Myer Peer, MD   100 mg at 04/15/18 0842  . doxycycline (VIBRA-TABS) tablet 100 mg  100 mg Oral Q12H Duffy Bruce, MD   100 mg at 04/15/18 1057  . DULoxetine (CYMBALTA) DR capsule 30 mg  30 mg Oral Daily Lindon Romp A, NP   30 mg at 04/15/18 0842  . gabapentin (NEURONTIN) capsule 300 mg  300 mg Oral TID Lindon Romp A, NP   300 mg at 04/15/18 1207  . hydrOXYzine (ATARAX/VISTARIL) tablet 50 mg  50 mg Oral TID PRN Lindell Spar I, NP      . hydrOXYzine (ATARAX/VISTARIL) tablet 50 mg  50 mg Oral QHS Nwoko, Agnes I, NP      . ibuprofen  (ADVIL,MOTRIN) tablet 600 mg  600 mg Oral Q6H PRN Pennelope Bracken, MD   600 mg at 04/12/18 1528  . magnesium hydroxide (MILK OF MAGNESIA) suspension 30 mL  30 mL Oral Daily PRN Rozetta Nunnery, NP      . Melatonin TABS 5 mg  5 mg Oral QHS Pennelope Bracken, MD   5 mg at 04/14/18 2002  . menthol-cetylpyridinium (CEPACOL) lozenge 3 mg  1 lozenge Oral PRN Lindon Romp A, NP   3 mg at 04/14/18 1503  . metroNIDAZOLE (FLAGYL) tablet 500 mg  500 mg Oral Q12H Money, Lowry Ram, FNP   500 mg at 04/15/18 0842  . mupirocin cream (BACTROBAN) 2 %   Topical BID Lindon Romp A, NP      . nicotine polacrilex (NICORETTE) gum 2 mg  2 mg Oral PRN Cobos, Myer Peer, MD   2 mg at 04/13/18 1722  . ondansetron (ZOFRAN) tablet 4 mg  4 mg Oral Q8H PRN Lindon Romp A, NP   4 mg at 04/10/18 1156  . prazosin (MINIPRESS) capsule 1 mg  1 mg Oral QHS Lindon Romp A, NP   1 mg at 04/14/18 2100  . topiramate (TOPAMAX) tablet 100 mg  100 mg Oral QHS Pennelope Bracken, MD   100 mg at 04/14/18 2101  .  traZODone (DESYREL) tablet 100 mg  100 mg Oral QHS,MR X 1 Pennelope Bracken, MD   100 mg at 04/15/18 0013   PTA Medications: Medications Prior to Admission  Medication Sig Dispense Refill Last Dose  . DULoxetine (CYMBALTA) 30 MG capsule Take 1 capsule (30 mg total) by mouth daily. 30 capsule 0 04/09/2018 at Unknown time  . gabapentin (NEURONTIN) 300 MG capsule Take 1 capsule (300 mg total) by mouth 3 (three) times daily. 45 capsule 1 Past Week at Unknown time  . hydrocortisone (ANUSOL-HC) 2.5 % rectal cream Place rectally 3 (three) times daily. 30 g 0 Past Month at Unknown time  . hydrOXYzine (ATARAX/VISTARIL) 50 MG tablet Take 1 tablet (50 mg total) by mouth every 6 (six) hours as needed for anxiety. 30 tablet 0 Past Week at Unknown time  . prazosin (MINIPRESS) 1 MG capsule Take 1 capsule (1 mg total) by mouth at bedtime. 30 capsule 0 Past Week at Unknown time  . QUEtiapine (SEROQUEL) 100 MG tablet Take 1  tablet (100 mg total) by mouth at bedtime. 30 tablet 0 Past Week at Unknown time  . topiramate (TOPAMAX) 50 MG tablet Take 1 tablet (50 mg total) by mouth at bedtime. 30 tablet 0 Past Week at Unknown time    Patient Stressors: Financial difficulties Medication change or noncompliance Substance abuse  Patient Strengths: Ability for insight Average or above average intelligence Capable of independent living [employer] of knowledge Motivation for treatment/growth  Treatment Modalities: Medication Management, Group therapy, Case management,  1 to 1 session with clinician, Psychoeducation, Recreational therapy.   Physician Treatment Plan for Primary Diagnosis: Bipolar Disorder  Medication Management: Evaluate patient's response, side effects, and tolerance of medication regimen.  Therapeutic Interventions: 1 to 1 sessions, Unit Group sessions and Medication administration.  Evaluation of Outcomes: Progressing  Physician Treatment Plan for Secondary Diagnosis: Principal Problem:   Polysubstance dependence including opioid drug with daily use ([place]) Active Problems:   Bipolar I disorder, current or most recent episode depressed, with psychotic features ([name])   Constipation   Bacterial vaginosis   Medication Management: Evaluate patient's response, side effects, and tolerance of medication regimen.  Therapeutic Interventions: 1 to 1 sessions, Unit Group sessions and Medication administration.  Evaluation of Outcomes: Progressing   RN Treatment Plan for Primary Diagnosis: Bipolar Disorder  Long Term Goal(s): Knowledge of disease and therapeutic regimen to maintain health will improve  Short Term Goals: Ability to remain free from injury will improve, Ability to verbalize feelings will improve and Ability to disclose and discuss suicidal ideas  Medication Management: RN will administer medications as ordered by provider, will assess and evaluate patient's response and provide  education to patient for prescribed medication. RN will report any adverse and/or side effects to prescribing provider.  Therapeutic Interventions: 1 on 1 counseling sessions, Psychoeducation, Medication administration, Evaluate responses to treatment, Monitor vital signs and CBGs as ordered, Perform/monitor CIWA, COWS, AIMS and Fall Risk screenings as ordered, Perform wound care treatments as ordered.  Evaluation of Outcomes: Progressing   LCSW Treatment Plan for Primary Diagnosis: Bipolar Disorder  Long Term Goal(s): Safe transition to appropriate next level of care at discharge, Engage patient in therapeutic group addressing interpersonal concerns.  Short Term Goals: Engage patient in aftercare planning with referrals and resources, Facilitate patient progression through stages of change regarding substance use diagnoses and concerns and Identify triggers associated with mental health/substance abuse issues  Therapeutic Interventions: Assess for all discharge needs, 1 to 1 time with Social  worker, Explore available resources and support systems, Assess for adequacy in community support network, Educate family and significant other(s) on suicide prevention, Complete Psychosocial Assessment, Interpersonal group therapy.  Evaluation of Outcomes: Progressing   Progress in Treatment: Attending groups: No.   Participating in groups: No. Taking medication as prescribed: Yes. Toleration medication: Yes. Family/Significant other contact made: No, will contact:  family member if pt consents to collateral contact.  Patient understands diagnosis: Yes. Discussing patient identified problems/goals with staff: Yes. Medical problems stabilized or resolved: Yes. Denies suicidal/homicidal ideation: Yes. Issues/concerns per patient self-inventory: No. Other: n/a  New problem(s) identified: No, Describe:  n/a  New Short Term/Long Term Goal(s): detox, medication management for mood stabilization;  elimination of SI thoughts; development of comprehensive mental wellness/sobriety plan.   Patient Goals:  "I need better coping skills and I have to learn how to live sober."   Discharge Plan or Barriers: CSW assessing--pt no longer eligible for [facility]. Pt requesting [facility] referral. [facility] pamphlet, Mobile Crisis information, and AA/NA information provided to patient for additional community support and resources.   Reason for Continuation of Hospitalization: Anxiety Depression Medication stabilization Suicidal ideation Withdrawal symptoms  Estimated Length of Stay: Monday, 04/15/18  Attendees: Patient: 04/15/2018 1:33 PM  Physician: Dr. Nancy Fetter MD 04/15/2018 1:33 PM  Nursing: Estill Bamberg RN; Chrys Racer RN; Manuel Garcia.Raliegh Ip, RN 04/15/2018 1:33 PM  RN Care Manager:Jennifer Carlis Abbott, RN 04/15/2018 1:33 PM  Social Worker: Janice Norrie LCSW; Radonna Ricker, [place] 04/15/2018 1:33 PM  Recreational Therapist: x 04/15/2018 1:33 PM  Other: Lindell Spar NP 04/15/2018 1:33 PM  Other:  04/15/2018 1:33 PM  Other: 04/15/2018 1:33 PM    Scribe for Treatment Team: Marylee Floras, [place] 04/15/2018 1:33 PM

## 2018-04-15 NOTE — Progress Notes (Signed)
D:  Patient's self inventory sheet, patient has poor sleep, sleep medication not given.  Fair appetite, low energy level, poor concentration.  Rated depression, hopeless and anxiety 6.  Denied withdrawals.  Denied SI.  Physical problems, pain, cannot go to bathroom.  Physical pain, worst pain in past 24 hours is #7.               Pain medicine not working.  Goal go to bathroom.  "Give me about 3 cups of MOM at same time".  No discharge plans. A:  Medications administered per MD orders.  Emotional support and encouragement given patient. R:  Denied SI and HI, contracts for safety.  Denied A/V hallucinations.  Safety maintained with 15 minute checks.

## 2018-04-15 NOTE — Progress Notes (Signed)
[facility] MD Progress Note  04/15/2018 11:34 AM Sarah Cervantes  MRN:  [id number]   Subjective: [name] reports, "My mood is good but, I'm feeling ill because I can't use the bathroom. I feel very constipated. Sleep is not good either. Trazodone & the melatonin are not helping me with sleep. Seroquel helps me sleep well, but, the last time I took it, I gained 15 pounds in 1 month. This is because, Seroquel makes me hungry & I eat a lot. My depression today is only at #2. No anxiety at all. I will feel really good if I can use the bathroom".  Objective: Patient is seen, chart reviewed. The chart and findings  discussed with the treatment team. Patient presents alert, oriented & visible on the unit. She is participating in the milieu. She reports  improved symptoms of depression, denies any anxiety symptoms. Says she is feeling ill because she has not been able to have a bowel movement, today being the 6th day. She was evaluated at the [facility] ED yesterday 04-14-18 for potential blockage or impaction. However, she was recommended some remedies for constipation relief. She will also receive a dose of dulcolax suppository 10 mg once today for constipation as well. She continues to complain of not being able to sleep at night. Has adjusted her Vistaril from 50 mg qid prn to Vistaril 50 mg tid prn & scheduled Vistaril 50 mg po Q hs for insomnia. Patient is in agreement with this plan.  Patient is pleasant, calm, and cooperative.  Patient does appear to be in any apparent distress.  Principal Problem: Polysubstance dependence including opioid drug with daily use ([place]) Diagnosis:   Patient Active Problem List   Diagnosis Date Noted  . Constipation [K59.00] 04/13/2018  . Bacterial vaginosis [N76.0, B96.89] 04/13/2018  . Bipolar I disorder, current or most recent episode depressed, with psychotic features ([place]) [F31.5] 03/26/2018  . Bacteremia due to Gram-negative bacteria [R78.81] 03/25/2018  . Cocaine  abuse with cocaine-induced mood disorder ([place]) [F14.14] 03/24/2018  . PTSD (post-traumatic stress disorder) [F43.10] 03/24/2018  . Sepsis, Gram negative ([place]) [A41.50] 03/23/2018  . Alcohol abuse with intoxication ([place]) [F10.129] 03/23/2018  . Polysubstance dependence including opioid drug with daily use ([place]) [F11.20, F19.20] 03/23/2018   Total Time spent with patient: 15 minutes  Past Psychiatric History: See H&P  Past Medical History:  Past Medical History:  Diagnosis Date  . Cocaine abuse, daily use ([place]) 03/23/2018  . ETOH abuse 03/23/2018  . Heroin abuse ([place]) 03/23/2018   History reviewed. No pertinent surgical history.  Family History: History reviewed. No pertinent family history.  Family Psychiatric  History: See H&P  Social History:  Social History   Substance and Sexual Activity  Alcohol Use Yes  . Alcohol/week: 12.0 - 15.0 standard drinks  . Types: 12 - 15 Cans of beer per week   Comment: drinking daily for the past week     Social History   Substance and Sexual Activity  Drug Use Yes  . Types: Cocaine   Comment: Heroin and Cocain on 2018/03/23. Pt uses every night    Social History   Socioeconomic History  . Marital status: Single    Spouse name: Not on file  . Number of children: Not on file  . Years of education: Not on file  . Highest education level: Not on file  Occupational History  . Not on file  Social Needs  . Financial resource strain: Not on file  . Food insecurity:  Worry: Not on file    Inability: Not on file  . Transportation needs:    Medical: Not on file    Non-medical: Not on file  Tobacco Use  . Smoking status: Current Every Day Smoker    Packs/day: 0.50    Years: 30.00    Pack years: 15.00  . Smokeless tobacco: Never Used  Substance and Sexual Activity  . Alcohol use: Yes    Alcohol/week: 12.0 - 15.0 standard drinks    Types: 12 - 15 Cans of beer per week    Comment: drinking daily for the past week  . Drug use: Yes     Types: Cocaine    Comment: Heroin and Cocain on 2018/03/23. Pt uses every night  . Sexual activity: Yes    Birth control/protection: None  Lifestyle  . Physical activity:    Days per week: Not on file    Minutes per session: Not on file  . Stress: Not on file  Relationships  . Social connections:    Talks on phone: Not on file    Gets together: Not on file    Attends religious service: Not on file    Active member of club or organization: Not on file    Attends meetings of clubs or organizations: Not on file    Relationship status: Not on file  Other Topics Concern  . Not on file  Social History Narrative  . Not on file   Additional Social History:    Pain Medications: See med list Prescriptions: See med list Over the Counter: See med list History of alcohol / drug use?: Yes Longest period of sobriety (when/how long): "3 weeks" Negative Consequences of Use: Personal relationships, Financial, Work / School Name of Substance 1: cocaine 1 - Age of First Use: teens 1 - Amount (size/oz): varies 1 - Frequency: several times a week 1 - Duration: ongoing 1 - Last Use / Amount: 04/09/18 Name of Substance 2: heroin 2 - Age of First Use: 20's 2 - Amount (size/oz): varies 2 - Frequency: occasional 2 - Duration: ongoing 2 - Last Use / Amount: 1/4 gm 04/09/18 Name of Substance 3: ETOH 3 - Age of First Use: teens 3 - Amount (size/oz): varies 3 - Frequency: daily for over a week 3 - Duration: ongoing 3 - Last Use / Amount: 04/09/18  Sleep: Good  Appetite:  Good  Current Medications: Current Facility-Administered Medications  Medication Dose Route Frequency Provider Last Rate Last Dose  . acetaminophen (TYLENOL) tablet 650 mg  650 mg Oral Q4H PRN Lindon Romp A, NP   650 mg at 04/15/18 0329  . albuterol (PROVENTIL HFA;VENTOLIN HFA) 108 (90 Base) MCG/ACT inhaler 2 puff  2 puff Inhalation Q4H PRN Money, Lowry Ram, FNP   2 puff at 04/14/18 0918  . albuterol (PROVENTIL) (2.5  MG/3ML) 0.083% nebulizer solution 2.5 mg  2.5 mg Nebulization Q4H PRN Money, Darnelle Maffucci B, FNP   2.5 mg at 04/14/18 1001  . alum & mag hydroxide-simeth (MAALOX/MYLANTA) 200-200-20 MG/5ML suspension 30 mL  30 mL Oral Q4H PRN Lindon Romp A, NP      . ARIPiprazole (ABILIFY) tablet 5 mg  5 mg Oral Daily Pennelope Bracken, MD   5 mg at 04/15/18 0843  . bisacodyl (DULCOLAX) EC tablet 5 mg  5 mg Oral Daily PRN Pennelope Bracken, MD   5 mg at 04/13/18 0833  . bisacodyl (DULCOLAX) suppository 10 mg  10 mg Rectal Once Lindell Spar I, NP      .  docusate sodium (COLACE) capsule 100 mg  100 mg Oral BID Cobos, Myer Peer, MD   100 mg at 04/15/18 0842  . doxycycline (VIBRA-TABS) tablet 100 mg  100 mg Oral Q12H Duffy Bruce, MD   100 mg at 04/15/18 1057  . DULoxetine (CYMBALTA) DR capsule 30 mg  30 mg Oral Daily Lindon Romp A, NP   30 mg at 04/15/18 0842  . gabapentin (NEURONTIN) capsule 300 mg  300 mg Oral TID Lindon Romp A, NP   300 mg at 04/15/18 0842  . hydrOXYzine (ATARAX/VISTARIL) tablet 50 mg  50 mg Oral Q6H PRN Lindon Romp A, NP   50 mg at 04/15/18 0329  . ibuprofen (ADVIL,MOTRIN) tablet 600 mg  600 mg Oral Q6H PRN Pennelope Bracken, MD   600 mg at 04/12/18 1528  . magnesium hydroxide (MILK OF MAGNESIA) suspension 30 mL  30 mL Oral Daily PRN Rozetta Nunnery, NP      . Melatonin TABS 5 mg  5 mg Oral QHS Pennelope Bracken, MD   5 mg at 04/14/18 2002  . menthol-cetylpyridinium (CEPACOL) lozenge 3 mg  1 lozenge Oral PRN Lindon Romp A, NP   3 mg at 04/14/18 1503  . metroNIDAZOLE (FLAGYL) tablet 500 mg  500 mg Oral Q12H Money, Lowry Ram, FNP   500 mg at 04/15/18 0842  . mupirocin cream (BACTROBAN) 2 %   Topical BID Lindon Romp A, NP      . nicotine polacrilex (NICORETTE) gum 2 mg  2 mg Oral PRN Cobos, Myer Peer, MD   2 mg at 04/13/18 1722  . ondansetron (ZOFRAN) tablet 4 mg  4 mg Oral Q8H PRN Lindon Romp A, NP   4 mg at 04/10/18 1156  . prazosin (MINIPRESS) capsule 1 mg  1 mg  Oral QHS Lindon Romp A, NP   1 mg at 04/14/18 2100  . topiramate (TOPAMAX) tablet 100 mg  100 mg Oral QHS Pennelope Bracken, MD   100 mg at 04/14/18 2101  . traZODone (DESYREL) tablet 100 mg  100 mg Oral QHS,MR X 1 Pennelope Bracken, MD   100 mg at 04/15/18 0013   Lab Results:  Results for orders placed or performed during the hospital encounter of 04/10/18 (from the past 48 hour(s))  Urinalysis, Routine w reflex microscopic     Status: Abnormal   Collection Time: 04/14/18 12:18 PM  Result Value Ref Range   Color, Urine YELLOW YELLOW   APPearance CLEAR CLEAR   Specific Gravity, Urine 1.016 1.005 - 1.030   pH 7.0 5.0 - 8.0   Glucose, UA NEGATIVE NEGATIVE mg/dL   Hgb urine dipstick SMALL (A) NEGATIVE   Bilirubin Urine NEGATIVE NEGATIVE   Ketones, ur NEGATIVE NEGATIVE mg/dL   Protein, ur NEGATIVE NEGATIVE mg/dL   Nitrite NEGATIVE NEGATIVE   Leukocytes, UA TRACE (A) NEGATIVE   RBC / HPF 0-5 0 - 5 RBC/hpf   WBC, UA 0-5 0 - 5 WBC/hpf   Bacteria, UA NONE SEEN NONE SEEN   Squamous Epithelial / LPF 0-5 0 - 5    Comment: Performed at [facility], [place] [address], [place], [place] [address]  CBC with Differential     Status: Abnormal   Collection Time: 04/14/18 12:44 PM  Result Value Ref Range   WBC 11.3 (H) 4.0 - 10.5 K/uL   RBC 3.96 3.87 - 5.11 MIL/uL   Hemoglobin 11.9 (L) 12.0 - 15.0 g/dL   HCT 37.6 36.0 - 46.0 %  MCV 94.9 80.0 - 100.0 fL   MCH 30.1 26.0 - 34.0 pg   MCHC 31.6 30.0 - 36.0 g/dL   RDW 15.8 (H) 11.5 - 15.5 %   Platelets 371 150 - 400 K/uL   nRBC 0.0 0.0 - 0.2 %   Neutrophils Relative % 69 %   Neutro Abs 7.8 (H) 1.7 - 7.7 K/uL   Lymphocytes Relative 17 %   Lymphs Abs 1.9 0.7 - 4.0 K/uL   Monocytes Relative 11 %   Monocytes Absolute 1.2 (H) 0.1 - 1.0 K/uL   Eosinophils Relative 2 %   Eosinophils Absolute 0.3 0.0 - 0.5 K/uL   Basophils Relative 0 %   Basophils Absolute 0.0 0.0 - 0.1 K/uL   Immature Granulocytes 1 %   Abs Immature  Granulocytes 0.06 0.00 - 0.07 K/uL    Comment: Performed at [facility], [place] [address] [place], [place] [address]  Comprehensive metabolic panel     Status: Abnormal   Collection Time: 04/14/18 12:44 PM  Result Value Ref Range   Sodium 138 135 - 145 mmol/L   Potassium 4.4 3.5 - 5.1 mmol/L   Chloride 107 98 - 111 mmol/L   CO2 25 22 - 32 mmol/L   Glucose, Bld 98 70 - 99 mg/dL   BUN 15 6 - 20 mg/dL   Creatinine, Ser 0.96 0.44 - 1.00 mg/dL   Calcium 8.4 (L) 8.9 - 10.3 mg/dL   Total Protein 6.4 (L) 6.5 - 8.1 g/dL   Albumin 3.3 (L) 3.5 - 5.0 g/dL   AST 22 15 - 41 U/L   ALT 20 0 - 44 U/L   Alkaline Phosphatase 53 38 - 126 U/L   Total Bilirubin 0.8 0.3 - 1.2 mg/dL   GFR calc non Af Amer >60 >60 mL/min   GFR calc Af Amer >60 >60 mL/min    Comment: (NOTE) The eGFR has been calculated using the CKD EPI equation. This calculation has not been validated in all clinical situations. eGFR's persistently <60 mL/min signify possible Chronic Kidney Disease.    Anion gap 6 5 - 15    Comment: Performed at [facility], [place] [address] [place], [place] [address]  Lipase, blood     Status: None   Collection Time: 04/14/18 12:44 PM  Result Value Ref Range   Lipase 26 11 - 51 U/L    Comment: Performed at [facility], [place] [address], [place], [place] [address]  I-Stat CG4 Lactic Acid, ED     Status: None   Collection Time: 04/14/18 12:49 PM  Result Value Ref Range   Lactic Acid, Venous 0.96 0.5 - 1.9 mmol/L   Blood Alcohol level:  Lab Results  Component Value Date   ETH <10 04/09/2018   ETH <10 21/30/8657   Metabolic Disorder Labs: Lab Results  Component Value Date   HGBA1C 5.6 03/24/2018   MPG 114.02 03/24/2018   No results found for: PROLACTIN No results found for: CHOL, TRIG, HDL, CHOLHDL, VLDL, LDLCALC  Physical Findings: AIMS: Facial and Oral Movements Muscles of Facial Expression: None, normal Lips and Perioral Area:  None, normal Jaw: None, normal Tongue: None, normal,Extremity Movements Upper (arms, wrists, hands, fingers): None, normal Lower (legs, knees, ankles, toes): None, normal, Trunk Movements Neck, shoulders, hips: None, normal, Overall Severity Severity of abnormal movements (highest score from questions above): None, normal Incapacitation due to abnormal movements: None, normal Patient's awareness of abnormal movements (rate only patient's report): No Awareness,  Dental Status Current problems with teeth and/or dentures?: No Does patient usually wear dentures?: No  CIWA:  CIWA-Ar Total: 0 COWS:     Musculoskeletal: Strength & Muscle Tone: within normal limits Gait & Station: normal Patient leans: N/A  Psychiatric Specialty Exam: Physical Exam  Nursing note and vitals reviewed. Constitutional: She is oriented to person, place, and time. She appears well-developed and well-nourished.  Cardiovascular: Normal rate.  Respiratory: Effort normal. She has wheezes.  Musculoskeletal: Normal range of motion.  Neurological: She is alert and oriented to person, place, and time.  Skin: Skin is warm.    Review of Systems  Constitutional: Negative.   HENT: Negative.   Eyes: Negative.   Respiratory: Positive for sputum production and wheezing.   Cardiovascular: Negative.   Gastrointestinal: Positive for constipation.  Genitourinary: Negative.   Musculoskeletal: Negative.   Skin: Negative.   Neurological: Negative.   Endo/Heme/Allergies: Negative.     Blood pressure (!) 150/93, pulse (!) 103, temperature 98.1 F (36.7 C), temperature source Oral, resp. rate 16, height 5' 4"  (1.626 m), weight (!) 143.7 kg, last menstrual period 03/23/2018, SpO2 94 %.Body mass index is 54.38 kg/m.  General Appearance: Casual  Eye Contact:  Good  Speech:  Clear and Coherent and Normal Rate  Volume:  Normal  Mood:  Euthymic  Affect:  Congruent  Thought Process:  Coherent and Descriptions of Associations:  Intact  Orientation:  Full (Time, Place, and Person)  Thought Content:  WDL  Suicidal Thoughts:  No  Homicidal Thoughts:  No  Memory:  Immediate;   Good Recent;   Good Remote;   Good  Judgement:  Fair  Insight:  Fair  Psychomotor Activity:  Normal  Concentration:  Concentration: Good and Attention Span: Good  Recall:  Good  Fund of Knowledge:  Good  Language:  Good  Akathisia:  No  Handed:  Right  AIMS (if indicated):     Assets:  Communication Skills Desire for Improvement Leisure Time Resilience  ADL's:  Intact  Cognition:  WNL  Sleep:  Number of Hours: 4.25   Treatment Plan Summary: Daily contact with patient to assess and evaluate symptoms and progress in treatment and Medication management   -Continue inpatient hospitalization.  -Will continue today 04/15/2018 plan as below except where it is noted.  SOB/wheezing.      - Continue Proair inhaler Q4H PRN.      -  Albuterol nebulizer Q4H PRN for SOB.  Mood control.      - Continue Abilify 5 mg p.o. Daily.      - Continue Topamax 100 mg p.o. nightly for mood stability and migraine prophylaxis  Depression.      - Continue Cymbalta 30 mg p.o. Daily.  Agitation/anxiety.      - gabapentin 300 mg p.o. 3 times daily.      - Changed Vistaril 50 mg p.o. every 6 hours To Vistaril 50 mg po tid prn.  Bacterial Vaginosis      - Continue Flagyl 500 mg p.o. every 12 hours.  PTSD/nightmares..      - Continue prazosin 1 mg p.o. Nightly.  Insomnia.      - Continue trazodone 100 mg p.o. Nightly.      - Continue melatonin 5 mg p.o. Nightly.      -Initiated Vistaril 50 mg po Q hs.  Constipation Was sent to [facility] due to reported severe abdominal pain after several doses of laxitaves and no bowel movement. See EDP's recommendation;       -  Colace 100 mg bid.       - Miralax 17 gm 23 dose daily prn.       - Continue linzess as recommended.       - Dulcolax Suppository 10 mg per rectum once for constipation 04-15-18.        Encourage group therapy participation.  Discharge disposition plan ongoing.  Lindell Spar, NP, pmhnp, fnp-bc 04/15/2018, 11:34 AM   Patient ID: Sarah Cervantes, female   DOB: September 25, 1980, 38 y.o.   MRN: [id number]

## 2018-04-15 NOTE — Progress Notes (Signed)
Patient requested deodorant which was removed from locker and given to her.  Patient requested body motion which has alcohol listed in first 3 items of contents on label.  MD informed who is talking to patient.

## 2018-04-15 NOTE — Progress Notes (Signed)
Patient stated her family will bring in lotion for her to use and alcohol will not be listed in the first three ingredients.

## 2018-04-16 MED ORDER — GABAPENTIN 400 MG PO CAPS
400.0000 mg | ORAL_CAPSULE | Freq: Three times a day (TID) | ORAL | Status: DC
  Administered 2018-04-16 – 2018-04-23 (×21): 400 mg via ORAL
  Filled 2018-04-16 (×2): qty 1
  Filled 2018-04-16: qty 21
  Filled 2018-04-16 (×3): qty 1
  Filled 2018-04-16: qty 21
  Filled 2018-04-16 (×5): qty 1
  Filled 2018-04-16 (×2): qty 21
  Filled 2018-04-16 (×4): qty 1
  Filled 2018-04-16: qty 21
  Filled 2018-04-16 (×6): qty 1
  Filled 2018-04-16: qty 21
  Filled 2018-04-16: qty 1

## 2018-04-16 MED ORDER — GABAPENTIN 400 MG PO CAPS
ORAL_CAPSULE | ORAL | Status: AC
  Administered 2018-04-16: 400 mg via ORAL
  Filled 2018-04-16: qty 1

## 2018-04-16 MED ORDER — RAMELTEON 8 MG PO TABS
8.0000 mg | ORAL_TABLET | Freq: Every day | ORAL | Status: DC
  Administered 2018-04-16 – 2018-04-22 (×7): 8 mg via ORAL
  Filled 2018-04-16 (×9): qty 1

## 2018-04-16 NOTE — Progress Notes (Signed)
Pt reports she hasnt slept any so far tonight and she just received additional sleep medications   She also requested to get food and have her blood pressure taken because she said she was seeing stars   bp 143/93 p 72   Pt encouraged to go to her room and try to go to sleep

## 2018-04-16 NOTE — [facility] Group Notes (Signed)
[place] Group Notes:  (Nursing/MHT/Case Management/Adjunct)  Date:  04/16/2018  Time:  4:00 pm  Type of Therapy:  Psychoeducational Skills  Participation Level:  Active  Participation Quality:  Appropriate  Affect:  Appropriate  Cognitive:  Appropriate  Insight:  Appropriate  Engagement in Group:  Engaged  Modes of Intervention:  Discussion and Education   Patient was alert and active during group. Summary of Progress/Problems:   Cammy Copa 04/16/2018, 5:38 PM

## 2018-04-16 NOTE — Progress Notes (Signed)
D    Pt reports feeling much better since she had a BM after taking a suppository   She had a period of time where she was SOB but received a breathing treatment and said she is much better   She is socialable on the unit and is compliant with treatment   She did complain of poor sleep  A   Verbal support given    Medications administered and effectiveness monitored    Q 15 min checks  R    Pt is safe at this time

## 2018-04-16 NOTE — Progress Notes (Signed)
Pt continues to deny sleeping even though the MHt recorded she slept 5 hours   Pt did admit she was laying still   MHT told her to move her foot or leg so she could properly record pt sleep   Pt did not do that    She also wanted to stay back from breakfast and have a tray brought to her as she said she was starving   Pt ate snacks frequently throughout the night after she got up about 3 times , but was informed that unit rules in place expected her to go to the cafeteria if she is able    After encouragement she decided she would go to the cafeteria for breakfast   She also said that this shift was mean because the other shift lets her stay back frome the cafeteria and will bring her a tray.

## 2018-04-16 NOTE — Progress Notes (Signed)
Patient ID: Sarah Cervantes, female   DOB: [birth year], 37 y.o.   MRN: [id number]  Nursing Progress Note [id number]  Data: Patient presents with complaints of insomnia and anxiety this morning. Patient compliant with scheduled medications. Patient reports a mild headache and requests Tylenol. Patient completed self-inventory sheet and rates depression, hopelessness, and anxiety 5,5,8 respectively. Patient rates their sleep and appetite as poor/good respectively. Patient states goal for today is to "work on attitude, anxiety and calm down". Patient is seen attending groups and visible in the milieu. Patient currently denies SI/HI/AVH.   Action: Patient is educated about and provided medication per provider's orders. Patient safety maintained with q15 min safety checks and frequent rounding. Low fall risk precautions in place. Emotional support given. 1:1 interaction and active listening provided. Patient encouraged to attend meals, groups, and work on treatment plan and goals. Labs, vital signs and patient behavior monitored throughout shift.   Response: Patient remains safe on the unit at this time and agrees to come to staff with any issues/concerns. Patient is interacting with peers appropriately on the unit. Will continue to support and monitor.
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
## 2018-04-16 NOTE — Plan of Care (Signed)
  Problem: Activity: Goal: Interest or engagement in activities will improve Outcome: Progressing   Problem: Health Behavior/Discharge Planning: Goal: Compliance with treatment plan for underlying cause of condition will improve Outcome: Progressing   Problem: Safety: Goal: Periods of time without injury will increase Outcome: Progressing   

## 2018-04-17 MED ORDER — ENALAPRIL MALEATE 5 MG PO TABS
5.0000 mg | ORAL_TABLET | Freq: Every day | ORAL | Status: DC
  Administered 2018-04-17 – 2018-04-23 (×7): 5 mg via ORAL
  Filled 2018-04-17 (×7): qty 1
  Filled 2018-04-17 (×2): qty 7
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
## 2018-04-17 NOTE — Plan of Care (Signed)
  Problem: Activity: Goal: Sleeping patterns will improve Outcome: Progressing Note:  Pt continues to report difficulty sleeping.  Explained to her that she will need to move her legs/wave at staff if she hears [place] coming in.  She agreed to try.

## 2018-04-17 NOTE — Progress Notes (Signed)
Recreation Therapy Notes  Date: 11.20.19 Time: 0930 Location: 300 Hall Dayroom  Group Topic: Stress Management  Goal Area(s) Addresses:  Patient will verbalize importance of using healthy stress management.  Patient will identify positive emotions associated with healthy stress management.   Intervention: Stress Management  Activity :  Guided Imagery.  LRT introduced the stress management technique of guided imagery.  LRT read a script to guide patients through a peaceful meadow.  Patients were to follow along as script was read to engage in activity.  Education:  Stress Management, Discharge Planning.   Education Outcome: Acknowledges edcuation/In group clarification offered/Needs additional education  Clinical Observations/Feedback: Pt did not attend group.    Victorino Sparrow, LRT/CTRS         Victorino Sparrow A 04/17/2018 11:25 AM

## 2018-04-17 NOTE — Progress Notes (Signed)
Patient did attend the evening speaker NA meeting.  

## 2018-04-17 NOTE — [facility] Group Notes (Signed)
[place] Group Notes:  (Nursing/MHT/Case Management/Adjunct)  Date:  04/17/2018  Time:  7:57 PM  Type of Therapy:  Nurse Education  Participation Level:  Active  Participation Quality:  Appropriate and Attentive  Affect:  Appropriate  Cognitive:  Alert and Appropriate  Insight:  Good  Engagement in Group:  Improving  Modes of Intervention:  Discussion and Education  Summary of Progress/Problems: RN led group on "Needs Assessment." We discussed positive self affirmation, the triangle of "emotions," "actions," and "thoughts", and Maslow's hierarchy of needs. We discussed the importance of meeting needs in a healthy manner.   Lesli Albee 04/17/2018, 7:57 PM

## 2018-04-17 NOTE — Progress Notes (Addendum)
Pt presents with a flat affect an anxious mood. Pt reported decreased depression and anxiety today. Pt denies SI/HI. Pt reported difficulty sleeping last night. Pt denies AVH. Pt noted to be less manipulative and irritable today. No behavioral issues noted or reported to Probation officer. Pt expressed wanting to attend substance abuse tx. Pt is considering attending [facility] tx facility for long-term tx.  Medications administered as ordered per MD. Verbal support provided. Pt encouraged to attend groups. 15 minute checks performed for safety. MD made aware of pt elevated b/p. Pt started on an antihypertensive med.   Pt compliant with tx plan.
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
## 2018-04-18 NOTE — Progress Notes (Signed)
Adult Psychoeducational Group Note  Date:  04/18/2018 Time:  10:28 PM  Group Topic/Focus:  Wrap-Up Group:   The focus of this group is to help patients review their daily goal of treatment and discuss progress on daily workbooks.  Participation Level:  Active  Participation Quality:  Appropriate  Affect:  Appropriate  Cognitive:  Appropriate  Insight: Appropriate  Engagement in Group:  Engaged  Modes of Intervention:  Discussion  Additional Comments:  Pt stated that today has been a good day overall. In regards to the quote: " You may have to fight a battle more than once". Pt stated that " If I can overcome my past, then I can look forward to my future".   Wynelle Fanny R 04/18/2018, 10:28 PM

## 2018-04-18 NOTE — Progress Notes (Signed)
Patient ID: Sarah Cervantes, female   DOB: 19-Oct-1980, 37 y.o.   MRN: [id number]  D: Pt denies SI/HI/AVH, and was visible earlier in the shift interacting with her peers in the day room.  Pt also attended the NA meeting which was held earlier in the shift.  Pt denied any concerns.  A: Pt given all of her meds as scheduled, and is being maintained on Q15 minute checks for safety.  R: Will continue to monitor

## 2018-04-18 NOTE — Progress Notes (Signed)
D:  Sarah Cervantes was pleasant and cooperative this evening.  She was up and visible on the unit.  She interacted well with staff and peers.  She denied SI/HI or A/V hallucinations.  She reported that her day was good and voiced no complaints.  She denied any pain or discomfort and appeared to be in no physical distress.  She took her hs medications but declined to take her vistaril because "I don't want to feel groggy in the morning."  She was noted laying in her bed with eyes closed eating gold fish crackers.   A:  1:1 with RN for support and encouragement.  Medications as ordered.  Q 15 minute checks maintained for safety.  Encouraged participation in group and unit activities.   R:  Sarah Cervantes remains safe on the unit.  We will continue to monitor the progress towards her goals.

## 2018-04-18 NOTE — Plan of Care (Signed)
Patient's depression, hopelessness, and anxiety rated 0, 0, 5. Patient want's to work on "attitude." Denies SI HI AVH.   Problem: Coping: Goal: Ability to interact with others will improve Outcome: Progressing Goal: Demonstration of participation in decision-making regarding own care will improve Outcome: Progressing Goal: Ability to use eye contact when communicating with others will improve Outcome: Progressing

## 2018-04-19 NOTE — Progress Notes (Signed)
Patient ID: Sarah Cervantes, female   DOB: 05/17/1981, 37 y.o.   MRN: [id number]  D. Pt presents with a masked affect and cooperative behavior. Pt reports that her goal is to "think b4 act" and "attitude." Pt currently denies SI/HI and AVH and agrees to contact staff before acting on any harmful thoughts.   A. Labs and vitals monitored. Pt given and educated on medications. Pt supported emotionally and encouraged to express concerns and ask questions.    R. Pt remains safe with 15 minute checks. Pt frequently asks for staff assistance. Reports she is focused on discharge. Will continue POC.

## 2018-04-19 NOTE — Tx Team (Signed)
Interdisciplinary Treatment and Diagnostic Plan Update  04/19/2018 Time of Session: [id number] Sarah Cervantes MRN: [id number]  Principal Diagnosis: Bipolar Disorder   Secondary Diagnoses: Principal Problem:   Polysubstance dependence including opioid drug with daily use ([place]) Active Problems:   Bipolar I disorder, current or most recent episode depressed, with psychotic features ([place])   Constipation   Bacterial vaginosis   Current Medications:  Current Facility-Administered Medications  Medication Dose Route Frequency Provider Last Rate Last Dose  . acetaminophen (TYLENOL) tablet 650 mg  650 mg Oral Q4H PRN Lindon Romp A, NP   650 mg at 04/18/18 0804  . albuterol (PROVENTIL HFA;VENTOLIN HFA) 108 (90 Base) MCG/ACT inhaler 2 puff  2 puff Inhalation Q4H PRN Money, Lowry Ram, FNP   2 puff at 04/17/18 2015  . albuterol (PROVENTIL) (2.5 MG/3ML) 0.083% nebulizer solution 2.5 mg  2.5 mg Nebulization Q4H PRN Money, Lowry Ram, FNP   2.5 mg at 04/15/18 2211  . alum & mag hydroxide-simeth (MAALOX/MYLANTA) 200-200-20 MG/5ML suspension 30 mL  30 mL Oral Q4H PRN Lindon Romp A, NP      . ARIPiprazole (ABILIFY) tablet 5 mg  5 mg Oral Daily Pennelope Bracken, MD   5 mg at 04/19/18 0818  . bisacodyl (DULCOLAX) EC tablet 5 mg  5 mg Oral Daily PRN Pennelope Bracken, MD   5 mg at 04/18/18 1339  . docusate sodium (COLACE) capsule 100 mg  100 mg Oral BID Cobos, Myer Peer, MD   100 mg at 04/19/18 0818  . doxycycline (VIBRA-TABS) tablet 100 mg  100 mg Oral Q12H Duffy Bruce, MD   100 mg at 04/19/18 1004  . DULoxetine (CYMBALTA) DR capsule 30 mg  30 mg Oral Daily Lindon Romp A, NP   30 mg at 04/19/18 0818  . enalapril (VASOTEC) tablet 5 mg  5 mg Oral Daily Lindell Spar I, NP   5 mg at 04/19/18 0818  . gabapentin (NEURONTIN) capsule 400 mg  400 mg Oral TID Pennelope Bracken, MD   400 mg at 04/19/18 1204  . hydrOXYzine (ATARAX/VISTARIL) tablet 50 mg  50 mg Oral TID PRN Lindell Spar I, NP    50 mg at 04/19/18 0302  . hydrOXYzine (ATARAX/VISTARIL) tablet 50 mg  50 mg Oral QHS Lindell Spar I, NP   50 mg at 04/17/18 2108  . ibuprofen (ADVIL,MOTRIN) tablet 600 mg  600 mg Oral Q6H PRN Pennelope Bracken, MD   600 mg at 04/18/18 1339  . magnesium hydroxide (MILK OF MAGNESIA) suspension 30 mL  30 mL Oral Daily PRN Lindon Romp A, NP   30 mL at 04/18/18 0951  . menthol-cetylpyridinium (CEPACOL) lozenge 3 mg  1 lozenge Oral PRN Lindon Romp A, NP   3 mg at 04/15/18 1828  . mupirocin cream (BACTROBAN) 2 %   Topical BID Lindon Romp A, NP      . nicotine polacrilex (NICORETTE) gum 2 mg  2 mg Oral PRN Cobos, Myer Peer, MD   2 mg at 04/19/18 1005  . ondansetron (ZOFRAN) tablet 4 mg  4 mg Oral Q8H PRN Lindon Romp A, NP   4 mg at 04/10/18 1156  . prazosin (MINIPRESS) capsule 1 mg  1 mg Oral QHS Lindon Romp A, NP   1 mg at 04/18/18 2119  . ramelteon (ROZEREM) tablet 8 mg  8 mg Oral QHS Pennelope Bracken, MD   8 mg at 04/18/18 2006  . topiramate (TOPAMAX) tablet 100 mg  100 mg Oral QHS [place],  Randa Ngo, MD   100 mg at 04/18/18 2119  . traZODone (DESYREL) tablet 100 mg  100 mg Oral QHS,MR X 1 Pennelope Bracken, MD   100 mg at 04/18/18 2119   PTA Medications: Medications Prior to Admission  Medication Sig Dispense Refill Last Dose  . DULoxetine (CYMBALTA) 30 MG capsule Take 1 capsule (30 mg total) by mouth daily. 30 capsule 0 04/09/2018 at Unknown time  . gabapentin (NEURONTIN) 300 MG capsule Take 1 capsule (300 mg total) by mouth 3 (three) times daily. 45 capsule 1 Past Week at Unknown time  . hydrocortisone (ANUSOL-HC) 2.5 % rectal cream Place rectally 3 (three) times daily. 30 g 0 Past Month at Unknown time  . hydrOXYzine (ATARAX/VISTARIL) 50 MG tablet Take 1 tablet (50 mg total) by mouth every 6 (six) hours as needed for anxiety. 30 tablet 0 Past Week at Unknown time  . prazosin (MINIPRESS) 1 MG capsule Take 1 capsule (1 mg total) by mouth at bedtime. 30 capsule 0  Past Week at Unknown time  . QUEtiapine (SEROQUEL) 100 MG tablet Take 1 tablet (100 mg total) by mouth at bedtime. 30 tablet 0 Past Week at Unknown time  . topiramate (TOPAMAX) 50 MG tablet Take 1 tablet (50 mg total) by mouth at bedtime. 30 tablet 0 Past Week at Unknown time    Patient Stressors: Financial difficulties Medication change or noncompliance Substance abuse  Patient Strengths: Ability for insight Average or above average intelligence Capable of independent living [employer] of knowledge Motivation for treatment/growth  Treatment Modalities: Medication Management, Group therapy, Case management,  1 to 1 session with clinician, Psychoeducation, Recreational therapy.   Physician Treatment Plan for Primary Diagnosis: Bipolar Disorder  Medication Management: Evaluate patient's response, side effects, and tolerance of medication regimen.  Therapeutic Interventions: 1 to 1 sessions, Unit Group sessions and Medication administration.  Evaluation of Outcomes: Progressing  Physician Treatment Plan for Secondary Diagnosis: Principal Problem:   Polysubstance dependence including opioid drug with daily use ([place]) Active Problems:   Bipolar I disorder, current or most recent episode depressed, with psychotic features ([place])   Constipation   Bacterial vaginosis   Medication Management: Evaluate patient's response, side effects, and tolerance of medication regimen.  Therapeutic Interventions: 1 to 1 sessions, Unit Group sessions and Medication administration.  Evaluation of Outcomes: Progressing   RN Treatment Plan for Primary Diagnosis: Bipolar Disorder  Long Term Goal(s): Knowledge of disease and therapeutic regimen to maintain health will improve  Short Term Goals: Ability to remain free from injury will improve, Ability to verbalize feelings will improve and Ability to disclose and discuss suicidal ideas  Medication Management: RN will administer medications as ordered  by provider, will assess and evaluate patient's response and provide education to patient for prescribed medication. RN will report any adverse and/or side effects to prescribing provider.  Therapeutic Interventions: 1 on 1 counseling sessions, Psychoeducation, Medication administration, Evaluate responses to treatment, Monitor vital signs and CBGs as ordered, Perform/monitor CIWA, COWS, AIMS and Fall Risk screenings as ordered, Perform wound care treatments as ordered.  Evaluation of Outcomes: Progressing   LCSW Treatment Plan for Primary Diagnosis: Bipolar Disorder  Long Term Goal(s): Safe transition to appropriate next level of care at discharge, Engage patient in therapeutic group addressing interpersonal concerns.  Short Term Goals: Engage patient in aftercare planning with referrals and resources, Facilitate patient progression through stages of change regarding substance use diagnoses and concerns and Identify triggers associated with mental health/substance abuse issues  Therapeutic  Interventions: Assess for all discharge needs, 1 to 1 time with Education officer, museum, Explore available resources and support systems, Assess for adequacy in community support network, Educate family and significant other(s) on suicide prevention, Complete Psychosocial Assessment, Interpersonal group therapy.  Evaluation of Outcomes: Progressing   Progress in Treatment: Attending groups: No.   Participating in groups: No. Taking medication as prescribed: Yes. Toleration medication: Yes. Family/Significant other contact made: No, will contact:  family member if pt consents to collateral contact.  Patient understands diagnosis: Yes. Discussing patient identified problems/goals with staff: Yes. Medical problems stabilized or resolved: Yes. Denies suicidal/homicidal ideation: Yes. Issues/concerns per patient self-inventory: No. Other: n/a  New problem(s) identified: No, Describe:  n/a  New Short Term/Long  Term Goal(s): detox, medication management for mood stabilization; elimination of SI thoughts; development of comprehensive mental wellness/sobriety plan.   Patient Goals:  "I need better coping skills and I have to learn how to live sober."   Discharge Plan or Barriers: CSW assessing--pt no longer eligible for [facility]. Pt requesting [facility] referral. [facility] pamphlet, Mobile Crisis information, and AA/NA information provided to patient for additional community support and resources.   Reason for Continuation of Hospitalization: Anxiety Depression Medication stabilization Suicidal ideation Withdrawal symptoms  Estimated Length of Stay: Monday, 04/15/18  Attendees: Patient: 04/19/2018 2:35 PM  Physician: Dr. Nancy Fetter MD 04/19/2018 2:35 PM  Nursing: Estill Bamberg RN; Chrys Racer RN; [place].Raliegh Ip, RN; Clarise Cruz.Carlean Jews, RN  04/19/2018 2:35 PM  RN Care Manager:Jennifer Carlis Abbott, RN 04/19/2018 2:35 PM  Social Worker: Janice Norrie LCSW; Radonna Ricker, [place] 04/19/2018 2:35 PM  Recreational Therapist: x 04/19/2018 2:35 PM  Other: Lindell Spar NP 04/19/2018 2:35 PM  Other:  04/19/2018 2:35 PM  Other: 04/19/2018 2:35 PM    Scribe for Treatment Team: Marylee Floras, Abbott 04/19/2018 2:35 PM

## 2018-04-19 NOTE — Progress Notes (Signed)
Recreation Therapy Notes  Date: 04/19/18 Time: 0930 Location: 300 Hall Dayroom  Group Topic: Stress Management  Goal Area(s) Addresses:  Patient will verbalize importance of using healthy stress management.  Patient will identify positive emotions associated with healthy stress management.   Behavioral Response: Engaged  Intervention: Stress Management  Activity :  Meditation.  LRT introduced the stress management technique of meditation to the patients.  LRT played a meditation that focused on being resilient in the face of challenge.  Patients were to listen and follow along as the meditation was played to engage in activity.  Education:  Stress Management, Discharge Planning.   Education Outcome: Acknowledges edcuation/In group clarification offered/Needs additional education  Clinical Observations/Feedback: Pt attended and participated in activity.     Victorino Sparrow, LRT/CTRS         Victorino Sparrow A 04/19/2018 12:36 PM
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
## 2018-04-20 MED ORDER — POLYETHYLENE GLYCOL 3350 17 G PO PACK
17.0000 g | PACK | Freq: Every day | ORAL | Status: DC
  Administered 2018-04-20 – 2018-04-22 (×3): 17 g via ORAL
  Filled 2018-04-20 (×6): qty 1

## 2018-04-20 MED ORDER — HYDROXYZINE HCL 25 MG PO TABS
25.0000 mg | ORAL_TABLET | Freq: Every day | ORAL | Status: DC
  Administered 2018-04-21 – 2018-04-22 (×3): 25 mg via ORAL
  Filled 2018-04-20 (×5): qty 1

## 2018-04-20 MED ORDER — HYDROXYZINE HCL 25 MG PO TABS
25.0000 mg | ORAL_TABLET | ORAL | Status: AC
  Administered 2018-04-20: 25 mg via ORAL
  Filled 2018-04-20 (×2): qty 1

## 2018-04-20 NOTE — [facility] Counselor (Signed)
Clinical Social Work Note  Met with pt because of anxiety she is experiencing over upcoming admission to [facility].  She fears she will be arrested when the police come to take her to [facility], rather than actually being transported to treatment.  She is also afraid they will not be nice to her at the facility, and they will not set her up with an aftercare plan.  We discussed these things and she felt calmer.  We also looked at some potential follow-up that her [facility] care team will likely share with her while she is there.  Selmer Dominion, LCSW 04/20/2018, 5:40 PM

## 2018-04-20 NOTE — Plan of Care (Signed)
  Problem: Coping: Goal: Ability to identify and develop effective coping behavior will improve Outcome: Progressing   

## 2018-04-20 NOTE — Progress Notes (Signed)
[facility] MD Progress Note  04/20/2018 12:05 PM Sarah Cervantes  MRN:  [id number]   Evaluation: Sarah Cervantes observe sitting in day room interacting with peers.  Presents pleasant calm and cooperative.  Denies homicidal or suicidal ideations on this assessment.  Patient reports feeling better overall and states she is looking forward to attending [facility] on Monday.  Patient denies depression or depressive symptoms.  Patient does have concerns with ongoing constipation and has requests MiraLAX for symptoms.  Patient reports she feels the Vistaril 50 mg has been too sedating throughout the day.  Discussed titrating Vistaril to 25 mg p.o. as needed nightly patient was agreeable to plan.  Reports attending daily group sessions.  Reports taking medications as prescribed and tolerating them well.  Support, encouragement reassurance was provided.  History: Per assessment note-Sarah Cervantes is a 37 y/o F with history of Bipolar I, PTSD, and polysubstance aubse who was admitted voluntarily from [place] where she presented via EMS with worsening depression, SI, suicide attempt via overdose of her prescription medications and multiple illicit substances including alcohol, heroin, and cocaine. Pt has recent relevant history of discharge from [facility] on 04/03/18 directly to [facility], and pt indicated in ED that she had left after 2 days and relapsed on multiple illicit substances. Pt was medically cleared and then started on CIWA and her previous discharge medications. She was then transferred to [facility] for additional treatment and stabilization.   Principal Problem: Polysubstance dependence including opioid drug with daily use (HCC) Diagnosis: Principal Problem:   Polysubstance dependence including opioid drug with daily use (HCC) Active Problems:   Bipolar I disorder, current or most recent episode depressed, with psychotic features ([place])   Constipation   Bacterial vaginosis  Total Time  spent with patient: 15 minutes  Past Psychiatric History: see H&P  Past Medical History:  Past Medical History:  Diagnosis Date  . Cocaine abuse, daily use ([place]) 03/23/2018  . ETOH abuse 03/23/2018  . Heroin abuse ([place]) 03/23/2018   History reviewed. No pertinent surgical history. Family History: History reviewed. No pertinent family history. Family Psychiatric  History: see H&P Social History:  Social History   Substance and Sexual Activity  Alcohol Use Yes  . Alcohol/week: 12.0 - 15.0 standard drinks  . Types: 12 - 15 Cans of beer per week   Comment: drinking daily for the past week     Social History   Substance and Sexual Activity  Drug Use Yes  . Types: Cocaine   Comment: Heroin and Cocain on 2018/03/23. Pt uses every night    Social History   Socioeconomic History  . Marital status: Single    Spouse name: Not on file  . Number of children: Not on file  . Years of education: Not on file  . Highest education level: Not on file  Occupational History  . Not on file  Social Needs  . Financial resource strain: Not on file  . Food insecurity:    Worry: Not on file    Inability: Not on file  . Transportation needs:    Medical: Not on file    Non-medical: Not on file  Tobacco Use  . Smoking status: Current Every Day Smoker    Packs/day: 0.50    Years: 30.00    Pack years: 15.00  . Smokeless tobacco: Never Used  Substance and Sexual Activity  . Alcohol use: Yes    Alcohol/week: 12.0 - 15.0 standard drinks    Types: 12 - 15 Cans  of beer per week    Comment: drinking daily for the past week  . Drug use: Yes    Types: Cocaine    Comment: Heroin and Cocain on 2018/03/23. Pt uses every night  . Sexual activity: Yes    Birth control/protection: None  Lifestyle  . Physical activity:    Days per week: Not on file    Minutes per session: Not on file  . Stress: Not on file  Relationships  . Social connections:    Talks on phone: Not on file    Gets together:  Not on file    Attends religious service: Not on file    Active member of club or organization: Not on file    Attends meetings of clubs or organizations: Not on file    Relationship status: Not on file  Other Topics Concern  . Not on file  Social History Narrative  . Not on file   Additional Social History:    Pain Medications: See med list Prescriptions: See med list Over the Counter: See med list History of alcohol / drug use?: Yes Longest period of sobriety (when/how long): "3 weeks" Negative Consequences of Use: Personal relationships, Financial, Work / School Name of Substance 1: cocaine 1 - Age of First Use: teens 1 - Amount (size/oz): varies 1 - Frequency: several times a week 1 - Duration: ongoing 1 - Last Use / Amount: 04/09/18 Name of Substance 2: heroin 2 - Age of First Use: 20's 2 - Amount (size/oz): varies 2 - Frequency: occasional 2 - Duration: ongoing 2 - Last Use / Amount: 1/4 gm 04/09/18 Name of Substance 3: ETOH 3 - Age of First Use: teens 3 - Amount (size/oz): varies 3 - Frequency: daily for over a week 3 - Duration: ongoing 3 - Last Use / Amount: 04/09/18              Sleep: Good  Appetite:  Good  Current Medications: Current Facility-Administered Medications  Medication Dose Route Frequency Provider Last Rate Last Dose  . acetaminophen (TYLENOL) tablet 650 mg  650 mg Oral Q4H PRN Lindon Romp A, NP   650 mg at 04/18/18 0804  . albuterol (PROVENTIL HFA;VENTOLIN HFA) 108 (90 Base) MCG/ACT inhaler 2 puff  2 puff Inhalation Q4H PRN Money, Lowry Ram, FNP   2 puff at 04/20/18 0728  . albuterol (PROVENTIL) (2.5 MG/3ML) 0.083% nebulizer solution 2.5 mg  2.5 mg Nebulization Q4H PRN Money, Lowry Ram, FNP   2.5 mg at 04/15/18 2211  . alum & mag hydroxide-simeth (MAALOX/MYLANTA) 200-200-20 MG/5ML suspension 30 mL  30 mL Oral Q4H PRN Lindon Romp A, NP      . ARIPiprazole (ABILIFY) tablet 5 mg  5 mg Oral Daily Pennelope Bracken, MD   5 mg at  04/20/18 0831  . docusate sodium (COLACE) capsule 100 mg  100 mg Oral BID Cobos, Myer Peer, MD   100 mg at 04/20/18 0829  . doxycycline (VIBRA-TABS) tablet 100 mg  100 mg Oral Q12H Duffy Bruce, MD   100 mg at 04/20/18 0830  . DULoxetine (CYMBALTA) DR capsule 30 mg  30 mg Oral Daily Lindon Romp A, NP   30 mg at 04/20/18 0830  . enalapril (VASOTEC) tablet 5 mg  5 mg Oral Daily Nwoko, Agnes I, NP   5 mg at 04/20/18 0830  . gabapentin (NEURONTIN) capsule 400 mg  400 mg Oral TID Pennelope Bracken, MD   400 mg at 04/20/18 0830  . hydrOXYzine (  ATARAX/VISTARIL) tablet 25 mg  25 mg Oral QHS Derrill Center, NP      . hydrOXYzine (ATARAX/VISTARIL) tablet 50 mg  50 mg Oral TID PRN Lindell Spar I, NP   50 mg at 04/19/18 1824  . ibuprofen (ADVIL,MOTRIN) tablet 600 mg  600 mg Oral Q6H PRN Pennelope Bracken, MD   600 mg at 04/20/18 0835  . magnesium hydroxide (MILK OF MAGNESIA) suspension 30 mL  30 mL Oral Daily PRN Lindon Romp A, NP   30 mL at 04/18/18 0951  . menthol-cetylpyridinium (CEPACOL) lozenge 3 mg  1 lozenge Oral PRN Lindon Romp A, NP   3 mg at 04/15/18 1828  . mupirocin cream (BACTROBAN) 2 %   Topical BID Lindon Romp A, NP      . nicotine polacrilex (NICORETTE) gum 2 mg  2 mg Oral PRN Cobos, Myer Peer, MD   2 mg at 04/20/18 0835  . ondansetron (ZOFRAN) tablet 4 mg  4 mg Oral Q8H PRN Lindon Romp A, NP   4 mg at 04/10/18 1156  . polyethylene glycol (MIRALAX / GLYCOLAX) packet 17 g  17 g Oral Daily Ricky Ala N, NP      . prazosin (MINIPRESS) capsule 1 mg  1 mg Oral QHS Lindon Romp A, NP   1 mg at 04/19/18 2150  . ramelteon (ROZEREM) tablet 8 mg  8 mg Oral QHS Pennelope Bracken, MD   8 mg at 04/19/18 2007  . topiramate (TOPAMAX) tablet 100 mg  100 mg Oral QHS Pennelope Bracken, MD   100 mg at 04/19/18 2151  . traZODone (DESYREL) tablet 100 mg  100 mg Oral QHS,MR X 1 Pennelope Bracken, MD   100 mg at 04/19/18 2151    Lab Results: No results found for  this or any previous visit (from the past 16 hour(s)).  Blood Alcohol level:  Lab Results  Component Value Date   ETH <10 04/09/2018   ETH <10 56/25/6389    Metabolic Disorder Labs: Lab Results  Component Value Date   HGBA1C 5.6 03/24/2018   MPG 114.02 03/24/2018   No results found for: PROLACTIN No results found for: CHOL, TRIG, HDL, CHOLHDL, VLDL, LDLCALC  Physical Findings: AIMS: Facial and Oral Movements Muscles of Facial Expression: None, normal Lips and Perioral Area: None, normal Jaw: None, normal Tongue: None, normal,Extremity Movements Upper (arms, wrists, hands, fingers): None, normal Lower (legs, knees, ankles, toes): None, normal, Trunk Movements Neck, shoulders, hips: None, normal, Overall Severity Severity of abnormal movements (highest score from questions above): None, normal Incapacitation due to abnormal movements: None, normal Patient's awareness of abnormal movements (rate only patient's report): No Awareness, Dental Status Current problems with teeth and/or dentures?: No Does patient usually wear dentures?: No  CIWA:  CIWA-Ar Total: 2 COWS:  COWS Total Score: 2  Musculoskeletal: Strength & Muscle Tone: within normal limits Gait & Station: normal Patient leans: N/A  Psychiatric Specialty Exam: Physical Exam  Nursing note and vitals reviewed. Cardiovascular: Normal rate.  Neurological: She is alert.  Psychiatric: She has a normal mood and affect.    Review of Systems  Psychiatric/Behavioral: Negative for depression, hallucinations and suicidal ideas. The patient is not nervous/anxious and does not have insomnia.   All other systems reviewed and are negative.   Blood pressure (!) 142/104, pulse 81, temperature 97.9 F (36.6 C), resp. rate 18, height 5\' 4"  (1.626 m), weight (!) 143.7 kg, last menstrual period 03/23/2018, SpO2 97 %.Body mass index is 54.38  kg/m.  General Appearance: Casual and Fairly Groomed  Eye Contact:  Good  Speech:  Clear  and Coherent and Normal Rate  Volume:  Normal  Mood:  Euthymic  Affect:  Appropriate and Congruent  Thought Process:  Coherent and Goal Directed  Orientation:  Full (Time, Place, and Person)  Thought Content:  Logical  Suicidal Thoughts:  No  Homicidal Thoughts:  No  Memory:  Immediate;   Fair Recent;   Fair Remote;   Fair  Judgement:  Fair  Insight:  Fair  Psychomotor Activity:  Normal  Concentration:  Concentration: Fair  Recall:  [employer] of Knowledge:  Fair  Language:  Fair  Akathisia:  No  Handed:    AIMS (if indicated):     Assets:  Resilience  ADL's:  Intact  Cognition:  WNL  Sleep:  Number of Hours: 4.75   Treatment Plan Summary: Daily contact with patient to assess and evaluate symptoms and progress in treatment and Medication management   Continue with current treatment plan on 04/20/2018 as listed below except where noted  -Bipolar I current episode depressed with psychotic features and PTSD -Continueabilify 5mg  po q Day -Continue cymabalta 30mg  po qDay  -Anxiety -Continue gabapentin 400 mg po TID -Continue  vistaril 50 mg to 25 mg  po q8h prn anxiety  -Polysubstance dependence -Continue SW referral process to residential substance use treatment  -Alcohol withdrawal -Completed CIWA with ativan taper.   -Asthma             -Continue albuterol 108 mcg/ACT inhaler take 2 puffs q4h prn wheeze/SOB             -Continue alubterol 0.083% nebulizer, take 2.5mg  via nebulization q4h prn severe SOB/wheeze  -Nightmares associated with PTSD -Continue prazosin 1mg  po qhs  -Bacterial bronchitis             -Continue doxycycline 100mg  po q12h for 7 days total  -Bacterial vaginosis             -Continue metronidazole 500mg  po q12h for 5 days total   -constipation -Continue bisacodyl 5mg  po qDay prn moderate constipation             -Continue colace 100mg   po BID  -insomnia -Discontinuedmelatonin 5mg  po qhs (at 2000)             -Continue rozerem 8mg  po qhs (at 2000) -Continue trazodone 100mg  po qhs (may repeat x1 prn insomnia)             - Decreased  vistaril 50 mg to 25 mg po qhs  -migraine -Continue topamax 100mg  po qhs.  HTN.            -Continue Vasotec 5 mg po daily  -L knee injury/fall -3-view L knee X-Ray (completed = no acute injury observed) -Continue ibuprofen 600 mg po q6h prn moderate pain -Continue acetaminophen 650mg  po q4h prn mild pain  -Encourage participation in groups and therapeutic milieu -Disposition planning will be ongoing  Derrill Center, NP 04/20/2018, 12:05 PM

## 2018-04-20 NOTE — Progress Notes (Signed)
The patient attended the evening A.A.meeting and was appropriate.  

## 2018-04-20 NOTE — Progress Notes (Signed)
[place] Group Notes:  (Nursing/MHT/Case Management/Adjunct)  Date:  04/20/2018  Time: 1300 Type of Therapy:  Nurse Marden Noble, RN.  Participation Level:  Active  Participation Quality:  Appropriate  Affect:  Appropriate  Cognitive:  Appropriate  Insight:  Appropriate  Engagement in Group:  Engaged  Modes of Intervention:  Discussion and Education

## 2018-04-20 NOTE — Progress Notes (Signed)
D Pt is observed OOB UAL on the 300 hall today. SHe tolerates this fairly well. She admits she feels quite " anxious about going to the [employer] on [address]...ibuprofen know this is the right thing for me to do..ibuprofen get  A 2nd chance" and writer offers encourage,ment, which she appreciates. She is depressed, anxious and worried about her futrure.     A She completed her daily assessment and on this she wrote she denied SI and she rated her depression, hopelessness and anxiety "0/0/0/", respectively. She shares what her plans are, in terms of starting her life over and she is excited about beginning new in a new city.She says " I'm ready to start over".     R Safety is in place.

## 2018-04-20 NOTE — [facility] Group Notes (Signed)
[place] LCSW Group Therapy Note  Date/Time: 04/20/18, 1000  Type of Therapy/Topic:  Group Therapy:  Balance in Life  Participation Level:  active  Description of Group:    This group will address the concept of balance and how it feels and looks when one is unbalanced. Patients will be encouraged to process areas in their lives that are out of balance, and identify reasons for remaining unbalanced. Facilitators will guide patients utilizing problem- solving interventions to address and correct the stressor making their life unbalanced. Understanding and applying boundaries will be explored and addressed for obtaining  and maintaining a balanced life. Patients will be encouraged to explore ways to assertively make their unbalanced needs known to significant others in their lives, using other group members and facilitator for support and feedback.  Therapeutic Goals: 1. Patient will identify two or more emotions or situations they have that consume much of in their lives. 2. Patient will identify signs/triggers that life has become out of balance:  3. Patient will identify two ways to set boundaries in order to achieve balance in their lives:  4. Patient will demonstrate ability to communicate their needs through discussion and/or role plays  Summary of Patient Progress:Pt shared that substances, friends and family are areas that are currently out of balance in her life.  Pt active during discussion about identifying these areas and using boundaries to improve the balance in life. Good participation.           Therapeutic Modalities:   Cognitive Behavioral Therapy Solution-Focused Therapy Assertiveness Training  Lurline Idol, [place]

## 2018-04-20 NOTE — Progress Notes (Signed)
D:  Sarah Cervantes was pleasant and cooperative this evening.  She was noted resting quietly in her room at beginning of the shift but did get up to attend evening AA group.  She interacted well with staff and peers.  She denied SI/HI or A/V hallucinations.  She declined taking the Vistaril 50mg  with her hs medications again this evening but did wake up at 145am wanting something else to help her sleep.  She was afraid to take 50mg  Vistaril for fear of feeling groggy in the morning.  Staffed with Frederico Hamman, PA and order obtained for 25mg  vistaril one time dose which she took without difficulty.  Encouraged her to talk with the doctor tomorrow about her grogginess concerns.   A:  1:1 with RN for support and encouragement.  Medications as ordered.  Q 15 minute checks maintained for safety.  Encouraged participation in group and unit activities.   R:  Sarah Cervantes remains safe on the unit.  We will continue to monitor the progress towards her goals.

## 2018-04-21 MED ORDER — FLUCONAZOLE 100 MG PO TABS
150.0000 mg | ORAL_TABLET | Freq: Every day | ORAL | Status: DC
  Administered 2018-04-21: 150 mg via ORAL
  Filled 2018-04-21 (×3): qty 1.5

## 2018-04-21 NOTE — Progress Notes (Signed)
[facility] MD Progress Note  04/21/2018 9:44 AM Sarah Cervantes  MRN:  [id number]   Evaluation: Sarah Cervantes resting in bed.  Patient reports feeling" slightly under the weather" this morning.  Anabia present with multiple concerns, states headache pain rates there pain  5 out of 10 with 10 being the worst.  States she just took pain medication to help with her symptoms.  of note blood pressure slightly elevated however patient was just administered hypertension medications repeat vitals in 1 hour.   Patient has concerns with vaginal discharge white and thick slightly odor. Reports she feels that she is experiencing a yeast infection due to antibiotics that she is currently taking.  Urine culture was ordered orders placed for 150 Diflucan for reported symptoms.  Denies suicidal or homicidal ideations.  Denies auditory visual hallucinations. Support encouragement and reassurance was provided  History: Per assessment note-Sarah Cervantes is a 37 y/o F with history of Bipolar I, PTSD, and polysubstance aubse who was admitted voluntarily from [facility] where she presented via EMS with worsening depression, SI, suicide attempt via overdose of her prescription medications and multiple illicit substances including alcohol, heroin, and cocaine. Pt has recent relevant history of discharge from [facility] on 04/03/18 directly to [facility], and pt indicated in ED that she had left after 2 days and relapsed on multiple illicit substances. Pt was medically cleared and then started on CIWA and her previous discharge medications. She was then transferred to [facility] for additional treatment and stabilization.   Principal Problem: Polysubstance dependence including opioid drug with daily use (HCC) Diagnosis: Principal Problem:   Polysubstance dependence including opioid drug with daily use (HCC) Active Problems:   Bipolar I disorder, current or most recent episode depressed, with psychotic features  ([place])   Constipation   Bacterial vaginosis  Total Time spent with patient: 15 minutes  Past Psychiatric History: see H&P  Past Medical History:  Past Medical History:  Diagnosis Date  . Cocaine abuse, daily use ([place]) 03/23/2018  . ETOH abuse 03/23/2018  . Heroin abuse ([place]) 03/23/2018   History reviewed. No pertinent surgical history. Family History: History reviewed. No pertinent family history. Family Psychiatric  History: see H&P Social History:  Social History   Substance and Sexual Activity  Alcohol Use Yes  . Alcohol/week: 12.0 - 15.0 standard drinks  . Types: 12 - 15 Cans of beer per week   Comment: drinking daily for the past week     Social History   Substance and Sexual Activity  Drug Use Yes  . Types: Cocaine   Comment: Heroin and Cocain on 2018/03/23. Pt uses every night    Social History   Socioeconomic History  . Marital status: Single    Spouse name: Not on file  . Number of children: Not on file  . Years of education: Not on file  . Highest education level: Not on file  Occupational History  . Not on file  Social Needs  . Financial resource strain: Not on file  . Food insecurity:    Worry: Not on file    Inability: Not on file  . Transportation needs:    Medical: Not on file    Non-medical: Not on file  Tobacco Use  . Smoking status: Current Every Day Smoker    Packs/day: 0.50    Years: 30.00    Pack years: 15.00  . Smokeless tobacco: Never Used  Substance and Sexual Activity  . Alcohol use: Yes    Alcohol/week: 12.0 -  15.0 standard drinks    Types: 12 - 15 Cans of beer per week    Comment: drinking daily for the past week  . Drug use: Yes    Types: Cocaine    Comment: Heroin and Cocain on 2018/03/23. Pt uses every night  . Sexual activity: Yes    Birth control/protection: None  Lifestyle  . Physical activity:    Days per week: Not on file    Minutes per session: Not on file  . Stress: Not on file  Relationships  . Social  connections:    Talks on phone: Not on file    Gets together: Not on file    Attends religious service: Not on file    Active member of club or organization: Not on file    Attends meetings of clubs or organizations: Not on file    Relationship status: Not on file  Other Topics Concern  . Not on file  Social History Narrative  . Not on file   Additional Social History:    Pain Medications: See med list Prescriptions: See med list Over the Counter: See med list History of alcohol / drug use?: Yes Longest period of sobriety (when/how long): "3 weeks" Negative Consequences of Use: Personal relationships, Financial, Work / School Name of Substance 1: cocaine 1 - Age of First Use: teens 1 - Amount (size/oz): varies 1 - Frequency: several times a week 1 - Duration: ongoing 1 - Last Use / Amount: 04/09/18 Name of Substance 2: heroin 2 - Age of First Use: 20's 2 - Amount (size/oz): varies 2 - Frequency: occasional 2 - Duration: ongoing 2 - Last Use / Amount: 1/4 gm 04/09/18 Name of Substance 3: ETOH 3 - Age of First Use: teens 3 - Amount (size/oz): varies 3 - Frequency: daily for over a week 3 - Duration: ongoing 3 - Last Use / Amount: 04/09/18              Sleep: Good  Appetite:  Good  Current Medications: Current Facility-Administered Medications  Medication Dose Route Frequency Provider Last Rate Last Dose  . acetaminophen (TYLENOL) tablet 650 mg  650 mg Oral Q4H PRN Lindon Romp A, NP   650 mg at 04/18/18 0804  . albuterol (PROVENTIL HFA;VENTOLIN HFA) 108 (90 Base) MCG/ACT inhaler 2 puff  2 puff Inhalation Q4H PRN Money, Lowry Ram, FNP   2 puff at 04/20/18 0728  . albuterol (PROVENTIL) (2.5 MG/3ML) 0.083% nebulizer solution 2.5 mg  2.5 mg Nebulization Q4H PRN Money, Lowry Ram, FNP   2.5 mg at 04/15/18 2211  . alum & mag hydroxide-simeth (MAALOX/MYLANTA) 200-200-20 MG/5ML suspension 30 mL  30 mL Oral Q4H PRN Lindon Romp A, NP      . ARIPiprazole (ABILIFY) tablet 5 mg   5 mg Oral Daily Pennelope Bracken, MD   5 mg at 04/21/18 0752  . docusate sodium (COLACE) capsule 100 mg  100 mg Oral BID Cobos, Myer Peer, MD   100 mg at 04/21/18 0752  . DULoxetine (CYMBALTA) DR capsule 30 mg  30 mg Oral Daily Lindon Romp A, NP   30 mg at 04/21/18 0752  . enalapril (VASOTEC) tablet 5 mg  5 mg Oral Daily Lindell Spar I, NP   5 mg at 04/21/18 0752  . fluconazole (DIFLUCAN) tablet 150 mg  150 mg Oral Daily Lewis, Tanika N, NP      . gabapentin (NEURONTIN) capsule 400 mg  400 mg Oral TID Pennelope Bracken, MD  400 mg at 04/21/18 0752  . hydrOXYzine (ATARAX/VISTARIL) tablet 25 mg  25 mg Oral QHS Derrill Center, NP   25 mg at 04/21/18 0051  . hydrOXYzine (ATARAX/VISTARIL) tablet 50 mg  50 mg Oral TID PRN Lindell Spar I, NP   50 mg at 04/19/18 1824  . ibuprofen (ADVIL,MOTRIN) tablet 600 mg  600 mg Oral Q6H PRN Pennelope Bracken, MD   600 mg at 04/21/18 0752  . magnesium hydroxide (MILK OF MAGNESIA) suspension 30 mL  30 mL Oral Daily PRN Lindon Romp A, NP   30 mL at 04/18/18 0951  . menthol-cetylpyridinium (CEPACOL) lozenge 3 mg  1 lozenge Oral PRN Lindon Romp A, NP   3 mg at 04/15/18 1828  . mupirocin cream (BACTROBAN) 2 %   Topical BID Lindon Romp A, NP      . nicotine polacrilex (NICORETTE) gum 2 mg  2 mg Oral PRN Cobos, Myer Peer, MD   2 mg at 04/20/18 2006  . ondansetron (ZOFRAN) tablet 4 mg  4 mg Oral Q8H PRN Lindon Romp A, NP   4 mg at 04/10/18 1156  . polyethylene glycol (MIRALAX / GLYCOLAX) packet 17 g  17 g Oral Daily Derrill Center, NP   17 g at 04/21/18 0752  . prazosin (MINIPRESS) capsule 1 mg  1 mg Oral QHS Lindon Romp A, NP   1 mg at 04/20/18 2128  . ramelteon (ROZEREM) tablet 8 mg  8 mg Oral QHS Pennelope Bracken, MD   8 mg at 04/20/18 2005  . topiramate (TOPAMAX) tablet 100 mg  100 mg Oral QHS Pennelope Bracken, MD   100 mg at 04/20/18 2128  . traZODone (DESYREL) tablet 100 mg  100 mg Oral QHS,MR X 1 Pennelope Bracken, MD   100 mg at 04/21/18 0050    Lab Results: No results found for this or any previous visit (from the past 48 hour(s)).  Blood Alcohol level:  Lab Results  Component Value Date   ETH <10 04/09/2018   ETH <10 07/62/2633    Metabolic Disorder Labs: Lab Results  Component Value Date   HGBA1C 5.6 03/24/2018   MPG 114.02 03/24/2018   No results found for: PROLACTIN No results found for: CHOL, TRIG, HDL, CHOLHDL, VLDL, LDLCALC  Physical Findings: AIMS: Facial and Oral Movements Muscles of Facial Expression: None, normal Lips and Perioral Area: None, normal Jaw: None, normal Tongue: None, normal,Extremity Movements Upper (arms, wrists, hands, fingers): None, normal Lower (legs, knees, ankles, toes): None, normal, Trunk Movements Neck, shoulders, hips: None, normal, Overall Severity Severity of abnormal movements (highest score from questions above): None, normal Incapacitation due to abnormal movements: None, normal Patient's awareness of abnormal movements (rate only patient's report): No Awareness, Dental Status Current problems with teeth and/or dentures?: No Does patient usually wear dentures?: No  CIWA:  CIWA-Ar Total: 2 COWS:  COWS Total Score: 2  Musculoskeletal: Strength & Muscle Tone: within normal limits Gait & Station: normal Patient leans: N/A  Psychiatric Specialty Exam: Physical Exam  Nursing note and vitals reviewed. Constitutional: She appears well-developed.  Cardiovascular: Normal rate.  Neurological: She is alert.  Psychiatric: She has a normal mood and affect.    Review of Systems  Psychiatric/Behavioral: Negative for depression, hallucinations and suicidal ideas. The patient is not nervous/anxious and does not have insomnia.   All other systems reviewed and are negative.   Blood pressure (!) 165/105, pulse 80, temperature 97.9 F (36.6 C), resp. rate 18, height  5\' 4"  (1.626 m), weight (!) 143.7 kg, last menstrual period 03/23/2018, SpO2 97  %.Body mass index is 54.38 kg/m.  General Appearance: Casual and Fairly Groomed  Eye Contact:  Good  Speech:  Clear and Coherent and Normal Rate  Volume:  Normal  Mood:  Euthymic  Affect:  Appropriate and Congruent  Thought Process:  Coherent and Goal Directed  Orientation:  Full (Time, Place, and Person)  Thought Content:  Logical  Suicidal Thoughts:  No  Homicidal Thoughts:  No  Memory:  Immediate;   Fair Recent;   Fair Remote;   Fair  Judgement:  Fair  Insight:  Fair  Psychomotor Activity:  Normal  Concentration:  Concentration: Fair  Recall:  [employer] of Knowledge:  Fair  Language:  Fair  Akathisia:  No  Handed:    AIMS (if indicated):     Assets:  Resilience  ADL's:  Intact  Cognition:  WNL  Sleep:  Number of Hours: 6   Treatment Plan Summary: Daily contact with patient to assess and evaluate symptoms and progress in treatment and Medication management   Continue with current treatment plan on 04/21/2018 as listed below except where noted  -Bipolar I current episode depressed with psychotic features and PTSD -Continueabilify 5mg  po q Day -Continue cymabalta 30mg  po qDay  -Anxiety -Continue gabapentin 400 mg po TID -Continue  vistaril 50 mg to 25 mg  po q8h prn anxiety  -Polysubstance dependence -Continue SW referral process to residential substance use treatment  -Alcohol withdrawal -Completed CIWA with ativan taper.   -Asthma             -Continue albuterol 108 mcg/ACT inhaler take 2 puffs q4h prn wheeze/SOB             -Continue alubterol 0.083% nebulizer, take 2.5mg  via nebulization q4h prn severe SOB/wheeze  -Nightmares associated with PTSD -Continue prazosin 1mg  po qhs  -Bacterial bronchitis             -Continue doxycycline 100mg  po q12h for 7 days total  -Bacterial vaginosis             -Continue metronidazole 500mg  po q12h for 5 days total    -constipation -Continue bisacodyl 5mg  po qDay prn moderate constipation             -Continue colace 100mg  po BID  -insomnia -Discontinuedmelatonin 5mg  po qhs (at 2000)             -Continue rozerem 8mg  po qhs (at 2000) -Continue trazodone 100mg  po qhs (may repeat x1 prn insomnia)             -Continue vistaril 50 mg to 25 mg po qhs  -migraine -Continue topamax 100mg  po qhs.  HTN.            -Continue Vasotec 5 mg po daily  -L knee injury/fall -3-view L knee X-Ray (completed = no acute injury observed) -Continue ibuprofen 600 mg po q6h prn moderate pain -Continue acetaminophen 650mg  po q4h prn mild pain  -Encourage participation in groups and therapeutic milieu -Disposition planning will be ongoing  Derrill Center, NP 04/21/2018, 9:44 AM

## 2018-04-21 NOTE — Plan of Care (Signed)
D: Patient is in hallway on approach. Patient is alert, oriented, pleasant, and cooperative. Patient denies SI, HI, AVH, and verbally contracts for safety. Patient did not attend evening group. Patient reports there is a patient making her "pissed off because he thinks he controls the tv". Patient denies physical symptoms/pain. Patient reports having a plan for discharge.   Patient woke at 0050 requesting medication to help her get back to sleep. Scheduled medication provided for sleep per MD order.     A: Scheduled medications administered per MD order. Support provided. Patient educated on safety on the unit and medications. Routine safety checks every 15 minutes. Patient stated understanding to tell nurse about any new physical symptoms. Patient understands to tell staff of any needs.     R: No adverse drug reactions noted. Patient verbally contracts for safety. Patient remains safe at this time and will continue to monitor.     Problem: Safety: Goal: Ability to remain free from injury will improve Outcome: Progressing   Problem: Education: Goal: Knowledge of [facility] Education information/materials will improve Outcome: Progressing Goal: Emotional status will improve Outcome: Progressing   Problem: Activity: Goal: Interest or engagement in activities will improve Outcome: Progressing  Patient oriented to the unit. Patient reports feeling better than on admission. Patient will socialize in the dayroom and hallways. Patient remains safe and will continue to monitor.

## 2018-04-21 NOTE — Progress Notes (Signed)
Review of orders indicates UA has been ordered. Verified with patient that she submitted sample which was sent to lab this evening.

## 2018-04-21 NOTE — [facility] Group Notes (Signed)
Pt was invited but did not attend orientation group facilitated by MHT Michael A.

## 2018-04-21 NOTE — [facility] Group Notes (Signed)
[facility] Group Notes:  (Nursing/MHT/Case Management/Adjunct)  Date:  04/21/2018  Time:  1:34 PM  Type of Therapy:  Psychoeducational Skills  Participation Level:  Active  Participation Quality:  Appropriate  Affect:  Appropriate  Cognitive:  Appropriate  Insight:  Appropriate  Engagement in Group:  Engaged  Modes of Intervention:  Problem-solving  Summary of Progress/Problems: Pt attended Psychoeducational group with top topic healthy support systems.   Sarah Cervantes 04/21/2018, 1:34 PM

## 2018-04-21 NOTE — Progress Notes (Signed)
Patient ID: Sarah Cervantes, female   DOB: [birth year], 37 y.o.   MRN: [id number]   D: Pt has been flat and depressed on the unit today, pt attended all groups and engaged in treatment. Pt reported that her depression was a 0, her hopelessness was a 0, and her anxiety was a 0. Pt reported that her goal for today was to work on attitude. Pt reported being negative SI/HI, no AH/VH noted. Pt reported that she had a yeast infection, she was seen by doctor, new orders noted. A: 15 min checks continued for patient safety. R: Pt safety maintained.

## 2018-04-21 NOTE — Plan of Care (Signed)
  Problem: Education: Goal: Knowledge of disease or condition will improve Outcome: Progressing Goal: Understanding of discharge needs will improve Outcome: Progressing

## 2018-04-21 NOTE — [facility] Group Notes (Signed)
LCSW Group Therapy Note  04/21/2018   10:00-11:00am   Type of Therapy and Topic:  Group Therapy: Anger Cues and Responses  Participation Level:  Did Not Attend   Description of Group:   In this group, patients learned how to recognize the physical, cognitive, emotional, and behavioral responses they have to anger-provoking situations.  They identified a recent time they became angry and how they reacted.  They analyzed how their reaction was possibly beneficial and how it was possibly unhelpful.  The group discussed a variety of healthier coping skills that could help with such a situation in the future.  Deep breathing was practiced briefly.  Therapeutic Goals: 1. Patients will remember their last incident of anger and how they felt emotionally and physically, what their thoughts were at the time, and how they behaved. 2. Patients will identify how their behavior at that time worked for them, as well as how it worked against them. 3. Patients will explore possible new behaviors to use in future anger situations. 4. Patients will learn that anger itself is normal and cannot be eliminated, and that healthier reactions can assist with resolving conflict rather than worsening situations.  Summary of Patient Progress:  N/A.  Therapeutic Modalities:   Cognitive Behavioral Therapy  Maretta Los

## 2018-04-21 NOTE — Progress Notes (Signed)
Patient did not attend AA meeting. Patient notified of meeting and patient remained in bed.

## 2018-04-22 LAB — URINALYSIS, ROUTINE W REFLEX MICROSCOPIC
Bacteria, UA: NONE SEEN
Bilirubin Urine: NEGATIVE
GLUCOSE, UA: NEGATIVE mg/dL
KETONES UR: NEGATIVE mg/dL
NITRITE: NEGATIVE
PH: 8 (ref 5.0–8.0)
PROTEIN: NEGATIVE mg/dL
Specific Gravity, Urine: 1.009 (ref 1.005–1.030)

## 2018-04-22 MED ORDER — ARIPIPRAZOLE 5 MG PO TABS: 5.0000 mg | ORAL_TABLET | Freq: Every day | ORAL | 0 refills | Status: DC

## 2018-04-22 MED ORDER — TOPIRAMATE 25 MG PO TABS
50.0000 mg | ORAL_TABLET | Freq: Every day | ORAL | Status: DC
  Filled 2018-04-22: qty 2

## 2018-04-22 MED ORDER — ENALAPRIL MALEATE 5 MG PO TABS: 5.0000 mg | ORAL_TABLET | Freq: Every day | ORAL | 0 refills | Status: DC

## 2018-04-22 MED ORDER — HYDROXYZINE HCL 50 MG PO TABS: 50.0000 mg | ORAL_TABLET | Freq: Three times a day (TID) | ORAL | 0 refills | Status: DC | PRN

## 2018-04-22 MED ORDER — GABAPENTIN 400 MG PO CAPS: 400.0000 mg | ORAL_CAPSULE | Freq: Three times a day (TID) | ORAL | 0 refills | Status: DC

## 2018-04-22 MED ORDER — NICOTINE POLACRILEX 2 MG MT GUM: 2.0000 mg | CHEWING_GUM | OROMUCOSAL | 0 refills | Status: DC | PRN

## 2018-04-22 MED ORDER — TRAZODONE HCL 100 MG PO TABS: 100.0000 mg | ORAL_TABLET | Freq: Every evening | ORAL | 0 refills | Status: DC | PRN

## 2018-04-22 MED ORDER — DULOXETINE HCL 30 MG PO CPEP: 30.0000 mg | ORAL_CAPSULE | Freq: Every day | ORAL | 0 refills | Status: DC
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
## 2018-04-22 NOTE — Progress Notes (Signed)
D: Patient observed resting in bed at the start of shift. Did not attend group. Patient somatic this evening complaining of cough, congestion, SOB, and nausea. Patient states, "I'm leaving tomorrow for [facility]. I want to be rested so I hope I sleep well. I thought this bronchitis was gone but now it seems like it's back." Patient's affect animated, mood anxious but pleasant. Denies pain.   A: Medicated per orders, prn IH, cepacol, and zofran given for complaints. Medication education provided. Level III obs in place for safety. Emotional support offered. Patient encouraged to complete Suicide Safety Plan before discharge. Encouraged to attend and participate in unit programming.   R: Patient verbalizes understanding of POC. On reassess, patient reports decreased symptoms. Patient denies SI/HI/AVH and remains safe on level III obs. Will continue to monitor throughout the night.

## 2018-04-22 NOTE — Progress Notes (Signed)
Patient ID: Sarah Cervantes, female   DOB: [birth year], 37 y.o.   MRN: [id number]  Nursing Progress Note [id number]  Data: On initial approach, patient was up speaking with the doctor this morning. Patient presents calm, pleasant and cooperative. Patient compliant with scheduled medications and requests Ibuprofen for a headache. Patient completed self-inventory sheet and rates depression, hopelessness, and anxiety 0,0,0 respectively. Patient rates their sleep and appetite as poor/fair respectively. Patient states goal for today is to "keep an open mind" and "start my new life". Patient is seen attending groups and is seen interacting with peers in the dayroom. Patient currently denies SI/HI/AVH.   Action: Patient is educated about and provided medication per provider's orders. Patient safety maintained with q15 min safety checks and frequent rounding. Low fall risk precautions in place. Emotional support given. 1:1 interaction and active listening provided. Patient encouraged to attend meals, groups, and work on treatment plan and goals. Labs, vital signs and patient behavior monitored throughout shift.   Response: Patient remains safe on the unit at this time and agrees to come to staff with any issues/concerns. Patient is interacting with peers appropriately on the unit. Will continue to support and monitor.

## 2018-04-22 NOTE — Progress Notes (Signed)
Recreation Therapy Notes  Date: 11.25.19 Time: 0930 Location: 300 Hall Dayroom  Group Topic: Stress Management  Goal Area(s) Addresses:  Patient will verbalize importance of using healthy stress management.  Patient will identify positive emotions associated with healthy stress management.   Behavioral Response: Engaged  Intervention: Stress Management  Activity :  Guided Imagery.  LRT introduced the stress management technique of guided imagery to patients.  LRT read a script that allowed patients to envision being outside looking at a starry sky at night.  Patients were to listen and follow along as script was read to engage in activity.  Education:  Stress Management, Discharge Planning.   Education Outcome: Acknowledges edcuation/In group clarification offered/Needs additional education  Clinical Observations/Feedback: Pt attended and participated in activity.     Victorino Sparrow, LRT/CTRS         Ria Comment, Ester Mabe A 04/22/2018 11:11 AM

## 2018-04-22 NOTE — Progress Notes (Signed)
Did not attend group 

## 2018-04-22 NOTE — BHH Suicide Risk Assessment (Signed)
[facility] Discharge Suicide Risk Assessment   Principal Problem: Polysubstance dependence including opioid drug with daily use ([place]) Discharge Diagnoses: Principal Problem:   Polysubstance dependence including opioid drug with daily use ([place]) Active Problems:   Bipolar I disorder, current or most recent episode depressed, with psychotic features ([place])   Constipation   Bacterial vaginosis   Total Time spent with patient: 15 minutes  Musculoskeletal: Strength & Muscle Tone: within normal limits Gait & Station: normal Patient leans: N/A  Psychiatric Specialty Exam: Review of Systems  All other systems reviewed and are negative.   Blood pressure (!) 152/104, pulse 92, temperature 97.6 F (36.4 C), temperature source Oral, resp. rate 18, height 5\' 4"  (1.626 m), weight (!) 143.7 kg, last menstrual period 03/23/2018, SpO2 97 %.Body mass index is 54.38 kg/m.  General Appearance: Casual  Eye Contact::  Fair  Speech:  Normal Rate409  Volume:  Normal  Mood:  Euthymic  Affect:  Congruent  Thought Process:  Coherent and Descriptions of Associations: Intact  Orientation:  Full (Time, Place, and Person)  Thought Content:  Logical  Suicidal Thoughts:  No  Homicidal Thoughts:  No  Memory:  Immediate;   Fair Recent;   Fair Remote;   Fair  Judgement:  Intact  Insight:  Fair  Psychomotor Activity:  Normal  Concentration:  Fair  Recall:  [employer] of Knowledge:Fair  Language: Fair  Akathisia:  Negative  Handed:  Right  AIMS (if indicated):     Assets:  Communication Skills Desire for Improvement Physical Health Resilience  Sleep:  Number of Hours: 6  Cognition: WNL  ADL's:  Intact   Mental Status Per Nursing Assessment::   On Admission:  Self-harm thoughts  Demographic Factors:  Caucasian, Low socioeconomic status and Unemployed  Loss Factors: NA  Historical Factors: Impulsivity  Risk Reduction Factors:   Positive coping skills or problem solving skills  Continued  Clinical Symptoms:  Bipolar Disorder:   Depressive phase Alcohol/Substance Abuse/Dependencies  Cognitive Features That Contribute To Risk:  None    Suicide Risk:  Minimal: No identifiable suicidal ideation.  Patients presenting with no risk factors but with morbid ruminations; may be classified as minimal risk based on the severity of the depressive symptoms  Follow-up Information    [name] Follow up.   Specialty:  Behavioral Health Why:  Walk in within 3 days of hospital/rehab discharge to be assessed for outpatient mental health services including medication management and therapy. Walk in hours: Monday-Friday 8am-10am. Thank you.  Contact information: [place] [address] [phone]        [facility]. Go on 04/22/2018.   Why:  Please attend on Monday, 04/22/18 at 1:00p. Please bring: Photo ID, proof of insurance, and 5 days of clothing.  Contact information: [place] [address] [phone]           Plan Of Care/Follow-up recommendations:  Activity:  ad lib  Sharma Covert, MD 04/22/2018, 7:51 AM

## 2018-04-22 NOTE — Plan of Care (Signed)
  Problem: Activity: Goal: Interest or engagement in activities will improve Outcome: Progressing   Problem: Health Behavior/Discharge Planning: Goal: Compliance with treatment plan for underlying cause of condition will improve Outcome: Progressing   Problem: Safety: Goal: Periods of time without injury will increase Outcome: Progressing   

## 2018-04-22 NOTE — Plan of Care (Signed)
04/22/2018  Addendum to medical record  Patient is seen and examined.  Patient is a 37 year old female with a past psychiatric history significant for polysubstance abuse and bipolar disorder as well as posttraumatic stress disorder.  Arrangements were made for the patient to be transported by police to a residential substance abuse program today.  Unfortunately while the paperwork for her involuntary commitment was submitted appropriately, it was not processed over the weekend and the police are unable to transport her without the involuntary commitment in place.  The plan will be for the police to transport her tomorrow morning to the residential substance abuse facility.  Without this formal completion her ability to go to the residential facility would be essentially 0, and would lose her opportunity for the residential program thus leading to increased morbidity from her underlying psychiatric illness.  Angela Cox MD

## 2018-04-23 NOTE — Progress Notes (Signed)
Patient ID: Sarah Cervantes, female   DOB: 10-20-1980, 37 y.o.   MRN: [id number]  Nursing Progress Note [id number]  Data: Patient presents calm, pleasant and cooperative. Patient does endorse mild anxiety this morning but reports she is ready to go to [facility]. Patient compliant with scheduled medications. Patient denies pain/physical complaints. Patient is seen attending groups and visible in the milieu. Patient currently denies SI/HI/AVH.   Action: Patient is educated about and provided medication per provider's orders. Patient safety maintained with q15 min safety checks and frequent rounding. Low fall risk precautions in place. Emotional support given. 1:1 interaction and active listening provided. Patient encouraged to attend meals, groups, and work on treatment plan and goals. Labs, vital signs and patient behavior monitored throughout shift.   Response: Patient remains safe on the unit at this time and agrees to come to staff with any issues/concerns. Patient is interacting with peers appropriately on the unit. Will continue to support and monitor.

## 2018-04-23 NOTE — Progress Notes (Signed)
Patient expected to discharge today to [facility] via [facility]. CSW spoke with intake at [facility] to confirm, facility aware of patient and prepared to accept patient.  Stephanie Acre, LCSW-A Clinical Social Worker

## 2018-04-23 NOTE — Progress Notes (Addendum)
Patient ID: Sarah Cervantes, female   DOB: Aug 19, 1980, 36 y.o.   MRN: [id number]  Discharge Note  D) Patient discharged to lobby. Sheriff in lobby to transport patient to [facility]. Patient states readiness for discharge. Patient denies SI/HI, AVH and is not delusional or psychotic.   A) Written and verbal discharge instructions given to the patient. Patient accepting to information and verbalized understanding. Patient agrees to the discharge plan. Opportunity for questions and concerns presented to patient. Patient denied any further questions or concerns. All belongings returned to patient. Patient signed for return of belongings and discharge paperwork. Patient has completed their Suicide Safety Plan and has been provided Suicide Prevention Education. Patient provided an opportunity to complete and return Patient Satisfaction Survey.   R) Patient safely escorted to the lobby. Patient discharged from [facility] with prescriptions, personal belongings, follow-up appointment in place and discharge paperwork.
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
## 2018-04-23 NOTE — Progress Notes (Signed)
  Sarah Cervantes Adult Case Management Discharge Plan :  Will you be returning to the same living situation after discharge:  No. Sarah Cervantes At discharge, do you have transportation home?: Yes,  [employer] will be transporting her to [facility] this afternoon.  Do you have the ability to pay for your medications: Yes,  mental health  Release of information consent forms completed and submitted to medical records by CSW.  Patient to Follow up at: Follow-up Information    [name] Follow up.   Specialty:  Behavioral Health Why:  Walk in within 3 days of hospital/rehab discharge to be assessed for outpatient mental health services including medication management and therapy. Walk in hours: Monday-Friday 8am-10am. Thank you.  Contact information: Sarah Cervantes [address] [phone]        [facility]. Go on 04/22/2018.   Why:  Please attend on Tuesday, 04/23/18 at 2pm. [employer] will provide transportation. Admitting MD: Dr. Ileene Patrick. Thank you.  Contact information: Sarah Cervantes [address] [phone]           Next level of care provider has access to [place] and Suicide Prevention discussed: Yes,  SPE completed with both pt and her aunt. SPI pamphlet and mobile crisis information provided to pt.   Have you used any form of tobacco in the last 30 days? (Cigarettes, Smokeless Tobacco, Cigars, and/or Pipes): Yes  Has patient been referred to the Quitline?: Patient refused referral  Patient has been referred for addiction treatment: Yes  Sarah Laine, LCSW 04/23/2018, 9:27 AM

## 2018-04-23 NOTE — Progress Notes (Signed)
D: Patient observed resting in bed at start of shift however was visible on unit at the cessation of AA. Patient is pleasant with some anxiety noted. Hopeful that she can be transported to [facility] later today. Patient states, "I'm doing well. I'm hoping I don't lose my bed." Patient's affect anxious, mood congruent. Denies pain, physical complaints.   A: Medicated per orders, prn vistaril given per her request after awakening after midnight. Medication education provided. Level III obs in place for safety. Emotional support offered. Patient encouraged to complete Suicide Safety Plan before discharge. Encouraged to attend and participate in unit programming.   R: Patient verbalizes understanding of POC. On reassess, patient is asleep. Patient denies SI/HI/AVH and remains safe on level III obs. Will continue to monitor throughout the night.

## 2019-07-27 IMAGING — DX DG KNEE 3 VIEWS*L*
3 series · 3 of 3 positions shown · non-contrast
Comparison: None.

CLINICAL DATA: Twisted knee today and then fell, now with lateral
knee pain

EXAM:
LEFT KNEE - 3 VIEW

[knee ap (1 of 2)]
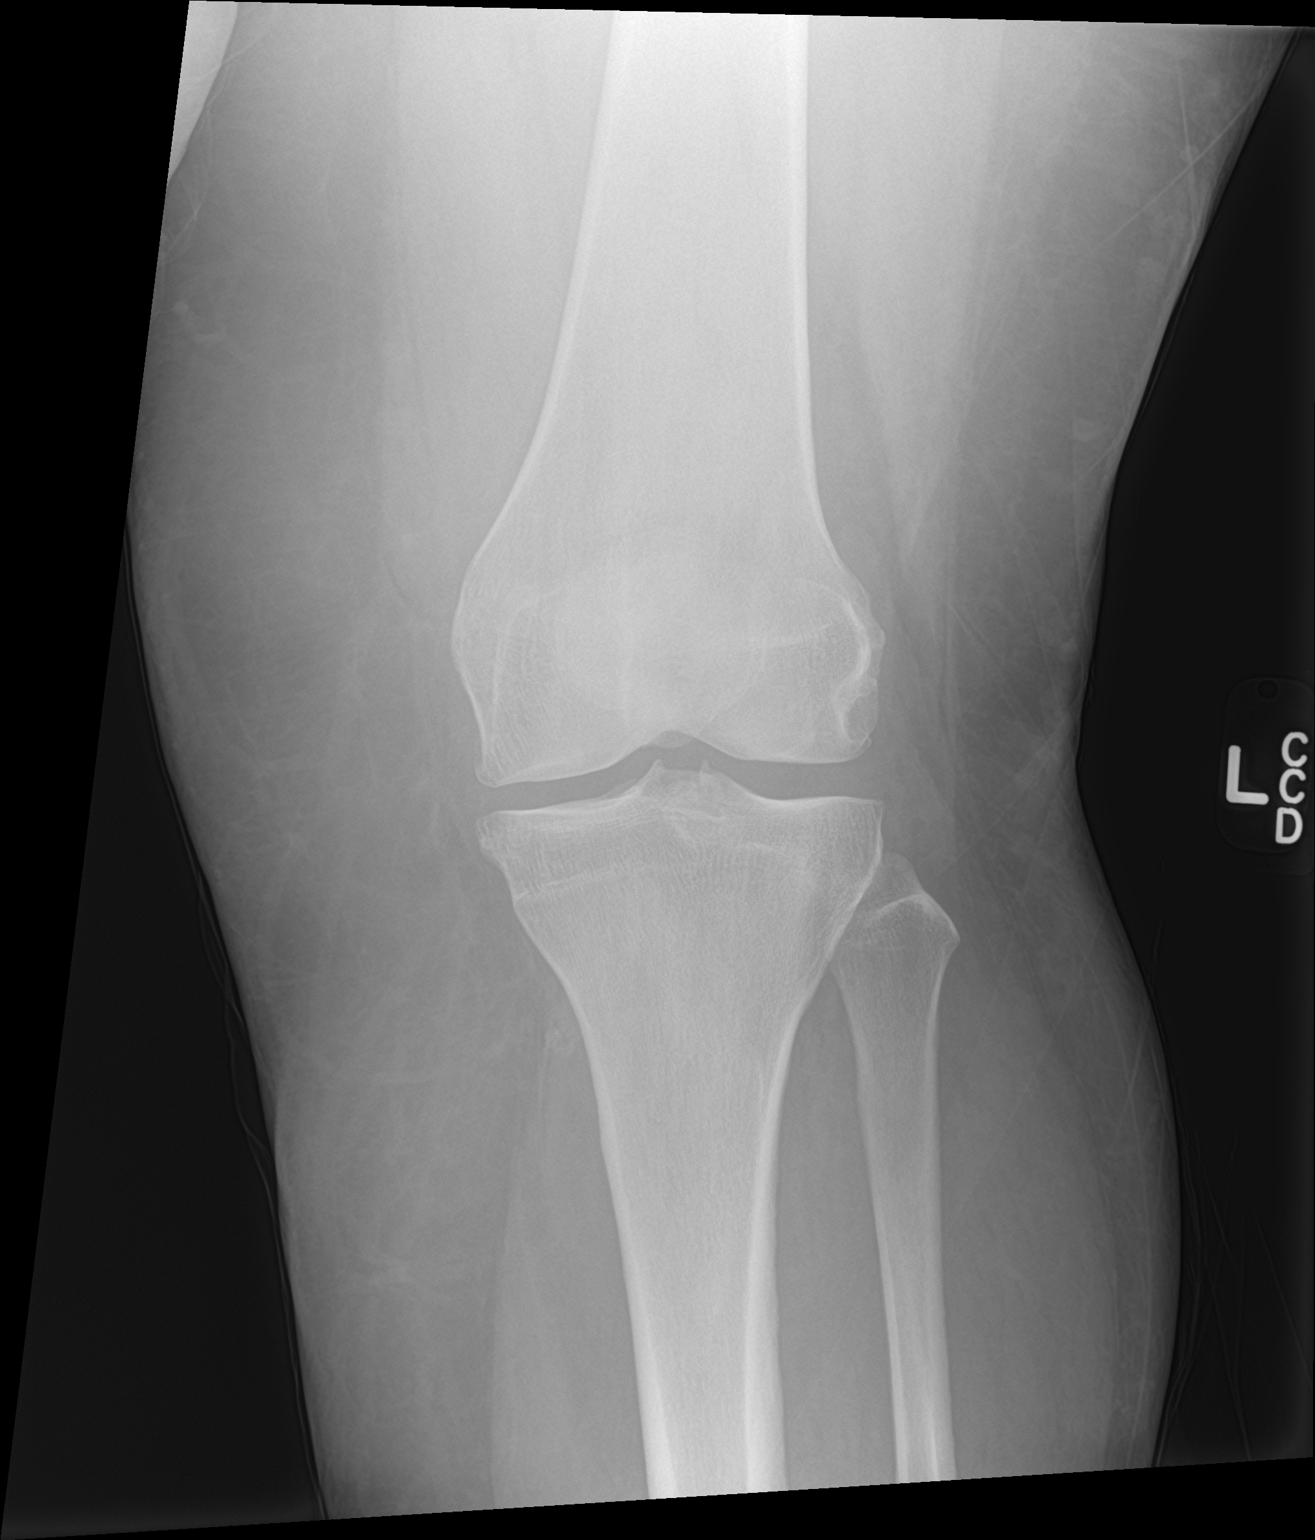

[knee lat]
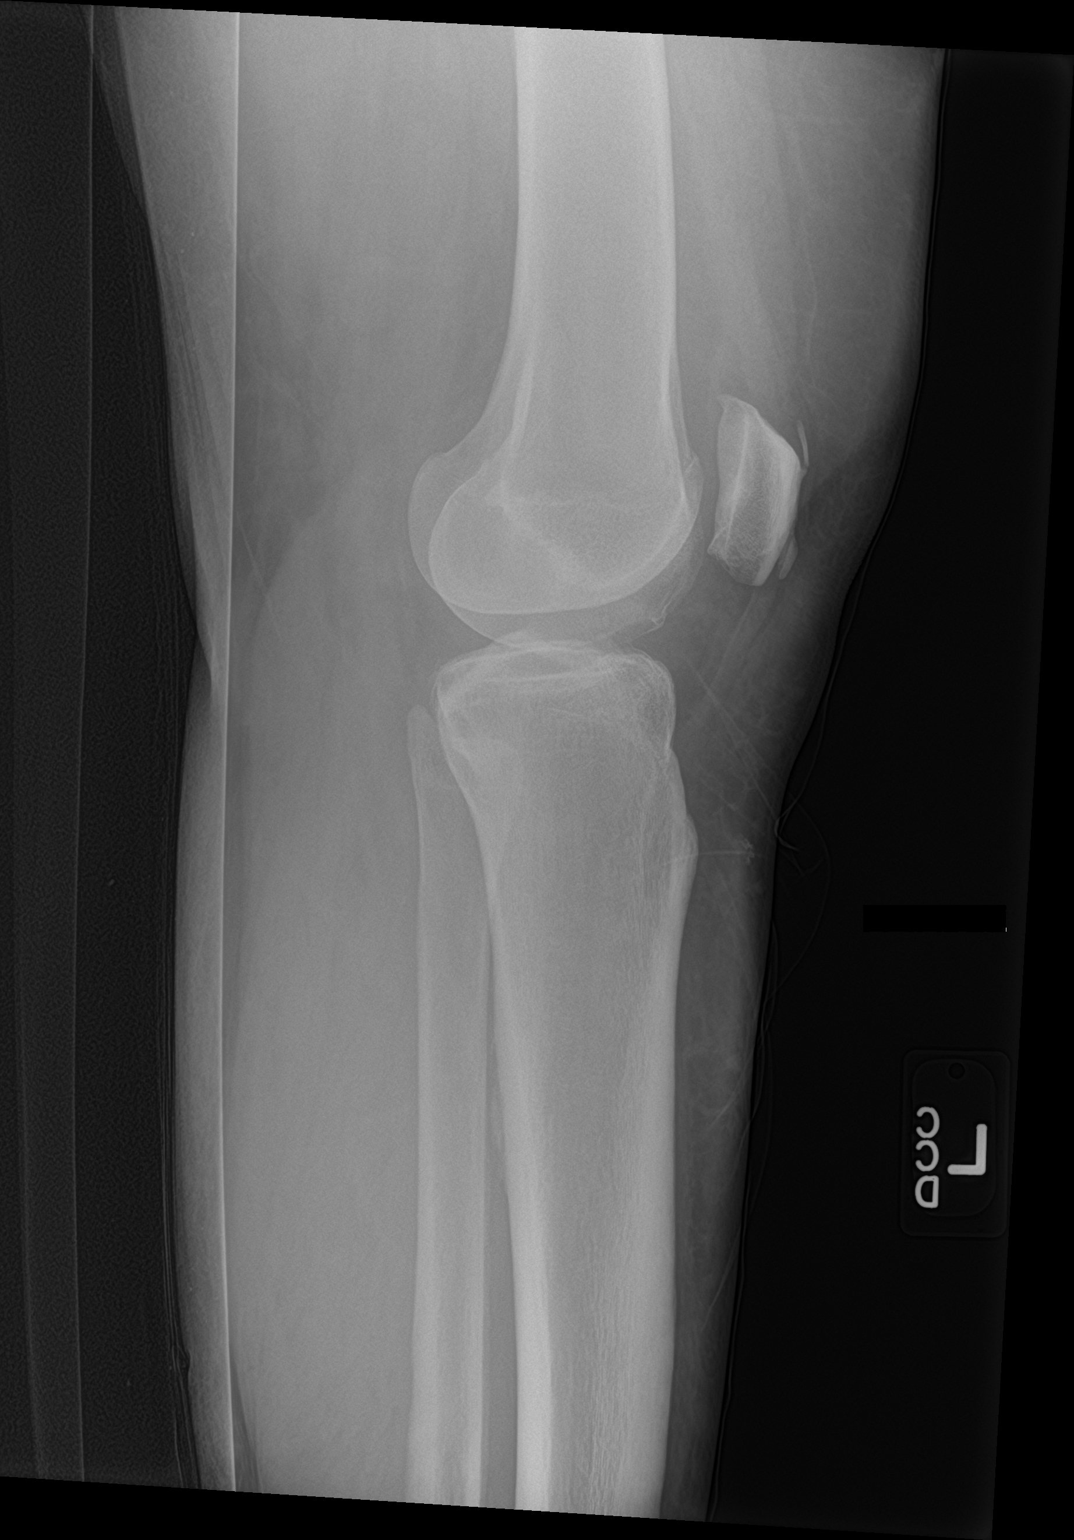

[knee ap (2 of 2)]
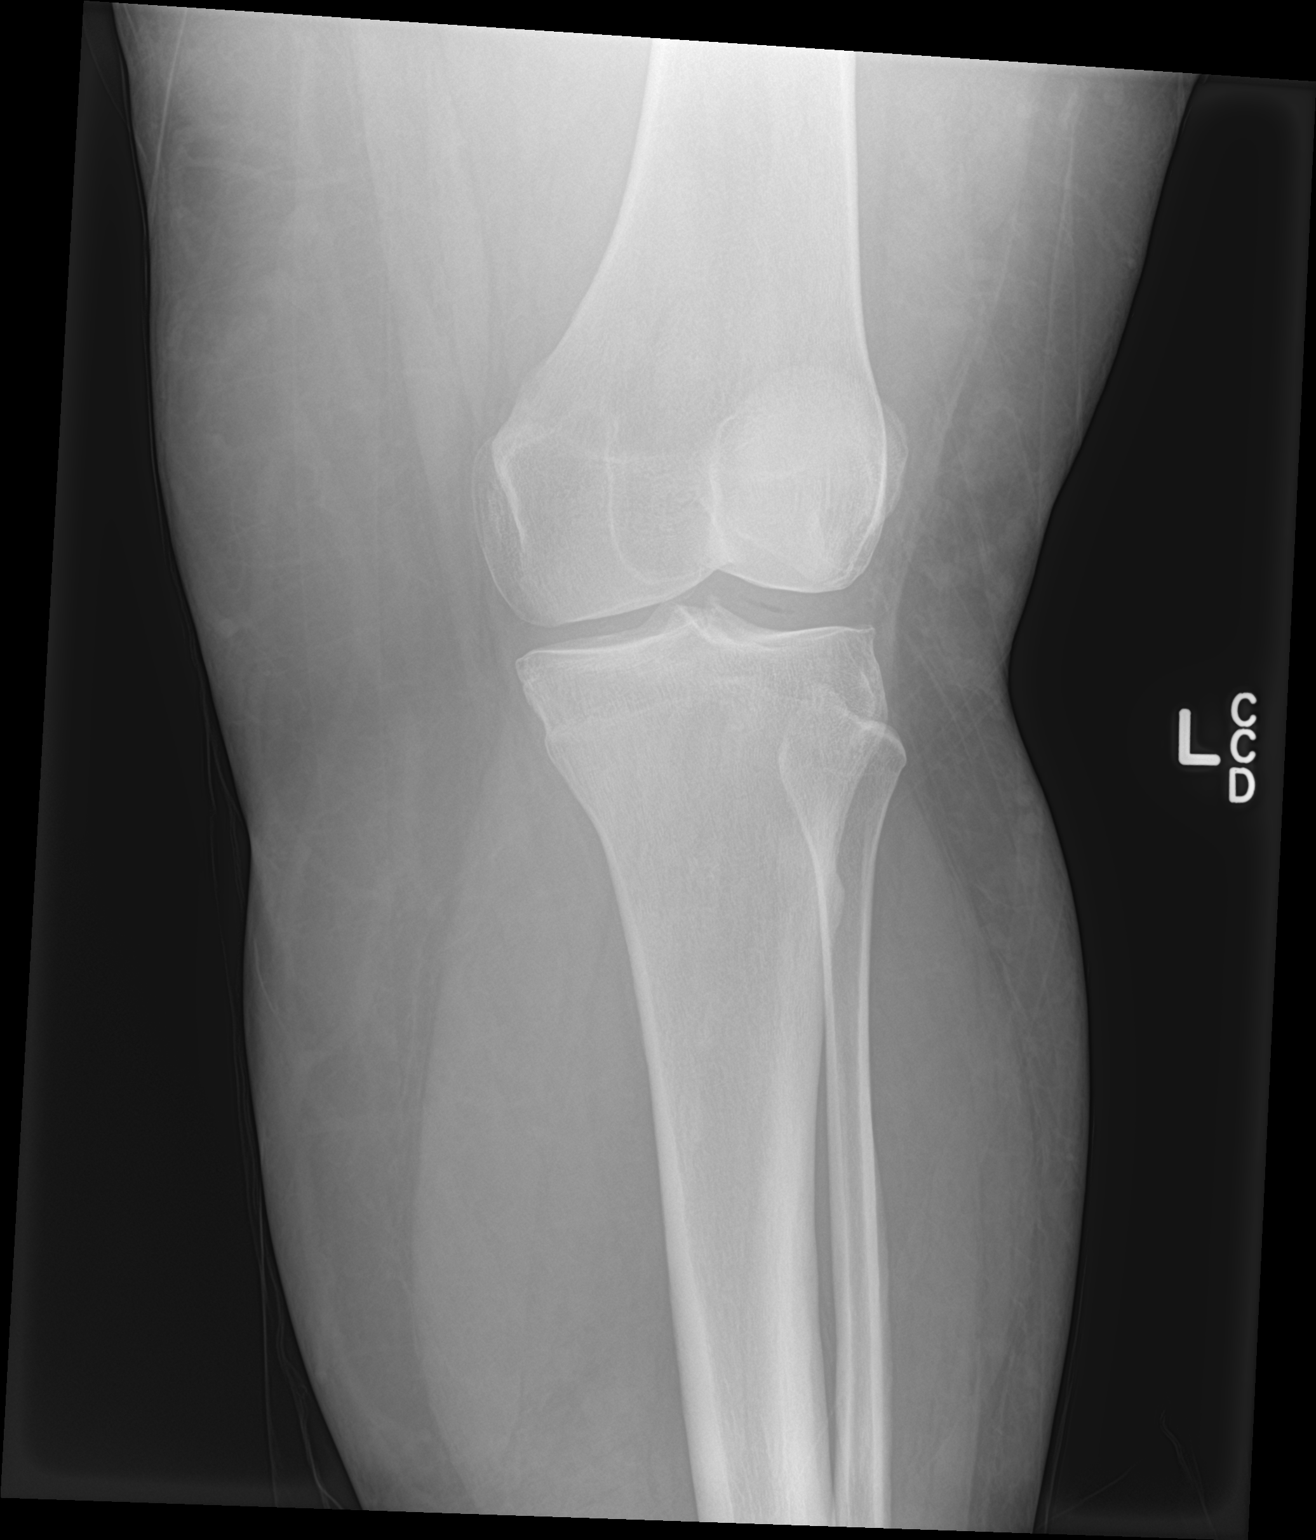

[3 of 3 positions shown; findings below may reference images not displayed]

FINDINGS: No acute fracture is seen. However, for age there is mild
tricompartmental degenerative joint disease with some spurring from
all 3 compartments. However the joint spaces appear well preserved.
No acute abnormality is seen. No joint effusion is seen.
IMPRESSION: No acute abnormality.  Mild tricompartmental degenerative spurring.

## 2019-07-30 IMAGING — CT CT ABD-PELV W/ CM
2 of 4 series · 16 of 46 positions shown, 18 images · IV contrast (ISOVUE)
Comparison: None.

CLINICAL DATA: Lower abdominal pain

EXAM:
CT ABDOMEN AND PELVIS WITH CONTRAST
TECHNIQUE: Multidetector CT imaging of the abdomen and pelvis was performed
using the standard protocol following bolus administration of
intravenous contrast.
CONTRAST:  100mL O9AQPD-MJJ IOPAMIDOL (O9AQPD-MJJ) INJECTION 61%

[Series 2: axial st · axial · 0.86mm/px · z∈[-522,-102]mm · 13 of 96 slices shown, 15 images]
[im 6/96  soft-tissue]
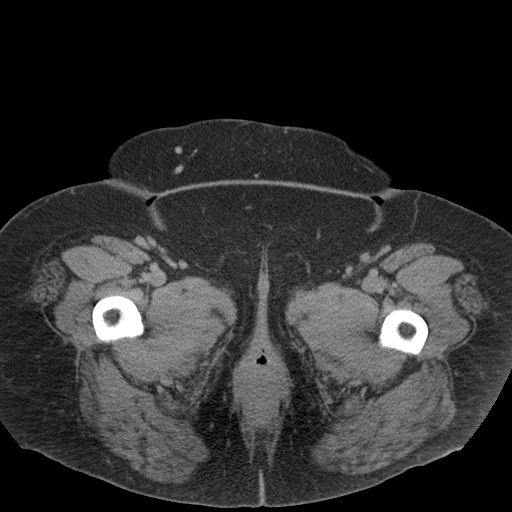
[im 6/96  bone]
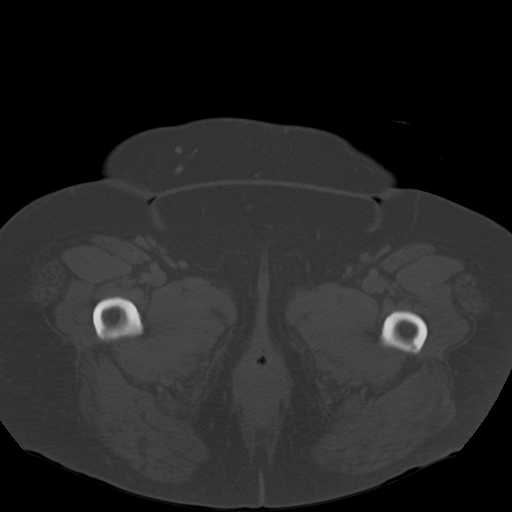
[im 11/96  soft-tissue]
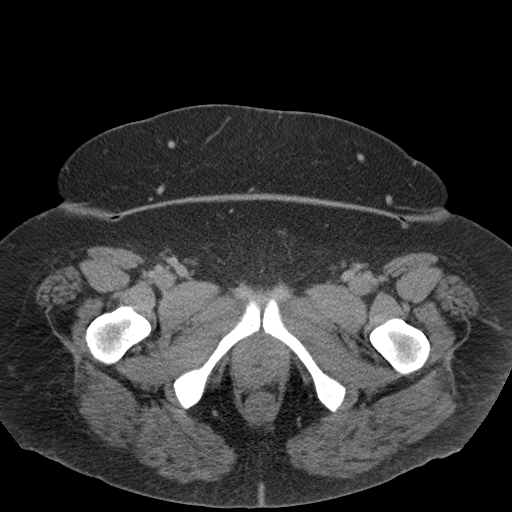
[im 22/96  soft-tissue]
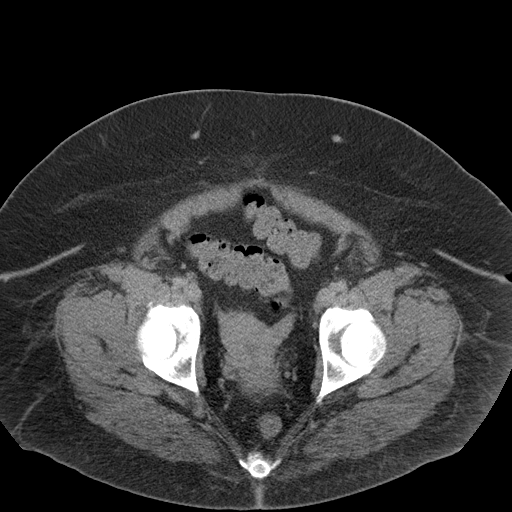
[im 27/96  soft-tissue]
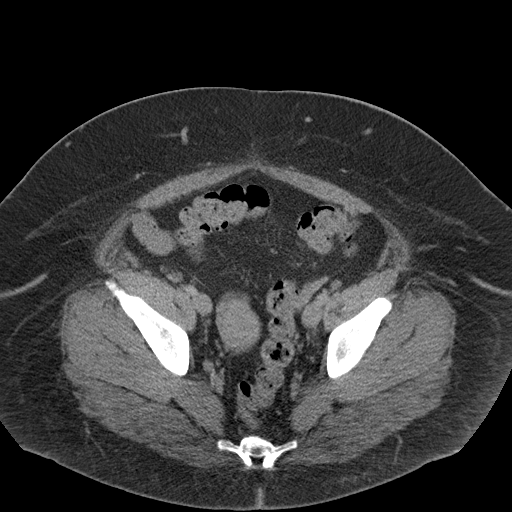
[im 32/96  soft-tissue]
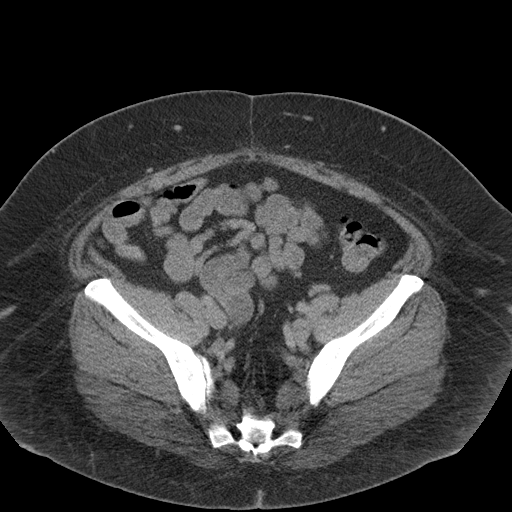
[im 43/96  soft-tissue]
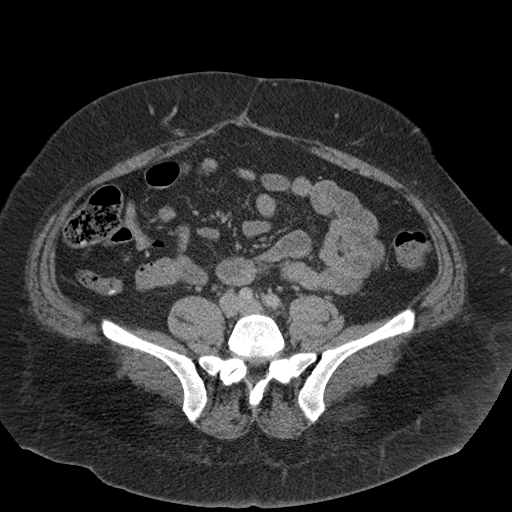
[im 48/96  soft-tissue]
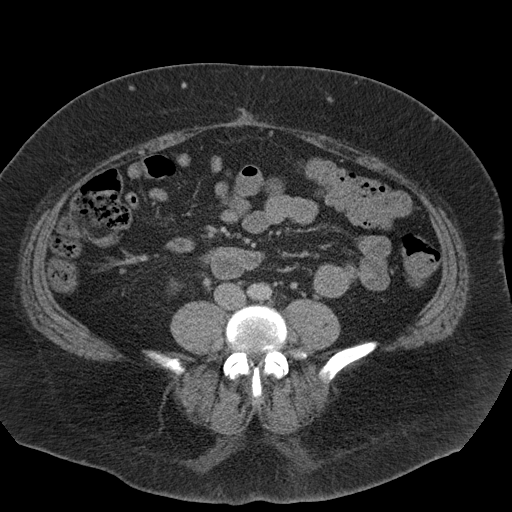
[im 53/96  soft-tissue]
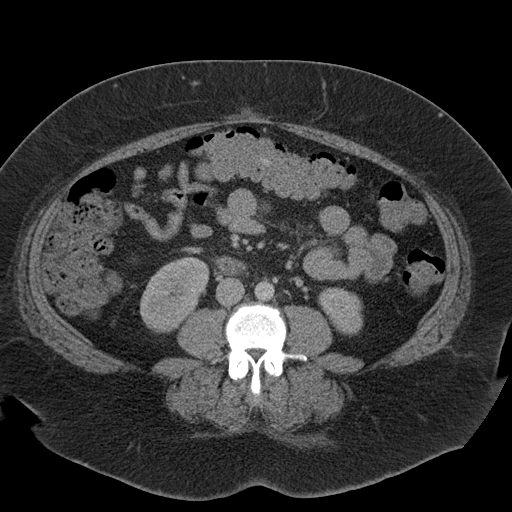
[im 64/96  soft-tissue]
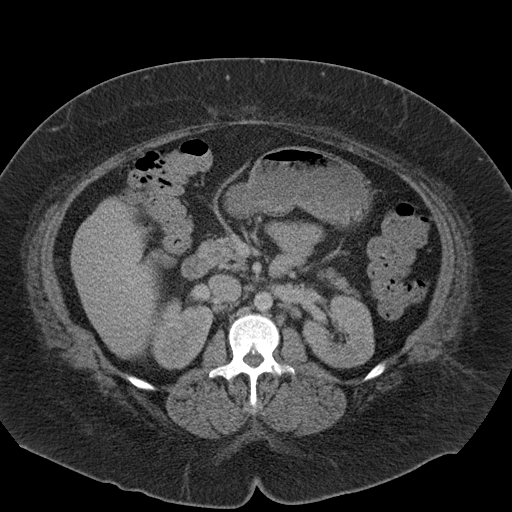
[im 64/96  bone]
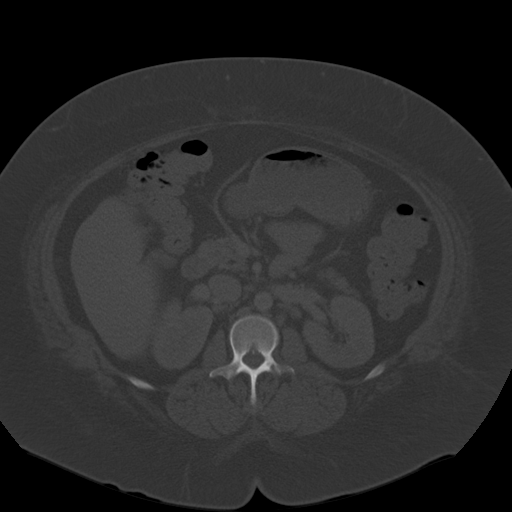
[im 69/96  soft-tissue]
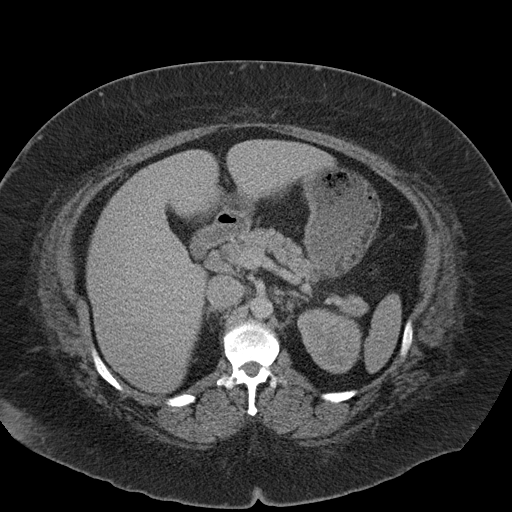
[im 74/96  soft-tissue]
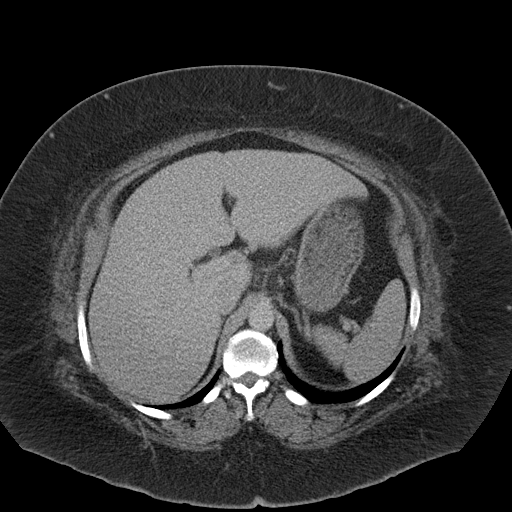
[im 85/96  soft-tissue]
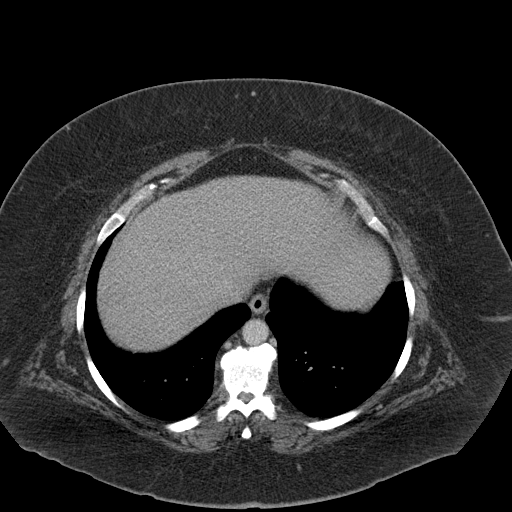
[im 90/96  soft-tissue]
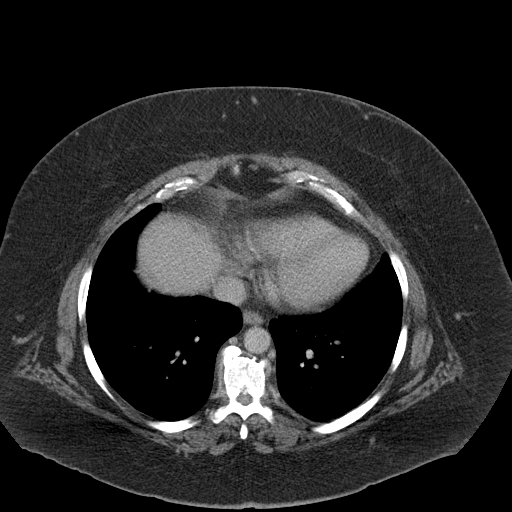

[Series 5: coronal st · coronal · 0.87mm/px · 3 of 109 slices shown]
[im 37/109  soft-tissue]
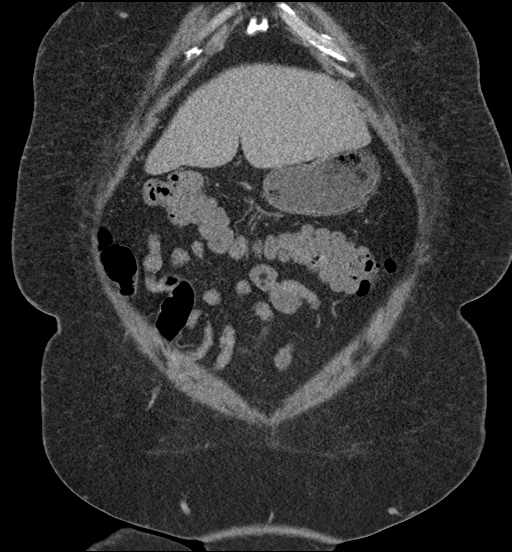
[im 49/109  soft-tissue]
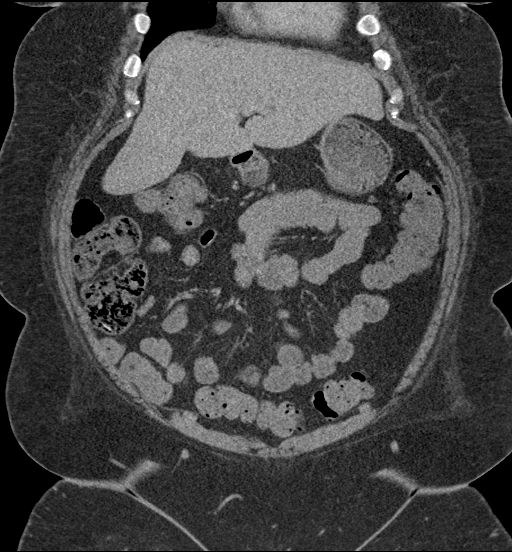
[im 61/109  soft-tissue]
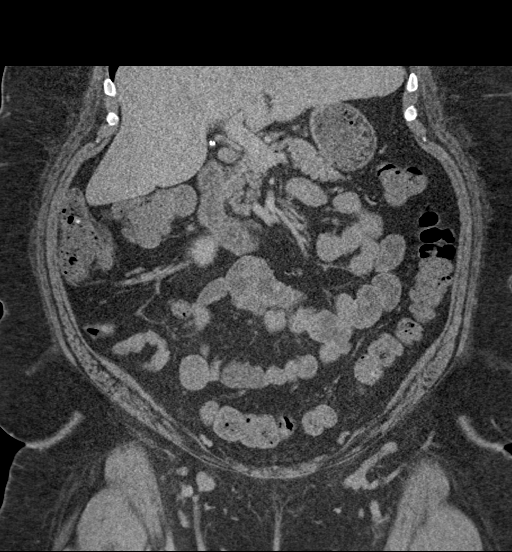

[16 of 46 positions shown; findings below may reference images not displayed]

FINDINGS: Lower chest: No acute abnormality.

Hepatobiliary: No focal liver abnormality is seen. Status post
cholecystectomy. No biliary dilatation.

Pancreas: Unremarkable. No pancreatic ductal dilatation or
surrounding inflammatory changes.

Spleen: Normal in size without focal abnormality.

Adrenals/Urinary Tract: Adrenal glands are within normal limits.
Kidneys are well visualized bilaterally. Small calcification is
noted in the cortex with some surrounding decreased attenuation
within the right kidney. Minimal scarring in this region is noted.
This may represent a small stone within the calyceal diverticulum.
No ureteral calculi are seen. No obstructive changes are noted. The
bladder is decompressed.

Stomach/Bowel: The appendix is not well visualized. No inflammatory
changes to suggest appendicitis are noted. The large and small bowel
show no obstructive or inflammatory changes. The stomach is within
normal limits.

Vascular/Lymphatic: No significant vascular findings are present. No
enlarged abdominal or pelvic lymph nodes.

Reproductive: Uterus is within normal limits. A right ovarian cyst
measuring 3.3 cm is noted. No complicating factors are seen.

Other: No abdominal wall hernia or abnormality. No abdominopelvic
ascites.

Musculoskeletal: Degenerative changes of the lumbar spine are noted.
IMPRESSION: Tiny cortical calcification in the right kidney which may represent
a stone within a caliceal diverticulum. No obstructive changes are
seen.

Right ovarian cyst without complicating factors.

## 2021-07-15 ENCOUNTER — Emergency Department (HOSPITAL_COMMUNITY)
Admission: EM | Admit: 2021-07-15 | Discharge: 2021-07-16 | Disposition: A | Discharge status: Home / Self Care | Payer: Self-pay | Attending: Emergency Medicine | Admitting: Emergency Medicine

## 2021-07-15 ENCOUNTER — Emergency Department (HOSPITAL_COMMUNITY): Payer: Self-pay

## 2021-07-15 ENCOUNTER — Encounter (HOSPITAL_COMMUNITY): Payer: Self-pay

## 2021-07-15 DIAGNOSIS — S0990XA Unspecified injury of head, initial encounter: Secondary | ICD-10-CM | POA: Insufficient documentation

## 2021-07-15 DIAGNOSIS — F315 Bipolar disorder, current episode depressed, severe, with psychotic features: Secondary | ICD-10-CM | POA: Diagnosis present

## 2021-07-15 DIAGNOSIS — R4589 Other symptoms and signs involving emotional state: Secondary | ICD-10-CM

## 2021-07-15 DIAGNOSIS — F1920 Other psychoactive substance dependence, uncomplicated: Secondary | ICD-10-CM | POA: Diagnosis present

## 2021-07-15 DIAGNOSIS — S300XXA Contusion of lower back and pelvis, initial encounter: Secondary | ICD-10-CM

## 2021-07-15 DIAGNOSIS — R45851 Suicidal ideations: Secondary | ICD-10-CM | POA: Insufficient documentation

## 2021-07-15 DIAGNOSIS — F4310 Post-traumatic stress disorder, unspecified: Secondary | ICD-10-CM | POA: Diagnosis present

## 2021-07-15 DIAGNOSIS — S161XXA Strain of muscle, fascia and tendon at neck level, initial encounter: Secondary | ICD-10-CM

## 2021-07-15 DIAGNOSIS — Z91040 Latex allergy status: Secondary | ICD-10-CM | POA: Insufficient documentation

## 2021-07-15 DIAGNOSIS — F1994 Other psychoactive substance use, unspecified with psychoactive substance-induced mood disorder: Secondary | ICD-10-CM

## 2021-07-15 DIAGNOSIS — Z20822 Contact with and (suspected) exposure to covid-19: Secondary | ICD-10-CM | POA: Insufficient documentation

## 2021-07-15 DIAGNOSIS — W19XXXA Unspecified fall, initial encounter: Secondary | ICD-10-CM

## 2021-07-15 DIAGNOSIS — W109XXA Fall (on) (from) unspecified stairs and steps, initial encounter: Secondary | ICD-10-CM | POA: Insufficient documentation

## 2021-07-15 LAB — RAPID URINE DRUG SCREEN, HOSP PERFORMED
Amphetamines: NOT DETECTED
Barbiturates: NOT DETECTED
Benzodiazepines: NOT DETECTED
Cocaine: POSITIVE — AB
Opiates: POSITIVE — AB
Tetrahydrocannabinol: POSITIVE — AB

## 2021-07-15 LAB — CBC WITH DIFFERENTIAL/PLATELET
Abs Immature Granulocytes: 0.06 10*3/uL (ref 0.00–0.07)
Basophils Absolute: 0 10*3/uL (ref 0.0–0.1)
Basophils Relative: 0 %
Eosinophils Absolute: 0.2 10*3/uL (ref 0.0–0.5)
Eosinophils Relative: 2 %
HCT: 44.2 % (ref 36.0–46.0)
Hemoglobin: 13.9 g/dL (ref 12.0–15.0)
Immature Granulocytes: 1 %
Lymphocytes Relative: 36 %
Lymphs Abs: 3.2 10*3/uL (ref 0.7–4.0)
MCH: 29.8 pg (ref 26.0–34.0)
MCHC: 31.4 g/dL (ref 30.0–36.0)
MCV: 94.6 fL (ref 80.0–100.0)
Monocytes Absolute: 1 10*3/uL (ref 0.1–1.0)
Monocytes Relative: 11 %
Neutro Abs: 4.5 10*3/uL (ref 1.7–7.7)
Neutrophils Relative %: 50 %
Platelets: 430 10*3/uL — ABNORMAL HIGH (ref 150–400)
RBC: 4.67 MIL/uL (ref 3.87–5.11)
RDW: 16.7 % — ABNORMAL HIGH (ref 11.5–15.5)
WBC: 9 10*3/uL (ref 4.0–10.5)
nRBC: 0 % (ref 0.0–0.2)

## 2021-07-15 LAB — BASIC METABOLIC PANEL
Anion gap: 7 (ref 5–15)
BUN: 8 mg/dL (ref 6–20)
CO2: 26 mmol/L (ref 22–32)
Calcium: 8.4 mg/dL — ABNORMAL LOW (ref 8.9–10.3)
Chloride: 104 mmol/L (ref 98–111)
Creatinine, Ser: 0.92 mg/dL (ref 0.44–1.00)
GFR, Estimated: >60 mL/min (ref >60)
Glucose, Bld: 97 mg/dL (ref 70–99)
Potassium: 4.3 mmol/L (ref 3.5–5.1)
Sodium: 137 mmol/L (ref 135–145)

## 2021-07-15 LAB — I-STAT BETA HCG BLOOD, ED (MC, WL, AP ONLY): I-stat hCG, quantitative: <5 m[IU]/mL (ref <5)

## 2021-07-15 LAB — ETHANOL: Alcohol, Ethyl (B): <10 mg/dL (ref <10)

## 2021-07-15 LAB — RESP PANEL BY RT-PCR (FLU A&B, COVID) ARPGX2
Influenza A by PCR: NEGATIVE
Influenza B by PCR: NEGATIVE
SARS Coronavirus 2 by RT PCR: NEGATIVE

## 2021-07-15 MED ORDER — NICOTINE 21 MG/24HR TD PT24
21.0000 mg | MEDICATED_PATCH | Freq: Every day | TRANSDERMAL | Status: DC
  Filled 2021-07-15: qty 1

## 2021-07-15 MED ORDER — GABAPENTIN 400 MG PO CAPS
400.0000 mg | ORAL_CAPSULE | Freq: Three times a day (TID) | ORAL | Status: DC
  Administered 2021-07-15 – 2021-07-16 (×4): 400 mg via ORAL
  Filled 2021-07-15 (×4): qty 1

## 2021-07-15 MED ORDER — MORPHINE SULFATE (PF) 4 MG/ML IV SOLN
4.0000 mg | Freq: Once | INTRAVENOUS | Status: AC
  Administered 2021-07-15: 4 mg via INTRAVENOUS
  Filled 2021-07-15: qty 1

## 2021-07-15 MED ORDER — ONDANSETRON HCL 4 MG/2ML IJ SOLN
4.0000 mg | Freq: Once | INTRAMUSCULAR | Status: AC
  Administered 2021-07-15: 4 mg via INTRAVENOUS
  Filled 2021-07-15: qty 2

## 2021-07-15 MED ORDER — PRAZOSIN HCL 1 MG PO CAPS
1.0000 mg | ORAL_CAPSULE | Freq: Every day | ORAL | Status: DC
  Administered 2021-07-15: 1 mg via ORAL
  Filled 2021-07-15 (×3): qty 1

## 2021-07-15 MED ORDER — HYDROXYZINE HCL 50 MG PO TABS
50.0000 mg | ORAL_TABLET | Freq: Three times a day (TID) | ORAL | Status: DC | PRN
  Administered 2021-07-15: 50 mg via ORAL
  Filled 2021-07-15: qty 2

## 2021-07-15 MED ORDER — DULOXETINE HCL 30 MG PO CPEP
30.0000 mg | ORAL_CAPSULE | Freq: Every day | ORAL | Status: DC
  Administered 2021-07-15 – 2021-07-16 (×2): 30 mg via ORAL
  Filled 2021-07-15 (×3): qty 1

## 2021-07-15 MED ORDER — ENALAPRIL MALEATE 5 MG PO TABS
5.0000 mg | ORAL_TABLET | Freq: Every day | ORAL | Status: DC
  Administered 2021-07-15: 5 mg via ORAL
  Filled 2021-07-15 (×2): qty 1

## 2021-07-15 MED ORDER — TRAZODONE HCL 100 MG PO TABS
100.0000 mg | ORAL_TABLET | Freq: Every evening | ORAL | Status: DC | PRN
  Administered 2021-07-15: 100 mg via ORAL
  Filled 2021-07-15: qty 2

## 2021-07-15 MED ORDER — ARIPIPRAZOLE 5 MG PO TABS
5.0000 mg | ORAL_TABLET | Freq: Every day | ORAL | Status: DC
  Administered 2021-07-15 – 2021-07-16 (×2): 5 mg via ORAL
  Filled 2021-07-15 (×3): qty 1

## 2021-07-15 MED ORDER — TOPIRAMATE 25 MG PO TABS
50.0000 mg | ORAL_TABLET | Freq: Every day | ORAL | Status: DC
  Administered 2021-07-15: 50 mg via ORAL
  Filled 2021-07-15: qty 2

## 2021-07-15 NOTE — ED Provider Notes (Signed)
[facility] EMERGENCY DEPARTMENT Provider Note   CSN: [id number] Arrival date & time: 07/15/21  1414     History  Chief Complaint  Patient presents with   Sarah Cervantes    Cordia Miklos is a 41 y.o. female.  The history is provided by the patient.  Fall This is a new problem. The current episode started 1 to 2 hours ago. The problem occurs constantly. The problem has not changed since onset.Associated symptoms comments: Pt was walking out of her home today when she slipped on the steps and fell backwards down 3-4 steps striking the back of her head, neck and back on the steps.  She was unable to get up due to the pain.  Currently she has a headache, neck and lower back pain.  EMS reported on their arrival she was sating 87% and c/o of SOB but denies having sx before the fall such as SOB, chest pain, dizziness or vomiting.  No numbness or tingling in the arms or legs and no chest pain.  Currently denying SOB. The symptoms are aggravated by bending and twisting. Nothing relieves the symptoms. She has tried nothing for the symptoms. The treatment provided no relief.      Home Medications Prior to Admission medications   Medication Sig Start Date End Date Taking? Authorizing Provider  ARIPiprazole (ABILIFY) 5 MG tablet Take 1 tablet (5 mg total) by mouth daily. 04/23/18   Derrill Center, NP  DULoxetine (CYMBALTA) 30 MG capsule Take 1 capsule (30 mg total) by mouth daily. 04/23/18   Derrill Center, NP  enalapril (VASOTEC) 5 MG tablet Take 1 tablet (5 mg total) by mouth daily. 04/23/18   Derrill Center, NP  gabapentin (NEURONTIN) 400 MG capsule Take 1 capsule (400 mg total) by mouth 3 (three) times daily. 04/22/18   Derrill Center, NP  hydrocortisone (ANUSOL-HC) 2.5 % rectal cream Place rectally 3 (three) times daily. 03/26/18   Charlynne Cousins, MD  hydrOXYzine (ATARAX/VISTARIL) 50 MG tablet Take 1 tablet (50 mg total) by mouth 3 (three) times daily as needed for  anxiety. 04/22/18   Derrill Center, NP  nicotine polacrilex (NICORETTE) 2 MG gum Take 1 each (2 mg total) by mouth as needed for smoking cessation. 04/22/18   Derrill Center, NP  prazosin (MINIPRESS) 1 MG capsule Take 1 capsule (1 mg total) by mouth at bedtime. 04/03/18   Pennelope Bracken, MD  topiramate (TOPAMAX) 50 MG tablet Take 1 tablet (50 mg total) by mouth at bedtime. 04/03/18   Pennelope Bracken, MD  traZODone (DESYREL) 100 MG tablet Take 1 tablet (100 mg total) by mouth at bedtime and may repeat dose one time if needed. 04/22/18   Derrill Center, NP      Allergies    Latex    Review of Systems   Review of Systems  Physical Exam Updated Vital Signs BP (!) 134/97    Pulse 65    Temp 98.3 F (36.8 C) (Oral)    Resp 16    Ht 5\' 4"  (1.626 m)    Wt 124.7 kg    LMP 07/01/2021    SpO2 98%    BMI 47.20 kg/m  Physical Exam Vitals and nursing note reviewed.  Constitutional:      General: She is not in acute distress.    Appearance: She is well-developed.     Comments: Morbid obesity  HENT:     Head: Normocephalic and atraumatic.  Eyes:  Conjunctiva/sclera: Conjunctivae normal.     Pupils: Pupils are equal, round, and reactive to light.  Cardiovascular:     Rate and Rhythm: Normal rate and regular rhythm.     Heart sounds: No murmur heard. Pulmonary:     Effort: Pulmonary effort is normal. No respiratory distress.     Breath sounds: Normal breath sounds. No wheezing or rales.  Chest:     Chest wall: No tenderness.  Abdominal:     General: There is no distension.     Palpations: Abdomen is soft.     Tenderness: There is no abdominal tenderness. There is no guarding or rebound.    Musculoskeletal:        General: Tenderness present.     Cervical back: Neck supple. Tenderness and bony tenderness present. Pain with movement present. Decreased range of motion.     Lumbar back: Tenderness and bony tenderness present. Decreased range of motion.       Back:      Right lower leg: No edema.     Left lower leg: No edema.     Comments: Normal ROM of the bilateral hips, knees and ankles.  No shoulder, elbow or wrist abnormalies bilaterally  Skin:    General: Skin is warm and dry.     Capillary Refill: Capillary refill takes less than 2 seconds.     Findings: No erythema or rash.  Neurological:     Mental Status: She is alert and oriented to person, place, and time. Mental status is at baseline.  Psychiatric:        Mood and Affect: Mood normal.        Behavior: Behavior normal.    ED Results / Procedures / Treatments   Labs (all labs ordered are listed, but only abnormal results are displayed) Labs Reviewed  CBC WITH DIFFERENTIAL/PLATELET - Abnormal; Notable for the following components:      Result Value   RDW 16.7 (*)    Platelets 430 (*)    All other components within normal limits  BASIC METABOLIC PANEL - Abnormal; Notable for the following components:   Calcium 8.4 (*)    All other components within normal limits  RESP PANEL BY RT-PCR (FLU A&B, COVID) ARPGX2  RAPID URINE DRUG SCREEN, HOSP PERFORMED  ETHANOL  I-STAT BETA HCG BLOOD, ED ([facility], [facility], [facility] ONLY)    EKG None  Radiology CT Head Wo Contrast  Result Date: 07/15/2021 CLINICAL DATA:  Trauma EXAM: CT HEAD WITHOUT CONTRAST TECHNIQUE: Contiguous axial images were obtained from the base of the skull through the vertex without intravenous contrast. RADIATION DOSE REDUCTION: This exam was performed according to the departmental dose-optimization program which includes automated exposure control, adjustment of the mA and/or kV according to patient size and/or use of iterative reconstruction technique. COMPARISON:  None. FINDINGS: Brain: No acute intracranial findings are seen. Ventricles are not dilated. There is no shift of midline structures. There are no epidural or subdural fluid collections. Vascular: Unremarkable. Skull: Unremarkable. Sinuses/Orbits: There is mucosal thickening in the  right maxillary sinus. Are no air-fluid levels in the visualized paranasal sinuses. Other: None IMPRESSION: No acute intracranial findings are seen in noncontrast CT brain. Chronic right maxillary sinusitis Electronically Signed   By: Elmer Picker M.D.   On: 07/15/2021 15:40   CT Cervical Spine Wo Contrast  Result Date: 07/15/2021 CLINICAL DATA:  Neck trauma, slipped and fell going down 3 stairs, struck back of neck EXAM: CT CERVICAL SPINE WITHOUT CONTRAST TECHNIQUE: Multidetector  CT imaging of the cervical spine was performed without intravenous contrast. Multiplanar CT image reconstructions were also generated. Degradation of image quality secondary to body habitus. RADIATION DOSE REDUCTION: This exam was performed according to the departmental dose-optimization program which includes automated exposure control, adjustment of the mA and/or kV according to patient size and/or use of iterative reconstruction technique. COMPARISON:  None FINDINGS: Alignment: Normal Skull base and vertebrae: Osseous mineralization normal. Skull base intact. Vertebral body and disc space heights maintained. No fracture, subluxation, or bone destruction. Chronic appearing bony defect at the posterior LEFT first rib. Soft tissues and spinal canal: Prevertebral soft tissues normal thickness. Regional cervical soft tissues unremarkable. Disc levels:  No specific abnormalities Upper chest: Lung apices clear Other: N/A IMPRESSION: No acute cervical spine abnormalities. Electronically Signed   By: Lavonia Dana M.D.   On: 07/15/2021 15:37   CT Lumbar Spine Wo Contrast  Result Date: 07/15/2021 CLINICAL DATA:  Back trauma. Patient fell down steps. Low back and right flank pain. EXAM: CT LUMBAR SPINE WITHOUT CONTRAST TECHNIQUE: Multidetector CT imaging of the lumbar spine was performed without intravenous contrast administration. Multiplanar CT image reconstructions were also generated. RADIATION DOSE REDUCTION: This exam was  performed according to the departmental dose-optimization program which includes automated exposure control, adjustment of the mA and/or kV according to patient size and/or use of iterative reconstruction technique. COMPARISON:  Abdominopelvic CT 04/14/2018 FINDINGS: Segmentation: There are 5 lumbar type vertebral bodies. Alignment: Normal. Vertebrae: No evidence of acute fracture, pars defect or focal osseous lesion. Interval posterior decompression at L4. Congenitally short lumbar pedicles. Paraspinal and other soft tissues: No evidence of paraspinal fluid collection. Postsurgical changes in the paraspinal soft tissues posteriorly related to posterior decompression at L4. Previous cholecystectomy. 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 There is mild endplate osteophyte formation and disc bulging in the visualized lower thoracic spine. L1-2: Mild disc bulging with facet and ligamentous hypertrophy. Mild lateral recess and foraminal narrowing bilaterally. L2-3: Mild disc bulging, facet and ligamentous hypertrophy. Mild narrowing of the lateral recesses and foramina bilaterally. L3-4: Mild loss of disc height with annular disc bulging eccentric to the right. Moderate facet and ligamentous hypertrophy. Resulting mild spinal stenosis with mild lateral recess and foraminal narrowing bilaterally. L4-5: Interval partial posterior decompression of the spinal canal. Disc bulging and facet hypertrophy contribute to residual mild-to-moderate spinal stenosis with mild lateral recess and foraminal narrowing bilaterally. L5-S1: Moderate bilateral facet hypertrophy contributes to moderate foraminal narrowing bilaterally. The spinal canal and lateral recesses appear adequately patent. IMPRESSION: 1. No acute posttraumatic findings identified within the lumbar spine. 2. Interval posterior decompression at L4 with residual mild-to-moderate spinal stenosis. 3. 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 No large disc herniation identified. Electronically Signed   By: Richardean Sale M.D.   On: 07/15/2021 15:50   DG Chest Port 1 View  Result Date: 07/15/2021 CLINICAL DATA:  Hypoxia. EXAM: PORTABLE CHEST 1 VIEW COMPARISON:  04/14/2018 FINDINGS: 1426 hours. The lungs are clear without focal pneumonia, edema, pneumothorax or pleural effusion. Cardiopericardial silhouette is at upper limits of normal for size. The visualized bony structures of the thorax show no acute abnormality. IMPRESSION: No active disease. Electronically Signed   By: Misty Stanley M.D.   On: 07/15/2021 14:37    Procedures Procedures    Medications Ordered in ED Medications  ARIPiprazole (ABILIFY) tablet 5 mg (has no administration in time range)  DULoxetine (CYMBALTA) DR capsule 30  mg (has no administration in time range)  enalapril (VASOTEC) tablet 5 mg (has no administration in time range)  gabapentin (NEURONTIN) capsule 400 mg (has no administration in time range)  hydrOXYzine (ATARAX) tablet 50 mg (has no administration in time range)  prazosin (MINIPRESS) capsule 1 mg (has no administration in time range)  topiramate (TOPAMAX) tablet 50 mg (has no administration in time range)  traZODone (DESYREL) tablet 100 mg (has no administration in time range)  nicotine (NICODERM CQ - dosed in mg/24 hours) patch 21 mg (has no administration in time range)  morphine (PF) 4 MG/ML injection 4 mg (4 mg Intravenous Given 07/15/21 1438)  ondansetron (ZOFRAN) injection 4 mg (4 mg Intravenous Given 07/15/21 1437)    ED Course/ Medical Decision Making/ A&P                           Medical Decision Making Amount and/or Complexity of Data Reviewed Labs: ordered. Decision-making details documented in ED Course. Radiology: ordered and independent interpretation performed. Decision-making details documented in ED Course.  Risk Prescription drug management.   Patient  is a 41 year old female with a past medical history of chronic substance abuse and prior lumbar surgery who is presenting today with a fall down the steps with injury to the head neck and lower back.  She was noted to be hypoxic on EMS arrival and complained of some mild shortness of breath but denies it now and is satting 92 to 94% on room air.  Her breath sounds are clear she has no chest tenderness and low suspicion for rib fracture.  Patient has no focal abdominal pain full range of motion of her lower extremities.  Upper extremities and low suspicion for peripheral fracture.  Given patient's mechanism of injury, areas of pain we will get a CT of the head cervical spine and lumbar spine.  Patient given pain control.  She is on continuous monitoring due to the history of having hypoxia.  She takes no anticoagulation  4:11 PM I independently interpreted patient's labs today and she has a normal CBC, BMP, hcg wnl.  I independently reviewed and interpreted patient's chest x-ray today which was negative, head CT, cervical CT and lumbar CT are all negative for acute fracture.  On repeat evaluation of the patient her cervical spine was cleared.  Findings were discussed with the patient and she is glad to know she has no underlying injury.  Patient then became tearful and reported she had not been completely honest.  She reports feeling hopeless, depressed and is concerned that she will hurt herself.  She is requesting mental health evaluation.  She reports in the past she has been seen at behavioral health urgent care.  She does not have a plan to hurt herself and reports that she does not wish to hurt herself but is concerned that she will.  She does admit to still using cocaine intermittently but denies any heroin use at this time.  She is currently medically clear at this time.  We will have behavioral health consult.  Patient does not meet any criteria for medical admission at this time.        Final  Clinical Impression(s) / ED Diagnoses Final diagnoses:  Fall, initial encounter  Cervical strain, acute, initial encounter  Lumbar contusion, initial encounter  Suicidal behavior without attempted self-injury    Rx / DC Orders ED Discharge Orders     None  Blanchie Dessert, MD 07/15/21 [phone]
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
## 2021-07-15 NOTE — ED Notes (Signed)
Pt was helped to shower by [place] NT. Pt stated she hadn't showered in a month. Pt was given 2 sandwiches, along with 5 cheese sticks, 10 crackers, 4 peanut butters, 3 applesauces. Pt states she hasn't eaten in 3 days.

## 2021-07-15 NOTE — ED Notes (Signed)
Pt ambulatory to shower

## 2021-07-15 NOTE — ED Triage Notes (Signed)
Pt BIBA from a friend's house. Pt was walking down stairs and slipped, going down 3 steps. Pt hit back of neck, lower back, right flank. Pt was ambulatory on scene.

## 2021-07-15 NOTE — ED Notes (Signed)
Pt placed on 2l  for desats to 89%

## 2021-07-15 NOTE — ED Notes (Signed)
Belongings in locker 8. Pt provided dinner tray

## 2021-07-15 NOTE — ED Notes (Signed)
TTS in progress 

## 2021-07-15 NOTE — ED Notes (Signed)
Belongings form signed by pt and [place] NT. Belongings moved to back lockers.

## 2021-07-15 NOTE — ED Notes (Signed)
Reports noncompliance with medications for several months and has been using cocaine instead. Had a fall at home today and decided to get some help. Admits depression and hopelessness. Alert, oriented and cooperative. Dressed into hospital provided blue paper scrubs. Belongings previously gathered and paced in secure holding. Snack and PM medications administered.

## 2021-07-15 NOTE — ED Notes (Signed)
Unfortunately, there are no burgundy scrubs that will fit the pt. Pt attempting to fit into XXL blue scrubs.

## 2021-07-15 NOTE — ED Notes (Signed)
Pt changed into blue paper scrubs. All patient belongings removed from room, bagged with label, and security wanded. Belongings are at green nurses station.

## 2021-07-16 DIAGNOSIS — F1994 Other psychoactive substance use, unspecified with psychoactive substance-induced mood disorder: Secondary | ICD-10-CM

## 2021-07-16 LAB — HEPATIC FUNCTION PANEL
ALT: 12 U/L (ref 0–44)
AST: 18 U/L (ref 15–41)
Albumin: 2.8 g/dL — ABNORMAL LOW (ref 3.5–5.0)
Alkaline Phosphatase: 51 U/L (ref 38–126)
Bilirubin, Direct: 0.2 mg/dL (ref 0.0–0.2)
Indirect Bilirubin: 0.6 mg/dL (ref 0.3–0.9)
Total Bilirubin: 0.8 mg/dL (ref 0.3–1.2)
Total Protein: 7.5 g/dL (ref 6.5–8.1)

## 2021-07-16 MED ORDER — MELATONIN 3 MG PO TABS
3.0000 mg | ORAL_TABLET | Freq: Every day | ORAL | Status: DC
  Administered 2021-07-16: 3 mg via ORAL
  Filled 2021-07-16: qty 1

## 2021-07-16 MED ORDER — ACETAMINOPHEN 325 MG PO TABS
650.0000 mg | ORAL_TABLET | Freq: Four times a day (QID) | ORAL | Status: DC | PRN
Start: 2021-07-16 — End: 2021-07-16
  Administered 2021-07-16 (×2): 650 mg via ORAL
  Filled 2021-07-16 (×2): qty 2

## 2021-07-16 NOTE — ED Provider Notes (Signed)
Emergency Medicine Observation Re-evaluation Note  Lei Dower is a 41 y.o. female, seen on rounds today.  Pt initially presented to the ED for complaints of Fall Currently, the patient is sleeping.  Physical Exam  BP 97/68    Pulse 79    Temp 98 F (36.7 C)    Resp (!) 22    Ht 5\' 4"  (1.626 m)    Wt 124.7 kg    LMP 07/01/2021    SpO2 93%    BMI 47.20 kg/m  Physical Exam General: Sleeping, nondistressed Cardiac: Normal rate Lungs: Breathing is unlabored Psych: Deferred  ED Course / MDM  EKG:   I have reviewed the labs performed to date as well as medications administered while in observation.  Recent changes in the last 24 hours include patient presented last night for a fall.  She subsequently endorsed present thoughts and asked for mental health evaluation.  She had concerns that she might hurt herself.  She was medically cleared.  TTS evaluated and recommends ED observation overnight.  Plan  Current plan is for TTS reevaluation.  Ridhi Hoffert is not under involuntary commitment.     Godfrey Pick, MD 07/16/21 2039

## 2021-07-16 NOTE — Care Management (Signed)
Patient will be transported with safe transport to [facility], [address] [place], [place] she will be met by staff there who will then transport her to the shelter.Will call safe transport when DC

## 2021-07-16 NOTE — ED Notes (Signed)
Patient given discharge instructions, all questions answered. Patient in possession of all belongings, patient escorted to car which [facility] will take her to shelter.

## 2021-07-16 NOTE — ED Notes (Signed)
Breakfast ordered 

## 2021-07-16 NOTE — ED Notes (Signed)
The pt has eaten and drank all night

## 2021-07-16 NOTE — ED Notes (Signed)
Patient on TTS 

## 2021-07-16 NOTE — ED Notes (Signed)
Dinner has arrived °
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
## 2021-07-16 NOTE — ED Notes (Signed)
Pt ,gets hostile when she does not get her way.  She reports that she just wants to sleep  has not slept for 10 days asking for pain med and melatonin

## 2021-07-16 NOTE — Consult Note (Signed)
Telepsych Consultation   Reason for Consult:  Psychiatric Reassessment Referring Physician:  Dr. Blanchie Dessert Location of Patient:    Sarah Cervantes  Location of Provider: Other: virtual home office  Patient Identification: Sarah Cervantes MRN:  [id number] Principal Diagnosis: Substance induced mood disorder ([place]) Diagnosis:  Principal Problem:   Substance induced mood disorder ([place]) Active Problems:   Polysubstance dependence including opioid drug with daily use ([place])   PTSD (post-traumatic stress disorder)   Bipolar I disorder, current or most recent episode depressed, with psychotic features ([place])   Total Time spent with patient: 30 minutes  Subjective:   Sarah Cervantes is a 41 y.o. female patient who came to the hospital for evaluation s/p fall.  During assessment she reported chronic suicidal ideations and was referred to psychiatry. Today the patient is pleasant and cooperative, intermittently smiles during assessment and states,"I've suicidal thoughts for years but I'm not going to do anything to hurt myself."   HPI: Patient seen via telepsych by this provider; chart reviewed and consulted with Dr. Dwyane Dee on 07/16/21.  On evaluation Sarah Cervantes reports is laying in hospital bed, reports some back discomfort secondary to a fall 2 days ago, prior to admission.  Per nursing notes, she is able to get around and independently care for herself.  States is clear and coherent, A&Ox4.  She is fairly groomed groomed in hospital scrubs. She reports a she is depressed, sad and has suicidal ideations triggered by her drug use. Her SI is passive, she describes thoughts of wanting to go to sleep and not wake up but denies plan or intent or access to means.  Patient reports her family as protective factors against self harm, and relates they are the reason she wants to get her life together.     Pt reports a history for cocaine use that started when she cocaine since the age of 76, states  she was introduced to it by her mother.  Since then has had substance abuse concerns.  Reports several inpatient psychiatric admissions, cannot name how many but most recent was in 2019, for substance induced mood disorder.  Reports several attempts at sobriety, last rehab treatment was in 2022 for 10 months at [facility] in [place], [place].  States she was doing good, restated on medications, Abilify 5mg  po daily; Duloxetine 30mg  po daily; Gabapentin 400mg  po TID; hydroxysine 25mg  po TID prn anxiety; Topamax 50mg  po qhs; Trazodone .100mg  po sleep. Relapsed when she went to visit a friend who got out of prison.  States medications helped improve her mood, but she abruptly stopped taking prescribed medications because she was self medicating with cocaine. She is willing to restart medications but states she does not like trazodone due to side effects, "didn't like the way it made me feel, funny."      Past Psychiatric History: as outlined below  Risk to Self:  no Risk to Others:  no Prior Inpatient Therapy: yes  Prior Outpatient Therapy:  yes  Past Medical History:  Past Medical History:  Diagnosis Date   Cocaine abuse, daily use ([place]) 03/23/2018   ETOH abuse 03/23/2018   Heroin abuse ([place]) 03/23/2018   History reviewed. No pertinent surgical history. Family History: History reviewed. No pertinent family history. Family Psychiatric  History: unknown Social History:  Social History   Substance and Sexual Activity  Alcohol Use Yes   Alcohol/week: 12.0 - 15.0 standard drinks   Types: 12 - 15 Cans of beer per week   Comment: drinking  daily for the past week     Social History   Substance and Sexual Activity  Drug Use Yes   Types: Cocaine   Comment: Heroin and Cocain on 2018/03/23. Pt uses every night    Social History   Socioeconomic History   Marital status: Single    Spouse name: Not on file   Number of children: Not on file   Years of education: Not on file   Highest education  level: Not on file  Occupational History   Not on file  Tobacco Use   Smoking status: Every Day    Packs/day: 0.50    Years: 30.00    Pack years: 15.00    Types: Cigarettes   Smokeless tobacco: Never  Vaping Use   Vaping Use: Never used  Substance and Sexual Activity   Alcohol use: Yes    Alcohol/week: 12.0 - 15.0 standard drinks    Types: 12 - 15 Cans of beer per week    Comment: drinking daily for the past week   Drug use: Yes    Types: Cocaine    Comment: Heroin and Cocain on 2018/03/23. Pt uses every night   Sexual activity: Yes    Birth control/protection: None  Other Topics Concern   Not on file  Social History Narrative   Not on file   Social Determinants of Health   Financial Resource Strain: Not on file  Food Insecurity: Not on file  Transportation Needs: Not on file  Physical Activity: Not on file  Stress: Not on file  Social Connections: Not on file   Additional Social History:    Allergies:   Allergies  Allergen Reactions   Latex Swelling and Rash    Hands swell    Labs:  Results for orders placed or performed during the hospital encounter of 07/15/21 (from the past 48 hour(s))  Ethanol     Status: None   Collection Time: 07/15/21  2:40 PM  Result Value Ref Range   Alcohol, Ethyl (B) <10 <10 mg/dL    Comment: (NOTE) Lowest detectable limit for serum alcohol is 10 mg/dL.  For medical purposes only. Performed at [facility], [place] [address], [place], [place] [address]   I-Stat Beta hCG blood, ED ([facility], [facility], [facility] only)     Status: None   Collection Time: 07/15/21  2:44 PM  Result Value Ref Range   I-stat hCG, quantitative <5.0 <5 mIU/mL   Comment 3            Comment:   GEST. AGE      CONC.  (mIU/mL)   <=1 WEEK        5 - 50     2 WEEKS       50 - 500     3 WEEKS       100 - 10,000     4 WEEKS     1,000 - 30,000        FEMALE AND NON-PREGNANT FEMALE:     LESS THAN 5 mIU/mL   CBC with Differential/Platelet     Status: Abnormal    Collection Time: 07/15/21  2:57 PM  Result Value Ref Range   WBC 9.0 4.0 - 10.5 K/uL   RBC 4.67 3.87 - 5.11 MIL/uL   Hemoglobin 13.9 12.0 - 15.0 g/dL   HCT 44.2 36.0 - 46.0 %   MCV 94.6 80.0 - 100.0 fL   MCH 29.8 26.0 - 34.0 pg   MCHC 31.4  30.0 - 36.0 g/dL   RDW 16.7 (H) 11.5 - 15.5 %   Platelets 430 (H) 150 - 400 K/uL   nRBC 0.0 0.0 - 0.2 %   Neutrophils Relative % 50 %   Neutro Abs 4.5 1.7 - 7.7 K/uL   Lymphocytes Relative 36 %   Lymphs Abs 3.2 0.7 - 4.0 K/uL   Monocytes Relative 11 %   Monocytes Absolute 1.0 0.1 - 1.0 K/uL   Eosinophils Relative 2 %   Eosinophils Absolute 0.2 0.0 - 0.5 K/uL   Basophils Relative 0 %   Basophils Absolute 0.0 0.0 - 0.1 K/uL   Immature Granulocytes 1 %   Abs Immature Granulocytes 0.06 0.00 - 0.07 K/uL    Comment: Performed at [facility] [address], [place], [place] [address]  Basic metabolic panel     Status: Abnormal   Collection Time: 07/15/21  2:57 PM  Result Value Ref Range   Sodium 137 135 - 145 mmol/L   Potassium 4.3 3.5 - 5.1 mmol/L   Chloride 104 98 - 111 mmol/L   CO2 26 22 - 32 mmol/L   Glucose, Bld 97 70 - 99 mg/dL    Comment: Glucose reference range applies only to samples taken after fasting for at least 8 hours.   BUN 8 6 - 20 mg/dL   Creatinine, Ser 0.92 0.44 - 1.00 mg/dL   Calcium 8.4 (L) 8.9 - 10.3 mg/dL   GFR, Estimated >60 >60 mL/min    Comment: (NOTE) Calculated using the CKD-EPI Creatinine Equation (2021)    Anion gap 7 5 - 15    Comment: Performed at [facility] [address] [place], [place] [address]  Resp Panel by RT-PCR (Flu A&B, Covid) Nasopharyngeal Swab     Status: None   Collection Time: 07/15/21  4:03 PM   Specimen: Nasopharyngeal Swab; Nasopharyngeal(NP) swabs in vial transport medium  Result Value Ref Range   SARS Coronavirus 2 by RT PCR NEGATIVE NEGATIVE    Comment: (NOTE) SARS-CoV-2 target nucleic acids are NOT DETECTED.  The SARS-CoV-2 RNA is generally detectable in upper  respiratory specimens during the acute phase of infection. The lowest concentration of SARS-CoV-2 viral copies this assay can detect is 138 copies/mL. A negative result does not preclude SARS-Cov-2 infection and should not be used as the sole basis for treatment or other patient management decisions. A negative result may occur with  improper specimen collection/handling, submission of specimen other than nasopharyngeal swab, presence of viral mutation(s) within the areas targeted by this assay, and inadequate number of viral copies(<138 copies/mL). A negative result must be combined with clinical observations, patient history, and epidemiological information. The expected result is Negative.  Fact Sheet for Patients:  EntrepreneurPulse.com.au  Fact Sheet for Healthcare Providers:  IncredibleEmployment.be  This test is no t yet approved or cleared by the [place] FDA and  has been authorized for detection and/or diagnosis of SARS-CoV-2 by FDA under an Emergency Use Authorization (EUA). This EUA will remain  in effect (meaning this test can be used) for the duration of the COVID-19 declaration under Section 564(b)(1) of the Act, 21 U.S.C.section 360bbb-3(b)(1), unless the authorization is terminated  or revoked sooner.       Influenza A by PCR NEGATIVE NEGATIVE   Influenza B by PCR NEGATIVE NEGATIVE    Comment: (NOTE) The Xpert Xpress SARS-CoV-2/FLU/RSV plus assay is intended as an aid in the diagnosis of influenza from Nasopharyngeal swab specimens and should not  be used as a sole basis for treatment. Nasal washings and aspirates are unacceptable for Xpert Xpress SARS-CoV-2/FLU/RSV testing.  Fact Sheet for Patients: EntrepreneurPulse.com.au  Fact Sheet for Healthcare Providers: IncredibleEmployment.be  This test is not yet approved or cleared by the [place] FDA and has been authorized for  detection and/or diagnosis of SARS-CoV-2 by FDA under an Emergency Use Authorization (EUA). This EUA will remain in effect (meaning this test can be used) for the duration of the COVID-19 declaration under Section 564(b)(1) of the Act, 21 U.S.C. section 360bbb-3(b)(1), unless the authorization is terminated or revoked.  Performed at [facility], [place] [address], [place], [place] [address]   Rapid urine drug screen (hospital performed)     Status: Abnormal   Collection Time: 07/15/21  5:01 PM  Result Value Ref Range   Opiates POSITIVE (A) NONE DETECTED   Cocaine POSITIVE (A) NONE DETECTED   Benzodiazepines NONE DETECTED NONE DETECTED   Amphetamines NONE DETECTED NONE DETECTED   Tetrahydrocannabinol POSITIVE (A) NONE DETECTED   Barbiturates NONE DETECTED NONE DETECTED    Comment: (NOTE) DRUG SCREEN FOR MEDICAL PURPOSES ONLY.  IF CONFIRMATION IS NEEDED FOR ANY PURPOSE, NOTIFY LAB WITHIN 5 DAYS.  LOWEST DETECTABLE LIMITS FOR URINE DRUG SCREEN Drug Class                     Cutoff (ng/mL) Amphetamine and metabolites    1000 Barbiturate and metabolites    200 Benzodiazepine                 353 Tricyclics and metabolites     300 Opiates and metabolites        300 Cocaine and metabolites        300 THC                            50 Performed at [facility], [place] [address] [place], [place] [address]     Medications:  Current Facility-Administered Medications  Medication Dose Route Frequency Provider Last Rate Last Admin   acetaminophen (TYLENOL) tablet 650 mg  650 mg Oral Q6H PRN Ripley Fraise, MD   650 mg at 07/16/21 1045   ARIPiprazole (ABILIFY) tablet 5 mg  5 mg Oral Daily Blanchie Dessert, MD   5 mg at 07/16/21 1005   DULoxetine (CYMBALTA) DR capsule 30 mg  30 mg Oral Daily Blanchie Dessert, MD   30 mg at 07/16/21 1005   enalapril (VASOTEC) tablet 5 mg  5 mg Oral Daily Blanchie Dessert, MD   5 mg at 07/15/21 2004   gabapentin (NEURONTIN) capsule 400 mg   400 mg Oral TID Blanchie Dessert, MD   400 mg at 07/16/21 1600   hydrOXYzine (ATARAX) tablet 50 mg  50 mg Oral TID PRN Blanchie Dessert, MD   50 mg at 07/15/21 1751   melatonin tablet 3 mg  3 mg Oral QHS Ripley Fraise, MD   3 mg at 07/16/21 0406   nicotine (NICODERM CQ - dosed in mg/24 hours) patch 21 mg  21 mg Transdermal Daily Plunkett, Whitney, MD       prazosin (MINIPRESS) capsule 1 mg  1 mg Oral QHS Plunkett, Loree Fee, MD   1 mg at 07/15/21 2156   topiramate (TOPAMAX) tablet 50 mg  50 mg Oral QHS Blanchie Dessert, MD   50 mg at 07/15/21 2157   traZODone (DESYREL) tablet 100 mg  100 mg Oral  QHS,MR X 1 Plunkett, Loree Fee, MD   100 mg at 07/15/21 2156   Current Outpatient Medications  Medication Sig Dispense Refill   ARIPiprazole (ABILIFY) 5 MG tablet Take 1 tablet (5 mg total) by mouth daily. (Patient not taking: Reported on 07/15/2021) 30 tablet 0   DULoxetine (CYMBALTA) 30 MG capsule Take 1 capsule (30 mg total) by mouth daily. (Patient not taking: Reported on 07/15/2021) 30 capsule 0   enalapril (VASOTEC) 5 MG tablet Take 1 tablet (5 mg total) by mouth daily. (Patient not taking: Reported on 07/15/2021) 30 tablet 0   gabapentin (NEURONTIN) 400 MG capsule Take 1 capsule (400 mg total) by mouth 3 (three) times daily. 90 capsule 0   hydrocortisone (ANUSOL-HC) 2.5 % rectal cream Place rectally 3 (three) times daily. (Patient not taking: Reported on 07/15/2021) 30 g 0   hydrOXYzine (ATARAX/VISTARIL) 50 MG tablet Take 1 tablet (50 mg total) by mouth 3 (three) times daily as needed for anxiety. (Patient not taking: Reported on 07/15/2021) 30 tablet 0   nicotine polacrilex (NICORETTE) 2 MG gum Take 1 each (2 mg total) by mouth as needed for smoking cessation. (Patient not taking: Reported on 07/15/2021) 100 tablet 0   prazosin (MINIPRESS) 1 MG capsule Take 1 capsule (1 mg total) by mouth at bedtime. (Patient not taking: Reported on 07/15/2021) 30 capsule 0   topiramate (TOPAMAX) 50 MG tablet Take 1  tablet (50 mg total) by mouth at bedtime. (Patient not taking: Reported on 07/15/2021) 30 tablet 0   traZODone (DESYREL) 100 MG tablet Take 1 tablet (100 mg total) by mouth at bedtime and may repeat dose one time if needed. (Patient not taking: Reported on 07/15/2021) 30 tablet 0    Musculoskeletal: Per hospital record, pt has back pain but no ambulatory concerns.  Strength & Muscle Tone: within normal limits Gait & Station: normal Patient leans: N/A    Psychiatric Specialty Exam:  Presentation  General Appearance: Appropriate for Environment; Casual  Eye Contact:Good  Speech:Clear and Coherent; Normal Rate  Speech Volume:Normal  Handedness:Right   Mood and Affect  Mood:Depressed; Worthless  Affect:Appropriate; Congruent; Depressed   Thought Process  Thought Processes:Coherent; Goal Directed  Descriptions of Associations:Intact  Orientation:Full (Time, Place and Person)  Thought Content:Logical  History of Schizophrenia/Schizoaffective disorder:No  Duration of Psychotic Symptoms:N/A  Hallucinations:Hallucinations: None  Ideas of Reference:None  Suicidal Thoughts:Suicidal Thoughts: Yes, Passive SI Passive Intent and/or Plan: Without Intent; Without Plan; Without Means to Carry Out  Homicidal Thoughts:Homicidal Thoughts: No   Sensorium  Memory:Immediate Good; Recent Good; Remote Fair  Judgment:Fair (at baseline based on chronic cocaine abuse)  Insight:Fair   Executive Functions  Concentration:Good  Attention Span:Good  [place] of Knowledge:Good  Language:Good   Psychomotor Activity  Psychomotor Activity:Psychomotor Activity: Normal   Assets  Assets:Communication Skills; Desire for Improvement   Sleep  Sleep:Sleep: Good Number of Hours of Sleep: 6    Physical Exam: Physical Exam Constitutional:      Appearance: Normal appearance. She is obese.  Cardiovascular:     Rate and Rhythm: Normal rate.     Pulses: Normal  pulses.  Pulmonary:     Effort: Pulmonary effort is normal.  Musculoskeletal:     Cervical back: Normal range of motion.  Neurological:     General: No focal deficit present.     Mental Status: She is alert and oriented to person, place, and time.  Psychiatric:        Attention and Perception: Attention normal.  Mood and Affect: Mood is depressed. Affect is flat.        Speech: Speech normal.        Behavior: Behavior normal. Behavior is cooperative.        Thought Content: Thought content is not paranoid or delusional. Thought content includes suicidal (ideations that are chronic and passive) ideation. Thought content does not include homicidal ideation. Thought content does not include homicidal or suicidal plan.        Cognition and Memory: Cognition and memory normal.        Judgment: Judgment is impulsive (can for impulsve with drug use).   Review of Systems  Constitutional: Negative.   HENT: Negative.    Eyes: Negative.   Cardiovascular: Negative.   Gastrointestinal: Negative.   Genitourinary: Negative.   Musculoskeletal:  Positive for back pain (s/p fall 2 days prior).  Skin: Negative.   Neurological: Negative.   Endo/Heme/Allergies: Negative.   Psychiatric/Behavioral:  Positive for depression, substance abuse and suicidal ideas. Negative for hallucinations. The patient is not nervous/anxious and does not have insomnia.   Blood pressure 114/72, pulse 75, temperature 98 F (36.7 C), resp. rate 20, height 5\' 4"  (1.626 m), weight 124.7 kg, last menstrual period 07/01/2021, SpO2 96 %. Body mass index is 47.2 kg/m.  Treatment Plan Summary: Patient has chronic suicidal ideations that are passive, no plan or intent.  At baseline she has bipolar 1 disorder but not taking prescribed medications; hx of alcohol abuse, polysubstance abuse, PTSD which exacerbates her presentation.  Pt presents calm, is cooperative and very polite.  There are no grounds for involuntary commitment but  she would do well with transferring to [facility] when she can restart her medications for mood stability.  This was discussed with the patient who agrees to this.   Labs: pregnancy test is negative; negative infectious disease panel; LFTs were not ordered; UDS positive for opiates, cocaine, THC.   Recommend EKG and LFT panel prior to restarting psychiatric medications.  As she has been off medications for several months, would recommend starting a lower dose.    Daily contact with patient to assess and evaluate symptoms and progress in treatment and Medication management  Disposition: No evidence of imminent risk to self or others at present.    This service was provided via telemedicine using a 2-way, interactive audio and video technology.  Names of all persons participating in this telemedicine service and their role in this encounter. Name: [name] Role: Patient  Name: Merlyn Lot Role: [place]  Name: Cloyde Reams [place]  Role: Psychiatrist    Mallie Darting, NP 07/16/2021 4:06 PM

## 2021-07-16 NOTE — ED Notes (Signed)
Patient ambulatory without assistance.

## 2021-07-16 NOTE — ED Notes (Signed)
The pt rewports that she needs something for sleep and denied that she was asleep x 3 when she rang her bell  and I came to her door asking if she needed help  she retorted that she was awake the entire time I asked if so whay not answer me  no response  I went into give her this last med and stood by her bed at least for 5 minutes as she slept soundly I finally had to call her name and touch her arm to wake her

## 2021-07-16 NOTE — Progress Notes (Signed)
CSW spoke with Butch Penny with [facility] of the [place] [facility]/Phone#: [phone] option 2; to assist with finding the patient a safe space to stay. It was reported that the [facility] and [facility] currently have no available beds, however she will reach out the patient through the patient's nurse to attempt to make contact with the patient. CSW provided the nurses listed contacted information, so the the patient can be reached.   Glennie Isle, MSW, [place], LCAS-A Phone: [phone] Disposition/TOC

## 2021-07-16 NOTE — ED Notes (Signed)
Pt up in bed eating breakfast. No other needs from pt.

## 2021-07-16 NOTE — ED Notes (Signed)
[place]  given pt

## 2022-03-14 ENCOUNTER — Other Ambulatory Visit: Payer: Self-pay

## 2022-03-14 ENCOUNTER — Emergency Department (HOSPITAL_COMMUNITY): Payer: 59

## 2022-03-14 ENCOUNTER — Encounter (HOSPITAL_COMMUNITY): Payer: Self-pay | Admitting: *Deleted

## 2022-03-14 ENCOUNTER — Observation Stay (HOSPITAL_COMMUNITY)
Admission: EM | Admit: 2022-03-14 | Discharge: 2022-03-16 | Disposition: A | Discharge status: Home / Self Care | Payer: 59 | Attending: Internal Medicine | Admitting: Internal Medicine

## 2022-03-14 DIAGNOSIS — E8770 Fluid overload, unspecified: Secondary | ICD-10-CM | POA: Diagnosis present

## 2022-03-14 DIAGNOSIS — Z87891 Personal history of nicotine dependence: Secondary | ICD-10-CM

## 2022-03-14 DIAGNOSIS — S22070A Wedge compression fracture of T9-T10 vertebra, initial encounter for closed fracture: Secondary | ICD-10-CM

## 2022-03-14 DIAGNOSIS — J9601 Acute respiratory failure with hypoxia: Secondary | ICD-10-CM | POA: Diagnosis present

## 2022-03-14 DIAGNOSIS — Y9259 Other trade areas as the place of occurrence of the external cause: Secondary | ICD-10-CM

## 2022-03-14 DIAGNOSIS — S0990XA Unspecified injury of head, initial encounter: Secondary | ICD-10-CM

## 2022-03-14 DIAGNOSIS — Y93E1 Activity, personal bathing and showering: Secondary | ICD-10-CM

## 2022-03-14 DIAGNOSIS — Z79899 Other long term (current) drug therapy: Secondary | ICD-10-CM | POA: Insufficient documentation

## 2022-03-14 DIAGNOSIS — R55 Syncope and collapse: Principal | ICD-10-CM | POA: Insufficient documentation

## 2022-03-14 DIAGNOSIS — W182XXA Fall in (into) shower or empty bathtub, initial encounter: Secondary | ICD-10-CM | POA: Diagnosis present

## 2022-03-14 DIAGNOSIS — Z6841 Body Mass Index (BMI) 40.0 and over, adult: Secondary | ICD-10-CM

## 2022-03-14 DIAGNOSIS — F1414 Cocaine abuse with cocaine-induced mood disorder: Secondary | ICD-10-CM | POA: Diagnosis not present

## 2022-03-14 DIAGNOSIS — Z7985 Long-term (current) use of injectable non-insulin antidiabetic drugs: Secondary | ICD-10-CM

## 2022-03-14 DIAGNOSIS — R0902 Hypoxemia: Secondary | ICD-10-CM

## 2022-03-14 DIAGNOSIS — F1994 Other psychoactive substance use, unspecified with psychoactive substance-induced mood disorder: Secondary | ICD-10-CM | POA: Diagnosis present

## 2022-03-14 DIAGNOSIS — E119 Type 2 diabetes mellitus without complications: Secondary | ICD-10-CM | POA: Diagnosis present

## 2022-03-14 DIAGNOSIS — Z91040 Latex allergy status: Secondary | ICD-10-CM

## 2022-03-14 DIAGNOSIS — M549 Dorsalgia, unspecified: Secondary | ICD-10-CM | POA: Insufficient documentation

## 2022-03-14 DIAGNOSIS — Z8616 Personal history of COVID-19: Secondary | ICD-10-CM

## 2022-03-14 DIAGNOSIS — F419 Anxiety disorder, unspecified: Secondary | ICD-10-CM | POA: Diagnosis present

## 2022-03-14 DIAGNOSIS — W19XXXA Unspecified fall, initial encounter: Secondary | ICD-10-CM

## 2022-03-14 DIAGNOSIS — F319 Bipolar disorder, unspecified: Secondary | ICD-10-CM | POA: Diagnosis not present

## 2022-03-14 DIAGNOSIS — G4730 Sleep apnea, unspecified: Secondary | ICD-10-CM | POA: Diagnosis present

## 2022-03-14 DIAGNOSIS — F4310 Post-traumatic stress disorder, unspecified: Secondary | ICD-10-CM | POA: Insufficient documentation

## 2022-03-14 DIAGNOSIS — E8809 Other disorders of plasma-protein metabolism, not elsewhere classified: Secondary | ICD-10-CM | POA: Diagnosis present

## 2022-03-14 DIAGNOSIS — F315 Bipolar disorder, current episode depressed, severe, with psychotic features: Secondary | ICD-10-CM | POA: Diagnosis present

## 2022-03-14 DIAGNOSIS — K5900 Constipation, unspecified: Secondary | ICD-10-CM | POA: Diagnosis present

## 2022-03-14 DIAGNOSIS — S22079A Unspecified fracture of T9-T10 vertebra, initial encounter for closed fracture: Secondary | ICD-10-CM | POA: Diagnosis present

## 2022-03-14 LAB — URINALYSIS, ROUTINE W REFLEX MICROSCOPIC
Bilirubin Urine: NEGATIVE
Glucose, UA: NEGATIVE mg/dL
Hgb urine dipstick: NEGATIVE
Ketones, ur: NEGATIVE mg/dL
Nitrite: NEGATIVE
Protein, ur: NEGATIVE mg/dL
Specific Gravity, Urine: 1.027 (ref 1.005–1.030)
pH: 5 (ref 5.0–8.0)

## 2022-03-14 LAB — CBC WITH DIFFERENTIAL/PLATELET
Abs Immature Granulocytes: 0.02 10*3/uL (ref 0.00–0.07)
Basophils Absolute: 0 10*3/uL (ref 0.0–0.1)
Basophils Relative: 0 %
Eosinophils Absolute: 0.4 10*3/uL (ref 0.0–0.5)
Eosinophils Relative: 6 %
HCT: 40.7 % (ref 36.0–46.0)
Hemoglobin: 12.8 g/dL (ref 12.0–15.0)
Immature Granulocytes: 0 %
Lymphocytes Relative: 29 %
Lymphs Abs: 1.9 10*3/uL (ref 0.7–4.0)
MCH: 31.5 pg (ref 26.0–34.0)
MCHC: 31.4 g/dL (ref 30.0–36.0)
MCV: 100.2 fL — ABNORMAL HIGH (ref 80.0–100.0)
Monocytes Absolute: 0.5 10*3/uL (ref 0.1–1.0)
Monocytes Relative: 7 %
Neutro Abs: 3.7 10*3/uL (ref 1.7–7.7)
Neutrophils Relative %: 58 %
Platelets: 280 10*3/uL (ref 150–400)
RBC: 4.06 MIL/uL (ref 3.87–5.11)
RDW: 14.6 % (ref 11.5–15.5)
WBC: 6.5 10*3/uL (ref 4.0–10.5)
nRBC: 0 % (ref 0.0–0.2)

## 2022-03-14 LAB — COMPREHENSIVE METABOLIC PANEL
ALT: 36 U/L (ref 0–44)
AST: 18 U/L (ref 15–41)
Albumin: 3.3 g/dL — ABNORMAL LOW (ref 3.5–5.0)
Alkaline Phosphatase: 46 U/L (ref 38–126)
Anion gap: 6 (ref 5–15)
BUN: 15 mg/dL (ref 6–20)
CO2: 29 mmol/L (ref 22–32)
Calcium: 8.1 mg/dL — ABNORMAL LOW (ref 8.9–10.3)
Chloride: 106 mmol/L (ref 98–111)
Creatinine, Ser: 0.74 mg/dL (ref 0.44–1.00)
GFR, Estimated: >60 mL/min (ref >60)
Glucose, Bld: 99 mg/dL (ref 70–99)
Potassium: 3.8 mmol/L (ref 3.5–5.1)
Sodium: 141 mmol/L (ref 135–145)
Total Bilirubin: 0.5 mg/dL (ref 0.3–1.2)
Total Protein: 6.4 g/dL — ABNORMAL LOW (ref 6.5–8.1)

## 2022-03-14 LAB — TROPONIN I (HIGH SENSITIVITY): Troponin I (High Sensitivity): 5 ng/L (ref <18)

## 2022-03-14 LAB — I-STAT BETA HCG BLOOD, ED (MC, WL, AP ONLY): I-stat hCG, quantitative: <5 m[IU]/mL (ref <5)

## 2022-03-14 LAB — RAPID URINE DRUG SCREEN, HOSP PERFORMED
Amphetamines: POSITIVE — AB
Barbiturates: POSITIVE — AB
Benzodiazepines: NOT DETECTED
Cocaine: POSITIVE — AB
Opiates: NOT DETECTED
Tetrahydrocannabinol: POSITIVE — AB

## 2022-03-14 LAB — ETHANOL: Alcohol, Ethyl (B): <10 mg/dL (ref <10)

## 2022-03-14 LAB — CBG MONITORING, ED: Glucose-Capillary: 111 mg/dL — ABNORMAL HIGH (ref 70–99)

## 2022-03-14 MED ORDER — HYDROXYZINE HCL 50 MG PO TABS
50.0000 mg | ORAL_TABLET | Freq: Three times a day (TID) | ORAL | Status: DC | PRN
  Administered 2022-03-14: 50 mg via ORAL
  Filled 2022-03-14: qty 2

## 2022-03-14 MED ORDER — TRAZODONE HCL 100 MG PO TABS
100.0000 mg | ORAL_TABLET | Freq: Every evening | ORAL | Status: DC | PRN
  Administered 2022-03-14 – 2022-03-15 (×2): 100 mg via ORAL
  Filled 2022-03-14 (×2): qty 1

## 2022-03-14 MED ORDER — SENNA 8.6 MG PO TABS
1.0000 | ORAL_TABLET | Freq: Two times a day (BID) | ORAL | Status: DC
  Administered 2022-03-14 – 2022-03-16 (×4): 8.6 mg via ORAL
  Filled 2022-03-14 (×4): qty 1

## 2022-03-14 MED ORDER — INSULIN ASPART 100 UNIT/ML IJ SOLN
0.0000 [IU] | Freq: Every day | INTRAMUSCULAR | Status: DC
  Filled 2022-03-14: qty 0.05

## 2022-03-14 MED ORDER — BISACODYL 10 MG RE SUPP: 10.0000 mg | Freq: Every day | RECTAL | Status: DC | PRN

## 2022-03-14 MED ORDER — ACETAMINOPHEN 325 MG PO TABS: 650.0000 mg | ORAL_TABLET | Freq: Four times a day (QID) | ORAL | Status: DC | PRN

## 2022-03-14 MED ORDER — DOCUSATE SODIUM 100 MG PO CAPS
100.0000 mg | ORAL_CAPSULE | Freq: Two times a day (BID) | ORAL | Status: DC
  Administered 2022-03-14 – 2022-03-16 (×4): 100 mg via ORAL
  Filled 2022-03-14 (×4): qty 1

## 2022-03-14 MED ORDER — ONDANSETRON HCL 4 MG/2ML IJ SOLN: 4.0000 mg | Freq: Four times a day (QID) | INTRAMUSCULAR | Status: DC | PRN

## 2022-03-14 MED ORDER — HYDROMORPHONE HCL 1 MG/ML IJ SOLN
0.5000 mg | Freq: Once | INTRAMUSCULAR | Status: AC
  Administered 2022-03-14: 0.5 mg via INTRAVENOUS
  Filled 2022-03-14: qty 1

## 2022-03-14 MED ORDER — ACETAMINOPHEN 650 MG RE SUPP: 650.0000 mg | Freq: Four times a day (QID) | RECTAL | Status: DC | PRN

## 2022-03-14 MED ORDER — OXYCODONE HCL 5 MG PO TABS
5.0000 mg | ORAL_TABLET | Freq: Four times a day (QID) | ORAL | Status: DC | PRN
  Administered 2022-03-14 – 2022-03-16 (×4): 5 mg via ORAL
  Filled 2022-03-14 (×4): qty 1

## 2022-03-14 MED ORDER — KETOROLAC TROMETHAMINE 15 MG/ML IJ SOLN
15.0000 mg | Freq: Once | INTRAMUSCULAR | Status: AC
  Administered 2022-03-14: 15 mg via INTRAVENOUS
  Filled 2022-03-14: qty 1

## 2022-03-14 MED ORDER — GABAPENTIN 400 MG PO CAPS
400.0000 mg | ORAL_CAPSULE | Freq: Three times a day (TID) | ORAL | Status: DC
  Administered 2022-03-14 – 2022-03-16 (×5): 400 mg via ORAL
  Filled 2022-03-14 (×5): qty 1

## 2022-03-14 MED ORDER — ONDANSETRON HCL 4 MG PO TABS: 4.0000 mg | ORAL_TABLET | Freq: Four times a day (QID) | ORAL | Status: DC | PRN

## 2022-03-14 MED ORDER — INSULIN ASPART 100 UNIT/ML IJ SOLN
0.0000 [IU] | Freq: Three times a day (TID) | INTRAMUSCULAR | Status: DC
  Administered 2022-03-15: 2 [IU] via SUBCUTANEOUS
  Filled 2022-03-14: qty 0.15

## 2022-03-14 MED ORDER — LIDOCAINE 5 % EX PTCH
2.0000 | MEDICATED_PATCH | CUTANEOUS | Status: DC
  Administered 2022-03-14 – 2022-03-15 (×2): 2 via TRANSDERMAL
  Filled 2022-03-14 (×2): qty 2

## 2022-03-14 MED ORDER — FUROSEMIDE 10 MG/ML IJ SOLN
40.0000 mg | Freq: Two times a day (BID) | INTRAMUSCULAR | Status: DC
  Administered 2022-03-14 – 2022-03-16 (×4): 40 mg via INTRAVENOUS
  Filled 2022-03-14 (×4): qty 4

## 2022-03-14 MED ORDER — METHOCARBAMOL 500 MG PO TABS
500.0000 mg | ORAL_TABLET | Freq: Three times a day (TID) | ORAL | Status: DC
  Administered 2022-03-14 – 2022-03-16 (×5): 500 mg via ORAL
  Filled 2022-03-14 (×5): qty 1

## 2022-03-14 MED ORDER — KETOROLAC TROMETHAMINE 30 MG/ML IJ SOLN
30.0000 mg | Freq: Four times a day (QID) | INTRAMUSCULAR | Status: DC
  Administered 2022-03-14 – 2022-03-16 (×7): 30 mg via INTRAVENOUS
  Filled 2022-03-14 (×7): qty 1

## 2022-03-14 MED ORDER — ENOXAPARIN SODIUM 60 MG/0.6ML IJ SOSY
60.0000 mg | PREFILLED_SYRINGE | INTRAMUSCULAR | Status: DC
  Administered 2022-03-14 – 2022-03-15 (×2): 60 mg via SUBCUTANEOUS
  Filled 2022-03-14 (×2): qty 0.6

## 2022-03-14 NOTE — ED Provider Notes (Signed)
[facility] DEPT Provider Note   CSN: [id number] Arrival date & time: 03/14/22  1012     History  Chief Complaint  Patient presents with   Back Pain   Fall    Sarah Cervantes is a 41 y.o. female, history of alcohol abuse, cocaine abuse, who presents to the ED secondary to falling at [facility] onto her back about an hour ago.  States that she was taking shower, when she felt a bit dizzy, and fell backwards, hit her head on the toilet, and does not know if she lost any consciousness.  States she has diffuse back pain and neck pain, and aches.  Denies any nausea, vomiting, vision changes.  States the EMTs put her on some oxygen as her O2 was low.  Notes that she had COVID, and was hospitalized recently, however had been weaned off oxygen by the time she was home.  She denies any abdominal pain, pelvic pain, leg pain, chest discomfort.     Home Medications Prior to Admission medications   Medication Sig Start Date End Date Taking? Authorizing Provider  ARIPiprazole (ABILIFY) 5 MG tablet Take 1 tablet (5 mg total) by mouth daily. Patient not taking: Reported on 07/15/2021 04/23/18   Derrill Center, NP  DULoxetine (CYMBALTA) 30 MG capsule Take 1 capsule (30 mg total) by mouth daily. Patient not taking: Reported on 07/15/2021 04/23/18   Derrill Center, NP  enalapril (VASOTEC) 5 MG tablet Take 1 tablet (5 mg total) by mouth daily. Patient not taking: Reported on 07/15/2021 04/23/18   Derrill Center, NP  gabapentin (NEURONTIN) 400 MG capsule Take 1 capsule (400 mg total) by mouth 3 (three) times daily. 04/22/18   Derrill Center, NP  hydrocortisone (ANUSOL-HC) 2.5 % rectal cream Place rectally 3 (three) times daily. Patient not taking: Reported on 07/15/2021 03/26/18   Charlynne Cousins, MD  hydrOXYzine (ATARAX/VISTARIL) 50 MG tablet Take 1 tablet (50 mg total) by mouth 3 (three) times daily as needed for anxiety. Patient not taking: Reported on  07/15/2021 04/22/18   Derrill Center, NP  nicotine polacrilex (NICORETTE) 2 MG gum Take 1 each (2 mg total) by mouth as needed for smoking cessation. Patient not taking: Reported on 07/15/2021 04/22/18   Derrill Center, NP  prazosin (MINIPRESS) 1 MG capsule Take 1 capsule (1 mg total) by mouth at bedtime. Patient not taking: Reported on 07/15/2021 04/03/18   Pennelope Bracken, MD  topiramate (TOPAMAX) 50 MG tablet Take 1 tablet (50 mg total) by mouth at bedtime. Patient not taking: Reported on 07/15/2021 04/03/18   Pennelope Bracken, MD  traZODone (DESYREL) 100 MG tablet Take 1 tablet (100 mg total) by mouth at bedtime and may repeat dose one time if needed. Patient not taking: Reported on 07/15/2021 04/22/18   Derrill Center, NP      Allergies    Latex    Review of Systems   Review of Systems  Respiratory:  Negative for shortness of breath.   Cardiovascular:  Negative for chest pain.  Musculoskeletal:  Positive for back pain, gait problem, neck pain and neck stiffness.    Physical Exam Updated Vital Signs BP 134/88 (BP Location: Right Arm)   Pulse 91   Temp 98.3 F (36.8 C) (Oral)   Resp (!) 21   Ht '5\' 4"'$  (1.626 m)   Wt 136.1 kg   SpO2 93%   BMI 51.49 kg/m  Physical Exam Constitutional:  Appearance: She is obese.  HENT:     Head: Normocephalic.     Right Ear: Tympanic membrane normal.     Left Ear: Tympanic membrane normal.     Nose: Nose normal.  Eyes:     Extraocular Movements: Extraocular movements intact.     Conjunctiva/sclera: Conjunctivae normal.     Pupils: Pupils are equal, round, and reactive to light.  Pulmonary:     Effort: Pulmonary effort is normal.     Breath sounds: Decreased air movement present.     Comments: +2L O2 Abdominal:     General: Abdomen is flat.     Palpations: Abdomen is soft.     Tenderness: There is no abdominal tenderness.  Musculoskeletal:     Comments: +diffuse ttp of C-spine, thoracic, and lumbar spine, no  step-offs noted.   Skin:    General: Skin is warm and dry.  Neurological:     General: No focal deficit present.     Mental Status: She is alert and oriented to person, place, and time.     Cranial Nerves: No cranial nerve deficit.     ED Results / Procedures / Treatments   Labs (all labs ordered are listed, but only abnormal results are displayed) Labs Reviewed  CBC WITH DIFFERENTIAL/PLATELET - Abnormal; Notable for the following components:      Result Value   MCV 100.2 (*)    All other components within normal limits  COMPREHENSIVE METABOLIC PANEL - Abnormal; Notable for the following components:   Calcium 8.1 (*)    Total Protein 6.4 (*)    Albumin 3.3 (*)    All other components within normal limits  URINALYSIS, ROUTINE W REFLEX MICROSCOPIC - Abnormal; Notable for the following components:   Leukocytes,Ua Garey Alleva (*)    Bacteria, UA RARE (*)    All other components within normal limits  ETHANOL  RAPID URINE DRUG SCREEN, HOSP PERFORMED  I-STAT BETA HCG BLOOD, ED ([facility], [facility], [facility] ONLY)  TROPONIN I (HIGH SENSITIVITY)  TROPONIN I (HIGH SENSITIVITY)    EKG EKG Interpretation  Date/Time:  Tuesday March 14 2022 14:47:11 EDT Ventricular Rate:  78 PR Interval:  186 QRS Duration: 83 QT Interval:  386 QTC Calculation: 440 R Axis:   -84 Text Interpretation: Sinus rhythm Left anterior fascicular block Borderline low voltage, extremity leads Abnormal R-wave progression, late transition no significant change since Feb 2023 Confirmed by Sherwood Gambler [phone]) on 03/14/2022 3:10:11 PM  Radiology CT Thoracic Spine Wo Contrast  Result Date: 03/14/2022 CLINICAL DATA:  Back trauma, no prior imaging (Age >= 16y) trauma; fall; trauma; fell on back, severe midline pain EXAM: CT THORACIC AND LUMBAR SPINE WITHOUT CONTRAST TECHNIQUE: Multidetector CT imaging of the thoracic and lumbar spine was performed without contrast. Multiplanar CT image reconstructions were also generated. RADIATION  DOSE REDUCTION: This exam was performed according to the departmental dose-optimization program which includes automated exposure control, adjustment of the mA and/or kV according to patient size and/or use of iterative reconstruction technique. COMPARISON:  CT 07/15/2021. FINDINGS: CT THORACIC SPINE FINDINGS Alignment: Normal. Vertebrae: There is an age-indeterminate T10 compression deformity with 20% height loss. No bony retropulsion. Unchanged anterior superior sclerosis at T12. Congenital variant left first rib. Motion artifact through T10 and T11 limits fine bone and soft-tissue detail. Paraspinal and other soft tissues: Kristianne Albin right pleural effusion. Disc levels: There is moderate multilevel degenerative disc disease with bulky anterolateral osteophytes in the thoracic spine, most prominent at T9-T10 and T10-T11. No visible impingement.  CT LUMBAR SPINE FINDINGS Segmentation: 5 lumbar type vertebrae. Alignment: Normal. Vertebrae: No acute fracture or focal pathologic process. Paraspinal and other soft tissues: Negative. Disc levels: No visible impingement. Mild disc bulging at L3-L4 and L4-L5. Mild multilevel facet arthropathy. IMPRESSION: Age-indeterminate T10 compression deformity with 20% height loss. No bony retropulsion. Motion artifact through the T10-T11 level somewhat limits evaluation. No acute lumbar spine fracture. Lotta Frankenfield right pleural effusion. Electronically Signed   By: Maurine Simmering M.D.   On: 03/14/2022 14:16   CT Lumbar Spine Wo Contrast  Result Date: 03/14/2022 CLINICAL DATA:  Back trauma, no prior imaging (Age >= 16y) trauma; fall; trauma; fell on back, severe midline pain EXAM: CT THORACIC AND LUMBAR SPINE WITHOUT CONTRAST TECHNIQUE: Multidetector CT imaging of the thoracic and lumbar spine was performed without contrast. Multiplanar CT image reconstructions were also generated. RADIATION DOSE REDUCTION: This exam was performed according to the departmental dose-optimization program which  includes automated exposure control, adjustment of the mA and/or kV according to patient size and/or use of iterative reconstruction technique. COMPARISON:  CT 07/15/2021. FINDINGS: CT THORACIC SPINE FINDINGS Alignment: Normal. Vertebrae: There is an age-indeterminate T10 compression deformity with 20% height loss. No bony retropulsion. Unchanged anterior superior sclerosis at T12. Congenital variant left first rib. Motion artifact through T10 and T11 limits fine bone and soft-tissue detail. Paraspinal and other soft tissues: Suhailah Kwan right pleural effusion. Disc levels: There is moderate multilevel degenerative disc disease with bulky anterolateral osteophytes in the thoracic spine, most prominent at T9-T10 and T10-T11. No visible impingement. CT LUMBAR SPINE FINDINGS Segmentation: 5 lumbar type vertebrae. Alignment: Normal. Vertebrae: No acute fracture or focal pathologic process. Paraspinal and other soft tissues: Negative. Disc levels: No visible impingement. Mild disc bulging at L3-L4 and L4-L5. Mild multilevel facet arthropathy. IMPRESSION: Age-indeterminate T10 compression deformity with 20% height loss. No bony retropulsion. Motion artifact through the T10-T11 level somewhat limits evaluation. No acute lumbar spine fracture. Haaris Metallo right pleural effusion. Electronically Signed   By: Maurine Simmering M.D.   On: 03/14/2022 14:16   CT Cervical Spine Wo Contrast  Result Date: 03/14/2022 CLINICAL DATA:  Neck pain after fall. EXAM: CT HEAD WITHOUT CONTRAST CT CERVICAL SPINE WITHOUT CONTRAST TECHNIQUE: Multidetector CT imaging of the head and cervical spine was performed following the standard protocol without intravenous contrast. Multiplanar CT image reconstructions of the cervical spine were also generated. However, visualization of the cervical spine is severely limited due to artifact from body habitus. RADIATION DOSE REDUCTION: This exam was performed according to the departmental dose-optimization program which  includes automated exposure control, adjustment of the mA and/or kV according to patient size and/or use of iterative reconstruction technique. COMPARISON:  July 15, 2021. FINDINGS: CT HEAD FINDINGS Brain: No evidence of acute infarction, hemorrhage, hydrocephalus, extra-axial collection or mass lesion/mass effect. Vascular: No hyperdense vessel or unexpected calcification. Skull: Normal. Negative for fracture or focal lesion. Sinuses/Orbits: No acute finding. Other: None. CT CERVICAL SPINE FINDINGS Alignment: Grossly normal, although limited as described above. Skull base and vertebrae: Fracture cannot be excluded on the basis of this exam due to limited evaluation of the vertebral bodies due to artifact from body habitus. Soft tissues and spinal canal: Significantly limited evaluation due to body habitus. Pathology cannot be excluded. Disc levels: Pathology cannot be excluded due to significant limitations as described above. Upper chest: Negative. Other: None. IMPRESSION: No acute intracranial abnormality seen. Due to significant limitations of the images of the cervical spine secondary to attenuation artifact from body habitus, fracture  or other pathology involving the cervical spine cannot be excluded on the basis of this exam. Electronically Signed   By: Marijo Conception M.D.   On: 03/14/2022 13:55   CT Head Wo Contrast  Result Date: 03/14/2022 CLINICAL DATA:  Neck pain after fall. EXAM: CT HEAD WITHOUT CONTRAST CT CERVICAL SPINE WITHOUT CONTRAST TECHNIQUE: Multidetector CT imaging of the head and cervical spine was performed following the standard protocol without intravenous contrast. Multiplanar CT image reconstructions of the cervical spine were also generated. However, visualization of the cervical spine is severely limited due to artifact from body habitus. RADIATION DOSE REDUCTION: This exam was performed according to the departmental dose-optimization program which includes automated exposure  control, adjustment of the mA and/or kV according to patient size and/or use of iterative reconstruction technique. COMPARISON:  July 15, 2021. FINDINGS: CT HEAD FINDINGS Brain: No evidence of acute infarction, hemorrhage, hydrocephalus, extra-axial collection or mass lesion/mass effect. Vascular: No hyperdense vessel or unexpected calcification. Skull: Normal. Negative for fracture or focal lesion. Sinuses/Orbits: No acute finding. Other: None. CT CERVICAL SPINE FINDINGS Alignment: Grossly normal, although limited as described above. Skull base and vertebrae: Fracture cannot be excluded on the basis of this exam due to limited evaluation of the vertebral bodies due to artifact from body habitus. Soft tissues and spinal canal: Significantly limited evaluation due to body habitus. Pathology cannot be excluded. Disc levels: Pathology cannot be excluded due to significant limitations as described above. Upper chest: Negative. Other: None. IMPRESSION: No acute intracranial abnormality seen. Due to significant limitations of the images of the cervical spine secondary to attenuation artifact from body habitus, fracture or other pathology involving the cervical spine cannot be excluded on the basis of this exam. Electronically Signed   By: Marijo Conception M.D.   On: 03/14/2022 13:55   DG Chest Port 1 View  Result Date: 03/14/2022 CLINICAL DATA:  Shortness of breath. EXAM: PORTABLE CHEST 1 VIEW COMPARISON:  Chest radiograph dated 07/15/2021. FINDINGS: No focal consolidation, pleural effusion, or pneumothorax. Top-normal cardiac silhouette. No acute osseous pathology. IMPRESSION: No active disease. Electronically Signed   By: Anner Crete M.D.   On: 03/14/2022 12:00    Procedures Procedures   Medications Ordered in ED Medications  ketorolac (TORADOL) 15 MG/ML injection 15 mg (15 mg Intravenous Given 03/14/22 1201)  HYDROmorphone (DILAUDID) injection 0.5 mg (0.5 mg Intravenous Given 03/14/22 1510)     ED Course/ Medical Decision Making/ A&P                           Medical Decision Making Sarah Cervantes is a 41 year old female, history of alcohol abuse, here for a fall that occurred earlier today.  Unknown loss of consciousness.  Not on any thinners.  Felt dizzy which she states she often does, and fell.  Head CT, spine CTs obtained secondary to tenderness to palpation, no edema consciousness.  Electrolyte, and heart work-up as well.  Amount and/or Complexity of Data Reviewed Labs: ordered.    Details: Labs unremarkable.  Urine in process.  Troponins within normal limits.  No evidence of leukocytosis per CBC. Radiology: ordered.    Details: Reviewed CTs, age-indeterminate compression fracture at T10, further CTs difficult to evaluate given patient's body habitus.  Also notes a Jerret Mcbane pleural effusion. Discussion of management or test interpretation with external provider(s): Discussed with Dr. Regenia Skeeter, patient unable to ambulate, previously was able to ambulate.  Now requiring oxygen.  Has Anureet Bruington pleural effusion,  compression fracture, which is age-indeterminate.  Will admit to hospitalist secondary to weakness, inability and ambulate, and new oxygen requirement.  She will need further evaluation.  Oxygen requirement possibly secondary to pleural effusion versus obesity induced.  She was nontachycardic and that while in the ED.  Difficult to determine whether she required oxygen at home and has just not been wearing it, she denies this, however previously told nursing otherwise.  Risk Prescription drug management. Decision regarding hospitalization.    Final Clinical Impression(s) / ED Diagnoses Final diagnoses:  Fall, initial encounter  Hypoxia  Traumatic injury of head, initial encounter  Compression fracture of T10 vertebra, initial encounter [facility])    Rx / DC Orders ED Discharge Orders     None         Tanis Hensarling, Si Gaul, PA 03/14/22 1525    Sherwood Gambler, MD 03/15/22  [phone]

## 2022-03-14 NOTE — ED Notes (Signed)
Pt O2 sat at 84% w/o oxygen.  Pt states she recently had covid and was in hospital.  She states she uses home oxygen at 2lpm

## 2022-03-14 NOTE — ED Notes (Signed)
Pt calls out repeatedly for food , drinks and pain medication.

## 2022-03-14 NOTE — H&P (Signed)
History and Physical  Patient: Sarah Cervantes WFU:[id number] DOB: 1981/01/03 DOA: 03/14/2022 DOS: the patient was seen and examined on 03/14/2022 Patient coming from: Home  Chief Complaint:  Chief Complaint  Patient presents with   Back Pain   Fall   HPI: Sarah Cervantes is a 41 y.o. female with PMH significant of multiple substance abuse, obesity, sleep apnea, anxiety, type 2 diabetes mellitus, mood disorder. Patient presented to the hospital with complaints of a fall at the motel that she is living in. Patient currently at [facility] and.  While coming out of the shower slipped and fell.  Denies any passing out event or head or neck injury. But does report severe pain involving her entire back. No nausea no vomiting. As she was unable to get up on her own due to pain she called 911. At the time of arrival for the 911 patient had another reported fall due to near syncopal event. Patient denies having any similar falls in the past. No nausea no vomiting. At the time of my evaluation patient denies any complaints of change in the medications, no chest pain.  No headache no dizziness.  No focal deficit.  No diarrhea or tingling numbness in the legs. Patient does report pain through of her entire spine with most amount of pain in her mid back area. Patient denies any alcohol abuse but drank 2 beers the night before. Patient reports quit smoking few months ago.  Prior to this she was smoking 1 pack a day. Patient has abused cocaine the night before although denies any other substance abuse. She reports that she has taken Adderall a few days ago. She reports that she is in the process of getting a CPAP through her PCP. She reports he has been on Lasix in the past but does not take any medication like that right now.  Review of Systems: As mentioned in the history of present illness. All other systems reviewed and are negative. Past Medical History:  Diagnosis Date   Cocaine abuse, daily  use ([place]) 03/23/2018   ETOH abuse 03/23/2018   Heroin abuse ([place]) 03/23/2018   History reviewed. No pertinent surgical history. Social History:  reports that she quit smoking about 2 months ago. Her smoking use included cigarettes. She has a 15.00 pack-year smoking history. She has never used smokeless tobacco. She reports current alcohol use of about 12.0 - 15.0 standard drinks of alcohol per week. She reports that she does not currently use drugs after having used the following drugs: Cocaine. Allergies  Allergen Reactions   Latex Swelling, Rash and Other (See Comments)    Hands swell   No family history on file. Prior to Admission medications   Medication Sig Start Date End Date Taking? Authorizing Provider  albuterol (VENTOLIN HFA) 108 (90 Base) MCG/ACT inhaler Inhale 2 puffs into the lungs every 6 (six) hours as needed for wheezing or shortness of breath.   Yes [provider]  ARIPiprazole (ABILIFY) 5 MG tablet Take 1 tablet (5 mg total) by mouth daily. 04/23/18  Yes Derrill Center, NP  FLUoxetine (PROZAC) 40 MG capsule Take 40 mg by mouth in the morning.   Yes [provider]  ibuprofen (ADVIL) 200 MG tablet Take 600 mg by mouth every 6 (six) hours as needed for headache or mild pain.   Yes [provider]  lisinopril (ZESTRIL) 10 MG tablet Take 10 mg by mouth in the morning.   Yes [provider]  OZEMPIC, 0.25 OR 0.5  MG/DOSE, 2 MG/1.5ML SOPN Inject 0.5 mg into the skin every Monday.   Yes [provider]  DULoxetine (CYMBALTA) 30 MG capsule Take 1 capsule (30 mg total) by mouth daily. Patient not taking: Reported on 03/14/2022 04/23/18   Derrill Center, NP  enalapril (VASOTEC) 5 MG tablet Take 1 tablet (5 mg total) by mouth daily. Patient not taking: Reported on 03/14/2022 04/23/18   Derrill Center, NP  gabapentin (NEURONTIN) 400 MG capsule Take 1 capsule (400 mg total) by mouth 3 (three) times daily. Patient not taking: Reported on  03/14/2022 04/22/18   Derrill Center, NP  hydrocortisone (ANUSOL-HC) 2.5 % rectal cream Place rectally 3 (three) times daily. Patient not taking: Reported on 07/15/2021 03/26/18   Charlynne Cousins, MD  hydrOXYzine (ATARAX/VISTARIL) 50 MG tablet Take 1 tablet (50 mg total) by mouth 3 (three) times daily as needed for anxiety. Patient not taking: Reported on 03/14/2022 04/22/18   Derrill Center, NP  nicotine polacrilex (NICORETTE) 2 MG gum Take 1 each (2 mg total) by mouth as needed for smoking cessation. Patient not taking: Reported on 03/14/2022 04/22/18   Derrill Center, NP  prazosin (MINIPRESS) 1 MG capsule Take 1 capsule (1 mg total) by mouth at bedtime. Patient not taking: Reported on 03/14/2022 04/03/18   Pennelope Bracken, MD  topiramate (TOPAMAX) 50 MG tablet Take 1 tablet (50 mg total) by mouth at bedtime. Patient not taking: Reported on 03/14/2022 04/03/18   Pennelope Bracken, MD  traZODone (DESYREL) 100 MG tablet Take 1 tablet (100 mg total) by mouth at bedtime and may repeat dose one time if needed. Patient not taking: Reported on 03/14/2022 04/22/18   Derrill Center, NP   Physical Exam: Vitals:   03/14/22 1500 03/14/22 1600 03/14/22 1730 03/14/22 1830  BP: (!) 115/90 136/83 138/88 132/78  Pulse: 86 83 88 86  Resp: '18 18 18 18  '$ Temp:    98.1 F (36.7 C)  TempSrc:    Oral  SpO2: 93% 94% 95% 94%  Weight:      Height:       General: Appear in moderate distress; no visible Abnormal Neck Mass Or lumps, Conjunctiva normal Cardiovascular: S1 and S2 Present, no Murmur, Respiratory: good respiratory effort, Bilateral Air entry present and CTA, difficult to assess, no crackles, no wheezes Abdomen: Bowel Sound present, Non tender  Extremities: bilateral  Pedal edema Neurology: alert and oriented to time, place, and person Gait not checked due to patient safety concerns   Data Reviewed: I have Reviewed nursing notes, Vitals, and Lab results since pt's last  encounter. Pertinent lab results CBC and BMP I have ordered test including CBC and BMP   I have ordered an echocardiogram. Assessment and Plan Near syncope Likely multifactorial. Substance abuse possible CHF exacerbation. For now monitor on telemetry. PT OT consult. Orthostatic vitals will be difficult secondary to patient's ongoing pain.  Acute on chronic diastolic CHF. Acute hypoxic respiratory failure. Patient is currently requiring 2 L of oxygen.  Saturation drops down to 84% on room air. Chest x-ray not remarkable. Examination difficult due to her body habitus. Patient does have swelling of her legs. We will treat with IV Lasix and monitor response for Check echocardiogram.  Cocaine abuse with cocaine-induced mood disorder (HCC) PTSD (post-traumatic stress disorder) Bipolar I disorder, current or most recent episode depressed, with psychotic features ([place]) Patient's UDS is positive for cocaine, amphetamine, cannabis and barbiturates. Patient states that she is only abusing cocaine  and has used Adderall a few days ago. Social worker consult. Lorazepam as needed.  Constipation Will initiate bowel regimen.  Closed fracture of T10 vertebra (HCC) Age indeterminant. Possible injury this admission.  May actually have sustained injury prior to this admission as well. For now etiology is pain control. Minimize narcotics due tohigh risk for sleep apnea.    Advance Care Planning:   Code Status: Full Code  Consults: None Family Communication: No one at bedside.  Author: Berle Mull, MD 03/14/2022 7:31 PM For on call review [email or web address].

## 2022-03-14 NOTE — ED Triage Notes (Addendum)
BIB EMS at [facility], coming out of shower slipped and fell back and neck pain. Pt was ambulatory down a flight of steps to get to EMS truck.152/[id number]% RA

## 2022-03-15 ENCOUNTER — Observation Stay (HOSPITAL_BASED_OUTPATIENT_CLINIC_OR_DEPARTMENT_OTHER): Payer: 59

## 2022-03-15 DIAGNOSIS — F4310 Post-traumatic stress disorder, unspecified: Secondary | ICD-10-CM

## 2022-03-15 DIAGNOSIS — S22079A Unspecified fracture of T9-T10 vertebra, initial encounter for closed fracture: Secondary | ICD-10-CM

## 2022-03-15 DIAGNOSIS — R55 Syncope and collapse: Secondary | ICD-10-CM | POA: Diagnosis not present

## 2022-03-15 DIAGNOSIS — F1994 Other psychoactive substance use, unspecified with psychoactive substance-induced mood disorder: Secondary | ICD-10-CM

## 2022-03-15 DIAGNOSIS — I5033 Acute on chronic diastolic (congestive) heart failure: Secondary | ICD-10-CM

## 2022-03-15 DIAGNOSIS — J9601 Acute respiratory failure with hypoxia: Secondary | ICD-10-CM

## 2022-03-15 DIAGNOSIS — F315 Bipolar disorder, current episode depressed, severe, with psychotic features: Secondary | ICD-10-CM | POA: Diagnosis not present

## 2022-03-15 DIAGNOSIS — F1414 Cocaine abuse with cocaine-induced mood disorder: Secondary | ICD-10-CM | POA: Diagnosis not present

## 2022-03-15 DIAGNOSIS — K5900 Constipation, unspecified: Secondary | ICD-10-CM

## 2022-03-15 LAB — ECHOCARDIOGRAM COMPLETE
AR max vel: 3.02 cm2
AV Area VTI: 2.68 cm2
AV Area mean vel: 2.94 cm2
AV Mean grad: 7 mmHg
AV Peak grad: 14 mmHg
Ao pk vel: 1.87 m/s
Area-P 1/2: 3.48 cm2
Calc EF: 38.7 %
Height: 64 in
S' Lateral: 2.5 cm
Single Plane A2C EF: 44 %
Single Plane A4C EF: 38.8 %
Weight: 5684.8 oz

## 2022-03-15 LAB — COMPREHENSIVE METABOLIC PANEL
ALT: 32 U/L (ref 0–44)
AST: 18 U/L (ref 15–41)
Albumin: 2.9 g/dL — ABNORMAL LOW (ref 3.5–5.0)
Alkaline Phosphatase: 48 U/L (ref 38–126)
Anion gap: 6 (ref 5–15)
BUN: 13 mg/dL (ref 6–20)
CO2: 31 mmol/L (ref 22–32)
Calcium: 8.2 mg/dL — ABNORMAL LOW (ref 8.9–10.3)
Chloride: 103 mmol/L (ref 98–111)
Creatinine, Ser: 0.76 mg/dL (ref 0.44–1.00)
GFR, Estimated: >60 mL/min (ref >60)
Glucose, Bld: 113 mg/dL — ABNORMAL HIGH (ref 70–99)
Potassium: 3.5 mmol/L (ref 3.5–5.1)
Sodium: 140 mmol/L (ref 135–145)
Total Bilirubin: 0.3 mg/dL (ref 0.3–1.2)
Total Protein: 6 g/dL — ABNORMAL LOW (ref 6.5–8.1)

## 2022-03-15 LAB — GLUCOSE, CAPILLARY
Glucose-Capillary: 139 mg/dL — ABNORMAL HIGH (ref 70–99)
Glucose-Capillary: 128 mg/dL — ABNORMAL HIGH (ref 70–99)
Glucose-Capillary: 99 mg/dL (ref 70–99)
Glucose-Capillary: 103 mg/dL — ABNORMAL HIGH (ref 70–99)
Glucose-Capillary: 122 mg/dL — ABNORMAL HIGH (ref 70–99)

## 2022-03-15 LAB — HEMOGLOBIN A1C
Hgb A1c MFr Bld: 5.9 % — ABNORMAL HIGH (ref 4.8–5.6)
Mean Plasma Glucose: 122.63 mg/dL

## 2022-03-15 LAB — CBC
HCT: 40.7 % (ref 36.0–46.0)
Hemoglobin: 12.9 g/dL (ref 12.0–15.0)
MCH: 31.8 pg (ref 26.0–34.0)
MCHC: 31.7 g/dL (ref 30.0–36.0)
MCV: 100.2 fL — ABNORMAL HIGH (ref 80.0–100.0)
Platelets: 288 10*3/uL (ref 150–400)
RBC: 4.06 MIL/uL (ref 3.87–5.11)
RDW: 14.3 % (ref 11.5–15.5)
WBC: 6 10*3/uL (ref 4.0–10.5)
nRBC: 0 % (ref 0.0–0.2)

## 2022-03-15 MED ORDER — PERFLUTREN LIPID MICROSPHERE
1.0000 mL | INTRAVENOUS | Status: AC | PRN
  Administered 2022-03-15: 2 mL via INTRAVENOUS

## 2022-03-15 NOTE — Progress Notes (Signed)
PROGRESS NOTE    Sarah Cervantes  YWV:[id number] DOB: 05/11/1981 DOA: 03/14/2022 PCP: Patient, No Pcp Per   Brief Narrative:  The patient is a 41 year old super morbidly obese Caucasian female with a past medical history significant for but not limited to multiple substance abuse, morbid obesity, sleep apnea, anxiety and mood disorder, diabetes mellitus type 2 as well as other comorbidities who presented to the hospital complains of a fall at her motel that she was living.  She states that she is coming out of the shower and slipped and fell at midnight a passing out or head or neck injury.  She did report severe back pain involving her entire back and had no nausea or vomiting.  Given that she was unable to get up on her own due to the pain she called 911 and at the time of arrival by EMS she had reported another fall due to a near syncopal event and denies having any similar falls in the past.  She denies any nausea or vomiting.  She denies any changes in medications and had no chest pain.  She does drink alcohol and drinks 2 beers the night before and reports formerly smoking 1 pack/day but quit a few months ago.  She continues to abuse cocaine and this was the night before prior to admission and denied any other substance abuse and does reportedly take Adderall 3 days ago.  She is in the process of getting a CPAP through her PCP and reports that she has been Lasix in the past but does not take that medication currently.  Currently she is being admitted and treated for the following:  Assessment and Plan: No notes have been filed under this hospital service. Service: Hospitalist  Near syncope -Likely multifactorial. -Substance abuse possible CHF exacerbation. -For now monitor on telemetry. -PT OT consult recommending no follow-up -Orthostatic vitals will be difficult secondary to patient's ongoing pain and because she is on Lasix now -Check in the a.m. and ambulate in the morning   ?Acute  on chronic diastolic CHF. Acute hypoxic respiratory failure. -Patient is currently requiring 2 L of oxygen.  Saturation drops down to 84% on room air and desaturated on Ambulatory Screen  -SpO2: 96 % O2 Flow Rate (L/min): 2 L/min -Chest x-ray not remarkable. -Examination difficult due to her body habitus. -Patient does have swelling of her legs. -We will continue with IV Lasix and monitor response for -Check echocardiogram showed a left ventricular ejection fraction by estimation to be 60 to 65% with normal LV function and no regional wall motion abnormalities and there was mild concentric left ventricular hypertrophy with left ventricular diastolic parameters being normal and the right ventricular systolic function was normal -Strict I's and O's and daily weights -Accurate weights have not been done and accurate ins and outs have not been recorded either -Continue to monitor volume status carefully and repeat chest x-ray in the a.m. and will need an Ambulatory O2 screen again in the morning   Cocaine abuse with cocaine-induced mood disorder (HCC) PTSD (post-traumatic stress disorder) Bipolar I disorder, current or most recent episode depressed, with psychotic features ([place]) -Patient's UDS is positive for cocaine, amphetamine, cannabis and barbiturates. -Patient states that she is only abusing cocaine and has used Adderall a few days ago. -Education officer, museum consult. -Continue Lorazepam as needed.   Hypoalbuminemia -Patient's albumin level now 2.9 -Continue to monitor trend and repeat CMP in a.m.  Constipation -Will initiate bowel regimen and she is now on docusate sodium  100 mg p.o. twice daily and senna 8.6 mg p.o. twice daily -Patient also has a bisacodyl 10 mg rectal suppository daily as needed for moderate constipation   Closed fracture of T10 vertebra (HCC) -Age indeterminant. -Possible injury this admission.  May actually have sustained injury prior to this admission as well. -CT  Thoracic Spine done and showed "Age-indeterminate T10 compression deformity with 20% height loss. No bony retropulsion. Motion artifact through the T10-T11 level somewhat limits evaluation. No acute lumbar spine fracture. Small right pleural effusion." -For now continue pain control with oxycodone 5 mg every 6 as needed for severe pain, methocarbamol 500 g p.o. 3 times daily, lidocaine patch 2 patches transdermally every 12 hours, and ketorolac 30 mg IV every 6 -Minimize narcotics due tohigh risk for sleep apnea.     Prediabetes -Patient's hemoglobin A1c was 5.9 -CBGs ranging from 99-139 -Continue monitor blood sugars per protocol and she is on moderate NovoLog sliding scale insulin AC  Super Morbid Obesity -Complicates overall prognosis and care -Estimated body mass index is 61.61 kg/m as calculated from the following:   Height as of this encounter: '5\' 4"'$  (1.626 m).   Weight as of this encounter: 162.8 kg.  -Weight Loss and Dietary Counseling given  DVT prophylaxis:   Enoxaparin 60 mg every 24 hours    Code Status: Full Code Family Communication: No family currently at bedside  Disposition Plan:  Level of care: Telemetry Status is: Observation The patient remains OBS appropriate and will d/c before 2 midnights.   Consultants:  None  Procedures:  As delineated as above  ECHOCARDIOGRAM IMPRESSIONS     1. Left ventricular ejection fraction, by estimation, is 60 to 65%. The  left ventricle has normal function. The left ventricle has no regional  wall motion abnormalities. There is mild concentric left ventricular  hypertrophy. Left ventricular diastolic  parameters were normal.   2. Right ventricular systolic function is normal. The right ventricular  size is normal.   3. The mitral valve is normal in structure. No evidence of mitral valve  regurgitation. No evidence of mitral stenosis.   4. The aortic valve is tricuspid. Aortic valve regurgitation is not  visualized.  Aortic valve sclerosis/calcification is present, without any  evidence of aortic stenosis.   5. The inferior vena cava is normal in size with greater than 50%  respiratory variability, suggesting right atrial pressure of 3 mmHg.   6. Technically difficult study due to poor sound wave transmission.   FINDINGS   Left Ventricle: Left ventricular ejection fraction, by estimation, is 60  to 65%. The left ventricle has normal function. The left ventricle has no  regional wall motion abnormalities. The left ventricular internal cavity  size was normal in size. There is   mild concentric left ventricular hypertrophy. Left ventricular diastolic  parameters were normal.   Right Ventricle: The right ventricular size is normal. No increase in  right ventricular wall thickness. Right ventricular systolic function is  normal.   Left Atrium: Left atrial size was normal in size.   Right Atrium: Right atrial size was normal in size.   Pericardium: There is no evidence of pericardial effusion.   Mitral Valve: The mitral valve is normal in structure. No evidence of  mitral valve regurgitation. No evidence of mitral valve stenosis.   Tricuspid Valve: The tricuspid valve is normal in structure. Tricuspid  valve regurgitation is not demonstrated. No evidence of tricuspid  stenosis.   Aortic Valve: The aortic valve is tricuspid.  Aortic valve regurgitation is  not visualized. Aortic valve sclerosis/calcification is present, without  any evidence of aortic stenosis. Aortic valve mean gradient measures 7.0  mmHg. Aortic valve peak gradient  measures 14.0 mmHg. Aortic valve area, by VTI measures 2.68 cm.   Pulmonic Valve: The pulmonic valve was normal in structure. Pulmonic valve  regurgitation is not visualized. No evidence of pulmonic stenosis.   Aorta: The aortic root is normal in size and structure.   Venous: The inferior vena cava is normal in size with greater than 50%  respiratory  variability, suggesting right atrial pressure of 3 mmHg.   IAS/Shunts: No atrial level shunt detected by color flow Doppler.      LEFT VENTRICLE  PLAX 2D  LVIDd:         4.00 cm     Diastology  LVIDs:         2.50 cm     LV e' medial:    7.83 cm/s  LV PW:         1.20 cm     LV E/e' medial:  14.6  LV IVS:        1.20 cm     LV e' lateral:   7.29 cm/s  LVOT diam:     2.30 cm     LV E/e' lateral: 15.6  LV SV:         81  LV SV Index:   33  LVOT Area:     4.15 cm     LV Volumes (MOD)  LV vol d, MOD A2C: 57.3 ml  LV vol d, MOD A4C: 76.3 ml  LV vol s, MOD A2C: 32.1 ml  LV vol s, MOD A4C: 46.7 ml  LV SV MOD A2C:     25.2 ml  LV SV MOD A4C:     76.3 ml  LV SV MOD BP:      25.5 ml   RIGHT VENTRICLE  RV S prime:     15.70 cm/s  TAPSE (M-mode): 1.9 cm   LEFT ATRIUM           Index  LA diam:      3.60 cm 1.44 cm/m  LA Vol (A4C): 58.3 ml 23.34 ml/m   AORTIC VALVE                     PULMONIC VALVE  AV Area (Vmax):    3.02 cm      PV Vmax:       0.98 m/s  AV Area (Vmean):   2.94 cm      PV Peak grad:  3.8 mmHg  AV Area (VTI):     2.68 cm  AV Vmax:           187.00 cm/s  AV Vmean:          122.000 cm/s  AV VTI:            0.304 m  AV Peak Grad:      14.0 mmHg  AV Mean Grad:      7.0 mmHg  LVOT Vmax:         136.00 cm/s  LVOT Vmean:        86.400 cm/s  LVOT VTI:          0.196 m  LVOT/AV VTI ratio: 0.64     AORTA  Ao Root diam: 3.30 cm   MITRAL VALVE  TRICUSPID VALVE  MV Area (PHT): 3.48 cm     TV Peak grad:   16.0 mmHg  MV Decel Time: 218 msec     TV Vmax:        2.00 m/s  MV E velocity: 114.00 cm/s  MV A velocity: 105.00 cm/s  SHUNTS  MV E/A ratio:  1.09         Systemic VTI:  0.20 m                              Systemic Diam: 2.30 cm   Antimicrobials:  Anti-infectives (From admission, onward)    None       Subjective: Seen and examined at bedside when she was a little withdrawn complaining of back pain.  Resting in the bed.  No chest pain or  shortness breath.  Felt okay but was complaining of significant back pain.  No other concerns or complaints at this time.  Objective: Vitals:   03/15/22 0837 03/15/22 1144 03/15/22 1647 03/15/22 1650  BP: (!) 144/87 (!) 133/92 120/67   Pulse: 74 68 81   Resp: 18 16 (!) 22   Temp: 98.2 F (36.8 C) 97.6 F (36.4 C) 97.8 F (36.6 C)   TempSrc: Oral Oral Oral   SpO2: 94% 92% 96%   Weight:    (!) 162.8 kg  Height:        Intake/Output Summary (Last 24 hours) at 03/15/2022 1656 Last data filed at 03/15/2022 0900 Gross per 24 hour  Intake 360 ml  Output --  Net 360 ml   Filed Weights   03/14/22 1019 03/15/22 0114 03/15/22 1650  Weight: 136.1 kg (!) 161.2 kg (!) 162.8 kg   Examination: Physical Exam:  Constitutional: WN/WD super morbidly obese Caucasian female, in no acute distress appears little withdrawn and somnolent Respiratory: Diminished to auscultation bilaterally with coarse breath sounds, no wheezing, rales, rhonchi or crackles. Normal respiratory effort and patient is not tachypenic. No accessory muscle use.  Wearing supplemental oxygen nasal cannula Cardiovascular: RRR, no murmurs / rubs / gallops. S1 and S2 auscultated.  She has 1+ lower extremity edema Abdomen: Soft, non-tender, distended secondary body habitus.  Bowel sounds positive.  GU: Deferred. Musculoskeletal: No clubbing / cyanosis of digits/nails. No joint deformity upper and lower extremities.  Skin: No rashes, lesions, ulcers on limited skin evaluation. No induration; Warm and dry.  Neurologic: CN 2-12 grossly intact with no focal deficits. Romberg sign and cerebellar reflexes not assessed.  Psychiatric: Normal judgment and insight. Alert and oriented x 3. Normal mood and appropriate affect.   Data Reviewed: I have personally reviewed following labs and imaging studies  CBC: Recent Labs  Lab 03/14/22 1201 03/15/22 0425  WBC 6.5 6.0  NEUTROABS 3.7  --   HGB 12.8 12.9  HCT 40.7 40.7  MCV 100.2*  100.2*  PLT 280 643   Basic Metabolic Panel: Recent Labs  Lab 03/14/22 1201 03/15/22 0425  NA 141 140  K 3.8 3.5  CL 106 103  CO2 29 31  GLUCOSE 99 113*  BUN 15 13  CREATININE 0.74 0.76  CALCIUM 8.1* 8.2*   GFR: Estimated Creatinine Clearance: 140.1 mL/min (by C-G formula based on SCr of 0.76 mg/dL). Liver Function Tests: Recent Labs  Lab 03/14/22 1201 03/15/22 0425  AST 18 18  ALT 36 32  ALKPHOS 46 48  BILITOT 0.5 0.3  PROT 6.4* 6.0*  ALBUMIN 3.3* 2.9*  No results for input(s): "LIPASE", "AMYLASE" in the last 168 hours. No results for input(s): "AMMONIA" in the last 168 hours. Coagulation Profile: No results for input(s): "INR", "PROTIME" in the last 168 hours. Cardiac Enzymes: No results for input(s): "CKTOTAL", "CKMB", "CKMBINDEX", "TROPONINI" in the last 168 hours. BNP (last 3 results) No results for input(s): "PROBNP" in the last 8760 hours. HbA1C: Recent Labs    03/15/22 0425  HGBA1C 5.9*   CBG: Recent Labs  Lab 03/14/22 2241 03/15/22 0127 03/15/22 0733 03/15/22 1141 03/15/22 1643  GLUCAP 111* 139* 128* 99 103*   Lipid Profile: No results for input(s): "CHOL", "HDL", "LDLCALC", "TRIG", "CHOLHDL", "LDLDIRECT" in the last 72 hours. Thyroid Function Tests: No results for input(s): "TSH", "T4TOTAL", "FREET4", "T3FREE", "THYROIDAB" in the last 72 hours. Anemia Panel: No results for input(s): "VITAMINB12", "FOLATE", "FERRITIN", "TIBC", "IRON", "RETICCTPCT" in the last 72 hours. Sepsis Labs: No results for input(s): "PROCALCITON", "LATICACIDVEN" in the last 168 hours.  No results found for this or any previous visit (from the past 240 hour(s)).   Radiology Studies: ECHOCARDIOGRAM COMPLETE  Result Date: 03/15/2022    ECHOCARDIOGRAM REPORT   Patient Name:   HEYDI SWANGO Date of Exam: 03/15/2022 Medical Rec #:  [id number]         Height:       64.0 in Accession #:    [id number]        Weight:       355.3 lb Date of Birth:  May 19, 1981         BSA:           2.498 m Patient Age:    16 years          BP:           115/71 mmHg Patient Gender: F                 HR:           76 bpm. Exam Location:  Inpatient Procedure: 2D Echo and Intracardiac Opacification Agent Indications:    CHF  History:        Patient has prior history of Echocardiogram examinations, most                 recent 03/24/2018.  Sonographer:    Harvie Junior Referring Phys: [id number] [facility] [place] M PATEL  Sonographer Comments: Technically difficult study due to poor echo windows, no subcostal window and patient is obese. Image acquisition challenging due to patient body habitus. IMPRESSIONS  1. Left ventricular ejection fraction, by estimation, is 60 to 65%. The left ventricle has normal function. The left ventricle has no regional wall motion abnormalities. There is mild concentric left ventricular hypertrophy. Left ventricular diastolic parameters were normal.  2. Right ventricular systolic function is normal. The right ventricular size is normal.  3. The mitral valve is normal in structure. No evidence of mitral valve regurgitation. No evidence of mitral stenosis.  4. The aortic valve is tricuspid. Aortic valve regurgitation is not visualized. Aortic valve sclerosis/calcification is present, without any evidence of aortic stenosis.  5. The inferior vena cava is normal in size with greater than 50% respiratory variability, suggesting right atrial pressure of 3 mmHg.  6. Technically difficult study due to poor sound wave transmission. FINDINGS  Left Ventricle: Left ventricular ejection fraction, by estimation, is 60 to 65%. The left ventricle has normal function. The left ventricle has no regional wall motion abnormalities. The left ventricular internal cavity size was normal in size. There  is  mild concentric left ventricular hypertrophy. Left ventricular diastolic parameters were normal. Right Ventricle: The right ventricular size is normal. No increase in right ventricular wall thickness. Right  ventricular systolic function is normal. Left Atrium: Left atrial size was normal in size. Right Atrium: Right atrial size was normal in size. Pericardium: There is no evidence of pericardial effusion. Mitral Valve: The mitral valve is normal in structure. No evidence of mitral valve regurgitation. No evidence of mitral valve stenosis. Tricuspid Valve: The tricuspid valve is normal in structure. Tricuspid valve regurgitation is not demonstrated. No evidence of tricuspid stenosis. Aortic Valve: The aortic valve is tricuspid. Aortic valve regurgitation is not visualized. Aortic valve sclerosis/calcification is present, without any evidence of aortic stenosis. Aortic valve mean gradient measures 7.0 mmHg. Aortic valve peak gradient measures 14.0 mmHg. Aortic valve area, by VTI measures 2.68 cm. Pulmonic Valve: The pulmonic valve was normal in structure. Pulmonic valve regurgitation is not visualized. No evidence of pulmonic stenosis. Aorta: The aortic root is normal in size and structure. Venous: The inferior vena cava is normal in size with greater than 50% respiratory variability, suggesting right atrial pressure of 3 mmHg. IAS/Shunts: No atrial level shunt detected by color flow Doppler.  LEFT VENTRICLE PLAX 2D LVIDd:         4.00 cm     Diastology LVIDs:         2.50 cm     LV e' medial:    7.83 cm/s LV PW:         1.20 cm     LV E/e' medial:  14.6 LV IVS:        1.20 cm     LV e' lateral:   7.29 cm/s LVOT diam:     2.30 cm     LV E/e' lateral: 15.6 LV SV:         81 LV SV Index:   33 LVOT Area:     4.15 cm  LV Volumes (MOD) LV vol d, MOD A2C: 57.3 ml LV vol d, MOD A4C: 76.3 ml LV vol s, MOD A2C: 32.1 ml LV vol s, MOD A4C: 46.7 ml LV SV MOD A2C:     25.2 ml LV SV MOD A4C:     76.3 ml LV SV MOD BP:      25.5 ml RIGHT VENTRICLE RV S prime:     15.70 cm/s TAPSE (M-mode): 1.9 cm LEFT ATRIUM           Index LA diam:      3.60 cm 1.44 cm/m LA Vol (A4C): 58.3 ml 23.34 ml/m  AORTIC VALVE                     PULMONIC  VALVE AV Area (Vmax):    3.02 cm      PV Vmax:       0.98 m/s AV Area (Vmean):   2.94 cm      PV Peak grad:  3.8 mmHg AV Area (VTI):     2.68 cm AV Vmax:           187.00 cm/s AV Vmean:          122.000 cm/s AV VTI:            0.304 m AV Peak Grad:      14.0 mmHg AV Mean Grad:      7.0 mmHg LVOT Vmax:         136.00 cm/s LVOT Vmean:  86.400 cm/s LVOT VTI:          0.196 m LVOT/AV VTI ratio: 0.64  AORTA Ao Root diam: 3.30 cm MITRAL VALVE                TRICUSPID VALVE MV Area (PHT): 3.48 cm     TV Peak grad:   16.0 mmHg MV Decel Time: 218 msec     TV Vmax:        2.00 m/s MV E velocity: 114.00 cm/s MV A velocity: 105.00 cm/s  SHUNTS MV E/A ratio:  1.09         Systemic VTI:  0.20 m                             Systemic Diam: 2.30 cm Glori Bickers MD Electronically signed by Glori Bickers MD Signature Date/Time: 03/15/2022/4:28:06 PM    Final    CT Thoracic Spine Wo Contrast  Result Date: 03/14/2022 CLINICAL DATA:  Back trauma, no prior imaging (Age >= 16y) trauma; fall; trauma; fell on back, severe midline pain EXAM: CT THORACIC AND LUMBAR SPINE WITHOUT CONTRAST TECHNIQUE: Multidetector CT imaging of the thoracic and lumbar spine was performed without contrast. Multiplanar CT image reconstructions were also generated. RADIATION DOSE REDUCTION: This exam was performed according to the departmental dose-optimization program which includes automated exposure control, adjustment of the mA and/or kV according to patient size and/or use of iterative reconstruction technique. COMPARISON:  CT 07/15/2021. FINDINGS: CT THORACIC SPINE FINDINGS Alignment: Normal. Vertebrae: There is an age-indeterminate T10 compression deformity with 20% height loss. No bony retropulsion. Unchanged anterior superior sclerosis at T12. Congenital variant left first rib. Motion artifact through T10 and T11 limits fine bone and soft-tissue detail. Paraspinal and other soft tissues: Small right pleural effusion. Disc levels: There  is moderate multilevel degenerative disc disease with bulky anterolateral osteophytes in the thoracic spine, most prominent at T9-T10 and T10-T11. No visible impingement. CT LUMBAR SPINE FINDINGS Segmentation: 5 lumbar type vertebrae. Alignment: Normal. Vertebrae: No acute fracture or focal pathologic process. Paraspinal and other soft tissues: Negative. Disc levels: No visible impingement. Mild disc bulging at L3-L4 and L4-L5. Mild multilevel facet arthropathy. IMPRESSION: Age-indeterminate T10 compression deformity with 20% height loss. No bony retropulsion. Motion artifact through the T10-T11 level somewhat limits evaluation. No acute lumbar spine fracture. Small right pleural effusion. Electronically Signed   By: Maurine Simmering M.D.   On: 03/14/2022 14:16   CT Lumbar Spine Wo Contrast  Result Date: 03/14/2022 CLINICAL DATA:  Back trauma, no prior imaging (Age >= 16y) trauma; fall; trauma; fell on back, severe midline pain EXAM: CT THORACIC AND LUMBAR SPINE WITHOUT CONTRAST TECHNIQUE: Multidetector CT imaging of the thoracic and lumbar spine was performed without contrast. Multiplanar CT image reconstructions were also generated. RADIATION DOSE REDUCTION: This exam was performed according to the departmental dose-optimization program which includes automated exposure control, adjustment of the mA and/or kV according to patient size and/or use of iterative reconstruction technique. COMPARISON:  CT 07/15/2021. FINDINGS: CT THORACIC SPINE FINDINGS Alignment: Normal. Vertebrae: There is an age-indeterminate T10 compression deformity with 20% height loss. No bony retropulsion. Unchanged anterior superior sclerosis at T12. Congenital variant left first rib. Motion artifact through T10 and T11 limits fine bone and soft-tissue detail. Paraspinal and other soft tissues: Small right pleural effusion. Disc levels: There is moderate multilevel degenerative disc disease with bulky anterolateral osteophytes in the thoracic  spine, most prominent at T9-T10 and  T10-T11. No visible impingement. CT LUMBAR SPINE FINDINGS Segmentation: 5 lumbar type vertebrae. Alignment: Normal. Vertebrae: No acute fracture or focal pathologic process. Paraspinal and other soft tissues: Negative. Disc levels: No visible impingement. Mild disc bulging at L3-L4 and L4-L5. Mild multilevel facet arthropathy. IMPRESSION: Age-indeterminate T10 compression deformity with 20% height loss. No bony retropulsion. Motion artifact through the T10-T11 level somewhat limits evaluation. No acute lumbar spine fracture. Small right pleural effusion. Electronically Signed   By: Maurine Simmering M.D.   On: 03/14/2022 14:16   CT Cervical Spine Wo Contrast  Result Date: 03/14/2022 CLINICAL DATA:  Neck pain after fall. EXAM: CT HEAD WITHOUT CONTRAST CT CERVICAL SPINE WITHOUT CONTRAST TECHNIQUE: Multidetector CT imaging of the head and cervical spine was performed following the standard protocol without intravenous contrast. Multiplanar CT image reconstructions of the cervical spine were also generated. However, visualization of the cervical spine is severely limited due to artifact from body habitus. RADIATION DOSE REDUCTION: This exam was performed according to the departmental dose-optimization program which includes automated exposure control, adjustment of the mA and/or kV according to patient size and/or use of iterative reconstruction technique. COMPARISON:  July 15, 2021. FINDINGS: CT HEAD FINDINGS Brain: No evidence of acute infarction, hemorrhage, hydrocephalus, extra-axial collection or mass lesion/mass effect. Vascular: No hyperdense vessel or unexpected calcification. Skull: Normal. Negative for fracture or focal lesion. Sinuses/Orbits: No acute finding. Other: None. CT CERVICAL SPINE FINDINGS Alignment: Grossly normal, although limited as described above. Skull base and vertebrae: Fracture cannot be excluded on the basis of this exam due to limited evaluation of  the vertebral bodies due to artifact from body habitus. Soft tissues and spinal canal: Significantly limited evaluation due to body habitus. Pathology cannot be excluded. Disc levels: Pathology cannot be excluded due to significant limitations as described above. Upper chest: Negative. Other: None. IMPRESSION: No acute intracranial abnormality seen. Due to significant limitations of the images of the cervical spine secondary to attenuation artifact from body habitus, fracture or other pathology involving the cervical spine cannot be excluded on the basis of this exam. Electronically Signed   By: Marijo Conception M.D.   On: 03/14/2022 13:55   CT Head Wo Contrast  Result Date: 03/14/2022 CLINICAL DATA:  Neck pain after fall. EXAM: CT HEAD WITHOUT CONTRAST CT CERVICAL SPINE WITHOUT CONTRAST TECHNIQUE: Multidetector CT imaging of the head and cervical spine was performed following the standard protocol without intravenous contrast. Multiplanar CT image reconstructions of the cervical spine were also generated. However, visualization of the cervical spine is severely limited due to artifact from body habitus. RADIATION DOSE REDUCTION: This exam was performed according to the departmental dose-optimization program which includes automated exposure control, adjustment of the mA and/or kV according to patient size and/or use of iterative reconstruction technique. COMPARISON:  July 15, 2021. FINDINGS: CT HEAD FINDINGS Brain: No evidence of acute infarction, hemorrhage, hydrocephalus, extra-axial collection or mass lesion/mass effect. Vascular: No hyperdense vessel or unexpected calcification. Skull: Normal. Negative for fracture or focal lesion. Sinuses/Orbits: No acute finding. Other: None. CT CERVICAL SPINE FINDINGS Alignment: Grossly normal, although limited as described above. Skull base and vertebrae: Fracture cannot be excluded on the basis of this exam due to limited evaluation of the vertebral bodies due to  artifact from body habitus. Soft tissues and spinal canal: Significantly limited evaluation due to body habitus. Pathology cannot be excluded. Disc levels: Pathology cannot be excluded due to significant limitations as described above. Upper chest: Negative. Other: None. IMPRESSION: No acute intracranial  abnormality seen. Due to significant limitations of the images of the cervical spine secondary to attenuation artifact from body habitus, fracture or other pathology involving the cervical spine cannot be excluded on the basis of this exam. Electronically Signed   By: Marijo Conception M.D.   On: 03/14/2022 13:55   DG Chest Port 1 View  Result Date: 03/14/2022 CLINICAL DATA:  Shortness of breath. EXAM: PORTABLE CHEST 1 VIEW COMPARISON:  Chest radiograph dated 07/15/2021. FINDINGS: No focal consolidation, pleural effusion, or pneumothorax. Top-normal cardiac silhouette. No acute osseous pathology. IMPRESSION: No active disease. Electronically Signed   By: Anner Crete M.D.   On: 03/14/2022 12:00    Scheduled Meds:  docusate sodium  100 mg Oral BID   enoxaparin (LOVENOX) injection  60 mg Subcutaneous Q24H   furosemide  40 mg Intravenous BID   gabapentin  400 mg Oral TID   insulin aspart  0-15 Units Subcutaneous TID WC   insulin aspart  0-5 Units Subcutaneous QHS   ketorolac  30 mg Intravenous Q6H   lidocaine  2 patch Transdermal Q24H   methocarbamol  500 mg Oral TID   senna  1 tablet Oral BID   Continuous Infusions:   LOS: 0 days   Raiford Noble, DO [facility] Available via Epic secure chat 7am-7pm After these hours, please refer to coverage provider listed on [email or web address] 03/15/2022, 4:56 PM

## 2022-03-15 NOTE — Evaluation (Deleted)
OT Cancellation Note  Patient Details Name: Bennie Scaff MRN: [id number] DOB: 04/26/1981   Cancelled Treatment:    Reason Eval/Treat Not Completed: Other (comment) Patient declined therapy due to being in pain and tired.  Charlann Lange 03/15/2022, 11:28 AM

## 2022-03-15 NOTE — Progress Notes (Signed)
Chaplain met with Sarah Cervantes.  Her main concern at this time is her pain and she did not report any spiritual needs and declined speaking further at this time.  Chaplain let her nurse know that [name] was hurting as [name] did not know when she had last taken any pain medication.

## 2022-03-15 NOTE — Progress Notes (Signed)
  Echocardiogram 2D Echocardiogram has been performed.  Sarah Cervantes 03/15/2022, 2:44 PM

## 2022-03-15 NOTE — Evaluation (Signed)
Occupational Therapy Evaluation Patient Details Name: Sarah Cervantes MRN: [id number] DOB: 20-Jun-1980 Today's Date: 03/15/2022   History of Present Illness Sarah Cervantes is a 41 y.o. female with PMH significant of multiple substance abuse, obesity, sleep apnea, anxiety, type 2 diabetes mellitus, mood disorder.  Patient presented to the hospital with complaints of a fall at the motel that she is living in.  Patient currently at retro and.  While coming out of the shower slipped and fell. Found to have Closed fracture of T10 vertebra  Age indeterminant. Patient admitted for possible CHF exacerbation and pain control.   Clinical Impression   Sarah Cervantes is a 41 year old woman who presents with morbid obesity, decreased activity tolerance, pain, and decreased o2 saturations with ambulation. Overall patient near her baseline but needing increased assistance for LB Adls due to pain. She has difficulty with this at baseline but may benefit from AE. At rest and with minimal ambulation patient able to maintain oxygen at 91-2% on RA. With extended ambulation in hallway patient dropped to 86%. On 2 L [place] she dropped to 89% with continued ambulation. Patient may need oxygen at home but will continue to assess. Patient will benefit from skilled OT services while in hospital to improve deficits and learn compensatory strategies as needed in order to be at modified independent.      Recommendations for follow up therapy are one component of a multi-disciplinary discharge planning process, led by the attending physician.  Recommendations may be updated based on patient status, additional functional criteria and insurance authorization.   Follow Up Recommendations  No OT follow up    Assistance Recommended at Discharge PRN  Patient can return home with the following Assistance with cooking/housework;Help with stairs or ramp for entrance;Assist for transportation    Functional Status Assessment   Patient has had a recent decline in their functional status and demonstrates the ability to make significant improvements in function in a reasonable and predictable amount of time.  Equipment Recommendations  None recommended by OT    Recommendations for Other Services       Precautions / Restrictions Precautions Precautions: Fall;Other (comment) Precaution Comments: monitor sats Restrictions Weight Bearing Restrictions: No      Mobility Bed Mobility Overal bed mobility: Modified Independent                  Transfers Overall transfer level: Needs assistance                 General transfer comment: Min guard to ambulate      Balance Overall balance assessment: Mild deficits observed, not formally tested                                         ADL either performed or assessed with clinical judgement   ADL Overall ADL's : Needs assistance/impaired Eating/Feeding: Independent   Grooming: Independent;Standing Grooming Details (indicate cue type and reason): at sink Upper Body Bathing: Set up   Lower Body Bathing: Moderate assistance;Sit to/from stand   Upper Body Dressing : Set up   Lower Body Dressing: Moderate assistance;Sit to/from stand Lower Body Dressing Details (indicate cue type and reason): able to get slip on shoes on but needs assistance for underwear, socks Toilet Transfer: Supervision/safety   Toileting- Clothing Manipulation and Hygiene: Supervision/safety Toileting - Clothing Manipulation Details (indicate cue type and reason): reports difficulty with  wiping due to inability to reach. Increased time. Requesting toilet aide due to difficulty with wiping at baseline but none available in hospital.     Functional mobility during ADLs: Min guard       Vision Patient Visual Report: No change from baseline       Perception     Praxis      Pertinent Vitals/Pain Pain Assessment Pain Assessment: Faces Faces Pain  Scale: Hurts whole lot Pain Location: Back Pain Descriptors / Indicators: Grimacing Pain Intervention(s): Monitored during session, Premedicated before session     Hand Dominance Right   Extremity/Trunk Assessment Upper Extremity Assessment Upper Extremity Assessment: Overall WFL for tasks assessed   Lower Extremity Assessment Lower Extremity Assessment: Overall WFL for tasks assessed   Cervical / Trunk Assessment Cervical / Trunk Assessment: Other exceptions Cervical / Trunk Exceptions: body habitus   Communication Communication Communication: No difficulties   Cognition Arousal/Alertness: Awake/alert Behavior During Therapy: WFL for tasks assessed/performed Overall Cognitive Status: Within Functional Limits for tasks assessed                                       General Comments       Exercises     Shoulder Instructions      Home Living Family/patient expects to be discharged to:: Other (Comment)                                 Additional Comments: Motel      Prior Functioning/Environment Prior Level of Function : Independent/Modified Independent             Mobility Comments: independent ADLs Comments: independent        OT Problem List: Obesity;Pain      OT Treatment/Interventions: Self-care/ADL training;Patient/family education    OT Goals(Current goals can be found in the care plan section) Acute Rehab OT Goals Patient Stated Goal: less pain OT Goal Formulation: With patient Time For Goal Achievement: 03/29/22 Potential to Achieve Goals: Good  OT Frequency: Min 1X/week    Co-evaluation              AM-PAC OT "6 Clicks" Daily Activity     Outcome Measure Help from another person eating meals?: None Help from another person taking care of personal grooming?: None Help from another person toileting, which includes using toliet, bedpan, or urinal?: None Help from another person bathing (including washing,  rinsing, drying)?: None Help from another person to put on and taking off regular upper body clothing?: None Help from another person to put on and taking off regular lower body clothing?: A Lot 6 Click Score: 22   End of Session Nurse Communication:  (o2 sats decreased with ambulation)  Activity Tolerance: Patient tolerated treatment well Patient left: in chair;with call bell/phone within reach  OT Visit Diagnosis: Pain                Time: 3614-4315 OT Time Calculation (min): 22 min Charges:  OT General Charges $OT Visit: 1 Visit OT Evaluation $OT Eval Low Complexity: 1 Low  Gustavo Lah, OTR/L [employer]  Office [phone]   Lenward Chancellor 03/15/2022, 4:09 PM

## 2022-03-15 NOTE — Progress Notes (Signed)
Chaplain tried to engage in an initial visit with Sarah Cervantes but she was sleeping when [place] arrived.  Chaplain will try to check-in later today.     03/15/22 1000  Clinical Encounter Type  Visited With Patient not available  Visit Type Initial

## 2022-03-15 NOTE — Progress Notes (Signed)
PT Cancellation Note  Patient Details Name: [name] MRN: [id number] DOB: 1980-08-24   Cancelled Treatment:    Reason Eval/Treat Not Completed: Patient declined,  Patient has declined x 2 this AM, stating that she wants to sleep. Will check back 1 more time. [place] Office [phone] Weekend [phone]   Carmisha, [place] 03/15/2022, 11:13 AM

## 2022-03-15 NOTE — Plan of Care (Signed)
  Problem: Education: Goal: Knowledge of General Education information will improve Description Including pain rating scale, medication(s)/side effects and non-pharmacologic comfort measures Outcome: Progressing   Problem: Health Behavior/Discharge Planning: Goal: Ability to manage health-related needs will improve Outcome: Progressing   

## 2022-03-15 NOTE — Progress Notes (Signed)
OT Cancellation Note  Patient Details Name: Assata Juncaj MRN: [id number] DOB: 07/24/1980   Cancelled Treatment:    Reason Eval/Treat Not Completed: Other (comment) patient refused therapy twice this morning. Will attempt therapy later in the day.   Charlann Lange 03/15/2022, 12:30 PM

## 2022-03-15 NOTE — Evaluation (Addendum)
Physical Therapy Evaluation Patient Details Name: Sarah Cervantes MRN: [id number] DOB: 1980/10/16 Today's Date: 03/15/2022  History of Present Illness  Sarah Cervantes is a 41 y.o. female with PMH significant of multiple substance abuse, obesity, sleep apnea, anxiety, type 2 diabetes mellitus, mood disorder.  Patient presented to the hospital with complaints of a fall at the motel that she is living in.  Patient currently at retro and.  While coming out of the shower slipped and fell. Found to have Closed fracture of T10 vertebra  Age indeterminant. Patient admitted for possible CHF exacerbation and pain control.  Clinical Impression  The patient  admitted for above medical problems.  Patient resides in [place], moves rooms, +/_ stairs.  Patient did ambulate a 120'  on RA with SPO2 dropped to 86/87%. Dyspnea 3/4. Patient took a seated break then  ambulated x 80'  on 2 L, Spo2 dropped to 88%.  Recommend recheck for O2 needs prior to Dc.  Patient reports that she had  covid recently and has been more SOB. Pt admitted with above diagnosis.  Pt currently with functional limitations due to the deficits listed below (see PT Problem List). Pt will benefit from skilled PT to increase their independence and safety with mobility to allow discharge to the venue listed below.      Recommendations for follow up therapy are one component of a multi-disciplinary discharge planning process, led by the attending physician.  Recommendations may be updated based on patient status, additional functional criteria and insurance authorization.  Follow Up Recommendations No PT follow up      Assistance Recommended at Discharge PRN  Patient can return home with the following  Help with stairs or ramp for entrance;Assist for transportation    Equipment Recommendations None recommended by PT  Recommendations for Other Services       Functional Status Assessment Patient has had a recent decline in their  functional status and demonstrates the ability to make significant improvements in function in a reasonable and predictable amount of time.     Precautions / Restrictions Precautions Precautions: Fall;Other (comment) Precaution Comments: monitor sats Restrictions Weight Bearing Restrictions: No   Back precautions, log roll     Mobility  Bed Mobility Overal bed mobility: Modified Independent                  Transfers Overall transfer level: Needs assistance Equipment used: None Transfers: Sit to/from Stand Sit to Stand: Supervision                Ambulation/Gait Ambulation/Gait assistance: Min guard, Min assist Gait Distance (Feet): 120 Feet (then 80) Assistive device: None, 1 person hand held assist Gait Pattern/deviations: Step-through pattern, Wide base of support Gait velocity: decr     General Gait Details: patient reported feeling dizzy, chair brought up to sit down for a rest.  Stairs            Wheelchair Mobility    Modified Rankin (Stroke Patients Only)       Balance Overall balance assessment: Mild deficits observed, not formally tested                                           Pertinent Vitals/Pain Pain Assessment Faces Pain Scale: Hurts whole lot Pain Location: Back Pain Descriptors / Indicators: Grimacing Pain Intervention(s): Monitored during session, Premedicated before session, Limited activity within patient's  tolerance    Home Living Family/patient expects to be discharged to:: Other (Comment)                   Additional Comments: Motel    Prior Function Prior Level of Function : Independent/Modified Independent             Mobility Comments: independent ADLs Comments: independent     Hand Dominance   Dominant Hand: Right    Extremity/Trunk Assessment   Upper Extremity Assessment Upper Extremity Assessment: Overall WFL for tasks assessed    Lower Extremity Assessment Lower  Extremity Assessment: Generalized weakness    Cervical / Trunk Assessment Cervical / Trunk Assessment: Other exceptions Cervical / Trunk Exceptions: body habitus  Communication   Communication: No difficulties  Cognition                                                General Comments      Exercises     Assessment/Plan    PT Assessment Patient needs continued PT services  PT Problem List Decreased strength;Decreased mobility;Obesity;Decreased activity tolerance;Cardiopulmonary status limiting activity;Decreased knowledge of use of DME;Pain       PT Treatment Interventions DME instruction;Therapeutic activities;Gait training;Therapeutic exercise;Patient/family education;Functional mobility training    PT Goals (Current goals can be found in the Care Plan section)  Acute Rehab PT Goals Patient Stated Goal: go home PT Goal Formulation: With patient Time For Goal Achievement: 03/29/22 Potential to Achieve Goals: Good    Frequency Min 3X/week     Co-evaluation               AM-PAC PT "6 Clicks" Mobility  Outcome Measure Help needed turning from your back to your side while in a flat bed without using bedrails?: None Help needed moving from lying on your back to sitting on the side of a flat bed without using bedrails?: None Help needed moving to and from a bed to a chair (including a wheelchair)?: A Little Help needed standing up from a chair using your arms (e.g., wheelchair or bedside chair)?: A Little Help needed to walk in hospital room?: A Little Help needed climbing 3-5 steps with a railing? : A Lot 6 Click Score: 19    End of Session   Activity Tolerance: Patient limited by fatigue;Treatment limited secondary to medical complications (Comment) Patient left: in chair;with call bell/phone within reach;with chair alarm set Nurse Communication: Mobility status PT Visit Diagnosis: Unsteadiness on feet (R26.81)    Time: 5329-9242 PT Time  Calculation (min) (ACUTE ONLY): 30 min   Charges:   PT Evaluation $PT Eval Low Complexity: 1 Low          Tresa Endo [place] Office [phone] Weekend [phone]   [place], [place] 03/15/2022, 4:19 PM

## 2022-03-16 ENCOUNTER — Observation Stay (HOSPITAL_COMMUNITY): Payer: 59

## 2022-03-16 ENCOUNTER — Other Ambulatory Visit (HOSPITAL_COMMUNITY): Payer: Self-pay

## 2022-03-16 DIAGNOSIS — Z87891 Personal history of nicotine dependence: Secondary | ICD-10-CM | POA: Diagnosis not present

## 2022-03-16 DIAGNOSIS — J9601 Acute respiratory failure with hypoxia: Secondary | ICD-10-CM | POA: Diagnosis not present

## 2022-03-16 DIAGNOSIS — R55 Syncope and collapse: Secondary | ICD-10-CM | POA: Diagnosis not present

## 2022-03-16 DIAGNOSIS — F1414 Cocaine abuse with cocaine-induced mood disorder: Secondary | ICD-10-CM | POA: Diagnosis present

## 2022-03-16 DIAGNOSIS — E8779 Other fluid overload: Secondary | ICD-10-CM

## 2022-03-16 DIAGNOSIS — Z91040 Latex allergy status: Secondary | ICD-10-CM | POA: Diagnosis not present

## 2022-03-16 DIAGNOSIS — F419 Anxiety disorder, unspecified: Secondary | ICD-10-CM | POA: Diagnosis present

## 2022-03-16 DIAGNOSIS — E8809 Other disorders of plasma-protein metabolism, not elsewhere classified: Secondary | ICD-10-CM | POA: Diagnosis present

## 2022-03-16 DIAGNOSIS — R0902 Hypoxemia: Secondary | ICD-10-CM | POA: Diagnosis present

## 2022-03-16 DIAGNOSIS — E119 Type 2 diabetes mellitus without complications: Secondary | ICD-10-CM | POA: Diagnosis present

## 2022-03-16 DIAGNOSIS — Y93E1 Activity, personal bathing and showering: Secondary | ICD-10-CM | POA: Diagnosis not present

## 2022-03-16 DIAGNOSIS — Z8616 Personal history of COVID-19: Secondary | ICD-10-CM

## 2022-03-16 DIAGNOSIS — G4730 Sleep apnea, unspecified: Secondary | ICD-10-CM | POA: Diagnosis present

## 2022-03-16 DIAGNOSIS — K5900 Constipation, unspecified: Secondary | ICD-10-CM | POA: Diagnosis present

## 2022-03-16 DIAGNOSIS — Z7985 Long-term (current) use of injectable non-insulin antidiabetic drugs: Secondary | ICD-10-CM | POA: Diagnosis not present

## 2022-03-16 DIAGNOSIS — W182XXA Fall in (into) shower or empty bathtub, initial encounter: Secondary | ICD-10-CM | POA: Diagnosis present

## 2022-03-16 DIAGNOSIS — E8770 Fluid overload, unspecified: Secondary | ICD-10-CM

## 2022-03-16 DIAGNOSIS — Y9259 Other trade areas as the place of occurrence of the external cause: Secondary | ICD-10-CM | POA: Diagnosis not present

## 2022-03-16 DIAGNOSIS — F4310 Post-traumatic stress disorder, unspecified: Secondary | ICD-10-CM | POA: Diagnosis present

## 2022-03-16 DIAGNOSIS — Z6841 Body Mass Index (BMI) 40.0 and over, adult: Secondary | ICD-10-CM | POA: Diagnosis not present

## 2022-03-16 DIAGNOSIS — F315 Bipolar disorder, current episode depressed, severe, with psychotic features: Secondary | ICD-10-CM | POA: Diagnosis not present

## 2022-03-16 DIAGNOSIS — Z79899 Other long term (current) drug therapy: Secondary | ICD-10-CM | POA: Diagnosis not present

## 2022-03-16 DIAGNOSIS — S22079A Unspecified fracture of T9-T10 vertebra, initial encounter for closed fracture: Secondary | ICD-10-CM | POA: Diagnosis not present

## 2022-03-16 LAB — COMPREHENSIVE METABOLIC PANEL
ALT: 24 U/L (ref 0–44)
AST: 21 U/L (ref 15–41)
Albumin: 2.9 g/dL — ABNORMAL LOW (ref 3.5–5.0)
Alkaline Phosphatase: 49 U/L (ref 38–126)
Anion gap: 6 (ref 5–15)
BUN: 18 mg/dL (ref 6–20)
CO2: 32 mmol/L (ref 22–32)
Calcium: 8.4 mg/dL — ABNORMAL LOW (ref 8.9–10.3)
Chloride: 101 mmol/L (ref 98–111)
Creatinine, Ser: 0.94 mg/dL (ref 0.44–1.00)
GFR, Estimated: >60 mL/min (ref >60)
Glucose, Bld: 102 mg/dL — ABNORMAL HIGH (ref 70–99)
Potassium: 3.9 mmol/L (ref 3.5–5.1)
Sodium: 139 mmol/L (ref 135–145)
Total Bilirubin: 0.9 mg/dL (ref 0.3–1.2)
Total Protein: 6.1 g/dL — ABNORMAL LOW (ref 6.5–8.1)

## 2022-03-16 LAB — CBC WITH DIFFERENTIAL/PLATELET
Abs Immature Granulocytes: 0.02 10*3/uL (ref 0.00–0.07)
Basophils Absolute: 0 10*3/uL (ref 0.0–0.1)
Basophils Relative: 0 %
Eosinophils Absolute: 0.2 10*3/uL (ref 0.0–0.5)
Eosinophils Relative: 4 %
HCT: 41.9 % (ref 36.0–46.0)
Hemoglobin: 13.1 g/dL (ref 12.0–15.0)
Immature Granulocytes: 0 %
Lymphocytes Relative: 41 %
Lymphs Abs: 2.3 10*3/uL (ref 0.7–4.0)
MCH: 31.3 pg (ref 26.0–34.0)
MCHC: 31.3 g/dL (ref 30.0–36.0)
MCV: 100 fL (ref 80.0–100.0)
Monocytes Absolute: 0.5 10*3/uL (ref 0.1–1.0)
Monocytes Relative: 9 %
Neutro Abs: 2.6 10*3/uL (ref 1.7–7.7)
Neutrophils Relative %: 46 %
Platelets: 311 10*3/uL (ref 150–400)
RBC: 4.19 MIL/uL (ref 3.87–5.11)
RDW: 14.1 % (ref 11.5–15.5)
WBC: 5.7 10*3/uL (ref 4.0–10.5)
nRBC: 0 % (ref 0.0–0.2)

## 2022-03-16 LAB — HIV ANTIBODY (ROUTINE TESTING W REFLEX): HIV Screen 4th Generation wRfx: NONREACTIVE

## 2022-03-16 LAB — PHOSPHORUS: Phosphorus: 3.7 mg/dL (ref 2.5–4.6)

## 2022-03-16 LAB — GLUCOSE, CAPILLARY
Glucose-Capillary: 106 mg/dL — ABNORMAL HIGH (ref 70–99)
Glucose-Capillary: 94 mg/dL (ref 70–99)

## 2022-03-16 LAB — MAGNESIUM: Magnesium: 2.3 mg/dL (ref 1.7–2.4)

## 2022-03-16 MED ORDER — GABAPENTIN 400 MG PO CAPS: 400.0000 mg | ORAL_CAPSULE | Freq: Three times a day (TID) | ORAL | 0 refills | Status: DC

## 2022-03-16 MED ORDER — GABAPENTIN 400 MG PO CAPS
400.0000 mg | ORAL_CAPSULE | Freq: Three times a day (TID) | ORAL | 0 refills | Status: DC
  Filled 2022-03-16: qty 90, 30d supply, fill #0

## 2022-03-16 MED ORDER — ENOXAPARIN SODIUM 80 MG/0.8ML IJ SOSY: 80.0000 mg | PREFILLED_SYRINGE | INTRAMUSCULAR | Status: DC

## 2022-03-16 MED ORDER — SENNA 8.6 MG PO TABS
1.0000 | ORAL_TABLET | Freq: Every day | ORAL | 0 refills | Status: DC
  Filled 2022-03-16: qty 30, 30d supply, fill #0

## 2022-03-16 MED ORDER — ACETAMINOPHEN 325 MG PO TABS
650.0000 mg | ORAL_TABLET | Freq: Four times a day (QID) | ORAL | 0 refills | Status: DC | PRN
  Filled 2022-03-16: qty 20, 3d supply, fill #0

## 2022-03-16 MED ORDER — LIDOCAINE 5 % EX PTCH
1.0000 | MEDICATED_PATCH | CUTANEOUS | 0 refills | Status: DC
  Filled 2022-03-16: qty 30, 30d supply, fill #0

## 2022-03-16 MED ORDER — DOCUSATE SODIUM 100 MG PO CAPS
100.0000 mg | ORAL_CAPSULE | Freq: Two times a day (BID) | ORAL | 0 refills | Status: DC
  Filled 2022-03-16: qty 100, 50d supply, fill #0

## 2022-03-16 MED ORDER — METHOCARBAMOL 500 MG PO TABS
500.0000 mg | ORAL_TABLET | Freq: Three times a day (TID) | ORAL | 0 refills | Status: DC | PRN
  Filled 2022-03-16: qty 20, 7d supply, fill #0

## 2022-03-16 MED ORDER — FUROSEMIDE 40 MG PO TABS
40.0000 mg | ORAL_TABLET | Freq: Every day | ORAL | 0 refills | Status: DC
  Filled 2022-03-16: qty 30, 30d supply, fill #0

## 2022-03-16 MED ORDER — ONDANSETRON HCL 4 MG PO TABS
4.0000 mg | ORAL_TABLET | Freq: Four times a day (QID) | ORAL | 0 refills | Status: DC | PRN
  Filled 2022-03-16: qty 18, 5d supply, fill #0

## 2022-03-16 NOTE — TOC Progression Note (Signed)
Transition of Care [facility] - Progression Note    Patient Details  Name: Sarah Cervantes MRN: [id number] Date of Birth: 1981/04/27  Transition of Care [facility]) CM/SW [place], RN Phone Number: 03/16/2022, 12:39 PM  Clinical Narrative:   Patient is not appropriate for CHF protocol. Spoke with patient regarding SA, agreed to receiving substance abuse resources, attached to AVS. Patient reports she is staying with a friend at a motel and her friend will transport her from the hospital at discharge. Patient reports she is unable to afford her medications. This RNCM advised due to having medical insurance she does not qualify for the [facility] medication assistance program.  Notified [facility] out patient pharmacy who will provide meds to bed services, which will allow pharmacy to bill the patient since she does not have finances today, notified patient, RN, MD.   No additional TOC needs at this time.         Expected Discharge Plan: Home/Self Care Barriers to Discharge: No Barriers Identified  Expected Discharge Plan and Services Expected Discharge Plan: Home/Self Care In-house Referral: NA Discharge Planning Services: CM Consult Post Acute Care Choice: NA Living arrangements for the past 2 months: Single Family Home Expected Discharge Date: 03/16/22               DME Arranged: N/A DME Agency: NA       HH Arranged: NA HH Agency: NA         Social Determinants of Health (SDOH) Interventions    Readmission Risk Interventions     No data to display

## 2022-03-16 NOTE — Progress Notes (Signed)
This RN contacted [facility] outpatient pharmacy at 1445 regarding patient medications being delivered to her room. They informed me the pharmacist was currently working on them. This information was given to the patient as well, and patient informed me that her ride would not wait on her if it took too long. This RN offered a cab voucher if needed to ensure pt received medications. At 1500 this RN went into pt room to check status of meds and patient was not present. MD and CM made aware patient left without medications in hand.

## 2022-03-16 NOTE — Discharge Summary (Signed)
Physician Discharge Summary   Patient: Sarah Cervantes MRN: [id number] DOB: 1981-01-06  Admit date:     03/14/2022  Discharge date: 03/16/22  Discharge Physician: Raiford Noble, DO   PCP: Patient, No Pcp Per   Recommendations at discharge:   Follow-up with PCP within 1 to 2 weeks and repeat CBC, CMP, mag, Phos within 1 week Follow-up with interventional radiology in outpatient setting for evaluation for kyphoplasty.  Patient did not want inpatient evaluation Avoid illicit substances  Discharge Diagnoses: Principal Problem:   Near syncope Active Problems:   Cocaine abuse with cocaine-induced mood disorder (HCC)   PTSD (post-traumatic stress disorder)   Bipolar I disorder, current or most recent episode depressed, with psychotic features ([place])   Constipation   Substance induced mood disorder (HCC)   Closed fracture of T10 vertebra (HCC)   Acute respiratory failure with hypoxia (HCC)   Volume overload   History of COVID-19  Resolved Problems:   * No resolved hospital problems. [facility] Course: The patient is a 41 year old super morbidly obese Caucasian female with a past medical history significant for but not limited to multiple substance abuse, morbid obesity, sleep apnea, anxiety and mood disorder, diabetes mellitus type 2 as well as other comorbidities who presented to the hospital complains of a fall at her motel that she was living.  She states that she is coming out of the shower and slipped and fell at midnight a passing out or head or neck injury.  She did report severe back pain involving her entire back and had no nausea or vomiting.  Given that she was unable to get up on her own due to the pain she called 911 and at the time of arrival by EMS she had reported another fall due to a near syncopal event and denies having any similar falls in the past.  She denies any nausea or vomiting.  She denies any changes in medications and had no chest pain.  She does drink alcohol  and drinks 2 beers the night before and reports formerly smoking 1 pack/day but quit a few months ago.  She continues to abuse cocaine and this was the night before prior to admission and denied any other substance abuse and does reportedly take Adderall 3 days ago.  She is in the process of getting a CPAP through her PCP and reports that she has been Lasix in the past but does not take that medication currently.  Currently she improved and was able to be weaned off of supplemental oxygen.  Will discharge on p.o. Lasix.  Patient continues to complain of some back pain but did not want any further work-up including evaluation for possible kyphoplasty.  She is deemed medically stable for discharge given her improvement and will need to follow-up with PCP and outpatient evaluation with interventional radiology for discussion of a kyphoplasty if necessary  Assessment and Plan:  Near syncope, improved -Likely multifactorial. -Substance abuse possible CHF exacerbation and volume overload -For now monitor on telemetry. -PT OT consult recommending no follow-up -Orthostatic vitals will be difficult secondary to patient's ongoing pain and because she is on Lasix now; continues to feel -Check in the a.m. and ambulate in the morning and she did okay.   Volume overload with acute on chronic diastolic CHF likely ruled out Acute hypoxic respiratory failure in the setting of recent COVID 19 infection and suspected OHS and OSA -Patient is currently requiring 2 L of oxygen.  Saturation drops down to 84% on  room air and desaturated on Ambulatory Screen  -SpO2: 96 % O2 Flow Rate (L/min): 2 L/min -Chest x-ray not remarkable. -Examination difficult due to her body habitus. -Patient does have swelling of her legs. -We will continue with IV Lasix and monitor response but changed to p.o. Lasix 40 mg p.o. daily -Check echocardiogram showed a left ventricular ejection fraction by estimation to be 60 to 65% with normal LV  function and no regional wall motion abnormalities and there was mild concentric left ventricular hypertrophy with left ventricular diastolic parameters being normal and the right ventricular systolic function was normal -Strict I's and O's and daily weights -Accurate weights have not been done and accurate ins and outs have not been recorded either -Continue to monitor volume status carefully and repeat chest x-ray in the a.m. and will need an Ambulatory O2 screen again in the morning -She did not desaturate on ambulatory home O2 screen and chest x-ray today showed  "Cardiomegaly with mild central vascular prominence without visible edema.   Lower inspiration than previously with increased atelectatic changes and increasingly elevated right diaphragm. Visualized lungs otherwise clear but with very limited visualization of the lower zones particularly on the right.   PA and lateral follow-up study in full inspiration is recommended" -Recent COVID-19 may be contributing but she will need further evaluation and follow-up with the pulmonologist and sleep study   Cocaine abuse with cocaine-induced mood disorder ([place]) PTSD (post-traumatic stress disorder) Bipolar I disorder, current or most recent episode depressed, with psychotic features ([place]) -Patient's UDS is positive for cocaine, amphetamine, cannabis and barbiturates. -Patient states that she is only abusing cocaine and has used Adderall a few days ago. -Education officer, museum consult. -Continue Lorazepam as needed and patient appears stable   Hypoalbuminemia -Patient's albumin level now 2.9 again -Continue to monitor trend and repeat CMP in a.m.   Constipation, improving -Will initiate bowel regimen and she is now on docusate sodium 100 mg p.o. twice daily and senna 8.6 mg p.o. twice daily -Patient also has a bisacodyl 10 mg rectal suppository daily as needed for moderate constipation   Closed fracture of T10 vertebra (HCC) -Age  indeterminant. -Possible injury this admission.  May actually have sustained injury prior to this admission as well. -CT Thoracic Spine done and showed "Age-indeterminate T10 compression deformity with 20% height loss. No bony retropulsion. Motion artifact through the T10-T11 level somewhat limits evaluation. No acute lumbar spine fracture. Small right pleural effusion." -For now continue pain control with oxycodone 5 mg every 6 as needed for severe pain, methocarbamol 500 g p.o. 3 times daily, lidocaine patch 2 patches transdermally every 12 hours, and ketorolac 30 mg IV every 6 -Minimize narcotics due tohigh risk for sleep apnea.   -Patient is still complain of back pain but this is improved and she is able to ambulate.  Did not want to discuss about possible kyphoplasty and states that she is doing better.  Requested narcotic prescription for discharge but not given given her illicit drug use history   Prediabetes -Patient's hemoglobin A1c was 5.9 -CBGs ranging from 99-139 -Continue monitor blood sugars per protocol and she is on moderate NovoLog sliding scale insulin AC   Super Morbid Obesity -Complicates overall prognosis and care -Estimated body mass index is 61.8 kg/m as calculated from the following:   Height as of this encounter: '5\' 4"'$  (1.626 m).   Weight as of this encounter: 163.3 kg.  -Weight Loss and Dietary Counseling given  Consultants: None Procedures performed: As  above  Disposition: Home Diet recommendation:  Discharge Diet Orders (From admission, onward)     Start     Ordered   03/16/22 0000  Diet - low sodium heart healthy        03/16/22 1204           Cardiac diet DISCHARGE MEDICATION: Allergies as of 03/16/2022       Reactions   Latex Swelling, Rash, Other (See Comments)   Hands swell        Medication List     STOP taking these medications    DULoxetine 30 MG capsule Commonly known as: CYMBALTA   enalapril 5 MG tablet Commonly known as:  VASOTEC       TAKE these medications    acetaminophen 325 MG tablet Commonly known as: TYLENOL Take 2 tablets (650 mg total) by mouth every 6 (six) hours as needed for mild pain (or Fever >/= 101).   albuterol 108 (90 Base) MCG/ACT inhaler Commonly known as: VENTOLIN HFA Inhale 2 puffs into the lungs every 6 (six) hours as needed for wheezing or shortness of breath.   ARIPiprazole 5 MG tablet Commonly known as: ABILIFY Take 1 tablet (5 mg total) by mouth daily.   docusate sodium 100 MG capsule Commonly known as: COLACE Take 1 capsule (100 mg total) by mouth 2 (two) times daily.   FLUoxetine 40 MG capsule Commonly known as: PROZAC Take 40 mg by mouth in the morning.   furosemide 40 MG tablet Commonly known as: Lasix Take 1 tablet (40 mg total) by mouth daily.   gabapentin 400 MG capsule Commonly known as: NEURONTIN Take 1 capsule (400 mg total) by mouth 3 (three) times daily.   hydrocortisone 2.5 % rectal cream Commonly known as: ANUSOL-HC Place rectally 3 (three) times daily.   hydrOXYzine 50 MG tablet Commonly known as: ATARAX Take 1 tablet (50 mg total) by mouth 3 (three) times daily as needed for anxiety.   ibuprofen 200 MG tablet Commonly known as: ADVIL Take 600 mg by mouth every 6 (six) hours as needed for headache or mild pain.   lidocaine 5 % Commonly known as: LIDODERM Place 1 patch onto the skin daily. Remove & Discard patch within 12 hours or as directed by MD   lisinopril 10 MG tablet Commonly known as: ZESTRIL Take 10 mg by mouth in the morning.   methocarbamol 500 MG tablet Commonly known as: ROBAXIN Take 1 tablet (500 mg total) by mouth every 8 (eight) hours as needed for muscle spasms.   nicotine polacrilex 2 MG gum Commonly known as: NICORETTE Take 1 each (2 mg total) by mouth as needed for smoking cessation.   ondansetron 4 MG tablet Commonly known as: ZOFRAN Take 1 tablet (4 mg total) by mouth every 6 (six) hours as needed for  nausea.   Ozempic (0.25 or 0.5 MG/DOSE) 2 MG/1.5ML Sopn Generic drug: Semaglutide(0.25 or 0.'5MG'$ /DOS) Inject 0.5 mg into the skin every Monday.   prazosin 1 MG capsule Commonly known as: MINIPRESS Take 1 capsule (1 mg total) by mouth at bedtime.   senna 8.6 MG Tabs tablet Commonly known as: SENOKOT Take 1 tablet (8.6 mg total) by mouth daily.   topiramate 50 MG tablet Commonly known as: TOPAMAX Take 1 tablet (50 mg total) by mouth at bedtime.   traZODone 100 MG tablet Commonly known as: DESYREL Take 1 tablet (100 mg total) by mouth at bedtime and may repeat dose one time if needed.  Durable Medical Equipment  (From admission, onward)           Start     Ordered   03/14/22 1731  For home use only DME continuous positive airway pressure (CPAP)  Once       Question Answer Comment  Length of Need Lifetime   Patient has OSA or probable OSA Yes   Is the patient currently using CPAP in the home No   Settings Autotitration   CPAP supplies needed Mask, headgear, cushions, filters, heated tubing and water chamber      03/14/22 1730           Discharge Exam: Filed Weights   03/15/22 0114 03/15/22 1650 03/16/22 0500  Weight: (!) 161.2 kg (!) 162.8 kg (!) 163.3 kg   Vitals:   03/16/22 0335 03/16/22 1304  BP: (!) 146/84 137/85  Pulse: 74 90  Resp: 20 18  Temp: 97.6 F (36.4 C) 98.1 F (36.7 C)  SpO2: 95% (!) 89%   Examination: Physical Exam:  Constitutional: WN/WD super morbidly obese Caucasian female in no acute distress appears calm but slightly uncomfortable complaints of back pain Respiratory: Diminished to auscultation bilaterally with coarse breath sounds, no wheezing, rales, rhonchi or crackles. Normal respiratory effort and patient is not tachypenic. No accessory muscle use.  Unlabored breathing but wearing supplemental oxygen via nasal cannula Cardiovascular: RRR, no murmurs / rubs / gallops. S1 and S2 auscultated.  Mild lower extremity  edema Abdomen: Soft, non-tender, distended secondary body habitus bowel sounds positive.  GU: Deferred. Musculoskeletal: No clubbing / cyanosis of digits/nails. No joint deformity upper and lower extremities. Skin: No rashes, lesions, ulcers limited skin evaluation. No induration; Warm and dry.  Neurologic: CN 2-12 grossly intact with no focal deficits.Romberg sign cerebellar reflexes not assessed.  Psychiatric: Normal judgment and insight. Alert and oriented x 3. Normal mood and appropriate affect.   Condition at discharge: stable  The results of significant diagnostics from this hospitalization (including imaging, microbiology, ancillary and laboratory) are listed below for reference.   Imaging Studies: DG CHEST PORT 1 VIEW  Result Date: 03/16/2022 CLINICAL DATA:  14:18 a.m. 0 1 shortness of breath. EXAM: PORTABLE CHEST 1 VIEW COMPARISON:  Portable chest 03/14/2022 FINDINGS: Mild-to-moderate cardiomegaly. There is central vascular prominence without overt edema. There are decreased lung volumes and increased elevation of the right hemidiaphragm and increased left mid perihilar atelectatic bands. The visualized lungs are otherwise generally clear but there is limited visualization of the right lower zone. Right lower lobe pneumonia could be hidden behind the diaphragm. No pleural effusion is seen. Is a stable mediastinum. Intact thoracic cage. IMPRESSION: Cardiomegaly with mild central vascular prominence without visible edema. Lower inspiration than previously with increased atelectatic changes and increasingly elevated right diaphragm. Visualized lungs otherwise clear but with very limited visualization of the lower zones particularly on the right. PA and lateral follow-up study in full inspiration is recommended. Electronically Signed   By: Telford Nab M.D.   On: 03/16/2022 07:23   ECHOCARDIOGRAM COMPLETE  Result Date: 03/15/2022    ECHOCARDIOGRAM REPORT   Patient Name:   Sarah Cervantes Date of Exam: 03/15/2022 Medical Rec #:  [id number]         Height:       64.0 in Accession #:    [id number]        Weight:       355.3 lb Date of Birth:  10-Apr-1981         BSA:  2.498 m Patient Age:    41 years          BP:           115/71 mmHg Patient Gender: F                 HR:           76 bpm. Exam Location:  Inpatient Procedure: 2D Echo and Intracardiac Opacification Agent Indications:    CHF  History:        Patient has prior history of Echocardiogram examinations, most                 recent 03/24/2018.  Sonographer:    Harvie Junior Referring Phys: [id number] [facility] M PATEL  Sonographer Comments: Technically difficult study due to poor echo windows, no subcostal window and patient is obese. Image acquisition challenging due to patient body habitus. IMPRESSIONS  1. Left ventricular ejection fraction, by estimation, is 60 to 65%. The left ventricle has normal function. The left ventricle has no regional wall motion abnormalities. There is mild concentric left ventricular hypertrophy. Left ventricular diastolic parameters were normal.  2. Right ventricular systolic function is normal. The right ventricular size is normal.  3. The mitral valve is normal in structure. No evidence of mitral valve regurgitation. No evidence of mitral stenosis.  4. The aortic valve is tricuspid. Aortic valve regurgitation is not visualized. Aortic valve sclerosis/calcification is present, without any evidence of aortic stenosis.  5. The inferior vena cava is normal in size with greater than 50% respiratory variability, suggesting right atrial pressure of 3 mmHg.  6. Technically difficult study due to poor sound wave transmission. FINDINGS  Left Ventricle: Left ventricular ejection fraction, by estimation, is 60 to 65%. The left ventricle has normal function. The left ventricle has no regional wall motion abnormalities. The left ventricular internal cavity size was normal in size. There is  mild concentric left  ventricular hypertrophy. Left ventricular diastolic parameters were normal. Right Ventricle: The right ventricular size is normal. No increase in right ventricular wall thickness. Right ventricular systolic function is normal. Left Atrium: Left atrial size was normal in size. Right Atrium: Right atrial size was normal in size. Pericardium: There is no evidence of pericardial effusion. Mitral Valve: The mitral valve is normal in structure. No evidence of mitral valve regurgitation. No evidence of mitral valve stenosis. Tricuspid Valve: The tricuspid valve is normal in structure. Tricuspid valve regurgitation is not demonstrated. No evidence of tricuspid stenosis. Aortic Valve: The aortic valve is tricuspid. Aortic valve regurgitation is not visualized. Aortic valve sclerosis/calcification is present, without any evidence of aortic stenosis. Aortic valve mean gradient measures 7.0 mmHg. Aortic valve peak gradient measures 14.0 mmHg. Aortic valve area, by VTI measures 2.68 cm. Pulmonic Valve: The pulmonic valve was normal in structure. Pulmonic valve regurgitation is not visualized. No evidence of pulmonic stenosis. Aorta: The aortic root is normal in size and structure. Venous: The inferior vena cava is normal in size with greater than 50% respiratory variability, suggesting right atrial pressure of 3 mmHg. IAS/Shunts: No atrial level shunt detected by color flow Doppler.  LEFT VENTRICLE PLAX 2D LVIDd:         4.00 cm     Diastology LVIDs:         2.50 cm     LV e' medial:    7.83 cm/s LV PW:         1.20 cm     LV E/e' medial:  14.6 LV IVS:        1.20 cm     LV e' lateral:   7.29 cm/s LVOT diam:     2.30 cm     LV E/e' lateral: 15.6 LV SV:         81 LV SV Index:   33 LVOT Area:     4.15 cm  LV Volumes (MOD) LV vol d, MOD A2C: 57.3 ml LV vol d, MOD A4C: 76.3 ml LV vol s, MOD A2C: 32.1 ml LV vol s, MOD A4C: 46.7 ml LV SV MOD A2C:     25.2 ml LV SV MOD A4C:     76.3 ml LV SV MOD BP:      25.5 ml RIGHT VENTRICLE RV S  prime:     15.70 cm/s TAPSE (M-mode): 1.9 cm LEFT ATRIUM           Index LA diam:      3.60 cm 1.44 cm/m LA Vol (A4C): 58.3 ml 23.34 ml/m  AORTIC VALVE                     PULMONIC VALVE AV Area (Vmax):    3.02 cm      PV Vmax:       0.98 m/s AV Area (Vmean):   2.94 cm      PV Peak grad:  3.8 mmHg AV Area (VTI):     2.68 cm AV Vmax:           187.00 cm/s AV Vmean:          122.000 cm/s AV VTI:            0.304 m AV Peak Grad:      14.0 mmHg AV Mean Grad:      7.0 mmHg LVOT Vmax:         136.00 cm/s LVOT Vmean:        86.400 cm/s LVOT VTI:          0.196 m LVOT/AV VTI ratio: 0.64  AORTA Ao Root diam: 3.30 cm MITRAL VALVE                TRICUSPID VALVE MV Area (PHT): 3.48 cm     TV Peak grad:   16.0 mmHg MV Decel Time: 218 msec     TV Vmax:        2.00 m/s MV E velocity: 114.00 cm/s MV A velocity: 105.00 cm/s  SHUNTS MV E/A ratio:  1.09         Systemic VTI:  0.20 m                             Systemic Diam: 2.30 cm Glori Bickers MD Electronically signed by Glori Bickers MD Signature Date/Time: 03/15/2022/4:28:06 PM    Final    CT Thoracic Spine Wo Contrast  Result Date: 03/14/2022 CLINICAL DATA:  Back trauma, no prior imaging (Age >= 16y) trauma; fall; trauma; fell on back, severe midline pain EXAM: CT THORACIC AND LUMBAR SPINE WITHOUT CONTRAST TECHNIQUE: Multidetector CT imaging of the thoracic and lumbar spine was performed without contrast. Multiplanar CT image reconstructions were also generated. RADIATION DOSE REDUCTION: This exam was performed according to the departmental dose-optimization program which includes automated exposure control, adjustment of the mA and/or kV according to patient size and/or use of iterative reconstruction technique. COMPARISON:  CT 07/15/2021. FINDINGS: CT THORACIC SPINE FINDINGS Alignment: Normal. Vertebrae: There  is an age-indeterminate T10 compression deformity with 20% height loss. No bony retropulsion. Unchanged anterior superior sclerosis at T12. Congenital  variant left first rib. Motion artifact through T10 and T11 limits fine bone and soft-tissue detail. Paraspinal and other soft tissues: Small right pleural effusion. Disc levels: There is moderate multilevel degenerative disc disease with bulky anterolateral osteophytes in the thoracic spine, most prominent at T9-T10 and T10-T11. No visible impingement. CT LUMBAR SPINE FINDINGS Segmentation: 5 lumbar type vertebrae. Alignment: Normal. Vertebrae: No acute fracture or focal pathologic process. Paraspinal and other soft tissues: Negative. Disc levels: No visible impingement. Mild disc bulging at L3-L4 and L4-L5. Mild multilevel facet arthropathy. IMPRESSION: Age-indeterminate T10 compression deformity with 20% height loss. No bony retropulsion. Motion artifact through the T10-T11 level somewhat limits evaluation. No acute lumbar spine fracture. Small right pleural effusion. Electronically Signed   By: Maurine Simmering M.D.   On: 03/14/2022 14:16   CT Lumbar Spine Wo Contrast  Result Date: 03/14/2022 CLINICAL DATA:  Back trauma, no prior imaging (Age >= 16y) trauma; fall; trauma; fell on back, severe midline pain EXAM: CT THORACIC AND LUMBAR SPINE WITHOUT CONTRAST TECHNIQUE: Multidetector CT imaging of the thoracic and lumbar spine was performed without contrast. Multiplanar CT image reconstructions were also generated. RADIATION DOSE REDUCTION: This exam was performed according to the departmental dose-optimization program which includes automated exposure control, adjustment of the mA and/or kV according to patient size and/or use of iterative reconstruction technique. COMPARISON:  CT 07/15/2021. FINDINGS: CT THORACIC SPINE FINDINGS Alignment: Normal. Vertebrae: There is an age-indeterminate T10 compression deformity with 20% height loss. No bony retropulsion. Unchanged anterior superior sclerosis at T12. Congenital variant left first rib. Motion artifact through T10 and T11 limits fine bone and soft-tissue detail.  Paraspinal and other soft tissues: Small right pleural effusion. Disc levels: There is moderate multilevel degenerative disc disease with bulky anterolateral osteophytes in the thoracic spine, most prominent at T9-T10 and T10-T11. No visible impingement. CT LUMBAR SPINE FINDINGS Segmentation: 5 lumbar type vertebrae. Alignment: Normal. Vertebrae: No acute fracture or focal pathologic process. Paraspinal and other soft tissues: Negative. Disc levels: No visible impingement. Mild disc bulging at L3-L4 and L4-L5. Mild multilevel facet arthropathy. IMPRESSION: Age-indeterminate T10 compression deformity with 20% height loss. No bony retropulsion. Motion artifact through the T10-T11 level somewhat limits evaluation. No acute lumbar spine fracture. Small right pleural effusion. Electronically Signed   By: Maurine Simmering M.D.   On: 03/14/2022 14:16   CT Cervical Spine Wo Contrast  Result Date: 03/14/2022 CLINICAL DATA:  Neck pain after fall. EXAM: CT HEAD WITHOUT CONTRAST CT CERVICAL SPINE WITHOUT CONTRAST TECHNIQUE: Multidetector CT imaging of the head and cervical spine was performed following the standard protocol without intravenous contrast. Multiplanar CT image reconstructions of the cervical spine were also generated. However, visualization of the cervical spine is severely limited due to artifact from body habitus. RADIATION DOSE REDUCTION: This exam was performed according to the departmental dose-optimization program which includes automated exposure control, adjustment of the mA and/or kV according to patient size and/or use of iterative reconstruction technique. COMPARISON:  July 15, 2021. FINDINGS: CT HEAD FINDINGS Brain: No evidence of acute infarction, hemorrhage, hydrocephalus, extra-axial collection or mass lesion/mass effect. Vascular: No hyperdense vessel or unexpected calcification. Skull: Normal. Negative for fracture or focal lesion. Sinuses/Orbits: No acute finding. Other: None. CT CERVICAL  SPINE FINDINGS Alignment: Grossly normal, although limited as described above. Skull base and vertebrae: Fracture cannot be excluded on the basis of this exam due  to limited evaluation of the vertebral bodies due to artifact from body habitus. Soft tissues and spinal canal: Significantly limited evaluation due to body habitus. Pathology cannot be excluded. Disc levels: Pathology cannot be excluded due to significant limitations as described above. Upper chest: Negative. Other: None. IMPRESSION: No acute intracranial abnormality seen. Due to significant limitations of the images of the cervical spine secondary to attenuation artifact from body habitus, fracture or other pathology involving the cervical spine cannot be excluded on the basis of this exam. Electronically Signed   By: Marijo Conception M.D.   On: 03/14/2022 13:55   CT Head Wo Contrast  Result Date: 03/14/2022 CLINICAL DATA:  Neck pain after fall. EXAM: CT HEAD WITHOUT CONTRAST CT CERVICAL SPINE WITHOUT CONTRAST TECHNIQUE: Multidetector CT imaging of the head and cervical spine was performed following the standard protocol without intravenous contrast. Multiplanar CT image reconstructions of the cervical spine were also generated. However, visualization of the cervical spine is severely limited due to artifact from body habitus. RADIATION DOSE REDUCTION: This exam was performed according to the departmental dose-optimization program which includes automated exposure control, adjustment of the mA and/or kV according to patient size and/or use of iterative reconstruction technique. COMPARISON:  July 15, 2021. FINDINGS: CT HEAD FINDINGS Brain: No evidence of acute infarction, hemorrhage, hydrocephalus, extra-axial collection or mass lesion/mass effect. Vascular: No hyperdense vessel or unexpected calcification. Skull: Normal. Negative for fracture or focal lesion. Sinuses/Orbits: No acute finding. Other: None. CT CERVICAL SPINE FINDINGS Alignment:  Grossly normal, although limited as described above. Skull base and vertebrae: Fracture cannot be excluded on the basis of this exam due to limited evaluation of the vertebral bodies due to artifact from body habitus. Soft tissues and spinal canal: Significantly limited evaluation due to body habitus. Pathology cannot be excluded. Disc levels: Pathology cannot be excluded due to significant limitations as described above. Upper chest: Negative. Other: None. IMPRESSION: No acute intracranial abnormality seen. Due to significant limitations of the images of the cervical spine secondary to attenuation artifact from body habitus, fracture or other pathology involving the cervical spine cannot be excluded on the basis of this exam. Electronically Signed   By: Marijo Conception M.D.   On: 03/14/2022 13:55   DG Chest Port 1 View  Result Date: 03/14/2022 CLINICAL DATA:  Shortness of breath. EXAM: PORTABLE CHEST 1 VIEW COMPARISON:  Chest radiograph dated 07/15/2021. FINDINGS: No focal consolidation, pleural effusion, or pneumothorax. Top-normal cardiac silhouette. No acute osseous pathology. IMPRESSION: No active disease. Electronically Signed   By: Anner Crete M.D.   On: 03/14/2022 12:00    Microbiology: Results for orders placed or performed during the hospital encounter of 07/15/21  Resp Panel by RT-PCR (Flu A&B, Covid) Nasopharyngeal Swab     Status: None   Collection Time: 07/15/21  4:03 PM   Specimen: Nasopharyngeal Swab; Nasopharyngeal(NP) swabs in vial transport medium  Result Value Ref Range Status   SARS Coronavirus 2 by RT PCR NEGATIVE NEGATIVE Final    Comment: (NOTE) SARS-CoV-2 target nucleic acids are NOT DETECTED.  The SARS-CoV-2 RNA is generally detectable in upper respiratory specimens during the acute phase of infection. The lowest concentration of SARS-CoV-2 viral copies this assay can detect is 138 copies/mL. A negative result does not preclude SARS-Cov-2 infection and should not  be used as the sole basis for treatment or other patient management decisions. A negative result may occur with  improper specimen collection/handling, submission of specimen other than nasopharyngeal swab, presence of  viral mutation(s) within the areas targeted by this assay, and inadequate number of viral copies(<138 copies/mL). A negative result must be combined with clinical observations, patient history, and epidemiological information. The expected result is Negative.  Fact Sheet for Patients:  EntrepreneurPulse.com.au  Fact Sheet for Healthcare Providers:  IncredibleEmployment.be  This test is no t yet approved or cleared by the [place] FDA and  has been authorized for detection and/or diagnosis of SARS-CoV-2 by FDA under an Emergency Use Authorization (EUA). This EUA will remain  in effect (meaning this test can be used) for the duration of the COVID-19 declaration under Section 564(b)(1) of the Act, 21 U.S.C.section 360bbb-3(b)(1), unless the authorization is terminated  or revoked sooner.       Influenza A by PCR NEGATIVE NEGATIVE Final   Influenza B by PCR NEGATIVE NEGATIVE Final    Comment: (NOTE) The Xpert Xpress SARS-CoV-2/FLU/RSV plus assay is intended as an aid in the diagnosis of influenza from Nasopharyngeal swab specimens and should not be used as a sole basis for treatment. Nasal washings and aspirates are unacceptable for Xpert Xpress SARS-CoV-2/FLU/RSV testing.  Fact Sheet for Patients: EntrepreneurPulse.com.au  Fact Sheet for Healthcare Providers: IncredibleEmployment.be  This test is not yet approved or cleared by the [place] FDA and has been authorized for detection and/or diagnosis of SARS-CoV-2 by FDA under an Emergency Use Authorization (EUA). This EUA will remain in effect (meaning this test can be used) for the duration of the COVID-19 declaration under Section  564(b)(1) of the Act, 21 U.S.C. section 360bbb-3(b)(1), unless the authorization is terminated or revoked.  Performed at [facility], [place] [address], [place], [place] [address]     Labs: CBC: Recent Labs  Lab 03/14/22 1201 03/15/22 0425 03/16/22 0409  WBC 6.5 6.0 5.7  NEUTROABS 3.7  --  2.6  HGB 12.8 12.9 13.1  HCT 40.7 40.7 41.9  MCV 100.2* 100.2* 100.0  PLT 280 288 622   Basic Metabolic Panel: Recent Labs  Lab 03/14/22 1201 03/15/22 0425 03/16/22 0409  NA 141 140 139  K 3.8 3.5 3.9  CL 106 103 101  CO2 29 31 32  GLUCOSE 99 113* 102*  BUN '15 13 18  '$ CREATININE 0.74 0.76 0.94  CALCIUM 8.1* 8.2* 8.4*  MG  --   --  2.3  PHOS  --   --  3.7   Liver Function Tests: Recent Labs  Lab 03/14/22 1201 03/15/22 0425 03/16/22 0409  AST '18 18 21  '$ ALT 36 32 24  ALKPHOS 46 48 49  BILITOT 0.5 0.3 0.9  PROT 6.4* 6.0* 6.1*  ALBUMIN 3.3* 2.9* 2.9*   CBG: Recent Labs  Lab 03/15/22 1141 03/15/22 1643 03/15/22 2113 03/16/22 0822 03/16/22 1301  GLUCAP 99 103* 122* 106* 94    Discharge time spent: greater than 30 minutes.  Signed: Raiford Noble, DO [facility] 03/16/2022

## 2022-03-16 NOTE — TOC Initial Note (Signed)
Transition of Care [facility]) - Initial/Assessment Note    Patient Details  Name: Sarah Cervantes MRN: [id number] Date of Birth: 1980/07/17  [facility]) CM/SW Contact:    Roseanne Kaufman, RN Phone Number: 03/16/2022, 12:38 PM  Clinical Narrative:    Jackquline [place] consult: for  SA, HF screen.   TOC will continue to follow.               Expected Discharge Plan: Home/Self Care Barriers to Discharge: No Barriers Identified   Patient Goals and CMS Choice Patient states their goals for this hospitalization and ongoing recovery are:: return home with friend   Choice offered to / list presented to : NA  Expected Discharge Plan and Services Expected Discharge Plan: Home/Self Care In-house Referral: NA Discharge Planning Services: CM Consult Post Acute Care Choice: NA Living arrangements for the past 2 months: Single Family Home Expected Discharge Date: 03/16/22               DME Arranged: N/A DME Agency: NA       HH Arranged: NA HH Agency: NA        Prior Living Arrangements/Services Living arrangements for the past 2 months: Single Family Home Lives with:: Self, Friends Patient language and need for interpreter reviewed:: Yes Do you feel safe going back to the place where you live?: Yes      Need for Family Participation in Patient Care: No (Comment) Care giver support system in place?: No (comment) Current home services: Other (comment) (none) Criminal Activity/Legal Involvement Pertinent to Current Situation/Hospitalization: No - Comment as needed  Activities of Daily Living Home Assistive Devices/Equipment: Oxygen ADL Screening (condition at time of admission) Patient's cognitive ability adequate to safely complete daily activities?: No Is the patient deaf or have difficulty hearing?: No Does the patient have difficulty seeing, even when wearing glasses/contacts?: No Does the patient have difficulty concentrating, remembering, or making decisions?:  No Patient able to express need for assistance with ADLs?: Yes Does the patient have difficulty dressing or bathing?: Yes Independently performs ADLs?: No Communication: Independent Does the patient have difficulty walking or climbing stairs?: Yes Weakness of Legs: Both Weakness of Arms/Hands: None  Permission Sought/Granted Permission sought to share information with : Case Manager Permission granted to share information with : Yes, Verbal Permission Granted  Share Information with NAME: Case manager           Emotional Assessment Appearance:: Appears stated age Attitude/Demeanor/Rapport: Engaged Affect (typically observed): Accepting Orientation: : Oriented to Self, Oriented to Place, Oriented to  Time, Oriented to Situation Alcohol / Substance Use: Illicit Drugs Psych Involvement: No (comment)  Admission diagnosis:  Hypoxia [R09.02] Near syncope [R55] Fall, initial encounter [W19.XXXA] Traumatic injury of head, initial encounter [S09.90XA] Compression fracture of T10 vertebra, initial encounter ([place]) [S22.070A] Acute respiratory failure with hypoxia ([place]) [J96.01] Patient Active Problem List   Diagnosis Date Noted   Acute respiratory failure with hypoxia ([place]) 03/16/2022   Near syncope 03/14/2022   Closed fracture of T10 vertebra ([place]) 03/14/2022   Substance induced mood disorder ([place]) 07/16/2021   Constipation 04/13/2018   Bacterial vaginosis 04/13/2018   Bipolar I disorder, current or most recent episode depressed, with psychotic features ([place]) 03/26/2018   Bacteremia due to Gram-negative bacteria 03/25/2018   Cocaine abuse with cocaine-induced mood disorder ([place]) 03/24/2018   PTSD (post-traumatic stress disorder) 03/24/2018   Sepsis, Gram negative ([place]) 03/23/2018   Alcohol abuse with intoxication ([place]) 03/23/2018   Polysubstance dependence including opioid  drug with daily use ([place]) 03/23/2018   PCP:  Patient, No Pcp Per Pharmacy:   [facility] [place] [address] Phone: [phone] Fax: [phone]     Social Determinants of Health (SDOH) Interventions    Readmission Risk Interventions     No data to display

## 2022-03-16 NOTE — Progress Notes (Signed)
Occupational Therapy Treatment Patient Details Name: Sarah Cervantes MRN: [id number] DOB: 06/03/1980 Today's Date: 03/16/2022   History of present illness Sarah Cervantes is a 41 y.o. female with PMH significant of multiple substance abuse, obesity, sleep apnea, anxiety, type 2 diabetes mellitus, mood disorder.  Patient presented to the hospital with complaints of a fall at the motel that she is living in.  Patient currently at [facility] and.  While coming out of the shower slipped and fell. Found to have Closed fracture of T10 vertebra  Age indeterminant. Patient admitted for possible CHF exacerbation and pain control.   OT comments  Treatment focused on monitoring patient's o2 sat to and ADL compensatory strategies. Patient able to perform toileting and standing grooming task without assistance. Patient instructed on use of AE for LB ADLs and discussed how to adapt equipment for toileting if needed. Patient verbalized understanding and demonstrated use of sock aide. Patient ambulated 60 feet and then 120 feet on RA without device and o2 sat down to 90% but recovered quickly. Patient eager to go home. Patient has met OT goals. RN notified of walking o2 sats.    Recommendations for follow up therapy are one component of a multi-disciplinary discharge planning process, led by the attending physician.  Recommendations may be updated based on patient status, additional functional criteria and insurance authorization.    Follow Up Recommendations  No OT follow up    Assistance Recommended at Discharge PRN  Patient can return home with the following  Assistance with cooking/housework;Help with stairs or ramp for entrance;Assist for transportation   Equipment Recommendations  Tub/shower seat    Recommendations for Other Services      Precautions / Restrictions Precautions Precautions: Back Precaution Comments: monitor sats, T10 compression fx Restrictions Weight Bearing Restrictions: No        Mobility Bed Mobility                    Transfers                         Balance Overall balance assessment: No apparent balance deficits (not formally assessed)                                         ADL either performed or assessed with clinical judgement   ADL Overall ADL's : Needs assistance/impaired                         Toilet Transfer: Supervision/safety   Toileting- Clothing Manipulation and Hygiene: Supervision/safety         General ADL Comments: Educated patient on use of AE (hip kit) for LB ADls and how to use existing equipment as toileting aide if needed - as patient reports that is her biggest difficulty. She also reports she can't don socks either. Patient issued Hip Kit. Patient ambulated 60 feet without device and o2 sat 94%. Patient ambulated 120 feet and o2 sat 90%. Patient reports no significant SHOB.    Extremity/Trunk Assessment Upper Extremity Assessment Upper Extremity Assessment: Overall WFL for tasks assessed   Lower Extremity Assessment Lower Extremity Assessment: Overall WFL for tasks assessed   Cervical / Trunk Assessment Cervical / Trunk Exceptions: body habitus    Vision       Perception     Praxis  Cognition Arousal/Alertness: Awake/alert Behavior During Therapy: WFL for tasks assessed/performed Overall Cognitive Status: Within Functional Limits for tasks assessed                                          Exercises      Shoulder Instructions       General Comments      Pertinent Vitals/ Pain       Pain Assessment Pain Assessment: 0-10 Pain Score: 8  Pain Location: Back Pain Descriptors / Indicators: Grimacing Pain Intervention(s): Monitored during session  Home Living                                          Prior Functioning/Environment              Frequency           Progress Toward Goals  OT  Goals(current goals can now be found in the care plan section)  Progress towards OT goals: Goals met/education completed, patient discharged from [place] All goals met and education completed, patient discharged from OT services    Co-evaluation                 AM-PAC OT "6 Clicks" Daily Activity     Outcome Measure   Help from another person eating meals?: None Help from another person taking care of personal grooming?: None Help from another person toileting, which includes using toliet, bedpan, or urinal?: None Help from another person bathing (including washing, rinsing, drying)?: None Help from another person to put on and taking off regular upper body clothing?: None Help from another person to put on and taking off regular lower body clothing?: None 6 Click Score: 24    End of Session Equipment Utilized During Treatment: Oxygen  OT Visit Diagnosis: Pain   Activity Tolerance Patient tolerated treatment well   Patient Left with call bell/phone within reach;in bed   Nurse Communication Other (comment) (o2 sats)        Time: 4696-2952 OT Time Calculation (min): 24 min  Charges: OT General Charges $OT Visit: 1 Visit OT Treatments $Self Care/Home Management : 23-37 mins  Gustavo Lah, OTR/L Acute Care Rehab Services  Office [phone]   Lenward Chancellor 03/16/2022, 2:04 PM

## 2022-03-16 NOTE — TOC Transition Note (Signed)
Transition of Care [facility]) - CM/SW Discharge Note   Patient Details  Name: Sarah Cervantes MRN: [id number] Date of Birth: [birth year]  Transition of Care [facility]) CM/SW Contact:  Roseanne Kaufman, RN Phone Number: 03/16/2022, 3:39 PM   Clinical Narrative:   RN notified this RNCM that patient left without her medications being delivered to room. This RNCM called [facility] to advise patient left before medications were delivered. No additional TOC needs.    Final next level of care: Home/Self Care Barriers to Discharge: No Barriers Identified   Patient Goals and CMS Choice Patient states their goals for this hospitalization and ongoing recovery are:: return home with friend   Choice offered to / list presented to : NA  Discharge Placement                       Discharge Plan and Services In-house Referral: NA Discharge Planning Services: CM Consult Post Acute Care Choice: NA          DME Arranged: N/A DME Agency: NA       HH Arranged: NA HH Agency: NA        Social Determinants of Health (SDOH) Interventions     Readmission Risk Interventions     No data to display

## 2022-03-16 NOTE — Progress Notes (Signed)
SATURATION QUALIFICATIONS: (This note is used to comply with regulatory documentation for home oxygen)  Patient Saturations on Room Air at Rest = 94%  Patient Saturations on Room Air while Ambulating = 90%  Patient Saturations on 0 Liters of oxygen while Ambulating = 90%  Please briefly explain why patient needs home oxygen:  Patient maintains adequate oxygen saturations of 90% on room air during ambulation.

## 2022-03-24 ENCOUNTER — Other Ambulatory Visit (HOSPITAL_COMMUNITY): Payer: Self-pay

## 2022-03-25 ENCOUNTER — Other Ambulatory Visit (HOSPITAL_COMMUNITY): Payer: Self-pay

## 2022-03-30 ENCOUNTER — Emergency Department (HOSPITAL_COMMUNITY)
Admission: EM | Admit: 2022-03-30 | Discharge: 2022-03-31 | Disposition: A | Discharge status: Other Acute Inpatient Hospital | Payer: 59 | Attending: Emergency Medicine | Admitting: Emergency Medicine

## 2022-03-30 ENCOUNTER — Emergency Department (HOSPITAL_COMMUNITY): Payer: 59

## 2022-03-30 ENCOUNTER — Other Ambulatory Visit: Payer: Self-pay

## 2022-03-30 ENCOUNTER — Encounter (HOSPITAL_COMMUNITY): Payer: Self-pay

## 2022-03-30 DIAGNOSIS — R0789 Other chest pain: Secondary | ICD-10-CM | POA: Insufficient documentation

## 2022-03-30 DIAGNOSIS — Z1152 Encounter for screening for COVID-19: Secondary | ICD-10-CM | POA: Insufficient documentation

## 2022-03-30 DIAGNOSIS — Z91040 Latex allergy status: Secondary | ICD-10-CM | POA: Insufficient documentation

## 2022-03-30 DIAGNOSIS — R45851 Suicidal ideations: Secondary | ICD-10-CM | POA: Diagnosis not present

## 2022-03-30 DIAGNOSIS — Z87891 Personal history of nicotine dependence: Secondary | ICD-10-CM | POA: Diagnosis not present

## 2022-03-30 LAB — RESP PANEL BY RT-PCR (FLU A&B, COVID) ARPGX2
Influenza A by PCR: NEGATIVE
Influenza B by PCR: NEGATIVE
SARS Coronavirus 2 by RT PCR: NEGATIVE

## 2022-03-30 LAB — CBC WITH DIFFERENTIAL/PLATELET
Abs Immature Granulocytes: 0 10*3/uL (ref 0.00–0.07)
Basophils Absolute: 0.1 10*3/uL (ref 0.0–0.1)
Basophils Relative: 1 %
Eosinophils Absolute: 0.1 10*3/uL (ref 0.0–0.5)
Eosinophils Relative: 2 %
HCT: 46.5 % — ABNORMAL HIGH (ref 36.0–46.0)
Hemoglobin: 14.3 g/dL (ref 12.0–15.0)
Lymphocytes Relative: 20 %
Lymphs Abs: 1.4 10*3/uL (ref 0.7–4.0)
MCH: 31.2 pg (ref 26.0–34.0)
MCHC: 30.8 g/dL (ref 30.0–36.0)
MCV: 101.3 fL — ABNORMAL HIGH (ref 80.0–100.0)
Monocytes Absolute: 0.4 10*3/uL (ref 0.1–1.0)
Monocytes Relative: 5 %
Neutro Abs: 5.1 10*3/uL (ref 1.7–7.7)
Neutrophils Relative %: 72 %
Platelets: 331 10*3/uL (ref 150–400)
RBC: 4.59 MIL/uL (ref 3.87–5.11)
RDW: 15 % (ref 11.5–15.5)
WBC: 7.1 10*3/uL (ref 4.0–10.5)
nRBC: 0 % (ref 0.0–0.2)
nRBC: 0 /100 WBC

## 2022-03-30 LAB — COMPREHENSIVE METABOLIC PANEL
ALT: 27 U/L (ref 0–44)
AST: 19 U/L (ref 15–41)
Albumin: 3.2 g/dL — ABNORMAL LOW (ref 3.5–5.0)
Alkaline Phosphatase: 56 U/L (ref 38–126)
Anion gap: 12 (ref 5–15)
BUN: 11 mg/dL (ref 6–20)
CO2: 25 mmol/L (ref 22–32)
Calcium: 8.9 mg/dL (ref 8.9–10.3)
Chloride: 107 mmol/L (ref 98–111)
Creatinine, Ser: 1.04 mg/dL — ABNORMAL HIGH (ref 0.44–1.00)
GFR, Estimated: >60 mL/min (ref >60)
Glucose, Bld: 95 mg/dL (ref 70–99)
Potassium: 3.9 mmol/L (ref 3.5–5.1)
Sodium: 144 mmol/L (ref 135–145)
Total Bilirubin: 0.3 mg/dL (ref 0.3–1.2)
Total Protein: 6.5 g/dL (ref 6.5–8.1)

## 2022-03-30 LAB — TROPONIN I (HIGH SENSITIVITY)
Troponin I (High Sensitivity): 5 ng/L (ref <18)
Troponin I (High Sensitivity): 5 ng/L (ref <18)

## 2022-03-30 LAB — ACETAMINOPHEN LEVEL: Acetaminophen (Tylenol), Serum: <10 ug/mL — ABNORMAL LOW (ref 10–30)

## 2022-03-30 LAB — ETHANOL: Alcohol, Ethyl (B): <10 mg/dL (ref <10)

## 2022-03-30 LAB — SALICYLATE LEVEL: Salicylate Lvl: <7 mg/dL — ABNORMAL LOW (ref 7.0–30.0)

## 2022-03-30 NOTE — ED Provider Notes (Addendum)
[facility] EMERGENCY DEPARTMENT Provider Note   CSN: [id number] Arrival date & time: 03/30/22  1737     History  Chief Complaint  Patient presents with   Ingestion   Suicidal    Sarah Cervantes is a 41 y.o. female.  41 year old female with past medical history of schizophrenia presents today for evaluation of chest pain following cocaine use.  Endorses SI and states "just want to die".  She has a plan to overdose on pills.  Denies HI.  States that she sees shadows.  Endorses sleep disturbances.  Chest pain is described as pressure in the center of her chest.  Appears patient endorsed shortness of breath in triage.  Currently denies this for me.  Patient is resting comfortably and laying flat.   The history is provided by the patient. No language interpreter was used.       Home Medications Prior to Admission medications   Medication Sig Start Date End Date Taking? Authorizing Provider  acetaminophen (TYLENOL) 325 MG tablet Take 2 tablets (650 mg total) by mouth every 6 (six) hours as needed for mild pain (or Fever >/= 101). 03/16/22   Sheikh, Georgina Quint Latif, DO  albuterol (VENTOLIN HFA) 108 (90 Base) MCG/ACT inhaler Inhale 2 puffs into the lungs every 6 (six) hours as needed for wheezing or shortness of breath.    [provider]  ARIPiprazole (ABILIFY) 5 MG tablet Take 1 tablet (5 mg total) by mouth daily. 04/23/18   Derrill Center, NP  docusate sodium (COLACE) 100 MG capsule Take 1 capsule (100 mg total) by mouth 2 (two) times daily. 03/16/22   Sheikh, Omair Latif, DO  FLUoxetine (PROZAC) 40 MG capsule Take 40 mg by mouth in the morning.    [provider]  furosemide (LASIX) 40 MG tablet Take 1 tablet (40 mg total) by mouth daily. 03/16/22 04/15/22  Raiford Noble Latif, DO  gabapentin (NEURONTIN) 400 MG capsule Take 1 capsule (400 mg total) by mouth 3 (three) times daily. 03/16/22   Raiford Noble Latif, DO  hydrocortisone (ANUSOL-HC) 2.5 %  rectal cream Place rectally 3 (three) times daily. Patient not taking: Reported on 07/15/2021 03/26/18   Charlynne Cousins, MD  hydrOXYzine (ATARAX/VISTARIL) 50 MG tablet Take 1 tablet (50 mg total) by mouth 3 (three) times daily as needed for anxiety. Patient not taking: Reported on 03/14/2022 04/22/18   Derrill Center, NP  ibuprofen (ADVIL) 200 MG tablet Take 600 mg by mouth every 6 (six) hours as needed for headache or mild pain.    [provider]  lidocaine (LIDODERM) 5 % Place 1 patch onto the skin daily. Remove & Discard patch within 12 hours or as directed by MD 03/16/22   Raiford Noble Latif, DO  lisinopril (ZESTRIL) 10 MG tablet Take 10 mg by mouth in the morning.    [provider]  methocarbamol (ROBAXIN) 500 MG tablet Take 1 tablet (500 mg total) by mouth every 8 (eight) hours as needed for muscle spasms. 03/16/22   Raiford Noble Latif, DO  nicotine polacrilex (NICORETTE) 2 MG gum Take 1 each (2 mg total) by mouth as needed for smoking cessation. Patient not taking: Reported on 03/14/2022 04/22/18   Derrill Center, NP  ondansetron (ZOFRAN) 4 MG tablet Take 1 tablet (4 mg total) by mouth every 6 (six) hours as needed for nausea. 03/16/22   Sheikh, Omair Latif, DO  OZEMPIC, 0.25 OR 0.5 MG/DOSE, 2 MG/1.5ML SOPN Inject 0.5 mg into the skin  every Monday.    [provider]  prazosin (MINIPRESS) 1 MG capsule Take 1 capsule (1 mg total) by mouth at bedtime. Patient not taking: Reported on 03/14/2022 04/03/18   Pennelope Bracken, MD  senna (SENOKOT) 8.6 MG TABS tablet Take 1 tablet (8.6 mg total) by mouth daily. 03/16/22   Sheikh, Omair Latif, DO  topiramate (TOPAMAX) 50 MG tablet Take 1 tablet (50 mg total) by mouth at bedtime. Patient not taking: Reported on 03/14/2022 04/03/18   Pennelope Bracken, MD  traZODone (DESYREL) 100 MG tablet Take 1 tablet (100 mg total) by mouth at bedtime and may repeat dose one time if needed. Patient not taking:  Reported on 03/14/2022 04/22/18   Derrill Center, NP      Allergies    Latex    Review of Systems   Review of Systems  Constitutional:  Negative for fever.  Respiratory:  Negative for shortness of breath.   Cardiovascular:  Positive for chest pain. Negative for leg swelling.  Gastrointestinal:  Negative for abdominal pain.  Psychiatric/Behavioral:  Positive for sleep disturbance and suicidal ideas.   All other systems reviewed and are negative.   Physical Exam Updated Vital Signs BP (!) 148/103 (BP Location: Left Arm)   Pulse 80   Temp 98.6 F (37 C) (Oral)   Resp (!) 22   Ht '5\' 4"'$  (1.626 m)   Wt (!) 163.3 kg   SpO2 93%   BMI 61.79 kg/m  Physical Exam Vitals and nursing note reviewed.  Constitutional:      General: She is not in acute distress.    Appearance: Normal appearance. She is not ill-appearing.  HENT:     Head: Normocephalic and atraumatic.     Nose: Nose normal.  Eyes:     General: No scleral icterus.    Extraocular Movements: Extraocular movements intact.     Conjunctiva/sclera: Conjunctivae normal.  Cardiovascular:     Rate and Rhythm: Normal rate and regular rhythm.     Pulses: Normal pulses.     Heart sounds: Normal heart sounds.  Pulmonary:     Effort: Pulmonary effort is normal. No respiratory distress.     Breath sounds: Normal breath sounds. No wheezing or rales.  Abdominal:     General: There is no distension.     Tenderness: There is no abdominal tenderness.  Musculoskeletal:        General: Normal range of motion.     Cervical back: Normal range of motion.  Skin:    General: Skin is warm and dry.  Neurological:     General: No focal deficit present.     Mental Status: She is alert. Mental status is at baseline.     ED Results / Procedures / Treatments   Labs (all labs ordered are listed, but only abnormal results are displayed) Labs Reviewed  COMPREHENSIVE METABOLIC PANEL - Abnormal; Notable for the following components:       Result Value   Creatinine, Ser 1.04 (*)    Albumin 3.2 (*)    All other components within normal limits  CBC WITH DIFFERENTIAL/PLATELET - Abnormal; Notable for the following components:   HCT 46.5 (*)    MCV 101.3 (*)    All other components within normal limits  ACETAMINOPHEN LEVEL - Abnormal; Notable for the following components:   Acetaminophen (Tylenol), Serum <10 (*)    All other components within normal limits  SALICYLATE LEVEL - Abnormal; Notable for the following components:  Salicylate Lvl <4.4 (*)    All other components within normal limits  RESP PANEL BY RT-PCR (FLU A&B, COVID) ARPGX2  ETHANOL  RAPID URINE DRUG SCREEN, HOSP PERFORMED  I-STAT BETA HCG BLOOD, ED ([facility], [facility], [facility] ONLY)  TROPONIN I (HIGH SENSITIVITY)  TROPONIN I (HIGH SENSITIVITY)    EKG None  Radiology DG Chest 1 View  Result Date: 03/30/2022 CLINICAL DATA:  Chest pain EXAM: CHEST  1 VIEW COMPARISON:  03/16/2022 FINDINGS: Cardiomegaly with mild central congestion. No focal airspace disease, pleural effusion, or pneumothorax. IMPRESSION: Cardiomegaly with mild central congestion. Electronically Signed   By: Donavan Foil M.D.   On: 03/30/2022 21:10    Procedures Procedures    Medications Ordered in ED Medications - No data to display  ED Course/ Medical Decision Making/ A&P Clinical Course as of 03/30/22 2123  Thu Mar 30, 2022  2042 DG Chest Portable 1 View [AA]    Clinical Course User Index [AA] Evlyn Courier, PA-C                           Medical Decision Making Amount and/or Complexity of Data Reviewed Radiology:  Decision-making details documented in ED Course.   41 year old female presents today for evaluation of suicidal ideation with plan.  She also complains of chest pain following cocaine use.  Does have history of schizophrenia.  CBC is without leukocytosis, or anemia.  CMP shows creatinine 1.04 otherwise unremarkable.  Acetaminophen, salicylate, ethanol levels within normal limits.   Troponin negative x2. EKG without acute ischemic changes.  Chest x-ray demonstrates cardiomegaly as well as central vascular congestion however no overt pulmonary edema or other acute cardiopulmonary process.  No evidence of ACS.  Patient is medically cleared for psychiatric evaluation.  TTS consult ordered.  IVC paperwork completed.   Final Clinical Impression(s) / ED Diagnoses Final diagnoses:  Suicidal ideation    Rx / DC Orders ED Discharge Orders     None         Evlyn Courier, PA-C 03/30/22 2202    Ezequiel Essex, MD 03/30/22 2216    Evlyn Courier, PA-C 03/30/22 2306    Ezequiel Essex, MD 03/31/22 [phone]

## 2022-03-30 NOTE — ED Notes (Signed)
Patient now reports SI with plan to take a bunch of pills.

## 2022-03-30 NOTE — ED Provider Triage Note (Addendum)
Emergency Medicine Provider Triage Evaluation Note  Sarah Cervantes , a 41 y.o. female  was evaluated in triage.  Pt brought by EMS complains of chest pain that started approximately 1 hour ago after using cocaine. She states it has been persistent and has not improved. She also has shortness of breath. History of polysubstance dependence, alcohol abuse, and previous MI. Denies palpitations, fever, syncope, lightheadedness, other drug use.   Patient began stating she "just wants to die" and "I have a plan to take a lot of pills to kill myself".  States she has been having SI for awhile, used more cocaine than usual today with the intent to hurt herself, and has visual hallucinations of shadows. States she has not slept in 6 days. Denies HI. Endorses history of schizophrenia.    Review of Systems  Positive: Chest pain, shortness of breath, SI Negative: See above  Physical Exam  BP (!) 148/103 (BP Location: Left Arm)   Pulse 80   Temp 98.6 F (37 C) (Oral)   Resp (!) 22   SpO2 93%  Gen:   Awake, no distress   Resp:  Normal effort, lungs clear MSK:   Moves extremities without difficulty  Other:  Appears to be nodding off, answering questions appropriately  Medical Decision Making  Medically screening exam initiated at 5:43 PM.  Appropriate orders placed.  [name] was informed that the remainder of the evaluation will be completed by another provider, this initial triage assessment does not replace that evaluation, and the importance of remaining in the ED until their evaluation is complete.     Theressa Stamps R, [place] 03/30/22 1756    Pat Kocher, [place] 03/30/22 1816

## 2022-03-30 NOTE — ED Triage Notes (Signed)
Patient reports chest pain after cocaine use.  Described as pressurer in center of chest.  '324mg'$  ASA given by EMS.

## 2022-03-31 ENCOUNTER — Other Ambulatory Visit: Payer: Self-pay | Admitting: Psychiatry

## 2022-03-31 ENCOUNTER — Encounter (HOSPITAL_COMMUNITY): Payer: Self-pay | Admitting: Psychiatry

## 2022-03-31 ENCOUNTER — Inpatient Hospital Stay (HOSPITAL_COMMUNITY)
Admission: AD | Admit: 2022-03-31 | Discharge: 2022-04-06 | DRG: 885 | Payer: 59 | Source: Intra-hospital | Attending: Psychiatry | Admitting: Psychiatry

## 2022-03-31 DIAGNOSIS — R451 Restlessness and agitation: Secondary | ICD-10-CM | POA: Diagnosis present

## 2022-03-31 DIAGNOSIS — R079 Chest pain, unspecified: Secondary | ICD-10-CM | POA: Diagnosis present

## 2022-03-31 DIAGNOSIS — Z79899 Other long term (current) drug therapy: Secondary | ICD-10-CM | POA: Diagnosis not present

## 2022-03-31 DIAGNOSIS — Z5986 Financial insecurity: Secondary | ICD-10-CM | POA: Diagnosis not present

## 2022-03-31 DIAGNOSIS — F1190 Opioid use, unspecified, uncomplicated: Secondary | ICD-10-CM | POA: Diagnosis present

## 2022-03-31 DIAGNOSIS — Z87891 Personal history of nicotine dependence: Secondary | ICD-10-CM | POA: Diagnosis not present

## 2022-03-31 DIAGNOSIS — F315 Bipolar disorder, current episode depressed, severe, with psychotic features: Principal | ICD-10-CM | POA: Diagnosis present

## 2022-03-31 DIAGNOSIS — F4310 Post-traumatic stress disorder, unspecified: Secondary | ICD-10-CM | POA: Diagnosis present

## 2022-03-31 DIAGNOSIS — F332 Major depressive disorder, recurrent severe without psychotic features: Principal | ICD-10-CM | POA: Diagnosis present

## 2022-03-31 DIAGNOSIS — F1910 Other psychoactive substance abuse, uncomplicated: Secondary | ICD-10-CM | POA: Insufficient documentation

## 2022-03-31 DIAGNOSIS — R45851 Suicidal ideations: Secondary | ICD-10-CM | POA: Diagnosis present

## 2022-03-31 DIAGNOSIS — E1165 Type 2 diabetes mellitus with hyperglycemia: Secondary | ICD-10-CM | POA: Diagnosis present

## 2022-03-31 DIAGNOSIS — F411 Generalized anxiety disorder: Secondary | ICD-10-CM | POA: Diagnosis present

## 2022-03-31 DIAGNOSIS — Z5900 Homelessness unspecified: Secondary | ICD-10-CM

## 2022-03-31 DIAGNOSIS — I10 Essential (primary) hypertension: Secondary | ICD-10-CM | POA: Diagnosis present

## 2022-03-31 DIAGNOSIS — R0789 Other chest pain: Secondary | ICD-10-CM | POA: Diagnosis not present

## 2022-03-31 DIAGNOSIS — G4700 Insomnia, unspecified: Secondary | ICD-10-CM | POA: Diagnosis present

## 2022-03-31 DIAGNOSIS — M25561 Pain in right knee: Secondary | ICD-10-CM

## 2022-03-31 DIAGNOSIS — F1490 Cocaine use, unspecified, uncomplicated: Secondary | ICD-10-CM | POA: Diagnosis present

## 2022-03-31 LAB — I-STAT BETA HCG BLOOD, ED (MC, WL, AP ONLY): I-stat hCG, quantitative: <5 m[IU]/mL (ref <5)

## 2022-03-31 LAB — RAPID URINE DRUG SCREEN, HOSP PERFORMED
Amphetamines: NOT DETECTED
Barbiturates: NOT DETECTED
Benzodiazepines: NOT DETECTED
Cocaine: POSITIVE — AB
Opiates: NOT DETECTED
Tetrahydrocannabinol: NOT DETECTED

## 2022-03-31 MED ORDER — TRAZODONE HCL 50 MG PO TABS
50.0000 mg | ORAL_TABLET | Freq: Every evening | ORAL | Status: DC | PRN
  Administered 2022-03-31: 50 mg via ORAL
  Filled 2022-03-31: qty 1

## 2022-03-31 MED ORDER — GABAPENTIN 300 MG PO CAPS
800.0000 mg | ORAL_CAPSULE | Freq: Three times a day (TID) | ORAL | Status: DC
  Administered 2022-03-31: 800 mg via ORAL
  Filled 2022-03-31: qty 2

## 2022-03-31 MED ORDER — ACETAMINOPHEN 325 MG PO TABS
650.0000 mg | ORAL_TABLET | Freq: Four times a day (QID) | ORAL | Status: DC | PRN
  Administered 2022-04-01 – 2022-04-05 (×6): 650 mg via ORAL
  Filled 2022-03-31 (×6): qty 2

## 2022-03-31 MED ORDER — ACETAMINOPHEN 325 MG PO TABS: 650.0000 mg | ORAL_TABLET | Freq: Four times a day (QID) | ORAL | Status: DC | PRN

## 2022-03-31 MED ORDER — TRAZODONE HCL 50 MG PO TABS: 100.0000 mg | ORAL_TABLET | Freq: Every day | ORAL | Status: DC

## 2022-03-31 MED ORDER — PRAZOSIN HCL 1 MG PO CAPS
1.0000 mg | ORAL_CAPSULE | Freq: Every day | ORAL | Status: DC
  Filled 2022-03-31: qty 1

## 2022-03-31 MED ORDER — METHOCARBAMOL 500 MG PO TABS: 500.0000 mg | ORAL_TABLET | Freq: Three times a day (TID) | ORAL | Status: DC | PRN

## 2022-03-31 MED ORDER — ALBUTEROL SULFATE HFA 108 (90 BASE) MCG/ACT IN AERS: 2.0000 | INHALATION_SPRAY | Freq: Four times a day (QID) | RESPIRATORY_TRACT | Status: DC | PRN

## 2022-03-31 MED ORDER — HYDROXYZINE HCL 25 MG PO TABS: 50.0000 mg | ORAL_TABLET | Freq: Every day | ORAL | Status: DC | PRN

## 2022-03-31 MED ORDER — MAGNESIUM HYDROXIDE 400 MG/5ML PO SUSP: 30.0000 mL | Freq: Every day | ORAL | Status: DC | PRN

## 2022-03-31 MED ORDER — LORAZEPAM 1 MG PO TABS: 1.0000 mg | ORAL_TABLET | ORAL | Status: DC | PRN

## 2022-03-31 MED ORDER — BUTALBITAL-APAP-CAFFEINE 50-325-40 MG PO TABS: 1.0000 | ORAL_TABLET | ORAL | Status: DC | PRN

## 2022-03-31 MED ORDER — GABAPENTIN 400 MG PO CAPS
800.0000 mg | ORAL_CAPSULE | Freq: Three times a day (TID) | ORAL | Status: DC
  Administered 2022-03-31 – 2022-04-06 (×18): 800 mg via ORAL
  Filled 2022-03-31 (×4): qty 2
  Filled 2022-03-31: qty 42
  Filled 2022-03-31 (×5): qty 2
  Filled 2022-03-31: qty 42
  Filled 2022-03-31 (×3): qty 2
  Filled 2022-03-31: qty 42
  Filled 2022-03-31 (×12): qty 2

## 2022-03-31 MED ORDER — FUROSEMIDE 20 MG PO TABS
40.0000 mg | ORAL_TABLET | Freq: Every day | ORAL | Status: DC
  Administered 2022-03-31: 40 mg via ORAL
  Filled 2022-03-31: qty 2

## 2022-03-31 MED ORDER — MOMETASONE FURO-FORMOTEROL FUM 100-5 MCG/ACT IN AERO
2.0000 | INHALATION_SPRAY | Freq: Two times a day (BID) | RESPIRATORY_TRACT | Status: DC
  Administered 2022-03-31: 2 via RESPIRATORY_TRACT
  Filled 2022-03-31: qty 8.8

## 2022-03-31 MED ORDER — ARIPIPRAZOLE 10 MG PO TABS
5.0000 mg | ORAL_TABLET | Freq: Every day | ORAL | Status: DC
  Administered 2022-03-31: 5 mg via ORAL
  Filled 2022-03-31: qty 1

## 2022-03-31 MED ORDER — LISINOPRIL 10 MG PO TABS
10.0000 mg | ORAL_TABLET | Freq: Every morning | ORAL | Status: DC
  Administered 2022-03-31: 10 mg via ORAL
  Filled 2022-03-31: qty 1

## 2022-03-31 MED ORDER — FLUOXETINE HCL 20 MG PO CAPS
40.0000 mg | ORAL_CAPSULE | Freq: Every morning | ORAL | Status: DC
  Administered 2022-03-31: 40 mg via ORAL
  Filled 2022-03-31: qty 2

## 2022-03-31 MED ORDER — AMLODIPINE BESYLATE 5 MG PO TABS
5.0000 mg | ORAL_TABLET | Freq: Every day | ORAL | Status: DC
  Administered 2022-03-31: 5 mg via ORAL
  Filled 2022-03-31: qty 1

## 2022-03-31 MED ORDER — OLANZAPINE 5 MG PO TBDP
5.0000 mg | ORAL_TABLET | Freq: Three times a day (TID) | ORAL | Status: DC | PRN
  Administered 2022-04-01: 5 mg via ORAL
  Filled 2022-03-31: qty 1

## 2022-03-31 MED ORDER — IBUPROFEN 400 MG PO TABS: 400.0000 mg | ORAL_TABLET | Freq: Three times a day (TID) | ORAL | Status: DC | PRN

## 2022-03-31 MED ORDER — ALUM & MAG HYDROXIDE-SIMETH 200-200-20 MG/5ML PO SUSP
30.0000 mL | ORAL | Status: DC | PRN
  Filled 2022-03-31: qty 30

## 2022-03-31 MED ORDER — SEMAGLUTIDE(0.25 OR 0.5MG/DOS) 2 MG/1.5ML ~~LOC~~ SOPN: 0.5000 mg | PEN_INJECTOR | SUBCUTANEOUS | Status: DC

## 2022-03-31 MED ORDER — ZIPRASIDONE MESYLATE 20 MG IM SOLR: 20.0000 mg | INTRAMUSCULAR | Status: DC | PRN

## 2022-03-31 MED ORDER — PRAZOSIN HCL 1 MG PO CAPS
1.0000 mg | ORAL_CAPSULE | Freq: Every day | ORAL | Status: DC
  Administered 2022-03-31 – 2022-04-05 (×6): 1 mg via ORAL
  Filled 2022-03-31: qty 7
  Filled 2022-03-31 (×9): qty 1

## 2022-03-31 MED ORDER — DOCUSATE SODIUM 100 MG PO CAPS
200.0000 mg | ORAL_CAPSULE | Freq: Every day | ORAL | Status: DC
  Administered 2022-03-31: 200 mg via ORAL
  Filled 2022-03-31: qty 2

## 2022-03-31 NOTE — ED Notes (Signed)
Review of IVC documentation on clipboard in orange zone:  Missing top sheet of Order.  Retrieved from shredder. Made copies, attached to former copies, filed top sheet of Order w/medical records.

## 2022-03-31 NOTE — ED Notes (Signed)
[facility] called, dispatched at this time.

## 2022-03-31 NOTE — ED Notes (Signed)
Up to the bathroom 

## 2022-03-31 NOTE — ED Provider Notes (Signed)
Will need home meds ordered once med reconciliation competed. Additional request ordered.    Tacy Learn, PA-C 03/31/22 0600    Fatima Blank, MD 03/31/22 [phone]

## 2022-03-31 NOTE — ED Provider Notes (Signed)
Emergency Medicine Observation Re-evaluation Note  Sarah Cervantes is a 42 y.o. female, seen on rounds today.  Pt initially presented to the ED for complaints of Ingestion and Suicidal Currently, the patient is asleep.  Pt came in yesterday c/o SI.  IVC done.  Physical Exam  BP (!) 141/89   Pulse 90   Temp 97.9 F (36.6 C) (Oral)   Resp (!) 22   Ht '5\' 4"'$  (1.626 m)   Wt (!) 163.3 kg   SpO2 92%   BMI 61.79 kg/m  Physical Exam General: asleep Cardiac: rr Lungs: clear Psych: asleep  ED Course / MDM  EKG:EKG Interpretation  Date/Time:  Thursday March 30 2022 17:41:00 EDT Ventricular Rate:  77 PR Interval:  176 QRS Duration: 86 QT Interval:  378 QTC Calculation: 427 R Axis:   -76 Text Interpretation: Normal sinus rhythm Left anterior fascicular block Cannot rule out Anterior infarct , age undetermined Abnormal ECG When compared with ECG of 14-Mar-2022 14:47, PREVIOUS ECG IS PRESENT No significant change was found Confirmed by Ezequiel Essex [id number]) on 03/30/2022 11:26:43 PM  I have reviewed the labs performed to date as well as medications administered while in observation.  Recent changes in the last 24 hours include none.  Plan  Current plan is for awaiting TTS consult.  Home meds ordered.    Isla Pence, MD 03/31/22 [phone]

## 2022-03-31 NOTE — Progress Notes (Signed)
Nurse received call from [facility] who stated they found a knife in pts area after discharge and wanted to alert staff.

## 2022-03-31 NOTE — Progress Notes (Signed)
Admission Note: Patient is a 41 year old admitted to the unit under IVC status from [facility] for suicidal ideation, depression and substance abuse.  Patient is alert and oriented to person and place.  Patient presents with anxious mood and affect.  Stated she's here due to homelessness and drug use.  Personal belongings and skin assessment completed.  Skin is dry and intact. No contraband found.  Patient oriented to the unit, staff and room.  Routine safety checks initiated.  Patient is safe on the unit.

## 2022-03-31 NOTE — Tx Team (Signed)
Initial Treatment Plan 03/31/2022 6:16 PM Sarah Cervantes [id number]    PATIENT STRESSORS: Financial difficulties   Health problems   Substance abuse     PATIENT STRENGTHS: Communication skills  Motivation for treatment/growth    PATIENT IDENTIFIED PROBLEMS: "To love self"  "Live drug-free life"  Suicidal ideation  Depression  Substance abuse  Homelessness           DISCHARGE CRITERIA:  Motivation to continue treatment in a less acute level of care Safe-care adequate arrangements made  PRELIMINARY DISCHARGE PLAN: Attend aftercare/continuing care group Outpatient therapy Placement in alternative living arrangements  PATIENT/FAMILY INVOLVEMENT: This treatment plan has been presented to and reviewed with the patient, Sarah Cervantes, and/or family member.  The patient and family have been given the opportunity to ask questions and make suggestions.  Vela Prose, RN 03/31/2022, 6:16 PM

## 2022-03-31 NOTE — ED Notes (Signed)
Nurse caring for pt took IVC paperwork from this secretary at this time.

## 2022-03-31 NOTE — ED Notes (Signed)
IVC PAPERS COMPLETED

## 2022-03-31 NOTE — ED Notes (Signed)
Attempted to get I-stat beta hCG. Pt refusing d/t recent stick.

## 2022-03-31 NOTE — ED Notes (Signed)
Patient IVC clipboard delivered to [facility].

## 2022-03-31 NOTE — ED Notes (Addendum)
IVC paperwork located in [place]. Patient IVC'd on 03/31/2022, expires 04/07/22 (earlier morning hours).

## 2022-03-31 NOTE — Consult Note (Signed)
[place] [facility] [facility] Psychiatry Consult   Reason for Consult:  Suicidal Ideation  Referring Physician:  EPD Patient Identification: Sarah Cervantes MRN:  [id number] Principal Diagnosis: <principal problem not specified> Diagnosis:  Active Problems:   * No active hospital problems. *   Total Time spent with patient: 15 minutes  Subjective:   Sarah Cervantes is a 41 y.o. female seen and evaluated face-to-face by this provider.  Sarah Cervantes continues to endorse suicidal ideations with a plan to overdose.  States "I just do not want to live anymore".  States her depression began to worsen about 2 weeks ago.  States she did not want to disclose her current stressors.  States " it is too much I just do not want to live."  She reports she was followed by therapy and psychiatry in the past however has not been compliant with medications.  States using cocaine to mask her symptoms.  Reports she was followed by [facility]  Reports reports she was managing her medications.  States she is currently prescribed Abilify, Prozac and gabapentin.  She reports a history of bipolar disorder, posttraumatic stress disorder, major depressive disorder and schizoaffective disorder.  Reports previous suicide attempts.  Reports previous inpatient admissions.  Will recommend inpatient admission once medically cleared.  Case staffed with attending psychiatrist MD Dwyane Dee.  Support ,encouragement and reassurance was provided  HPI:  per admission assessment note: "41 year old female with past medical history of schizophrenia presents today for evaluation of chest pain following cocaine use.  Endorses SI and states "just want to die".  She has a plan to overdose on pills.  Denies HI.  States that she sees shadows.  Endorses sleep disturbances.  Chest pain is described as pressure in the center of her chest.  Appears patient endorsed shortness of breath in triage.  Currently denies this for me.  Patient is resting  comfortably and laying flat."  Past Psychiatric History: Reported history with bipolar disorder, schizoaffective disorder, posttraumatic stress disorder, and major depressive disorder.  Reports previous suicide attempts.  Denies that she is currently followed by psychiatry and/or therapy.  Risk to Self:   Risk to Others:   Prior Inpatient Therapy:   Prior Outpatient Therapy:    Past Medical History:  Past Medical History:  Diagnosis Date   Cocaine abuse, daily use ([place]) 03/23/2018   ETOH abuse 03/23/2018   Heroin abuse ([place]) 03/23/2018   History reviewed. No pertinent surgical history. Family History: History reviewed. No pertinent family history. Family Psychiatric  History:  Social History:  Social History   Substance and Sexual Activity  Alcohol Use Yes   Alcohol/week: 12.0 - 15.0 standard drinks of alcohol   Types: 12 - 15 Cans of beer per week   Comment: drinking daily for the past week     Social History   Substance and Sexual Activity  Drug Use Yes   Types: Cocaine    Social History   Socioeconomic History   Marital status: Single    Spouse name: Not on file   Number of children: Not on file   Years of education: Not on file   Highest education level: Not on file  Occupational History   Not on file  Tobacco Use   Smoking status: Former    Packs/day: 0.50    Years: 30.00    Total pack years: 15.00    Types: Cigarettes    Quit date: 01/12/2022    Years since quitting: 0.2   Smokeless tobacco: Never  Vaping Use   Vaping Use: Never used  Substance and Sexual Activity   Alcohol use: Yes    Alcohol/week: 12.0 - 15.0 standard drinks of alcohol    Types: 12 - 15 Cans of beer per week    Comment: drinking daily for the past week   Drug use: Yes    Types: Cocaine   Sexual activity: Yes    Birth control/protection: None  Other Topics Concern   Not on file  Social History Narrative   Not on file   Social Determinants of Health   Financial Resource  Strain: Not on file  Food Insecurity: Food Insecurity Present (03/15/2022)   Hunger Vital Sign    Worried About Running Out of Food in the Last Year: Often true    Ran Out of Food in the Last Year: Often true  Transportation Needs: Unmet Transportation Needs (03/15/2022)   PRAPARE - Hydrologist (Medical): Yes    Lack of Transportation (Non-Medical): Yes  Physical Activity: Not on file  Stress: Not on file  Social Connections: Not on file   Additional Social History:    Allergies:   Allergies  Allergen Reactions   Latex Swelling, Rash and Other (See Comments)    Hands swell    Labs:  Results for orders placed or performed during the hospital encounter of 03/30/22 (from the past 48 hour(s))  Resp Panel by RT-PCR (Flu A&B, Covid) Anterior Nasal Swab     Status: None   Collection Time: 03/30/22  6:07 PM   Specimen: Anterior Nasal Swab  Result Value Ref Range   SARS Coronavirus 2 by RT PCR NEGATIVE NEGATIVE    Comment: (NOTE) SARS-CoV-2 target nucleic acids are NOT DETECTED.  The SARS-CoV-2 RNA is generally detectable in upper respiratory specimens during the acute phase of infection. The lowest concentration of SARS-CoV-2 viral copies this assay can detect is 138 copies/mL. A negative result does not preclude SARS-Cov-2 infection and should not be used as the sole basis for treatment or other patient management decisions. A negative result may occur with  improper specimen collection/handling, submission of specimen other than nasopharyngeal swab, presence of viral mutation(s) within the areas targeted by this assay, and inadequate number of viral copies(<138 copies/mL). A negative result must be combined with clinical observations, patient history, and epidemiological information. The expected result is Negative.  Fact Sheet for Patients:  EntrepreneurPulse.com.au  Fact Sheet for Healthcare Providers:   IncredibleEmployment.be  This test is no t yet approved or cleared by the [place] FDA and  has been authorized for detection and/or diagnosis of SARS-CoV-2 by FDA under an Emergency Use Authorization (EUA). This EUA will remain  in effect (meaning this test can be used) for the duration of the COVID-19 declaration under Section 564(b)(1) of the Act, 21 U.S.C.section 360bbb-3(b)(1), unless the authorization is terminated  or revoked sooner.       Influenza A by PCR NEGATIVE NEGATIVE   Influenza B by PCR NEGATIVE NEGATIVE    Comment: (NOTE) The Xpert Xpress SARS-CoV-2/FLU/RSV plus assay is intended as an aid in the diagnosis of influenza from Nasopharyngeal swab specimens and should not be used as a sole basis for treatment. Nasal washings and aspirates are unacceptable for Xpert Xpress SARS-CoV-2/FLU/RSV testing.  Fact Sheet for Patients: EntrepreneurPulse.com.au  Fact Sheet for Healthcare Providers: IncredibleEmployment.be  This test is not yet approved or cleared by the [place] FDA and has been authorized for detection and/or diagnosis of SARS-CoV-2  by FDA under an Emergency Use Authorization (EUA). This EUA will remain in effect (meaning this test can be used) for the duration of the COVID-19 declaration under Section 564(b)(1) of the Act, 21 U.S.C. section 360bbb-3(b)(1), unless the authorization is terminated or revoked.  Performed at [facility], [place] [address] [place], [place] [address]   Troponin I (High Sensitivity)     Status: None   Collection Time: 03/30/22  6:10 PM  Result Value Ref Range   Troponin I (High Sensitivity) 5 <18 ng/L    Comment: (NOTE) Elevated high sensitivity troponin I (hsTnI) values and significant  changes across serial measurements may suggest ACS but many other  chronic and acute conditions are known to elevate hsTnI results.  Refer to the "Links" section for chest  pain algorithms and additional  guidance. Performed at [facility], [place] [address], [place], [place] [address]   Comprehensive metabolic panel     Status: Abnormal   Collection Time: 03/30/22  6:10 PM  Result Value Ref Range   Sodium 144 135 - 145 mmol/L   Potassium 3.9 3.5 - 5.1 mmol/L   Chloride 107 98 - 111 mmol/L   CO2 25 22 - 32 mmol/L   Glucose, Bld 95 70 - 99 mg/dL    Comment: Glucose reference range applies only to samples taken after fasting for at least 8 hours.   BUN 11 6 - 20 mg/dL   Creatinine, Ser 1.04 (H) 0.44 - 1.00 mg/dL   Calcium 8.9 8.9 - 10.3 mg/dL   Total Protein 6.5 6.5 - 8.1 g/dL   Albumin 3.2 (L) 3.5 - 5.0 g/dL   AST 19 15 - 41 U/L   ALT 27 0 - 44 U/L   Alkaline Phosphatase 56 38 - 126 U/L   Total Bilirubin 0.3 0.3 - 1.2 mg/dL   GFR, Estimated >60 >60 mL/min    Comment: (NOTE) Calculated using the CKD-EPI Creatinine Equation (2021)    Anion gap 12 5 - 15    Comment: Performed at [facility], [place] [address], [place], [place] [address]  Ethanol     Status: None   Collection Time: 03/30/22  6:10 PM  Result Value Ref Range   Alcohol, Ethyl (B) <10 <10 mg/dL    Comment: (NOTE) Lowest detectable limit for serum alcohol is 10 mg/dL.  For medical purposes only. Performed at [facility], [place] [address], [place], [place] [address]   CBC with Diff     Status: Abnormal   Collection Time: 03/30/22  6:10 PM  Result Value Ref Range   WBC 7.1 4.0 - 10.5 K/uL   RBC 4.59 3.87 - 5.11 MIL/uL   Hemoglobin 14.3 12.0 - 15.0 g/dL   HCT 46.5 (H) 36.0 - 46.0 %   MCV 101.3 (H) 80.0 - 100.0 fL   MCH 31.2 26.0 - 34.0 pg   MCHC 30.8 30.0 - 36.0 g/dL   RDW 15.0 11.5 - 15.5 %   Platelets 331 150 - 400 K/uL   nRBC 0.0 0.0 - 0.2 %   Neutrophils Relative % 72 %   Neutro Abs 5.1 1.7 - 7.7 K/uL   Lymphocytes Relative 20 %   Lymphs Abs 1.4 0.7 - 4.0 K/uL   Monocytes Relative 5 %   Monocytes Absolute 0.4 0.1 - 1.0 K/uL   Eosinophils Relative 2 %    Eosinophils Absolute 0.1 0.0 - 0.5 K/uL   Basophils Relative 1 %  Basophils Absolute 0.1 0.0 - 0.1 K/uL   nRBC 0 0 /100 WBC   Abs Immature Granulocytes 0.00 0.00 - 0.07 K/uL    Comment: Performed at [facility], [place] [address], [place], [place] [address]  Acetaminophen level     Status: Abnormal   Collection Time: 03/30/22  6:10 PM  Result Value Ref Range   Acetaminophen (Tylenol), Serum <10 (L) 10 - 30 ug/mL    Comment: (NOTE) Therapeutic concentrations vary significantly. A range of 10-30 ug/mL  may be an effective concentration for many patients. However, some  are best treated at concentrations outside of this range. Acetaminophen concentrations >150 ug/mL at 4 hours after ingestion  and >50 ug/mL at 12 hours after ingestion are often associated with  toxic reactions.  Performed at [facility], [place] [address], [place], [place] [address]   Salicylate level     Status: Abnormal   Collection Time: 03/30/22  6:10 PM  Result Value Ref Range   Salicylate Lvl <1.0 (L) 7.0 - 30.0 mg/dL    Comment: Performed at [place] [address] [place], [place] [address]  Troponin I (High Sensitivity)     Status: None   Collection Time: 03/30/22  8:55 PM  Result Value Ref Range   Troponin I (High Sensitivity) 5 <18 ng/L    Comment: (NOTE) Elevated high sensitivity troponin I (hsTnI) values and significant  changes across serial measurements may suggest ACS but many other  chronic and acute conditions are known to elevate hsTnI results.  Refer to the "Links" section for chest pain algorithms and additional  guidance. Performed at [facility], [place] [address] [place], [place] [address]   I-Stat beta hCG blood, ED     Status: None   Collection Time: 03/31/22  6:32 AM  Result Value Ref Range   I-stat hCG, quantitative <5.0 <5 mIU/mL   Comment 3            Comment:   GEST. AGE      CONC.  (mIU/mL)   <=1 WEEK        5 - 50     2 WEEKS       50 - 500     3  WEEKS       100 - 10,000     4 WEEKS     1,000 - 30,000        FEMALE AND NON-PREGNANT FEMALE:     LESS THAN 5 mIU/mL     Current Facility-Administered Medications  Medication Dose Route Frequency Provider Last Rate Last Admin   acetaminophen (TYLENOL) tablet 650 mg  650 mg Oral Q6H PRN Isla Pence, MD       albuterol (VENTOLIN HFA) 108 (90 Base) MCG/ACT inhaler 2 puff  2 puff Inhalation Q6H PRN Isla Pence, MD       amLODipine (NORVASC) tablet 5 mg  5 mg Oral Daily Isla Pence, MD       ARIPiprazole (ABILIFY) tablet 5 mg  5 mg Oral Daily Isla Pence, MD       butalbital-acetaminophen-caffeine (FIORICET) 50-325-40 MG per tablet 1 tablet  1 tablet Oral Q4H PRN Isla Pence, MD       docusate sodium (COLACE) capsule 200 mg  200 mg Oral Daily Isla Pence, MD       FLUoxetine (PROZAC) capsule 40 mg  40 mg Oral q AM Isla Pence, MD       furosemide (LASIX)  tablet 40 mg  40 mg Oral Daily Isla Pence, MD       gabapentin (NEURONTIN) capsule 800 mg  800 mg Oral TID Isla Pence, MD       hydrOXYzine (ATARAX) tablet 50 mg  50 mg Oral Daily PRN Isla Pence, MD       ibuprofen (ADVIL) tablet 400 mg  400 mg Oral Q8H PRN Isla Pence, MD       lisinopril (ZESTRIL) tablet 10 mg  10 mg Oral q AM Isla Pence, MD       methocarbamol (ROBAXIN) tablet 500 mg  500 mg Oral Q8H PRN Isla Pence, MD       mometasone-formoterol (DULERA) 100-5 MCG/ACT inhaler 2 puff  2 puff Inhalation BID Isla Pence, MD       prazosin (MINIPRESS) capsule 1 mg  1 mg Oral QHS Isla Pence, MD       traZODone (DESYREL) tablet 100 mg  100 mg Oral QHS Isla Pence, MD       Current Outpatient Medications  Medication Sig Dispense Refill   acetaminophen (TYLENOL) 325 MG tablet Take 2 tablets (650 mg total) by mouth every 6 (six) hours as needed for mild pain (or Fever >/= 101). 20 tablet 0   albuterol (VENTOLIN HFA) 108 (90 Base) MCG/ACT inhaler Inhale 2 puffs into the lungs  every 6 (six) hours as needed for wheezing or shortness of breath.     amLODipine (NORVASC) 5 MG tablet Take 5 mg by mouth daily.     ARIPiprazole (ABILIFY) 5 MG tablet Take 1 tablet (5 mg total) by mouth daily. 30 tablet 0   budesonide-formoterol (SYMBICORT) 80-4.5 MCG/ACT inhaler 2 puffs daily.     butalbital-acetaminophen-caffeine (FIORICET) 50-325-40 MG tablet Take 1 tablet by mouth every 4 (four) hours as needed for headache.     docusate sodium (COLACE) 100 MG capsule Take 1 capsule (100 mg total) by mouth 2 (two) times daily. (Patient taking differently: Take 200 mg by mouth daily.) 10 capsule 0   FLUoxetine (PROZAC) 40 MG capsule Take 40 mg by mouth in the morning.     furosemide (LASIX) 40 MG tablet Take 1 tablet (40 mg total) by mouth daily. 30 tablet 0   gabapentin (NEURONTIN) 400 MG capsule Take 1 capsule (400 mg total) by mouth 3 (three) times daily. (Patient taking differently: Take 800 mg by mouth 3 (three) times daily.) 90 capsule 0   hydrocortisone (ANUSOL-HC) 2.5 % rectal cream Place rectally 3 (three) times daily. (Patient taking differently: Place 1 Application rectally daily as needed for hemorrhoids.) 30 g 0   hydrOXYzine (ATARAX/VISTARIL) 50 MG tablet Take 1 tablet (50 mg total) by mouth 3 (three) times daily as needed for anxiety. (Patient taking differently: Take 50 mg by mouth daily as needed for anxiety.) 30 tablet 0   ibuprofen (ADVIL) 200 MG tablet Take 400 mg by mouth every 8 (eight) hours as needed for headache or mild pain.     lidocaine (LIDODERM) 5 % Place 1 patch onto the skin daily. Remove & Discard patch within 12 hours or as directed by MD (Patient taking differently: Place 2 patches onto the skin daily. Remove & Discard patch within 12 hours or as directed by MD) 30 patch 0   lisinopril (ZESTRIL) 10 MG tablet Take 10 mg by mouth in the morning.     methocarbamol (ROBAXIN) 500 MG tablet Take 1 tablet (500 mg total) by mouth every 8 (eight) hours as needed for  muscle spasms.  20 tablet 0   ondansetron (ZOFRAN) 4 MG tablet Take 1 tablet (4 mg total) by mouth every 6 (six) hours as needed for nausea. (Patient taking differently: Take 4 mg by mouth every 6 (six) hours as needed for nausea. odt) 20 tablet 0   OZEMPIC, 0.25 OR 0.5 MG/DOSE, 2 MG/1.5ML SOPN Inject 0.5 mg into the skin every Monday.     prazosin (MINIPRESS) 1 MG capsule Take 1 capsule (1 mg total) by mouth at bedtime. 30 capsule 0   senna (SENOKOT) 8.6 MG TABS tablet Take 1 tablet (8.6 mg total) by mouth daily. 30 tablet 0   traZODone (DESYREL) 100 MG tablet Take 1 tablet (100 mg total) by mouth at bedtime and may repeat dose one time if needed. (Patient taking differently: Take 100 mg by mouth at bedtime.) 30 tablet 0   nicotine polacrilex (NICORETTE) 2 MG gum Take 1 each (2 mg total) by mouth as needed for smoking cessation. (Patient not taking: Reported on 03/14/2022) 100 tablet 0   topiramate (TOPAMAX) 50 MG tablet Take 1 tablet (50 mg total) by mouth at bedtime. (Patient not taking: Reported on 03/14/2022) 30 tablet 0    Musculoskeletal: Strength & Muscle Tone: within normal limits Gait & Station: normal Patient leans: N/A            Psychiatric Specialty Exam:  Presentation  General Appearance:  Appropriate for Environment; Casual  Eye Contact: Good  Speech: Clear and Coherent; Normal Rate  Speech Volume: Normal  Handedness: Right   Mood and Affect  Mood: Depressed; Worthless  Affect: Appropriate; Congruent; Depressed   Thought Process  Thought Processes: Coherent; Goal Directed  Descriptions of Associations:Intact  Orientation:Full (Time, Place and Person)  Thought Content:Logical  History of Schizophrenia/Schizoaffective disorder:No  Duration of Psychotic Symptoms:N/A  Hallucinations:No data recorded Ideas of Reference:None  Suicidal Thoughts:No data recorded Homicidal Thoughts:No data recorded  Sensorium  Memory: Immediate Good;  Recent Good; Remote Fair  Judgment: Fair (at baseline based on chronic cocaine abuse)  Insight: Fair   Community education officer  Concentration: Good  Attention Span: Good  Recall: Good  Fund of Knowledge: Good  Language: Good   Psychomotor Activity  Psychomotor Activity:No data recorded  Assets  Assets: Communication Skills; Desire for Improvement   Sleep  Sleep:No data recorded  Physical Exam: Physical Exam Vitals and nursing note reviewed.  Constitutional:      Appearance: She is obese.  Cardiovascular:     Rate and Rhythm: Normal rate.  Neurological:     Mental Status: She is alert and oriented to person, place, and time.  Psychiatric:        Mood and Affect: Mood normal.        Behavior: Behavior normal.        Thought Content: Thought content normal.    Review of Systems  Psychiatric/Behavioral:  Positive for depression and suicidal ideas. The patient is nervous/anxious.   All other systems reviewed and are negative.  Blood pressure (!) 141/89, pulse 90, temperature 97.9 F (36.6 C), temperature source Oral, resp. rate (!) 22, height '5\' 4"'$  (1.626 m), weight (!) 163.3 kg, SpO2 92 %. Body mass index is 61.79 kg/m.   Disposition: Recommend psychiatric Inpatient admission when medically cleared. -CSW to follow-up with [facility] for inpatient admission And/or faxed patient's information to area inpatient facilities  Derrill Center, NP 03/31/2022 9:51 AM

## 2022-04-01 DIAGNOSIS — F1910 Other psychoactive substance abuse, uncomplicated: Secondary | ICD-10-CM | POA: Insufficient documentation

## 2022-04-01 DIAGNOSIS — F315 Bipolar disorder, current episode depressed, severe, with psychotic features: Principal | ICD-10-CM

## 2022-04-01 MED ORDER — ALBUTEROL SULFATE HFA 108 (90 BASE) MCG/ACT IN AERS: 2.0000 | INHALATION_SPRAY | Freq: Four times a day (QID) | RESPIRATORY_TRACT | Status: DC | PRN

## 2022-04-01 MED ORDER — DOCUSATE SODIUM 100 MG PO CAPS
200.0000 mg | ORAL_CAPSULE | Freq: Every day | ORAL | Status: DC
  Administered 2022-04-01 – 2022-04-06 (×6): 200 mg via ORAL
  Filled 2022-04-01 (×4): qty 2
  Filled 2022-04-01: qty 14
  Filled 2022-04-01 (×5): qty 2

## 2022-04-01 MED ORDER — HYDROXYZINE HCL 50 MG PO TABS
50.0000 mg | ORAL_TABLET | Freq: Three times a day (TID) | ORAL | Status: DC | PRN
  Administered 2022-04-02: 50 mg via ORAL
  Filled 2022-04-01: qty 10
  Filled 2022-04-01: qty 1

## 2022-04-01 MED ORDER — SEMAGLUTIDE(0.25 OR 0.5MG/DOS) 2 MG/1.5ML ~~LOC~~ SOPN: 0.5000 mg | PEN_INJECTOR | SUBCUTANEOUS | Status: DC

## 2022-04-01 MED ORDER — ARIPIPRAZOLE 5 MG PO TABS
5.0000 mg | ORAL_TABLET | Freq: Every day | ORAL | Status: DC
  Administered 2022-04-01 – 2022-04-02 (×2): 5 mg via ORAL
  Filled 2022-04-01 (×5): qty 1

## 2022-04-01 MED ORDER — AMLODIPINE BESYLATE 5 MG PO TABS
5.0000 mg | ORAL_TABLET | Freq: Every day | ORAL | Status: DC
  Administered 2022-04-01 – 2022-04-06 (×6): 5 mg via ORAL
  Filled 2022-04-01: qty 1
  Filled 2022-04-01: qty 7
  Filled 2022-04-01 (×7): qty 1

## 2022-04-01 MED ORDER — LISINOPRIL 10 MG PO TABS
10.0000 mg | ORAL_TABLET | Freq: Every morning | ORAL | Status: DC
  Administered 2022-04-04 – 2022-04-06 (×3): 10 mg via ORAL
  Filled 2022-04-01 (×3): qty 1
  Filled 2022-04-01: qty 7
  Filled 2022-04-01 (×3): qty 1

## 2022-04-01 MED ORDER — FUROSEMIDE 40 MG PO TABS
40.0000 mg | ORAL_TABLET | Freq: Every day | ORAL | Status: DC
  Administered 2022-04-02 – 2022-04-06 (×5): 40 mg via ORAL
  Filled 2022-04-01 (×2): qty 1
  Filled 2022-04-01: qty 7
  Filled 2022-04-01 (×5): qty 1

## 2022-04-01 MED ORDER — MOMETASONE FURO-FORMOTEROL FUM 100-5 MCG/ACT IN AERO
2.0000 | INHALATION_SPRAY | Freq: Two times a day (BID) | RESPIRATORY_TRACT | Status: DC
  Administered 2022-04-01 – 2022-04-05 (×9): 2 via RESPIRATORY_TRACT
  Filled 2022-04-01 (×2): qty 8.8

## 2022-04-01 MED ORDER — TRAZODONE HCL 100 MG PO TABS
100.0000 mg | ORAL_TABLET | Freq: Every day | ORAL | Status: DC
  Administered 2022-04-01 – 2022-04-05 (×5): 100 mg via ORAL
  Filled 2022-04-01: qty 1
  Filled 2022-04-01: qty 7
  Filled 2022-04-01 (×6): qty 1

## 2022-04-01 MED ORDER — NON FORMULARY: 80.0000 ug | Freq: Every day | Status: DC

## 2022-04-01 NOTE — Hospital Course (Signed)
Can't sit in bed Body odor  Needs CPAP  O2 is low   OD on cocaine  At window, slept off and on Suicidal, hate self, don't want to be in world  Take lasix, colace, abilify, lisinopril  Wants tylenol because hurts all over  Pain 8, hurt all over  ED: chest pain following cocaine use, SI "just want to die", plan to overdose on pills. Seeing shadows. Endorses sleep disturbances. Chest pain pressure in center of chest. CBC no leukocytosis, anemia. CMP Cr 1.04. Negative troponin, EKG unremarkable. CXR cardiomegaly. Continued suicidal ideation with plan to OD. Worsened depression 2 weeks ago.

## 2022-04-01 NOTE — [facility] Group Notes (Signed)
  Goals Group 04/01/22   Group Focus: affirmation, clarity of thought, and goals/reality orientation Treatment Modality:  Psychoeducation Interventions utilized were assignment, group exercise, and support Purpose: To be able to understand and verbalize the reason for their admission to the hospital. To understand that the medication helps with their chemical imbalance but they also need to work on their choices in life. To be challenged to develop a list of 30 positives about themselves. Also introduce the concept that "feelings" are not reality.  Participation Level:  did not attend  Sarah Cervantes

## 2022-04-01 NOTE — [facility] Counselor (Signed)
Clinical Social Work Note  CSW attempted to do Psychosocial Assessment with patient.  She refused to leave her bed, but stated it could be done in her room.  She answered only a few questions, was completely silent for others.  At one point she appeared to doze off.  CSW team will continue attempts in the next 24 hours.  Selmer Dominion, LCSW 04/01/2022, 2:12 PM

## 2022-04-01 NOTE — [facility] Suicide Risk Assessment (Signed)
Suicide Risk Assessment  Admission Assessment    [facility] Admission Suicide Risk Assessment   Nursing information obtained from:    Demographic factors:  Low socioeconomic status, Unemployed Current Mental Status:  Self-harm thoughts Loss Factors:  Financial problems / change in socioeconomic status, Decrease in vocational status Historical Factors:  Impulsivity Risk Reduction Factors:  NA  Total Time spent with patient: 45 minutes Principal Problem: Bipolar disorder, curr episode depressed, severe, w/psychotic features ([place]) Diagnosis:  Principal Problem:   Bipolar disorder, curr episode depressed, severe, w/psychotic features ([place]) Active Problems:   PTSD (post-traumatic stress disorder)   Polysubstance abuse ([place])  Subjective Data:   Sarah Cervantes is a 41 year old, Caucasian female with a past psychiatric history significant for PTSD, bipolar disorder, and generalized anxiety disorder who was involuntarily admitted to [facility] due to suicidal ideations with a plan.   Patient reports that she was admitted to [facility]  due to "Just want to die, I have nothing to live for anymore."  Patient reports that she has been feeling this way for 5 days.  Patient reports she had a plan to overdose on a number of pills that she is currently taking including lisinopril, Tylenol, ibuprofen, Abilify, Prozac, and gabapentin.  Patient reports that she attempted to overdose 2 weeks ago but it did not work.  She reports that she was going to try to overdose yesterday but stopped since she did not die the last time she overdosed.  Patient reports that she ended up calling 911 and was delivered to [facility] counselor assessment.  Patient states that her suicide attempt was triggered to life stressors.  Patient endorses life in general as a stressor and states that her family does not like her.  She reports that she is not allowed to be around her daughter.  Patient's current stressors include  unemployment, homelessness, and financial instability.   In addition to suicidal ideations, patient states that she has been dealing with depression for 35 years.  Patient states that her depression has been worsening over the past 10 years.  Patient denies any alleviating factors to her depression and states that she last took her medications correctly a month ago.  Patient endorses the following depressive symptoms: insomnia, hypersomnia, anhedonia, depressed mood, feelings of guilt/worthlessness, hopelessness, decreased energy, decreased concentration, self-isolation, increased appetite, weight gain, agitation, psychomotor retardation, and memory issues.  Patient also endorses anxiety she rates a 10 out of 10.  Patient endorses panic attacks and states that her last panic attack occurred the day before yesterday after trying to call her family.   Patient endorses a past history of manic episodes and states that she has been without sleep for the past 7 days due to being on crack cocaine.  Patient also states that she has been without sleep for 10 days due to her manic episodes.  Patient endorses the following symptoms: grandiosity, racing thoughts, financial extravagance, irritability, mood swings, and pressured speech.  Patient endorses auditory hallucinations characterized by hearing all kinds of things.  Patient endorses visual hallucinations characterized by seeing shadows.  Patient denies command type hallucinations.  Patient endorses paranoia characterized by feeling like people are out to get her.   Patient reports that she feels ill.  Patient continues to endorse depression characterized by hopelessness and worthlessness.  Patient endorses anxiety.  Patient endorses suicidal ideations with no specific plan in place.  Patient denies homicidal ideations.  Patient endorses auditory hallucinations stating that it is "too much right now."  Patient endorses paranoia and delusional thoughts.  Patient  endorses poor sleep and received roughly 4 hours of sleep the previous night.  Patient endorses good appetite.   Patient is casually dressed, somnolent, and lying in bed during the assessment.  Patient drifts in and out asleep during the assessment resulting in many of the questions needing to be repeated during the history portion of the assessment.  Patient is irritable, mostly cooperative, and engaged in conversation during the encounter.  Patient's eye contact is fleeting during the assessment.  Patient's speech is clear, coherent, and with normal rate.  Patient's thought process is fluid and organized.  Patient's thought content is logical.  Patient's mood is depressed and anxious with congruent affect.  Patient dorsums auditory hallucinations but does not appear to be responding to internal/external stimuli.  Continued Clinical Symptoms:   Patient was admitted to [facility] due to suicidal ideations with a plan to overdose.  Patient continues to express suicidal ideations.  Patient endorses worsening depression characterized by hopelessness and worthlessness.  Patient also endorses anxiety.  Patient is endorsing auditory hallucinations but is unable to characterize what she is hearing.  Patient has a history of substance abuse with a urine drug screen significant for the presence of cocaine.   The "Alcohol Use Disorders Identification Test", Guidelines for Use in Primary Care, Second Edition.  World Pharmacologist [facility]). Score between 0-7:  no or low risk or alcohol related problems. Score between 8-15:  moderate risk of alcohol related problems. Score between 16-19:  high risk of alcohol related problems. Score 20 or above:  warrants further diagnostic evaluation for alcohol dependence and treatment.   CLINICAL FACTORS:   Severe Anxiety and/or Agitation Panic Attacks Bipolar Disorder:   Depressive phase Alcohol/Substance Abuse/Dependencies Currently  Psychotic   Musculoskeletal: Strength & Muscle Tone: within normal limits Gait & Station: unsteady, patient is a fall risk Patient leans: N/A  Psychiatric Specialty Exam:  Presentation  General Appearance:  Appropriate for Environment; Casual  Eye Contact: Fleeting  Speech: Clear and Coherent; Normal Rate  Speech Volume: Normal  Handedness: Right   Mood and Affect  Mood: Depressed; Hopeless; Worthless; Irritable  Affect: Congruent; Depressed   Thought Process  Thought Processes: Coherent  Descriptions of Associations:Intact  Orientation:Full (Time, Place and Person)  Thought Content:Logical  History of Schizophrenia/Schizoaffective disorder:No  Duration of Psychotic Symptoms:N/A  Hallucinations:Hallucinations: None  Ideas of Reference:None  Suicidal Thoughts:Suicidal Thoughts: Yes, Passive SI Passive Intent and/or Plan: Without Intent; Without Plan  Homicidal Thoughts:Homicidal Thoughts: No   Sensorium  Memory: Immediate Fair; Recent Fair; Remote Fair  Judgment: Fair  Insight: Fair   Materials engineer: Fair  Attention Span: Fair  Recall: [employer] of Knowledge: Fair  Language: Good   Psychomotor Activity  Psychomotor Activity: Psychomotor Activity: Normal   Assets  Assets: Communication Skills; Desire for Improvement   Sleep  Sleep: Sleep: Poor    Physical Exam: Physical Exam Constitutional:      Appearance: She is obese.  HENT:     Head: Normocephalic and atraumatic.     Nose: Nose normal.     Mouth/Throat:     Mouth: Mucous membranes are moist.  Eyes:     Extraocular Movements: Extraocular movements intact.     Pupils: Pupils are equal, round, and reactive to light.  Cardiovascular:     Rate and Rhythm: Normal rate and regular rhythm.  Pulmonary:     Effort: Pulmonary effort is normal.  Breath sounds: Normal breath sounds.  Musculoskeletal:        General: Normal range of  motion.     Cervical back: Normal range of motion and neck supple.  Skin:    General: Skin is warm and dry.  Neurological:     General: No focal deficit present.     Mental Status: She is alert and oriented to person, place, and time.  Psychiatric:        Attention and Perception: Attention normal. She perceives auditory hallucinations. She does not perceive visual hallucinations.        Mood and Affect: Mood is anxious and depressed.        Speech: Speech normal.        Behavior: Behavior normal. Behavior is cooperative.        Thought Content: Thought content is paranoid. Thought content is not delusional. Thought content includes suicidal ideation. Thought content does not include homicidal ideation. Thought content does not include suicidal plan.        Cognition and Memory: Cognition and memory normal.        Judgment: Judgment is impulsive.    Review of Systems  Constitutional: Negative.   HENT: Negative.    Eyes: Negative.   Respiratory: Negative.    Cardiovascular: Negative.   Gastrointestinal: Negative.   Genitourinary: Negative.   Skin: Negative.   Neurological: Negative.   Psychiatric/Behavioral:  Positive for depression, hallucinations, substance abuse and suicidal ideas. The patient is nervous/anxious and has insomnia.    Blood pressure 132/60, pulse 79, temperature 97.9 F (36.6 C), temperature source Oral, resp. rate (!) 23, height '5\' 2"'$  (1.575 m), weight (!) 149.7 kg, SpO2 95 %. Body mass index is 60.36 kg/m.   COGNITIVE FEATURES THAT CONTRIBUTE TO RISK:  None    SUICIDE RISK:   Severe:  Frequent, intense, and enduring suicidal ideation, specific plan, no subjective intent, but some objective markers of intent (i.e., choice of lethal method), the method is accessible, some limited preparatory behavior, evidence of impaired self-control, severe dysphoria/symptomatology, multiple risk factors present, and few if any protective factors, particularly a lack of social  support.  PLAN OF CARE:   Plan:   Patient was admitted to [facility] due to suicidal ideations with a plan to overdose.  Patient continues to express suicidal ideations.  Patient endorses worsening depression characterized by hopelessness and worthlessness.  Patient also endorses anxiety.  Patient is endorsing auditory hallucinations but is unable to characterize what she is hearing.  Patient has a history of substance abuse with a urine drug screen significant for the presence of cocaine.  Patient has a history of Abilify use.  Patient to be placed back on her Abilify 5 mg daily for depression and mood stability with the plan to increase the dosage tomorrow.  Provider to consider placing patient on Prozac if patient continues to experience depression.  Patient's appropriate home medications to be ordered.   Safety and Monitoring: Voluntary admission to inpatient psychiatric unit for safety, stabilization and treatment Daily contact with patient to assess and evaluate symptoms and progress in treatment Patient's case to be discussed in multi-disciplinary team meeting Observation Level : q15 minute checks Vital signs: q12 hours Precautions: safety   Diagnosis:  Principal Problem:   Bipolar disorder, curr episode depressed, severe, w/psychotic features ([place]) Active Problems:   PTSD (post-traumatic stress disorder)   Polysubstance abuse ([place])   #Bipolar disorder, current episode depressed, severe, with psychotic features -Start Abilify 5 mg  daily for depressed mood and mood stability -Consider the addition of Prozac if patient is experiencing ongoing depression   Agitation protocol             -Olanzapine Zydis 5 mg every 8 hours as needed for agitation AND             -Lorazepam 1 mg as needed for anxiety and severe agitation AND             -Ziprasidone 20 mg injection as needed for agitation   #Insomnia -Start trazodone 100 mg at bedtime for insomnia -Start  prazosin 1 mg at bedtime   #Polysubstance abuse -Patient has a history of marijuana, cocaine, and heroin use. -Patient reports that she used 2 g of cocaine 3 days ago -Urine drug screen positive for cocaine   #Anxiety -Hydroxyzine 50 mg 3 times daily as needed for anxiety   As needed medications: -Patient to continue taking Tylenol 650 mg every 6 hours as needed for mild pain -Patient to continue taking Maalox/Mylanta 30 mL every 4 hours as needed for indigestion -Patient to continue taking Milk of Magnesia 30 mL as needed for mild constipation -Hydroxyzine 25 mg 3 times daily as needed for anxiety   Call cardiology to clear patient's EKG results The following labs to be ordered: Hemoglobin A1c, fasting lipid panel   Discharge Planning: Social work and case management to assist with discharge planning and identification of hospital follow-up needs prior to discharge Estimated LOS: 5-7 days Discharge Concerns: Need to establish a safety plan; Medication compliance and effectiveness Discharge Goals: Return home with outpatient referrals for mental health follow-up including medication management/psychotherapy   I certify that inpatient services furnished can reasonably be expected to improve the patient's condition.   Malachy Mood, PA 04/01/2022, 10:40 PM

## 2022-04-01 NOTE — Plan of Care (Signed)
Nurse discussed anxiety, depression and coping skills with patient.  

## 2022-04-01 NOTE — H&P (Signed)
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 management  Observation Level/Precautions:  15 minute checks  Laboratory: Labs to be independently reviewed on 04/01/2022  Psychotherapy: Unit group sessions  Medications:  See [facility]  Consultations: To be determined  Discharge Concerns: Safety, medication compliant, mood stability  Estimated LOS: 5 - 7 days  Other:  N/A   Physician Treatment Plan for Primary Diagnosis: Bipolar disorder, curr episode depressed, severe, w/psychotic features ([place]) Long Term Goal(s): Improvement in symptoms so as ready for discharge  Short Term Goals: Ability to identify changes in lifestyle to reduce recurrence of condition will improve, Ability to verbalize feelings will improve, Ability to disclose and discuss suicidal ideas, Ability to  demonstrate self-control will improve, Ability to identify and develop effective coping behaviors will improve, Ability to maintain clinical measurements within normal limits will improve, Compliance with prescribed medications will improve, and Ability to identify triggers associated with substance abuse/mental health issues will improve  Physician Treatment Plan for Secondary Diagnosis: Principal Problem:   Bipolar disorder, curr episode depressed, severe, w/psychotic features ([place]) Active Problems:   PTSD (post-traumatic stress disorder)   Polysubstance abuse ([place])  Long Term Goal(s): Improvement in symptoms so as ready for discharge  Short Term Goals: Ability to identify changes in lifestyle to reduce recurrence of condition will improve, Ability to verbalize feelings will improve, Ability to disclose and discuss suicidal ideas, Ability to demonstrate self-control will improve, Ability to identify and develop effective coping behaviors will improve, Ability to maintain clinical measurements within normal limits will improve, Compliance with prescribed medications will improve, and Ability to identify triggers associated with substance abuse/mental health issues will improve  Plan:  Patient was admitted to [facility] due to suicidal ideations with a plan to overdose.  Patient continues to express suicidal ideations.  Patient endorses worsening depression characterized by hopelessness and worthlessness.  Patient also endorses anxiety.  Patient is endorsing auditory hallucinations but is unable to characterize what she is hearing.  Patient has a history of substance abuse with a urine drug screen significant for the presence of cocaine.  Patient has a history of Abilify use.  Patient to be placed back on her Abilify 5 mg daily for depression and mood stability with the plan to increase the dosage tomorrow.  Provider to consider placing patient on Prozac if patient continues to  experience depression.  Patient's appropriate home medications to be ordered.  Safety and Monitoring: Voluntary admission to inpatient psychiatric unit for safety, stabilization and treatment Daily contact with patient to assess and evaluate symptoms and progress in treatment Patient's case to be discussed in multi-disciplinary team meeting Observation Level : q15 minute checks Vital signs: q12 hours Precautions: safety  Diagnosis:  Principal Problem:   Bipolar disorder, curr episode depressed, severe, w/psychotic features ([place]) Active Problems:   PTSD (post-traumatic stress disorder)  Polysubstance abuse ([place])  #Bipolar disorder, current episode depressed, severe, with psychotic features -Start Abilify 5 mg daily for depressed mood and mood stability -Consider the addition of Prozac if patient is experiencing ongoing depression  Agitation protocol  -Olanzapine Zydis 5 mg every 8 hours as needed for agitation AND  -Lorazepam 1 mg as needed for anxiety and severe agitation AND  -Ziprasidone 20 mg injection as needed for agitation  #Insomnia -Start trazodone 100 mg at bedtime for insomnia -Start prazosin 1 mg at bedtime  #Polysubstance abuse -Patient has a history of marijuana, cocaine, and heroin use. -Patient reports that she used 2 g of cocaine 3 days ago -Urine drug screen positive for cocaine  #Anxiety -Hydroxyzine 50 mg 3 times daily as needed for anxiety  As needed medications: -Patient to continue taking Tylenol 650 mg every 6 hours as needed for mild pain -Patient to continue taking Maalox/Mylanta 30 mL every 4 hours as needed for indigestion -Patient to continue taking Milk of Magnesia 30 mL as needed for mild constipation -Hydroxyzine 25 mg 3 times daily as needed for anxiety  Call cardiology to clear patient's EKG results The following labs to be ordered: Hemoglobin A1c, fasting lipid panel  Discharge Planning: Social work and case management to assist with  discharge planning and identification of hospital follow-up needs prior to discharge Estimated LOS: 5-7 days Discharge Concerns: Need to establish a safety plan; Medication compliance and effectiveness Discharge Goals: Return home with outpatient referrals for mental health follow-up including medication management/psychotherapy   I certify that inpatient services furnished can reasonably be expected to improve the patient's condition.    Malachy Mood, PA 11/4/20239:11 PM

## 2022-04-01 NOTE — Progress Notes (Signed)
Psychoeducational Group Note  Date:  04/01/2022 Time:  2015  Group Topic/Focus: Wrap up group  Participation Level: Did Not Attend  Participation Quality:  Not Applicable  Affect:  Not Applicable  Cognitive:  Not Applicable  Insight:  Not Applicable  Engagement in Group: Not Applicable  Additional Comments:  Did not attend.  Shellia Cleverly 04/01/2022, 8:54 PM

## 2022-04-01 NOTE — Progress Notes (Addendum)
D:  Patient's self inventory sheet, patient has fair sleep, no sleep medication.  Good appetite, low energy level, poor concentration.  Rated depression, hopeless and anxiety 10.  Withdrawals, tremors, diarrhea, chilling, cravings, cramping.  SI, contracts for safety verbal.  Physical problems, lightheaded, pain, dizzy.  Physical pain.  Wants to sleep today.  No plans.  No discharge plans. A:  Medications administered per MD orders.  Emotional support and encouragement given patient. R:  Denied HI.  SI, contracts for safety, no plan at [facility].  Denied A/V hallucinations.  Safety maintained with 15 minute checks.

## 2022-04-01 NOTE — Group Note (Signed)
[facility] Group Notes: (Clinical Social Work)   04/01/2022      Type of Therapy:  Group Therapy   Participation Level:  Did Not Attend - was invited both individually by MHT and by overhead announcement, chose not to attend.   Selmer Dominion, LCSW 04/01/2022, 1:42 PM

## 2022-04-01 NOTE — Progress Notes (Signed)
LAB CALLED;   Andre'S  TSH  RESULT  IS 0.598.

## 2022-04-01 NOTE — [facility] Group Notes (Signed)
.  Psychoeducational Group Note    Date:  114/2023 Time: 1300-1400    Purpose of Group: . The group focus' on teaching patients on how to identify their needs and their Life Skills:  A group where two lists are made. What people need and what are things that we do that are unhealthy. The lists are developed by the patients and it is explained that we often do the actions that are not healthy to get our list of needs met.  Goal:: to develop the coping skills needed to get their needs met  Participation Level:  did not atttend Sarah Cervantes

## 2022-04-01 NOTE — Progress Notes (Signed)
   04/01/22 2054  Psych Admission Type (Psych Patients Only)  Admission Status Involuntary  Psychosocial Assessment  Patient Complaints Anxiety  Eye Contact Brief  Facial Expression Anxious  Affect Appropriate to circumstance  Speech Logical/coherent  Interaction Cautious  Motor Activity Slow  Appearance/Hygiene Body odor;Disheveled  Behavior Characteristics Appropriate to situation  Mood Depressed  Thought Process  Coherency WDL  Content WDL  Delusions None reported or observed  Perception WDL  Hallucination None reported or observed  Judgment Poor  Confusion None  Danger to Self  Current suicidal ideation? Denies  Self-Injurious Behavior No self-injurious ideation or behavior indicators observed or expressed   Agreement Not to Harm Self Yes  Description of Agreement contracts  Danger to Others  Danger to Others None reported or observed

## 2022-04-02 DIAGNOSIS — F319 Bipolar disorder, unspecified: Secondary | ICD-10-CM | POA: Insufficient documentation

## 2022-04-02 LAB — LIPID PANEL
Cholesterol: 159 mg/dL (ref 0–200)
HDL: 37 mg/dL — ABNORMAL LOW (ref >40)
LDL Cholesterol: 79 mg/dL (ref 0–99)
Total CHOL/HDL Ratio: 4.3 RATIO
Triglycerides: 213 mg/dL — ABNORMAL HIGH (ref <150)
VLDL: 43 mg/dL — ABNORMAL HIGH (ref 0–40)

## 2022-04-02 LAB — HEMOGLOBIN A1C
Hgb A1c MFr Bld: 5.5 % (ref 4.8–5.6)
Mean Plasma Glucose: 111.15 mg/dL

## 2022-04-02 MED ORDER — MELATONIN 5 MG PO TABS
5.0000 mg | ORAL_TABLET | Freq: Every day | ORAL | Status: DC
  Administered 2022-04-02 – 2022-04-05 (×4): 5 mg via ORAL
  Filled 2022-04-02 (×3): qty 1
  Filled 2022-04-02: qty 7
  Filled 2022-04-02 (×3): qty 1

## 2022-04-02 MED ORDER — FLUOXETINE HCL 20 MG PO CAPS
20.0000 mg | ORAL_CAPSULE | Freq: Every day | ORAL | Status: DC
  Administered 2022-04-02: 20 mg via ORAL
  Filled 2022-04-02 (×4): qty 1

## 2022-04-02 MED ORDER — LISINOPRIL 5 MG PO TABS
10.0000 mg | ORAL_TABLET | Freq: Every morning | ORAL | Status: DC
  Administered 2022-04-03: 10 mg via ORAL
  Filled 2022-04-02 (×3): qty 2

## 2022-04-02 MED ORDER — ARIPIPRAZOLE 10 MG PO TABS
10.0000 mg | ORAL_TABLET | Freq: Every day | ORAL | Status: DC
  Administered 2022-04-03 – 2022-04-06 (×4): 10 mg via ORAL
  Filled 2022-04-02 (×4): qty 1
  Filled 2022-04-02: qty 7
  Filled 2022-04-02: qty 1

## 2022-04-02 NOTE — [facility] Group Notes (Signed)
Adult Psychoeducational Group Note Date:  04/02/2022 Time:  0900-1000 Group Topic/Focus: PROGRESSIVE RELAXATION. A group where deep breathing is taught and tensing and relaxation muscle groups is used. Imagery is used as well.  Pts are asked to imagine 3 pillars that hold them up when they are not able to hold themselves up and to share that with the group.   Participation Level:  Inactive  Participation Quality:  lacking  Affect:  flat  Cognitive:  unable to assess  Insight: unable to assess  Engagement in Group:  sat in the group  Modes of Intervention:  deep breathing, Imagery. Discussion  Additional Comments:  Rates her energy at a 0/10. Sat in the group but only stated her energy level.   : Paulino Rily

## 2022-04-02 NOTE — [facility] Suicide Risk Assessment (Signed)
[facility] INPATIENT:  Family/Significant Other Suicide Prevention Education  Suicide Prevention Education:  Education Completed;  [place] [phone],  (name of family member/significant other) has been identified by the patient as the family member who will aid the patient in the event of a mental health crisis (suicidal ideations/suicide attempt).    This is the aunt who has custody of patient's 43yo daughter.  CSW answered her questions and gave her SPE including Mobile Crisis telephone number.  The patient cannot come live there.  With written consent from the patient, the family member/significant other has been provided the following suicide prevention education, prior to the and/or following the discharge of the patient.  The suicide prevention education provided includes the following: Suicide risk factors Suicide prevention and interventions National Suicide Hotline telephone number [facility] assessment telephone number [facility] Emergency Assistance [place] and/or Residential Mobile Crisis Unit telephone number  Request made of family/significant other to: Remove weapons (e.g., guns, rifles, knives), all items previously/currently identified as safety concern.   Remove drugs/medications (over-the-counter, prescriptions, illicit drugs), all items previously/currently identified as a safety concern.  The family member/significant other verbalizes understanding of the suicide prevention education information provided.  The family member/significant other agrees to remove the items of safety concern listed above.  Berlin Hun Grossman-Orr 04/02/2022, 4:49 PM

## 2022-04-02 NOTE — Group Note (Signed)
[facility] LCSW Group Therapy Note  Date/Time:  04/02/2022  10:00AM-11:00AM  Type of Therapy and Topic:  Group Therapy:  Music's Effect on Depression and Anxiety  Participation Level:  Minimal   Description of Group: In this process group, members discussed what types of music trigger them to worsening mental health symptoms and what they can do about this in the future.  For instance, we discussed what to do when riding in a friend's car and a song harmful to the patient starts playing.  We also discussed how music can be used as a tool to help with wellness and recovery in various ways including managing depression and anxiety as well as encouraging healthy sleep habits and avoiding relapse into old negative behaviors such as drinking, self-harming, or staying in bed all day.  A variety of songs were played as examples.  Discussion was elicited which showed the group to be in agreement that the songs were quite relatable and have the potential to help them outside of the hospital setting.  Therapeutic Goals: Patients will be introduced to a variety of songs that can relate to self-image and self-love in a helpful way. Patients will explore the impact of different pieces of music on their feelings, i.e. uplifting, triggering, etc. Patients will discuss how to use this self-knowledge to assist in recovery.  Summary of Patient Progress:  At the beginning of group, patient expressed that music with a lot of cursing in it will upset her.  She mostly slept through group although she was seen to enjoy some of the songs.  Therapeutic Modalities: Solution Focused Brief Therapy Activity   Selmer Dominion, LCSW

## 2022-04-02 NOTE — Progress Notes (Signed)
[facility] MD Progress Note  04/02/2022 11:56 AM Sarah Cervantes  MRN:  [id number] Subjective:    Sarah Cervantes is a 41 year old, Caucasian female with a past psychiatric history significant for PTSD, bipolar disorder, polysubstance abuse, and generalized anxiety disorder who was involuntarily admitted to [facility] due to suicidal ideations with a plan.  Patient was assessed by Ileene Musa, PA reevaluation.  Patient is currently being managed on the following psychiatric medications:  Abilify 5 mg daily Trazodone 100 mg at bedtime Prazosin 1 mg at bedtime Hydroxyzine 50 mg 3 times daily as needed  Agitation protocol             -Olanzapine Zydis 5 mg every 8 hours as needed for agitation AND             -Lorazepam 1 mg as needed for anxiety and severe agitation AND             -Ziprasidone 20 mg injection as needed for agitation  Patient reports that she feels the same as she did before she was admitted to [facility].  Patient endorses severe depression and rates her depression a 10 out of 10 with 10 being the worst.  Patient endorses anxiety and rates her anxiety at 9 out of 10.  Patient continues to endorse everything as a stressor.  Patient requests that she be placed back on her Prozac.  Patient requests that her aunt Sarah Cervantes, [phone]) informed that she is currently admitted to [facility]  Patient endorses suicidal ideations with a plan to overdose.  She report being hopeless and worthless but is able to contract for her safety at this time.  Patient denies homicidal ideations.  She endorses visual hallucinations characterized by seeing shadows.  Patient denies auditory hallucinations and does not appear to be responding to internal/external stimuli.  Patient continues to endorse paranoia characterized by people out to get her.  Patient denies delusional thoughts.  Patient endorses poor sleep and states that she receives 4 hours of sleep last night.  Patient reports that she  finds melatonin more effective in managing her sleep.  Patient endorses good appetite.  Patient is casually dressed, alert and oriented x 4.  Patient is calm, cooperative, and engaged in conversation during the encounter.  Patient maintains good eye contact.  Patient's speech is low volume, clear, coherent, and with normal rate.  Patient's thought process is fluent and organized.  Patient's thought content is logical.  Patient exhibits depressed mood with congruent affect.  Patient does not appear to be responding to internal stimuli but expresses that she continues to see shadows.  Provider was able to reach out to patient's aunt Sarah Cervantes, [phone]) and informed her that patient was currently admitted to our facility.  Provider was unable to provide additional information regarding patient's course during her admission due to not being given permission by the patient.  Provider will obtain patient's permission on whether or not she wants her medical information to be relayed to her aunt.  Principal Problem: Bipolar disorder, curr episode depressed, severe, w/psychotic features ([place]) Diagnosis: Principal Problem:   Bipolar disorder, curr episode depressed, severe, w/psychotic features ([place]) Active Problems:   PTSD (post-traumatic stress disorder)   Polysubstance abuse ([place])  Total Time spent with patient: 15 minutes  Past Psychiatric History:  Bipolar disorder PTSD Polysubstance abuse  Past Medical History:  Past Medical History:  Diagnosis Date   Cocaine abuse, daily use ([place]) 03/23/2018   ETOH abuse 03/23/2018   Heroin abuse ([place]) 03/23/2018  History reviewed. No pertinent surgical history. Family History: History reviewed. No pertinent family history. Family Psychiatric  History:  Patient reports that most of her family members have some type of psychiatric illness but was unsure of what they specifically had   Social History:  Social History   Substance and Sexual Activity   Alcohol Use Yes   Alcohol/week: 12.0 - 15.0 standard drinks of alcohol   Types: 12 - 15 Cans of beer per week   Comment: drinking daily for the past week     Social History   Substance and Sexual Activity  Drug Use Yes   Types: Cocaine    Social History   Socioeconomic History   Marital status: Single    Spouse name: Not on file   Number of children: Not on file   Years of education: Not on file   Highest education level: Not on file  Occupational History   Not on file  Tobacco Use   Smoking status: Former    Packs/day: 0.50    Years: 30.00    Total pack years: 15.00    Types: Cigarettes    Quit date: 01/12/2022    Years since quitting: 0.2   Smokeless tobacco: Never  Vaping Use   Vaping Use: Never used  Substance and Sexual Activity   Alcohol use: Yes    Alcohol/week: 12.0 - 15.0 standard drinks of alcohol    Types: 12 - 15 Cans of beer per week    Comment: drinking daily for the past week   Drug use: Yes    Types: Cocaine   Sexual activity: Yes    Birth control/protection: None  Other Topics Concern   Not on file  Social History Narrative   Not on file   Social Determinants of Health   Financial Resource Strain: Not on file  Food Insecurity: Unknown (03/31/2022)   Hunger Vital Sign    Worried About Running Out of Food in the Last Year: Patient refused    Ran Out of Food in the Last Year: Patient refused  Recent Concern: [place] Present (03/15/2022)   Hunger Vital Sign    Worried About Running Out of Food in the Last Year: Often true    Ran Out of Food in the Last Year: Often true  Transportation Needs: Unknown (03/31/2022)   PRAPARE - Hydrologist (Medical): Patient refused    Lack of Transportation (Non-Medical): Patient refused  Recent Concern: Transportation Needs - Unmet Transportation Needs (03/15/2022)   PRAPARE - Hydrologist (Medical): Yes    Lack of  Transportation (Non-Medical): Yes  Physical Activity: Not on file  Stress: Not on file  Social Connections: Not on file   Additional Social History:   Sleep: Poor  Appetite:  Good  Current Medications: Current Facility-Administered Medications  Medication Dose Route Frequency Provider Last Rate Last Admin   acetaminophen (TYLENOL) tablet 650 mg  650 mg Oral Q6H PRN Leevy-Johnson, Brooke A, NP   650 mg at 04/02/22 0231   albuterol (VENTOLIN HFA) 108 (90 Base) MCG/ACT inhaler 2 puff  2 puff Inhalation Q6H PRN Samirah Scarpati E, PA       alum & mag hydroxide-simeth (MAALOX/MYLANTA) 200-200-20 MG/5ML suspension 30 mL  30 mL Oral Q4H PRN Leevy-Johnson, Brooke A, NP       amLODipine (NORVASC) tablet 5 mg  5 mg Oral Daily Aleyna Cueva E, PA   5 mg at  04/02/22 0843   ARIPiprazole (ABILIFY) tablet 5 mg  5 mg Oral Daily Sariah Henkin E, PA   5 mg at 04/02/22 0843   docusate sodium (COLACE) capsule 200 mg  200 mg Oral Daily Kim Oki E, PA   200 mg at 04/02/22 2831   furosemide (LASIX) tablet 40 mg  40 mg Oral Daily Blayde Bacigalupi E, PA   40 mg at 04/02/22 5176   gabapentin (NEURONTIN) capsule 800 mg  800 mg Oral TID Leevy-Johnson, Brooke A, NP   800 mg at 04/02/22 1607   hydrOXYzine (ATARAX) tablet 50 mg  50 mg Oral TID PRN Jamin Humphries E, PA       lisinopril (ZESTRIL) tablet 10 mg  10 mg Oral q AM Gustavo Meditz E, PA       lisinopril (ZESTRIL) tablet 10 mg  10 mg Oral q AM Massengill, Ovid Curd, MD       OLANZapine zydis (ZYPREXA) disintegrating tablet 5 mg  5 mg Oral Q8H PRN Leevy-Johnson, Brooke A, NP   5 mg at 04/01/22 0909   And   LORazepam (ATIVAN) tablet 1 mg  1 mg Oral PRN Leevy-Johnson, Brooke A, NP       And   ziprasidone (GEODON) injection 20 mg  20 mg Intramuscular PRN Leevy-Johnson, Brooke A, NP       magnesium hydroxide (MILK OF MAGNESIA) suspension 30 mL  30 mL Oral Daily PRN Leevy-Johnson, Brooke A, NP       mometasone-formoterol (DULERA) 100-5 MCG/ACT inhaler 2 puff  2  puff Inhalation BID Massengill, Ovid Curd, MD   2 puff at 04/02/22 0900   prazosin (MINIPRESS) capsule 1 mg  1 mg Oral QHS Leevy-Johnson, Brooke A, NP   1 mg at 04/01/22 2100   [START ON 04/03/2022] Semaglutide(0.25 or 0.'5MG'$ /DOS) SOPN 0.5 mg  0.5 mg Subcutaneous Q Mon Anthony Roland E, PA       traZODone (DESYREL) tablet 100 mg  100 mg Oral QHS Bryn Perkin E, PA   100 mg at 04/01/22 2100   traZODone (DESYREL) tablet 50 mg  50 mg Oral QHS PRN Leevy-Johnson, Blaine Hamper, NP   50 mg at 03/31/22 2129    Lab Results:  Results for orders placed or performed during the hospital encounter of 03/31/22 (from the past 48 hour(s))  Lipid panel     Status: Abnormal   Collection Time: 04/02/22  6:33 AM  Result Value Ref Range   Cholesterol 159 0 - 200 mg/dL   Triglycerides 213 (H) <150 mg/dL   HDL 37 (L) >40 mg/dL   Total CHOL/HDL Ratio 4.3 RATIO   VLDL 43 (H) 0 - 40 mg/dL   LDL Cholesterol 79 0 - 99 mg/dL    Comment:        Total Cholesterol/HDL:CHD Risk Coronary Heart Disease Risk Table                     Men   Women  1/2 Average Risk   3.4   3.3  Average Risk       5.0   4.4  2 X Average Risk   9.6   7.1  3 X Average Risk  23.4   11.0        Use the calculated Patient Ratio above and the CHD Risk Table to determine the patient's CHD Risk.        ATP III CLASSIFICATION (LDL):  <100     mg/dL   Optimal  100-129  mg/dL   Near or Above                    Optimal  130-159  mg/dL   Borderline  160-189  mg/dL   High  >190     mg/dL   Very High Performed at [facility] [address] [place], [place] [address]     Blood Alcohol level:  Lab Results  Component Value Date   [facility] <10 03/30/2022   ETH <10 54/01/8118    Metabolic Disorder Labs: Lab Results  Component Value Date   HGBA1C 5.9 (H) 03/15/2022   MPG 122.63 03/15/2022   MPG 114.02 03/24/2018   No results found for: "PROLACTIN" Lab Results  Component Value Date   CHOL 159 04/02/2022   TRIG 213 (H)  04/02/2022   HDL 37 (L) 04/02/2022   CHOLHDL 4.3 04/02/2022   VLDL 43 (H) 04/02/2022   LDLCALC 79 04/02/2022    Physical Findings: AIMS:  , ,  ,  ,    CIWA:    COWS:     Musculoskeletal: Strength & Muscle Tone: within normal limits Gait & Station: unsteady, patient is a high fall risk Patient leans: N/A  Psychiatric Specialty Exam:  Presentation  General Appearance:  Appropriate for Environment; Casual  Eye Contact: Fair  Speech: Clear and Coherent; Normal Rate  Speech Volume: Decreased  Handedness: Right   Mood and Affect  Mood: Depressed; Hopeless; Worthless; Anxious  Affect: Depressed; Congruent   Thought Process  Thought Processes: Coherent  Descriptions of Associations:Intact  Orientation:Full (Time, Place and Person)  Thought Content:Logical  History of Schizophrenia/Schizoaffective disorder:No  Duration of Psychotic Symptoms:N/A  Hallucinations:Hallucinations: Visual Description of Visual Hallucinations: Patient reports that they are seeing shadows  Ideas of Reference:None  Suicidal Thoughts:Suicidal Thoughts: Yes, Active SI Active Intent and/or Plan: With Intent; With Plan; With Means to Carry Out; Without Access to Means SI Passive Intent and/or Plan: Without Intent; Without Plan  Homicidal Thoughts:Homicidal Thoughts: No   Sensorium  Memory: Immediate Good; Recent Fair; Remote Fair  Judgment: Fair  Insight: Fair   Executive Functions  Concentration: Good  Attention Span: Good  Recall: [place] of Knowledge: Fair  Language: Good   Psychomotor Activity  Psychomotor Activity: Psychomotor Activity: Normal   Assets  Assets: Communication Skills; Desire for Improvement   Sleep  Sleep: Sleep: Fair    Physical Exam: Physical Exam Constitutional:      Appearance: Normal appearance.  HENT:     Head: Normocephalic and atraumatic.     Nose: Nose normal.  Eyes:     Extraocular Movements:  Extraocular movements intact.     Pupils: Pupils are equal, round, and reactive to light.  Cardiovascular:     Rate and Rhythm: Normal rate and regular rhythm.  Pulmonary:     Effort: Pulmonary effort is normal.     Breath sounds: Normal breath sounds.  Abdominal:     General: Abdomen is flat.  Musculoskeletal:        General: Normal range of motion.     Cervical back: Normal range of motion and neck supple.  Skin:    General: Skin is warm and dry.  Neurological:     General: No focal deficit present.     Mental Status: She is alert and oriented to person, place, and time.  Psychiatric:        Attention and Perception: Attention normal. She perceives visual hallucinations. She does not perceive auditory hallucinations.  Mood and Affect: Mood is anxious and depressed. Affect is blunt.        Speech: Speech normal.        Behavior: Behavior normal. Behavior is cooperative.        Thought Content: Thought content is paranoid. Thought content is not delusional. Thought content includes suicidal ideation. Thought content does not include homicidal ideation. Thought content includes suicidal plan.        Cognition and Memory: Cognition and memory normal.        Judgment: Judgment is impulsive.   Review of Systems  Constitutional: Negative.   HENT: Negative.    Eyes: Negative.   Respiratory: Negative.    Cardiovascular: Negative.   Gastrointestinal: Negative.   Skin: Negative.   Neurological: Negative.   Psychiatric/Behavioral:  Positive for depression, hallucinations and suicidal ideas. Negative for substance abuse. The patient is nervous/anxious. The patient does not have insomnia.    Blood pressure 132/86, pulse (!) 109, temperature 97.9 F (36.6 C), temperature source Oral, resp. rate 20, height '5\' 2"'$  (1.575 m), weight (!) 149.7 kg, SpO2 94 %. Body mass index is 60.36 kg/m.   Treatment Plan Summary:  Daily contact with patient to assess and evaluate symptoms and progress  in treatment and Medication management  Plan:  Patient continues to endorse worsening depression and anxiety.  Patient endorses suicidal ideations with a plan to overdose, but is able to contract for safety while admitted.  Patient reports that she continues to have visual hallucinations characterized by seeing shadows.  Patient requesting to be placed back on her Prozac for the management of her depressive symptoms.  During patient's assessment, patient informed provider that she does not have a personal CPAP machine for use.  Patient reports some mild chest pain and mild left arm numbness.  She reports that her left arm numbness is normal for her and denies nausea, vomiting, or left-sided jaw pain.  Safety and Monitoring: Voluntary admission to inpatient psychiatric unit for safety, stabilization and treatment Daily contact with patient to assess and evaluate symptoms and progress in treatment Patient's case to be discussed in multi-disciplinary team meeting Observation Level : q15 minute checks Vital signs: q12 hours Precautions: safety   Diagnosis:  Principal Problem:   Bipolar disorder, curr episode depressed, severe, w/psychotic features ([place]) Active Problems:   PTSD (post-traumatic stress disorder)   Polysubstance abuse ([place])  #Bipolar disorder, current episode depressed, severe, with psychotic features -Increase Abilify to 10 mg daily for depressed mood and mood stability -Start Prozac 20 mg with the plan to increase to 40 mg daily starting tomorrow for depressed mood  Agitation protocol             -Olanzapine Zydis 5 mg every 8 hours as needed for agitation AND             -Lorazepam 1 mg as needed for anxiety and severe agitation AND             -Ziprasidone 20 mg injection as needed for agitation  #Insomnia -Change trazodone 100 mg at bedtime to as needed -Continue prazosin 1 mg at bedtime -Start melatonin 5 mg daily for sleep  #Polysubstance abuse -Patient has a  history of marijuana, cocaine, and heroin use. -Patient reports that she used 2 g of cocaine 4 days ago -Urine drug screen positive for cocaine -Will recommend patient explore residential treatment facility -Recommend patient abstain from illicit substance use  Anxiety -Hydroxyzine 50 mg 3 times daily as needed for anxiety  As needed medications: -Patient to continue taking Tylenol 650 mg every 6 hours as needed for mild pain -Patient to continue taking Maalox/Mylanta 30 mL every 4 hours as needed for indigestion -Patient to continue taking Milk of Magnesia 30 mL as needed for mild constipation  Patient EKG results were discussed with Dr. Johney Frame -Per Dr. Johney Frame, patient's EKG result is not concerning and results due to lead placement and breast tissue. -Provider recommends patient follow up with outpatient upon discharge -EKG results cleared  Patient informed provider that she does not have a personal CPAP machine  Fasting lipid panel significant for elevated triglycerides (213 mg/dL), decreased HDL (37 mg/dL), and elevated VLDL (43 mg/dL). Hemoglobin A1c pending  Discharge Planning: Social work and case management to assist with discharge planning and identification of hospital follow-up needs prior to discharge Estimated LOS: 5-7 days Discharge Concerns: Need to establish a safety plan; Medication compliance and effectiveness Discharge Goals: Return home with outpatient referrals for mental health follow-up including medication management/psychotherapy    I certify that inpatient services furnished can reasonably be expected to improve the patient's condition.    Malachy Mood, PA 04/02/2022, 11:56 AM

## 2022-04-02 NOTE — Plan of Care (Signed)
Nurse discussed coping skills with patient.  

## 2022-04-02 NOTE — [facility] Group Notes (Signed)
Adult Psychoeducational Group  Date:  04/02/2022 Time:  1300-1400  Group Topic/Focus: Continuation of the group from Saturday. Looking at the lists that were created and talking about what needs to be done with the homework of 30 positives about themselves.                                     Talking about taking their power back and helping themselves to develop a positive self esteem.      Participation Quality: did not attend  Paulino Rily

## 2022-04-02 NOTE — Progress Notes (Signed)
[facility] Group Notes:  (Nursing/MHT/Case Management/Adjunct)  Date:  04/02/2022  Time:  2015  Type of Therapy:   wrap up group  Participation Level:  Active  Participation Quality:  Appropriate, Attentive, Sharing, and Supportive  Affect:  Depressed  Cognitive:  Alert  Insight:  Improving  Engagement in Group:  Developing/Improving  Modes of Intervention:  Clarification, Education, and Support  Summary of Progress/Problems: Positive thinking and positive change were discussed.   Winfield Rast S 04/02/2022, 9:09 PM

## 2022-04-02 NOTE — Progress Notes (Signed)
   04/02/22 2052  Psych Admission Type (Psych Patients Only)  Admission Status Involuntary  Psychosocial Assessment  Patient Complaints Anxiety  Eye Contact Brief  Facial Expression Anxious  Affect Appropriate to circumstance  Speech Logical/coherent  Interaction Assertive  Motor Activity Slow  Appearance/Hygiene Disheveled  Behavior Characteristics Appropriate to situation  Mood Anxious;Depressed  Thought Process  Coherency WDL  Content WDL  Delusions None reported or observed  Perception WDL  Hallucination None reported or observed  Judgment Poor  Confusion None  Danger to Self  Current suicidal ideation? Denies  Self-Injurious Behavior No self-injurious ideation or behavior indicators observed or expressed   Agreement Not to Harm Self Yes  Description of Agreement verbal  Danger to Others  Danger to Others None reported or observed
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
## 2022-04-02 NOTE — Progress Notes (Signed)
D:  Patient's self inventory sheet, patient has fair sleep, no sleep medicine.  Good appetite, low energy level, poor concentration.  Rated depression, hopeless and anxiety 10.  Withdrawals, tremors, cramping, nausea.  SI, contracts for safety, no plan at [facility].  Physical problems, pain, worst pain #8.   "I want to die." A:  Medications administered per MD orders.  Emotional support and encouragement given patient. R:  Denied SI and HI, contracts for safety.  Denied A/V hallucinations.  Safety maintained with 15 minute checks.  Patient has been in bed most of the day.   Stated she has been going to dining room for meals and attended morning groups.

## 2022-04-03 ENCOUNTER — Inpatient Hospital Stay (HOSPITAL_COMMUNITY): Payer: 59

## 2022-04-03 ENCOUNTER — Other Ambulatory Visit (HOSPITAL_COMMUNITY): Payer: Self-pay

## 2022-04-03 DIAGNOSIS — G4700 Insomnia, unspecified: Secondary | ICD-10-CM | POA: Insufficient documentation

## 2022-04-03 DIAGNOSIS — F411 Generalized anxiety disorder: Secondary | ICD-10-CM | POA: Insufficient documentation

## 2022-04-03 LAB — BASIC METABOLIC PANEL
Anion gap: 3 — ABNORMAL LOW (ref 5–15)
BUN: 14 mg/dL (ref 6–20)
CO2: 27 mmol/L (ref 22–32)
Calcium: 8.2 mg/dL — ABNORMAL LOW (ref 8.9–10.3)
Chloride: 106 mmol/L (ref 98–111)
Creatinine, Ser: 0.77 mg/dL (ref 0.44–1.00)
GFR, Estimated: >60 mL/min (ref >60)
Glucose, Bld: 114 mg/dL — ABNORMAL HIGH (ref 70–99)
Potassium: 4.2 mmol/L (ref 3.5–5.1)
Sodium: 136 mmol/L (ref 135–145)

## 2022-04-03 LAB — CBC
HCT: 44.1 % (ref 36.0–46.0)
Hemoglobin: 13.7 g/dL (ref 12.0–15.0)
MCH: 31.2 pg (ref 26.0–34.0)
MCHC: 31.1 g/dL (ref 30.0–36.0)
MCV: 100.5 fL — ABNORMAL HIGH (ref 80.0–100.0)
Platelets: 294 10*3/uL (ref 150–400)
RBC: 4.39 MIL/uL (ref 3.87–5.11)
RDW: 14.5 % (ref 11.5–15.5)
WBC: 8 10*3/uL (ref 4.0–10.5)
nRBC: 0 % (ref 0.0–0.2)

## 2022-04-03 LAB — TROPONIN I (HIGH SENSITIVITY)
Troponin I (High Sensitivity): 3 ng/L (ref <18)
Troponin I (High Sensitivity): 3 ng/L (ref <18)

## 2022-04-03 MED ORDER — FLUOXETINE HCL 20 MG PO CAPS
40.0000 mg | ORAL_CAPSULE | Freq: Every day | ORAL | Status: DC
  Administered 2022-04-03 – 2022-04-06 (×4): 40 mg via ORAL
  Filled 2022-04-03 (×2): qty 2
  Filled 2022-04-03: qty 14
  Filled 2022-04-03 (×4): qty 2

## 2022-04-03 MED ORDER — PANTOPRAZOLE SODIUM 40 MG PO TBEC
40.0000 mg | DELAYED_RELEASE_TABLET | Freq: Every day | ORAL | Status: DC
  Administered 2022-04-03 – 2022-04-06 (×4): 40 mg via ORAL
  Filled 2022-04-03 (×3): qty 1
  Filled 2022-04-03: qty 7
  Filled 2022-04-03 (×3): qty 1

## 2022-04-03 MED ORDER — DOXYCYCLINE HYCLATE 100 MG PO CAPS
100.0000 mg | ORAL_CAPSULE | Freq: Two times a day (BID) | ORAL | 0 refills | Status: DC
  Filled 2022-04-03: qty 14, 7d supply, fill #0

## 2022-04-03 MED ORDER — DOXYCYCLINE HYCLATE 100 MG PO TABS
100.0000 mg | ORAL_TABLET | Freq: Once | ORAL | Status: AC
  Administered 2022-04-03: 100 mg via ORAL
  Filled 2022-04-03: qty 1

## 2022-04-03 MED ORDER — IBUPROFEN 400 MG PO TABS
400.0000 mg | ORAL_TABLET | Freq: Four times a day (QID) | ORAL | Status: DC | PRN
  Administered 2022-04-03 – 2022-04-06 (×4): 400 mg via ORAL
  Filled 2022-04-03 (×4): qty 1

## 2022-04-03 MED ORDER — ACETAMINOPHEN 500 MG PO TABS
1000.0000 mg | ORAL_TABLET | Freq: Once | ORAL | Status: AC
  Administered 2022-04-03: 1000 mg via ORAL
  Filled 2022-04-03: qty 2

## 2022-04-03 NOTE — Progress Notes (Signed)
Provider was able to reach out to Dr. Wyvonnia Dusky and provided report for patient being transferred to [name] ED for medical clearance.  Dr. Wyvonnia Dusky was informed that patient is under IVC and will be transferred back to [facility] once medically cleared.

## 2022-04-03 NOTE — Progress Notes (Signed)
Sarah Cervantes is a 41 year old, Caucasian female with a past psychiatric history significant for PTSD, bipolar disorder, polysubstance abuse, and generalized anxiety disorder who is currently involuntarily admitted to [facility] suicidal ideations with a plan.  Patient was evaluated by Ileene Musa, PA-C in the morning.  During patient's assessment, patient made complaints of not feeling well.  She reported bouts of diarrhea overnight and reports that she threw up 45 minutes prior to her assessment.  Patient was unsure of fever but endorsed body aches and fatigue.  Patient further endorsed chest pain, rattling in her chest, difficulty breathing, jaw pain, and numbness in her left arm.  Patient being transferred over to [facility] ED for medical clearance due to concerns of chest pain, jaw pain, and numbness in left arm.

## 2022-04-03 NOTE — Progress Notes (Signed)
The patient attended the A.A.meeting and was appropriate.

## 2022-04-03 NOTE — Progress Notes (Signed)
Semaglutide not given to pt, medication is not available. RN notified pt that they may have a family member or friend bring this medication in for them if they wish to receive it.

## 2022-04-03 NOTE — Discharge Instructions (Addendum)
Your workup was reassuring.  Please follow up with your primary care provider.  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  Please follow-up your primary care provider.  This does not necessarily need any additional testing or imaging.  This may cause some of the knee pain given experiencing however since you did traumatize your knee this may be an incidental finding.  Tylenol '1000mg'$  every 6 hours as needed for pain   -Follow-up with your outpatient psychiatric provider -instructions on appointment date, time, and address (location) are provided to you in discharge paperwork.  -Take your psychiatric medications as prescribed at discharge - instructions are provided to you in the discharge paperwork  -Follow-up with outpatient primary care doctor and other specialists -for management of preventative medicine and any chronic medical disease.  -Recommend abstinence from alcohol, tobacco, and other illicit drug use at discharge.   -If your psychiatric symptoms recur, worsen, or if you have side effects to your psychiatric medications, call your outpatient psychiatric provider, 911, [phone] or go to the nearest emergency department.  -If suicidal thoughts recur, call your outpatient psychiatric provider, 911, [phone] or go to the nearest emergency department.

## 2022-04-03 NOTE — Plan of Care (Signed)
Nurse discussed anxiety, depression and coping skills with patient.  

## 2022-04-03 NOTE — ED Triage Notes (Signed)
Pt from [facility] via [place] for c/o lethargy, aches and chills. Also c/o right knee pain. IVC. VSS. Sitter at bedside.

## 2022-04-03 NOTE — Progress Notes (Signed)
Patient was sent to [facility] ER for tests concerning her medication problems, body aches, decreased energy, staying in bed most of the day and night.  Did attend two groups yesterday.  Patient returned from ER to [facility] approximately 4:00 pm and the first thing  she said was "Where's  my neurotin?"  Patient has been walking in her room

## 2022-04-03 NOTE — Progress Notes (Signed)
[facility] MD Progress Note  04/03/2022 5:18 PM Catalaya Garr  MRN:  [id number] Subjective:    Sarah Cervantes is a 41 year old, Caucasian female with a past psychiatric history significant for PTSD, bipolar disorder, polysubstance abuse, and generalized anxiety disorder who was involuntarily admitted to [facility] due to suicidal ideations with a plan.   Patient was assessed by Ileene Musa, PA and Dr. Caswell Corwin for reevaluation.  Patient is currently being managed on the following psychiatric medications:  Abilify 10 mg daily Prozac 40 mg daily Trazodone 100 mg at bedtime  Prazosin 1 mg at bedtime Melatonin 5 mg at bedtime Hydroxyzine 50 mg 3 times daily as needed  Agitation protocol             -Olanzapine Zydis 5 mg every 8 hours as needed for agitation AND             -Lorazepam 1 mg as needed for anxiety and severe agitation AND             -Ziprasidone 20 mg injection as needed for agitation  Patient was assessed in the morning and reported not feeling well.  She reported having diarrhea as well as throwing up roughly 45 minutes prior to the encounter.  She was unsure if fever but endorsed body aches, fatigue.  She further endorsed chest pain, difficulty breathing, jaw pain and numbness in her left arm.    Patient was subsequently admitted to [name] ED for medical clearance and report with given to EDP.  While patient was in the ED, imaging was performed with findings significant right knee pain with a benign bony tumor and unremarkable chest x-ray.  Patient was provided doxycycline for possible infiltrate.  EKG was also performed with results consistent with normal sinus rhythm/nonischemic.  Patient was transferred back to [facility] once medically cleared.  During her assessment, patient continues to endorse depression she rates a 7 out of 10 with 10 being severe.  Patient endorses anxiety and rates her anxiety as 5 out of 10.  She continues to endorse suicidal  ideations with a plan to overdose.  She denies homicidal ideations.  She endorses visual hallucinations characterized by seeing shadows and states that she is always seeing shadows growing up.  She denies paranoia or delusional thoughts.  She endorses poor sleep and states that she received 3 hours of sleep the previous night.  Patient endorses good appetite. Patient continues to endorse knee pain and is requesting pain management.  Patient is casually dressed, alert and oriented x 4.  Patient is calm, cooperative, and fully engaged in conversation during the encounter.  Patient maintains fair eye contact.  Patient's speech is clear, coherent, and with normal rate.  Patient's thought process is logical.  Patient's thought content is coherent.  Patient's mood is depressed and anxious with congruent affect.  Patient continues to endorse seeing shadows but does not appear to be responding to internal/external stimuli.  Principal Problem: Bipolar disorder, curr episode depressed, severe, w/psychotic features ([place]) Diagnosis: Principal Problem:   Bipolar disorder, curr episode depressed, severe, w/psychotic features ([place]) Active Problems:   PTSD (post-traumatic stress disorder)   Polysubstance abuse ([place])   Insomnia   Anxiety state  Total Time spent with patient: 20 minutes  Past Psychiatric History:  Bipolar disorder PTSD Polysubstance abuse  Past Medical History:  Past Medical History:  Diagnosis Date   Cocaine abuse, daily use ([place]) 03/23/2018   ETOH abuse 03/23/2018   Heroin abuse ([place]) 03/23/2018  History reviewed. No pertinent surgical history. Family History: History reviewed. No pertinent family history. Family Psychiatric  History:  Patient reports that most of her family members have some type of psychiatric illness but was unsure of what they specifically had    Social History:  Social History   Substance and Sexual Activity  Alcohol Use Yes   Alcohol/week: 12.0 - 15.0  standard drinks of alcohol   Types: 12 - 15 Cans of beer per week   Comment: drinking daily for the past week     Social History   Substance and Sexual Activity  Drug Use Yes   Types: Cocaine    Social History   Socioeconomic History   Marital status: Single    Spouse name: Not on file   Number of children: Not on file   Years of education: Not on file   Highest education level: Not on file  Occupational History   Not on file  Tobacco Use   Smoking status: Former    Packs/day: 0.50    Years: 30.00    Total pack years: 15.00    Types: Cigarettes    Quit date: 01/12/2022    Years since quitting: 0.2   Smokeless tobacco: Never  Vaping Use   Vaping Use: Never used  Substance and Sexual Activity   Alcohol use: Yes    Alcohol/week: 12.0 - 15.0 standard drinks of alcohol    Types: 12 - 15 Cans of beer per week    Comment: drinking daily for the past week   Drug use: Yes    Types: Cocaine   Sexual activity: Yes    Birth control/protection: None  Other Topics Concern   Not on file  Social History Narrative   Not on file   Social Determinants of Health   Financial Resource Strain: Not on file  Food Insecurity: Unknown (03/31/2022)   Hunger Vital Sign    Worried About Running Out of Food in the Last Year: Patient refused    Ran Out of Food in the Last Year: Patient refused  Recent Concern: [place] Present (03/15/2022)   Hunger Vital Sign    Worried About Running Out of Food in the Last Year: Often true    Ran Out of Food in the Last Year: Often true  Transportation Needs: Unknown (03/31/2022)   PRAPARE - Hydrologist (Medical): Patient refused    Lack of Transportation (Non-Medical): Patient refused  Recent Concern: Transportation Needs - Unmet Transportation Needs (03/15/2022)   PRAPARE - Hydrologist (Medical): Yes    Lack of Transportation (Non-Medical): Yes  Physical Activity: Not  on file  Stress: Not on file  Social Connections: Not on file   Additional Social History:   Sleep: Poor  Appetite:  Good  Current Medications: Current Facility-Administered Medications  Medication Dose Route Frequency Provider Last Rate Last Admin   acetaminophen (TYLENOL) tablet 650 mg  650 mg Oral Q6H PRN Leevy-Johnson, Brooke A, NP   650 mg at 04/03/22 1634   albuterol (VENTOLIN HFA) 108 (90 Base) MCG/ACT inhaler 2 puff  2 puff Inhalation Q6H PRN Yaretzy Olazabal E, PA       alum & mag hydroxide-simeth (MAALOX/MYLANTA) 200-200-20 MG/5ML suspension 30 mL  30 mL Oral Q4H PRN Leevy-Johnson, Brooke A, NP       amLODipine (NORVASC) tablet 5 mg  5 mg Oral Daily Chariah Bailey E, PA   5 mg  at 04/03/22 0813   ARIPiprazole (ABILIFY) tablet 10 mg  10 mg Oral Daily Renelle Stegenga E, PA   10 mg at 04/03/22 0813   docusate sodium (COLACE) capsule 200 mg  200 mg Oral Daily Alvan Culpepper E, PA   200 mg at 04/03/22 0813   FLUoxetine (PROZAC) capsule 40 mg  40 mg Oral Daily Raylin Diguglielmo E, PA   40 mg at 04/03/22 0818   furosemide (LASIX) tablet 40 mg  40 mg Oral Daily Daiel Strohecker E, PA   40 mg at 04/03/22 0813   gabapentin (NEURONTIN) capsule 800 mg  800 mg Oral TID Leevy-Johnson, Brooke A, NP   800 mg at 04/03/22 1634   hydrOXYzine (ATARAX) tablet 50 mg  50 mg Oral TID PRN Cruze Zingaro E, PA   50 mg at 04/02/22 1948   ibuprofen (ADVIL) tablet 400 mg  400 mg Oral Q6H PRN Lyne Khurana E, PA       lisinopril (ZESTRIL) tablet 10 mg  10 mg Oral q AM Lamonica Trueba E, PA       lisinopril (ZESTRIL) tablet 10 mg  10 mg Oral q AM Massengill, Ovid Curd, MD   10 mg at 04/03/22 0814   OLANZapine zydis (ZYPREXA) disintegrating tablet 5 mg  5 mg Oral Q8H PRN Leevy-Johnson, Brooke A, NP   5 mg at 04/01/22 2876   And   LORazepam (ATIVAN) tablet 1 mg  1 mg Oral PRN Leevy-Johnson, Brooke A, NP       And   ziprasidone (GEODON) injection 20 mg  20 mg Intramuscular PRN Leevy-Johnson, Brooke A, NP       magnesium  hydroxide (MILK OF MAGNESIA) suspension 30 mL  30 mL Oral Daily PRN Leevy-Johnson, Brooke A, NP       melatonin tablet 5 mg  5 mg Oral QHS Merie Wulf E, PA   5 mg at 04/02/22 2115   mometasone-formoterol (DULERA) 100-5 MCG/ACT inhaler 2 puff  2 puff Inhalation BID Massengill, Ovid Curd, MD   2 puff at 04/03/22 0816   pantoprazole (PROTONIX) EC tablet 40 mg  40 mg Oral Daily Ruhaan Nordahl E, PA       prazosin (MINIPRESS) capsule 1 mg  1 mg Oral QHS Leevy-Johnson, Brooke A, NP   1 mg at 04/02/22 2115   Semaglutide(0.25 or 0.'5MG'$ /DOS) SOPN 0.5 mg  0.5 mg Subcutaneous Q Mon Senaya Dicenso E, PA       traZODone (DESYREL) tablet 100 mg  100 mg Oral QHS Awanda Wilcock E, PA   100 mg at 04/02/22 2114   traZODone (DESYREL) tablet 50 mg  50 mg Oral QHS PRN Leevy-Johnson, Brooke A, NP   50 mg at 03/31/22 2129    Lab Results:  Results for orders placed or performed during the hospital encounter of 03/31/22 (from the past 48 hour(s))  Hemoglobin A1c     Status: None   Collection Time: 04/02/22  6:33 AM  Result Value Ref Range   Hgb A1c MFr Bld 5.5 4.8 - 5.6 %    Comment: (NOTE) Pre diabetes:          5.7%-6.4%  Diabetes:              >6.4%  Glycemic control for   <7.0% adults with diabetes    Mean Plasma Glucose 111.15 mg/dL    Comment: Performed at [facility], [place] [address] [place], [place] [address]  Lipid panel     Status: Abnormal   Collection  Time: 04/02/22  6:33 AM  Result Value Ref Range   Cholesterol 159 0 - 200 mg/dL   Triglycerides 213 (H) <150 mg/dL   HDL 37 (L) >40 mg/dL   Total CHOL/HDL Ratio 4.3 RATIO   VLDL 43 (H) 0 - 40 mg/dL   LDL Cholesterol 79 0 - 99 mg/dL    Comment:        Total Cholesterol/HDL:CHD Risk Coronary Heart Disease Risk Table                     Men   Women  1/2 Average Risk   3.4   3.3  Average Risk       5.0   4.4  2 X Average Risk   9.6   7.1  3 X Average Risk  23.4   11.0        Use the calculated Patient Ratio above and the CHD Risk  Table to determine the patient's CHD Risk.        ATP III CLASSIFICATION (LDL):  <100     mg/dL   Optimal  100-129  mg/dL   Near or Above                    Optimal  130-159  mg/dL   Borderline  160-189  mg/dL   High  >190     mg/dL   Very High Performed at [facility] [address], [place], [place] [address]   Basic metabolic panel     Status: Abnormal   Collection Time: 04/03/22 10:48 AM  Result Value Ref Range   Sodium 136 135 - 145 mmol/L   Potassium 4.2 3.5 - 5.1 mmol/L   Chloride 106 98 - 111 mmol/L   CO2 27 22 - 32 mmol/L   Glucose, Bld 114 (H) 70 - 99 mg/dL    Comment: Glucose reference range applies only to samples taken after fasting for at least 8 hours.   BUN 14 6 - 20 mg/dL   Creatinine, Ser 0.77 0.44 - 1.00 mg/dL   Calcium 8.2 (L) 8.9 - 10.3 mg/dL   GFR, Estimated >60 >60 mL/min    Comment: (NOTE) Calculated using the CKD-EPI Creatinine Equation (2021)    Anion gap 3 (L) 5 - 15    Comment: Performed at [facility], [place] [address] [place], [place] [address]  CBC     Status: Abnormal   Collection Time: 04/03/22 10:48 AM  Result Value Ref Range   WBC 8.0 4.0 - 10.5 K/uL   RBC 4.39 3.87 - 5.11 MIL/uL   Hemoglobin 13.7 12.0 - 15.0 g/dL   HCT 44.1 36.0 - 46.0 %   MCV 100.5 (H) 80.0 - 100.0 fL   MCH 31.2 26.0 - 34.0 pg   MCHC 31.1 30.0 - 36.0 g/dL   RDW 14.5 11.5 - 15.5 %   Platelets 294 150 - 400 K/uL   nRBC 0.0 0.0 - 0.2 %    Comment: Performed at [facility], [place] [address], [place],  [address]  Troponin I (High Sensitivity)     Status: None   Collection Time: 04/03/22 10:48 AM  Result Value Ref Range   Troponin I (High Sensitivity) 3 <18 ng/L    Comment: (NOTE) Elevated high sensitivity troponin I (hsTnI) values and significant  changes across serial measurements may suggest ACS but many other  chronic and acute conditions are known to elevate hsTnI results.  Refer to the "Links"  section for chest pain algorithms and additional  guidance. Performed at [facility], [place] [address] [place], [place] [address]   Troponin I (High Sensitivity)     Status: None   Collection Time: 04/03/22  1:15 PM  Result Value Ref Range   Troponin I (High Sensitivity) 3 <18 ng/L    Comment: (NOTE) Elevated high sensitivity troponin I (hsTnI) values and significant  changes across serial measurements may suggest ACS but many other  chronic and acute conditions are known to elevate hsTnI results.  Refer to the "Links" section for chest pain algorithms and additional  guidance. Performed at [facility], [place] [address], [place], [place] [address]     Blood Alcohol level:  Lab Results  Component Value Date   [facility] <10 03/30/2022   ETH <10 07/68/0881    Metabolic Disorder Labs: Lab Results  Component Value Date   HGBA1C 5.5 04/02/2022   MPG 111.15 04/02/2022   MPG 122.63 03/15/2022   No results found for: "PROLACTIN" Lab Results  Component Value Date   CHOL 159 04/02/2022   TRIG 213 (H) 04/02/2022   HDL 37 (L) 04/02/2022   CHOLHDL 4.3 04/02/2022   VLDL 43 (H) 04/02/2022   LDLCALC 79 04/02/2022    Physical Findings: AIMS:  , ,  ,  ,    CIWA:    COWS:     Musculoskeletal: Strength & Muscle Tone: within normal limits Gait & Station: normal Patient leans: N/A  Psychiatric Specialty Exam:  Presentation  General Appearance:  Appropriate for Environment; Casual  Eye Contact: Fair  Speech: Clear and Coherent; Normal Rate  Speech Volume: Normal  Handedness: Right   Mood and Affect  Mood: Depressed; Anxious  Affect: Depressed; Congruent   Thought Process  Thought Processes: Coherent  Descriptions of Associations:Intact  Orientation:Full (Time, Place and Person)  Thought Content:Logical  History of Schizophrenia/Schizoaffective disorder:No  Duration of Psychotic  Symptoms:N/A  Hallucinations:Hallucinations: Visual Description of Visual Hallucinations: Patient reports that they see shadows  Ideas of Reference:Paranoia  Suicidal Thoughts:Suicidal Thoughts: Yes, Active SI Active Intent and/or Plan: With Intent; With Plan  Homicidal Thoughts:Homicidal Thoughts: No   Sensorium  Memory: Immediate Good; Recent Fair; Remote Fair  Judgment: Fair  Insight: Fair   Community education officer  Concentration: Good  Attention Span: Good  Recall: [place] of Knowledge: Fair  Language: Good   Psychomotor Activity  Psychomotor Activity: Psychomotor Activity: Normal   Assets  Assets: Communication Skills; Desire for Improvement   Sleep  Sleep: Sleep: Poor Number of Hours of Sleep: 3    Physical Exam: Physical Exam Constitutional:      Appearance: Normal appearance.  HENT:     Head: Normocephalic and atraumatic.     Nose: Nose normal.     Mouth/Throat:     Mouth: Mucous membranes are moist.  Eyes:     Extraocular Movements: Extraocular movements intact.     Pupils: Pupils are equal, round, and reactive to light.  Cardiovascular:     Rate and Rhythm: Normal rate and regular rhythm.  Pulmonary:     Effort: Pulmonary effort is normal.     Breath sounds: Normal breath sounds.  Abdominal:     General: Abdomen is flat.  Musculoskeletal:        General: Normal range of motion.     Cervical back: Normal range of motion and neck supple.  Skin:    General: Skin is warm and dry.  Neurological:     General: No focal deficit present.     Mental Status: She is alert and oriented to person, place, and time.  Psychiatric:        Attention and Perception: Attention normal. She perceives visual hallucinations. She does not perceive auditory hallucinations.        Mood and Affect: Mood is anxious and depressed. Affect is blunt.        Speech: Speech normal.        Behavior: Behavior normal. Behavior is cooperative.        Thought  Content: Thought content is paranoid. Thought content is not delusional. Thought content includes suicidal ideation. Thought content does not include homicidal ideation. Thought content includes suicidal plan.        Cognition and Memory: Cognition and memory normal.        Judgment: Judgment normal.    Review of Systems  Constitutional: Negative.   HENT: Negative.    Eyes: Negative.   Respiratory: Negative.    Cardiovascular: Negative.   Gastrointestinal: Negative.   Musculoskeletal:  Positive for joint pain.  Skin: Negative.   Neurological: Negative.   Psychiatric/Behavioral:  Positive for depression, hallucinations, substance abuse and suicidal ideas. The patient is nervous/anxious and has insomnia.    Blood pressure 112/64, pulse 79, temperature (!) 97.5 F (36.4 C), temperature source Oral, resp. rate 18, height '5\' 2"'$  (1.575 m), weight (!) 149.7 kg, SpO2 93 %. Body mass index is 60.36 kg/m.   Treatment Plan Summary:  Daily contact with patient to assess and evaluate symptoms and progress in treatment and Medication management  Plan:  Patient was medically cleared at [facility] ED after experiencing chest pain, difficulty breathing, jaw pain, and numbness in her left arm.  Patient continues to endorse depression and anxiety.  Patient suicidal ideations are still present and patient continues to endorse a plan to overdose.  Patient expresses ongoing knee pain and requests medication management.  Patient was ordered ibuprofen 400 mg every 6 hours as needed for right knee pain.  Patient was also ordered Protonix 40 mg daily to reduce risk of GI bleed from the use of NSAID and SSRI in the form of Prozac.  Safety and Monitoring: Voluntary admission to inpatient psychiatric unit for safety, stabilization and treatment Daily contact with patient to assess and evaluate symptoms and progress in treatment Patient's case to be discussed in multi-disciplinary team meeting Observation Level  : q15 minute checks Vital signs: q12 hours Precautions: suicide, elopement, and assault  Diagnosis: Principal Problem:   Bipolar disorder, curr episode depressed, severe, w/psychotic features ([place]) Active Problems:   PTSD (post-traumatic stress disorder)   Polysubstance abuse ([place])   Insomnia   Anxiety state  #Bipolar disorder, current episode depressed, severe, with psychotic features -Continue Abilify 10 mg daily for depressed mood and mood stability -Continue Prozac 40 mg daily for depressed mood  Agitation protocol             -Olanzapine Zydis 5 mg every 8 hours as needed for agitation AND             -Lorazepam 1 mg as needed for anxiety and severe agitation AND             -Ziprasidone 20 mg injection as needed for agitation  #Insomnia Patient reports that she had a sleep study formed prior to admission that confirmed a diagnosis of sleep apnea. -Continue trazodone 100 mg at bedtime to as needed -Continue prazosin 1 mg  at bedtime -Continue melatonin 5 mg daily for sleep  #Anxiety -Continue Hydroxyzine 50 mg 3 times daily as needed for anxiety  As needed medications: -Patient to continue taking Tylenol 650 mg every 6 hours as needed for mild pain -Patient to continue taking Maalox/Mylanta 30 mL every 4 hours as needed for indigestion -Patient to continue taking Milk of Magnesia 30 mL as needed for mild constipation  ED Course: Basic metabolic panel was significant for elevated blood glucose (114 mg/dL) and decreased calcium (8.2 mg/dL).  CBC was significant for increased MCV (100.5 fL).  Troponin level found to be within normal limits.  Discharge Planning: Social work and case management to assist with discharge planning and identification of hospital follow-up needs prior to discharge Estimated LOS: 5-7 days Discharge Concerns: Need to establish a safety plan; Medication compliance and effectiveness Discharge Goals: Return home with outpatient referrals for mental  health follow-up including medication management/psychotherapy   I certify that inpatient services furnished can reasonably be expected to improve the patient's condition.  Malachy Mood, PA 04/03/2022, 5:18 PM

## 2022-04-03 NOTE — ED Provider Notes (Signed)
[facility] DEPT Provider Note   CSN: [id number] Arrival date & time: 04/03/22  1007     History  No chief complaint on file.   Sarah Cervantes is a 41 y.o. female.  HPI Patient with cocaine use, heroin use, alcohol use Patient is a 41 year old female presented emergency room today [facility] after she experienced an episode of chest pain.  She states that it is sternal nonradiating nonexertional nonpleuritic.  She also complains of "full body pain "she states that she feels achy and fatigued.  She states she has had some pain in her right knee which she states is her primary issue.  She states that earlier she sat down on the ground and then in attempting to stand up got her right foot underneath her and then smacked her right knee on the ground well over balancing and falling forward slightly.  She states that her knee is aching significantly.  She does not take any medications for symptoms  [facility].      Home Medications Prior to Admission medications   Medication Sig Start Date End Date Taking? Authorizing Provider  acetaminophen (TYLENOL) 325 MG tablet Take 2 tablets (650 mg total) by mouth every 6 (six) hours as needed for mild pain (or Fever >/= 101). 03/16/22   Sheikh, Georgina Quint Latif, DO  albuterol (VENTOLIN HFA) 108 (90 Base) MCG/ACT inhaler Inhale 2 puffs into the lungs every 6 (six) hours as needed for wheezing or shortness of breath.    [provider]  amLODipine (NORVASC) 5 MG tablet Take 5 mg by mouth daily. 02/28/22   [provider]  ARIPiprazole (ABILIFY) 5 MG tablet Take 1 tablet (5 mg total) by mouth daily. 04/23/18   Derrill Center, NP  budesonide-formoterol (SYMBICORT) 80-4.5 MCG/ACT inhaler 2 puffs daily. 02/28/22   [provider]  butalbital-acetaminophen-caffeine (FIORICET) 50-325-40 MG tablet Take  1 tablet by mouth every 4 (four) hours as needed for headache. 02/28/22   [provider]  docusate sodium (COLACE) 100 MG capsule Take 1 capsule (100 mg total) by mouth 2 (two) times daily. Patient taking differently: Take 200 mg by mouth daily. 03/16/22   Sheikh, Omair Latif, DO  FLUoxetine (PROZAC) 40 MG capsule Take 40 mg by mouth in the morning.    [provider]  furosemide (LASIX) 40 MG tablet Take 1 tablet (40 mg total) by mouth daily. 03/16/22 04/15/22  Raiford Noble Latif, DO  gabapentin (NEURONTIN) 400 MG capsule Take 1 capsule (400 mg total) by mouth 3 (three) times daily. Patient taking differently: Take 800 mg by mouth 3 (three) times daily. 03/16/22   Raiford Noble Latif, DO  hydrocortisone (ANUSOL-HC) 2.5 % rectal cream Place rectally 3 (three) times daily. Patient taking differently: Place 1 Application rectally daily as needed for hemorrhoids. 03/26/18   Charlynne Cousins, MD  hydrOXYzine (ATARAX/VISTARIL) 50 MG tablet Take 1 tablet (50 mg total) by mouth 3 (three) times daily as needed for anxiety. Patient taking differently: Take 50 mg by mouth daily as needed for anxiety. 04/22/18   Derrill Center, NP  ibuprofen (ADVIL) 200 MG tablet Take 400 mg by mouth every 8 (eight) hours as needed for headache or mild pain.    [provider]  lidocaine (LIDODERM) 5 % Place 1 patch onto the skin daily. Remove & Discard patch within 12 hours or as directed by MD Patient taking differently:  Place 2 patches onto the skin daily. Remove & Discard patch within 12 hours or as directed by MD 03/16/22   Raiford Noble Latif, DO  lisinopril (ZESTRIL) 10 MG tablet Take 10 mg by mouth in the morning.    [provider]  methocarbamol (ROBAXIN) 500 MG tablet Take 1 tablet (500 mg total) by mouth every 8 (eight) hours as needed for muscle spasms. 03/16/22   Raiford Noble Latif, DO  nicotine polacrilex (NICORETTE) 2 MG gum Take 1 each (2 mg total) by mouth as needed  for smoking cessation. Patient not taking: Reported on 03/14/2022 04/22/18   Derrill Center, NP  ondansetron (ZOFRAN) 4 MG tablet Take 1 tablet (4 mg total) by mouth every 6 (six) hours as needed for nausea. Patient taking differently: Take 4 mg by mouth every 6 (six) hours as needed for nausea. odt 03/16/22   Sheikh, Omair Latif, DO  OZEMPIC, 0.25 OR 0.5 MG/DOSE, 2 MG/1.5ML SOPN Inject 0.5 mg into the skin every Monday.    [provider]  prazosin (MINIPRESS) 1 MG capsule Take 1 capsule (1 mg total) by mouth at bedtime. 04/03/18   Pennelope Bracken, MD  senna (SENOKOT) 8.6 MG TABS tablet Take 1 tablet (8.6 mg total) by mouth daily. 03/16/22   Sheikh, Omair Latif, DO  topiramate (TOPAMAX) 50 MG tablet Take 1 tablet (50 mg total) by mouth at bedtime. Patient not taking: Reported on 03/14/2022 04/03/18   Pennelope Bracken, MD  traZODone (DESYREL) 100 MG tablet Take 1 tablet (100 mg total) by mouth at bedtime and may repeat dose one time if needed. Patient taking differently: Take 100 mg by mouth at bedtime. 04/22/18   Derrill Center, NP      Allergies    Latex    Review of Systems   Review of Systems  Physical Exam Updated Vital Signs BP 102/64   Pulse 77   Temp 97.7 F (36.5 C) (Oral)   Resp 19   Ht '5\' 2"'$  (1.575 m)   Wt (!) 149.7 kg   SpO2 91%   BMI 60.36 kg/m  Physical Exam Vitals and nursing note reviewed.  Constitutional:      General: She is not in acute distress. HENT:     Head: Normocephalic and atraumatic.     Nose: Nose normal.  Eyes:     General: No scleral icterus. Cardiovascular:     Rate and Rhythm: Normal rate and regular rhythm.     Pulses: Normal pulses.     Heart sounds: Normal heart sounds.  Pulmonary:     Effort: Pulmonary effort is normal. No respiratory distress.     Breath sounds: No wheezing.     Comments: Lungs clear to auscultation.  Exam limited by body habitus Abdominal:     Palpations: Abdomen is soft.     Tenderness:  There is no abdominal tenderness.  Musculoskeletal:     Cervical back: Normal range of motion.     Right lower leg: No edema.     Left lower leg: No edema.     Comments: Right knee with discomfort with range of motion.  No focal bony tenderness.  No swelling or ballottement  No unilateral or bilateral leg swelling.  No calf tenderness  Skin:    General: Skin is warm and dry.     Capillary Refill: Capillary refill takes less than 2 seconds.  Neurological:     Mental Status: She is alert. Mental status is at baseline.  Psychiatric:        Mood and Affect: Mood normal.        Behavior: Behavior normal.     ED Results / Procedures / Treatments   Labs (all labs ordered are listed, but only abnormal results are displayed) Labs Reviewed  LIPID PANEL - Abnormal; Notable for the following components:      Result Value   Triglycerides 213 (*)    HDL 37 (*)    VLDL 43 (*)    All other components within normal limits  BASIC METABOLIC PANEL - Abnormal; Notable for the following components:   Glucose, Bld 114 (*)    Calcium 8.2 (*)    Anion gap 3 (*)    All other components within normal limits  CBC - Abnormal; Notable for the following components:   MCV 100.5 (*)    All other components within normal limits  HEMOGLOBIN A1C  I-STAT BETA HCG BLOOD, ED ([facility], [facility], [facility] ONLY)  TROPONIN I (HIGH SENSITIVITY)  TROPONIN I (HIGH SENSITIVITY)    EKG EKG Interpretation  Date/Time:  Monday April 03 2022 11:15:21 EST Ventricular Rate:  78 PR Interval:  173 QRS Duration: 87 QT Interval:  378 QTC Calculation: 431 R Axis:   268 Text Interpretation: Sinus rhythm Left anterior fascicular block Abnormal R-wave progression, late transition No significant change was found Confirmed by Ezequiel Essex [id number]) on 04/03/2022 12:05:47 PM  Radiology DG Knee Complete 4 Views Right  Result Date: 04/03/2022 CLINICAL DATA:  Acute pain. EXAM: RIGHT KNEE - COMPLETE 4+ VIEW COMPARISON:  None Available.  FINDINGS: No visible effusion. Osteoarthritis of the patellofemoral joint. Weight-bearing compartments appear normal. Incidental osteochondroma of the lateral metaphyseal region of the femur. These can be associated with local pain syndromes. IMPRESSION: No acute finding. 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 These can be associated with local pain syndromes. Electronically Signed   By: Nelson Chimes M.D.   On: 04/03/2022 11:15   DG Chest 2 View  Result Date: 04/03/2022 CLINICAL DATA:  Diarrhea.  Vomiting. EXAM: CHEST - 2 VIEW COMPARISON:  03/30/2022 FINDINGS: Cardiomegaly. Pulmonary venous hypertension without frank edema. The right lung is clear. Question retrocardiac density on the left that could indicate left lower lobe infiltrate. Ordinary degenerative changes affect the spine. IMPRESSION: Cardiomegaly. Pulmonary venous hypertension without frank edema. Question retrocardiac density on the left that could indicate left lower lobe infiltrate. Electronically Signed   By: Nelson Chimes M.D.   On: 04/03/2022 11:14    Procedures Procedures    Medications Ordered in ED Medications  acetaminophen (TYLENOL) tablet 650 mg (650 mg Oral Given 04/02/22 1948)  alum & mag hydroxide-simeth (MAALOX/MYLANTA) 200-200-20 MG/5ML suspension 30 mL (has no administration in time range)  magnesium hydroxide (MILK OF MAGNESIA) suspension 30 mL (has no administration in time range)  traZODone (DESYREL) tablet 50 mg (50 mg Oral Given 03/31/22 2129)  prazosin (MINIPRESS) capsule 1 mg (1 mg Oral Given 04/02/22 2115)  gabapentin (NEURONTIN) capsule 800 mg (800 mg Oral Given 04/03/22 1234)  OLANZapine zydis (ZYPREXA) disintegrating tablet 5 mg (5 mg Oral Given 04/01/22 0909)    And  LORazepam (ATIVAN) tablet 1 mg (has no administration in time range)    And  ziprasidone (GEODON) injection 20 mg (has no administration in time range)  albuterol (VENTOLIN HFA) 108 (90  Base) MCG/ACT inhaler 2 puff (has no administration in time range)  amLODipine (NORVASC) tablet 5 mg (5 mg Oral Given 04/03/22 0813)  docusate sodium (COLACE) capsule 200 mg (200 mg Oral Given 04/03/22 0813)  hydrOXYzine (ATARAX) tablet 50 mg (50 mg Oral Given 04/02/22 1948)  lisinopril (ZESTRIL) tablet 10 mg (0 mg Oral Duplicate 76/2/26 3335)  Semaglutide(0.25 or 0.'5MG'$ /DOS) SOPN 0.5 mg (has no administration in time range)  traZODone (DESYREL) tablet 100 mg (100 mg Oral Given 04/02/22 2114)  mometasone-formoterol (DULERA) 100-5 MCG/ACT inhaler 2 puff (2 puffs Inhalation Given 04/03/22 0816)  furosemide (LASIX) tablet 40 mg (40 mg Oral Given 04/03/22 0813)  lisinopril (ZESTRIL) tablet 10 mg (10 mg Oral Given 04/03/22 0814)  ARIPiprazole (ABILIFY) tablet 10 mg (10 mg Oral Given 04/03/22 0813)  melatonin tablet 5 mg (5 mg Oral Given 04/02/22 2115)  FLUoxetine (PROZAC) capsule 40 mg (40 mg Oral Given 04/03/22 0818)  acetaminophen (TYLENOL) tablet 1,000 mg (1,000 mg Oral Given 04/03/22 1100)    ED Course/ Medical Decision Making/ A&P                           Medical Decision Making Amount and/or Complexity of Data Reviewed Labs: ordered. Radiology: ordered.  Risk OTC drugs.   This patient presents to the ED for concern of knee pain, chest pain, body aches, this involves a number of treatment options, and is a complaint that carries with it a moderate risk of complications and morbidity. A differential diagnosis was considered for the patient's symptoms which is discussed below:   The emergent causes of chest pain include: Acute coronary syndrome, tamponade, pericarditis/myocarditis, aortic dissection, pulmonary embolism, tension pneumothorax, pneumonia, and esophageal rupture.  Her chest pain is perhaps related to her frequent cocaine use.  I do not believe the patient has an emergent cause of chest pain, other urgent/non-acute considerations include, but are not limited to: chronic angina,  aortic stenosis, cardiomyopathy, mitral valve prolapse, pulmonary hypertension, aortic insufficiency, right ventricular hypertrophy, pleuritis, bronchitis, pneumothorax, tumor, gastroesophageal reflux disease (GERD), esophageal spasm, Mallory-Weiss syndrome, peptic ulcer disease, pancreatitis, functional gastrointestinal pain, cervical or thoracic disk disease or arthritis, shoulder arthritis, costochondritis, subacromial bursitis, anxiety or panic attack, herpes zoster, breast disorders, chest wall tumors, thoracic outlet syndrome, mediastinitis.    Co morbidities: Discussed in HPI   Brief History:  Patient with cocaine use, heroin use, alcohol use Patient is a 41 year old female presented emergency room today brought [facility] after she experienced an episode of chest pain.  She states that it is sternal nonradiating nonexertional nonpleuritic.  She also complains of "full body pain "she states that she feels achy and fatigued.  She states she has had some pain in her right knee which she states is her primary issue.  She states that earlier she sat down on the ground and then in attempting to stand up got her right foot underneath her and then smacked her right knee on the ground well over balancing and falling forward slightly.  She states that her knee is aching significantly.  She does not take any medications for symptoms  [facility].    EMR reviewed including pt PMHx, past surgical history and past visits to ER.   See HPI for more details   Lab Tests:   I ordered and independently interpreted labs. Labs notable for Troponin x2 obtained, first troponin is 3, second opponent 3 8, CBC unremarkable BMP unremarkable  Imaging Studies:  Abnormal findings. I personally reviewed all imaging studies. Imaging notable for Knee  pain with benign bony tumor finding.  I discussed this with patient  who understands need for follow-up with PCP.  Chest x-ray unremarkable per my evaluation however will provide patient with doxycycline given possible infiltrate.   Cardiac Monitoring:  The patient was maintained on a cardiac monitor.  I personally viewed and interpreted the cardiac monitored which showed an underlying rhythm of: NSR EKG non-ischemic   Medicines ordered:  I ordered medication including tylenol  for CP/knee pain Reevaluation of the patient after these medicines showed that the patient improved I have reviewed the patients home medicines and have made adjustments as needed   Critical Interventions:     Consults/Attending Physician      Reevaluation:  After the interventions noted above I re-evaluated patient and found that they have :improved   Social Determinants of Health:      Problem List / ED Course:  Chest x-ray work-up reassuring.  Doubt pneumonia however given that there is a questionable retrocardiac density will treat patient with doxycycline. Bony abnormality of right knee.  Patient follow-up with PCP regarding this.   Dispostion:  After consideration of the diagnostic results and the patients response to treatment, I feel that the patent would benefit from doxycyline and follow up with PCP.    Final Clinical Impression(s) / ED Diagnoses Final diagnoses:  Chest pain, unspecified type  Recurrent pain of right knee    Rx / DC Orders ED Discharge Orders     None         Tedd Sias, [place] 04/03/22 1538    Ezequiel Essex, MD 04/03/22 1641

## 2022-04-04 NOTE — Progress Notes (Signed)

## 2022-04-04 NOTE — Progress Notes (Cosign Needed Addendum)
[facility] MD Progress Note  04/04/2022 7:20 PM Sarah Cervantes  MRN:  [id number] Subjective:    Sarah Cervantes is a 41 year old, Caucasian female with a past psychiatric history significant for PTSD, bipolar disorder, polysubstance abuse, and generalized anxiety disorder who was involuntarily admitted to [facility] due to suicidal ideations with a plan.   Patient was assessed by Ileene Musa, PA and Dr. Caswell Corwin for reevaluation.  Patient is currently being managed on the following psychiatric medications:  Abilify 10 mg daily Prozac 40 mg daily Trazodone 100 mg at bedtime as needed Prazosin 1 mg at bedtime Melatonin 5 mg at bedtime Hydroxyzine 50 mg 3 times daily as needed Gabapentin 800 mg 3 times daily  Agitation protocol             -Olanzapine Zydis 5 mg every 8 hours as needed for agitation AND             -Lorazepam 1 mg as needed for anxiety and severe agitation AND             -Ziprasidone 20 mg injection as needed for agitation  Patient reports that she is feeling a lot better but still complains of right knee pain.  Patient rates her depression as 6 out of 10 with 10 being most severe.  Patient rates her anxiety at 3 out of 10.  Patient main stressors today are her right knee pain and family.  Patient denies suicidal or homicidal ideations.  She reports seeing shadows but states that she is always seeing shadows of the little girl.  Patient denies paranoia or delusional thoughts.  Patient reports that both her sleep and appetite have been good.  Patient is laying in bed, casually dressed, alert and oriented x 4.  Patient is calm, cooperative, and fully engaged in conversation during the encounter.  Patient maintains fair eye contact.  Patient's speech is clear, coherent, and with normal rate.  Patient's thought process is logical.  Patient's thought content is coherent.  Patient's mood is depressed and anxious but endorses doing a lot better.  Patient does  not appear to be responding to internal/external stimuli.  Principal Problem: Bipolar disorder, curr episode depressed, severe, w/psychotic features ([place]) Diagnosis: Principal Problem:   Bipolar disorder, curr episode depressed, severe, w/psychotic features ([place]) Active Problems:   PTSD (post-traumatic stress disorder)   Polysubstance abuse ([place])   Insomnia   Anxiety state  Total Time spent with patient: 15 minutes  Past Psychiatric History:  Bipolar disorder PTSD Polysubstance abuse  Past Medical History:  Past Medical History:  Diagnosis Date   Cocaine abuse, daily use ([place]) 03/23/2018   ETOH abuse 03/23/2018   Heroin abuse ([place]) 03/23/2018   History reviewed. No pertinent surgical history. Family History: History reviewed. No pertinent family history. Family Psychiatric  History:  Expand All Collapse All  [facility] MD Progress Note   04/03/2022 5:18 PM Sarah Cervantes  MRN:  [id number] Subjective:     Sarah Cervantes is a 41 year old, Caucasian female with a past psychiatric history significant for PTSD, bipolar disorder, polysubstance abuse, and generalized anxiety disorder who was involuntarily admitted to [facility] due to suicidal ideations with a plan.   Patient was assessed by Ileene Musa, PA and Dr. Caswell Corwin for reevaluation.  Patient is currently being managed on the following psychiatric medications:   Abilify 10 mg daily Prozac 40 mg daily Trazodone 100 mg at bedtime  Prazosin 1 mg at bedtime Melatonin 5 mg at bedtime Hydroxyzine 50  mg 3 times daily as needed   Agitation protocol             -Olanzapine Zydis 5 mg every 8 hours as needed for agitation AND             -Lorazepam 1 mg as needed for anxiety and severe agitation AND             -Ziprasidone 20 mg injection as needed for agitation   Patient was assessed in the morning and reported not feeling well.  She reported having diarrhea as well as throwing up roughly 45 minutes  prior to the encounter.  She was unsure if fever but endorsed body aches, fatigue.  She further endorsed chest pain, difficulty breathing, jaw pain and numbness in her left arm.     Patient was subsequently admitted to [name] ED for medical clearance and report with given to EDP.  While patient was in the ED, imaging was performed with findings significant right knee pain with a benign bony tumor and unremarkable chest x-ray.  Patient was provided doxycycline for possible infiltrate.  EKG was also performed with results consistent with normal sinus rhythm/nonischemic.  Patient was transferred back to [facility] once medically cleared.   During her assessment, patient continues to endorse depression she rates a 7 out of 10 with 10 being severe.  Patient endorses anxiety and rates her anxiety as 5 out of 10.  She continues to endorse suicidal ideations with a plan to overdose.  She denies homicidal ideations.  She endorses visual hallucinations characterized by seeing shadows and states that she is always seeing shadows growing up.  She denies paranoia or delusional thoughts.  She endorses poor sleep and states that she received 3 hours of sleep the previous night.  Patient endorses good appetite. Patient continues to endorse knee pain and is requesting pain management.   Patient is casually dressed, alert and oriented x 4.  Patient is calm, cooperative, and fully engaged in conversation during the encounter.  Patient maintains fair eye contact.  Patient's speech is clear, coherent, and with normal rate.  Patient's thought process is logical.  Patient's thought content is coherent.  Patient's mood is depressed and anxious with congruent affect.  Patient continues to endorse seeing shadows but does not appear to be responding to internal/external stimuli.   Principal Problem: Bipolar disorder, curr episode depressed, severe, w/psychotic features ([place]) Diagnosis: Principal Problem:   Bipolar disorder, curr  episode depressed, severe, w/psychotic features ([place]) Active Problems:   PTSD (post-traumatic stress disorder)   Polysubstance abuse ([place])   Insomnia   Anxiety state   Total Time spent with patient: 20 minutes   Past Psychiatric History:  Bipolar disorder PTSD Polysubstance abuse   Past Medical History:      Past Medical History:  Diagnosis Date   Cocaine abuse, daily use ([place]) 03/23/2018   ETOH abuse 03/23/2018   Heroin abuse ([place]) 03/23/2018   History reviewed. No pertinent surgical history. Family History: History reviewed. No pertinent family history. Family Psychiatric  History:  Patient reports that most of her family members have some type of psychiatric illness but was unsure of what they specifically had      Social History:  Social History   Substance and Sexual Activity  Alcohol Use Yes   Alcohol/week: 12.0 - 15.0 standard drinks of alcohol   Types: 12 - 15 Cans of beer per week   Comment: drinking daily for the past week  Social History   Substance and Sexual Activity  Drug Use Yes   Types: Cocaine    Social History   Socioeconomic History   Marital status: Single    Spouse name: Not on file   Number of children: Not on file   Years of education: Not on file   Highest education level: Not on file  Occupational History   Not on file  Tobacco Use   Smoking status: Former    Packs/day: 0.50    Years: 30.00    Total pack years: 15.00    Types: Cigarettes    Quit date: 01/12/2022    Years since quitting: 0.2   Smokeless tobacco: Never  Vaping Use   Vaping Use: Never used  Substance and Sexual Activity   Alcohol use: Yes    Alcohol/week: 12.0 - 15.0 standard drinks of alcohol    Types: 12 - 15 Cans of beer per week    Comment: drinking daily for the past week   Drug use: Yes    Types: Cocaine   Sexual activity: Yes    Birth control/protection: None  Other Topics Concern   Not on file  Social History Narrative   Not on file   Social  Determinants of Health   Financial Resource Strain: Not on file  Food Insecurity: Unknown (03/31/2022)   Hunger Vital Sign    Worried About Running Out of Food in the Last Year: Patient refused    Ran Out of Food in the Last Year: Patient refused  Recent Concern: [place] Present (03/15/2022)   Hunger Vital Sign    Worried About Charity fundraiser in the Last Year: Often true    Ran Out of Food in the Last Year: Often true  Transportation Needs: Unknown (03/31/2022)   PRAPARE - Hydrologist (Medical): Patient refused    Lack of Transportation (Non-Medical): Patient refused  Recent Concern: Transportation Needs - Unmet Transportation Needs (03/15/2022)   PRAPARE - Hydrologist (Medical): Yes    Lack of Transportation (Non-Medical): Yes  Physical Activity: Not on file  Stress: Not on file  Social Connections: Not on file   Additional Social History:    Sleep: Good  Appetite:  Good  Current Medications: Current Facility-Administered Medications  Medication Dose Route Frequency Provider Last Rate Last Admin   acetaminophen (TYLENOL) tablet 650 mg  650 mg Oral Q6H PRN Leevy-Johnson, Brooke A, NP   650 mg at 04/04/22 0339   albuterol (VENTOLIN HFA) 108 (90 Base) MCG/ACT inhaler 2 puff  2 puff Inhalation Q6H PRN Zeenat Jeanbaptiste E, PA       alum & mag hydroxide-simeth (MAALOX/MYLANTA) 200-200-20 MG/5ML suspension 30 mL  30 mL Oral Q4H PRN Leevy-Johnson, Brooke A, NP       amLODipine (NORVASC) tablet 5 mg  5 mg Oral Daily Andrue Dini E, PA   5 mg at 04/04/22 0826   ARIPiprazole (ABILIFY) tablet 10 mg  10 mg Oral Daily Ben Sanz E, PA   10 mg at 04/04/22 0826   docusate sodium (COLACE) capsule 200 mg  200 mg Oral Daily Shyann Hefner E, PA   200 mg at 04/04/22 0826   FLUoxetine (PROZAC) capsule 40 mg  40 mg Oral Daily Madge Therrien E, PA   40 mg at 04/04/22 0830   furosemide (LASIX) tablet 40 mg  40  mg Oral Daily Aradia Estey E, PA   40  mg at 04/04/22 0826   gabapentin (NEURONTIN) capsule 800 mg  800 mg Oral TID Leevy-Johnson, Brooke A, NP   800 mg at 04/04/22 1725   hydrOXYzine (ATARAX) tablet 50 mg  50 mg Oral TID PRN Winston Sobczyk E, PA   50 mg at 04/02/22 1948   ibuprofen (ADVIL) tablet 400 mg  400 mg Oral Q6H PRN Lincy Belles E, PA   400 mg at 04/04/22 1725   lisinopril (ZESTRIL) tablet 10 mg  10 mg Oral q AM Kerisha Goughnour E, PA   10 mg at 04/04/22 0638   OLANZapine zydis (ZYPREXA) disintegrating tablet 5 mg  5 mg Oral Q8H PRN Leevy-Johnson, Brooke A, NP   5 mg at 04/01/22 3557   And   LORazepam (ATIVAN) tablet 1 mg  1 mg Oral PRN Leevy-Johnson, Brooke A, NP       And   ziprasidone (GEODON) injection 20 mg  20 mg Intramuscular PRN Leevy-Johnson, Brooke A, NP       magnesium hydroxide (MILK OF MAGNESIA) suspension 30 mL  30 mL Oral Daily PRN Leevy-Johnson, Brooke A, NP       melatonin tablet 5 mg  5 mg Oral QHS Ardenia Stiner E, PA   5 mg at 04/03/22 2112   mometasone-formoterol (DULERA) 100-5 MCG/ACT inhaler 2 puff  2 puff Inhalation BID Massengill, Ovid Curd, MD   2 puff at 04/04/22 0827   pantoprazole (PROTONIX) EC tablet 40 mg  40 mg Oral Daily Solveig Fangman E, PA   40 mg at 04/04/22 0826   prazosin (MINIPRESS) capsule 1 mg  1 mg Oral QHS Leevy-Johnson, Brooke A, NP   1 mg at 04/03/22 2112   Semaglutide(0.25 or 0.'5MG'$ /DOS) SOPN 0.5 mg  0.5 mg Subcutaneous Q Mon Zaniya Mcaulay E, PA       traZODone (DESYREL) tablet 100 mg  100 mg Oral QHS Noni Stonesifer E, PA   100 mg at 04/03/22 2112   traZODone (DESYREL) tablet 50 mg  50 mg Oral QHS PRN Leevy-Johnson, Brooke A, NP   50 mg at 03/31/22 2129    Lab Results:  Results for orders placed or performed during the hospital encounter of 03/31/22 (from the past 48 hour(s))  Basic metabolic panel     Status: Abnormal   Collection Time: 04/03/22 10:48 AM  Result Value Ref Range   Sodium 136 135 - 145 mmol/L   Potassium 4.2 3.5 - 5.1  mmol/L   Chloride 106 98 - 111 mmol/L   CO2 27 22 - 32 mmol/L   Glucose, Bld 114 (H) 70 - 99 mg/dL    Comment: Glucose reference range applies only to samples taken after fasting for at least 8 hours.   BUN 14 6 - 20 mg/dL   Creatinine, Ser 0.77 0.44 - 1.00 mg/dL   Calcium 8.2 (L) 8.9 - 10.3 mg/dL   GFR, Estimated >60 >60 mL/min    Comment: (NOTE) Calculated using the CKD-EPI Creatinine Equation (2021)    Anion gap 3 (L) 5 - 15    Comment: Performed at [facility], [place] [address] [place], [place] [address]  CBC     Status: Abnormal   Collection Time: 04/03/22 10:48 AM  Result Value Ref Range   WBC 8.0 4.0 - 10.5 K/uL   RBC 4.39 3.87 - 5.11 MIL/uL   Hemoglobin 13.7 12.0 - 15.0 g/dL   HCT 44.1 36.0 - 46.0 %   MCV 100.5 (H) 80.0 - 100.0 fL   MCH  31.2 26.0 - 34.0 pg   MCHC 31.1 30.0 - 36.0 g/dL   RDW 14.5 11.5 - 15.5 %   Platelets 294 150 - 400 K/uL   nRBC 0.0 0.0 - 0.2 %    Comment: Performed at [facility], [place] [address], [place], [place] [address]  Troponin I (High Sensitivity)     Status: None   Collection Time: 04/03/22 10:48 AM  Result Value Ref Range   Troponin I (High Sensitivity) 3 <18 ng/L    Comment: (NOTE) Elevated high sensitivity troponin I (hsTnI) values and significant  changes across serial measurements may suggest ACS but many other  chronic and acute conditions are known to elevate hsTnI results.  Refer to the "Links" section for chest pain algorithms and additional  guidance. Performed at [facility], [place] [address], [place], [place] [address]   Troponin I (High Sensitivity)     Status: None   Collection Time: 04/03/22  1:15 PM  Result Value Ref Range   Troponin I (High Sensitivity) 3 <18 ng/L    Comment: (NOTE) Elevated high sensitivity troponin I (hsTnI) values and significant  changes across serial measurements may suggest ACS but many other  chronic and acute conditions are known to  elevate hsTnI results.  Refer to the "Links" section for chest pain algorithms and additional  guidance. Performed at [facility], [place] [address], [place], [place] [address]     Blood Alcohol level:  Lab Results  Component Value Date   [facility] <10 03/30/2022   ETH <10 71/69/6789    Metabolic Disorder Labs: Lab Results  Component Value Date   HGBA1C 5.5 04/02/2022   MPG 111.15 04/02/2022   MPG 122.63 03/15/2022   No results found for: "PROLACTIN" Lab Results  Component Value Date   CHOL 159 04/02/2022   TRIG 213 (H) 04/02/2022   HDL 37 (L) 04/02/2022   CHOLHDL 4.3 04/02/2022   VLDL 43 (H) 04/02/2022   LDLCALC 79 04/02/2022    Physical Findings: AIMS:  , ,  ,  ,    CIWA:    COWS:     Musculoskeletal: Strength & Muscle Tone: within normal limits Gait & Station: unsteady, patient is a fall risk Patient leans: N/A  Psychiatric Specialty Exam:  Presentation  General Appearance:  Appropriate for Environment; Casual  Eye Contact: Fair  Speech: Clear and Coherent; Normal Rate  Speech Volume: Normal  Handedness: Right   Mood and Affect  Mood: Depressed; Anxious  Affect: Congruent   Thought Process  Thought Processes: Coherent  Descriptions of Associations:Intact  Orientation:Full (Time, Place and Person)  Thought Content:Logical  History of Schizophrenia/Schizoaffective disorder:No  Duration of Psychotic Symptoms:N/A  Hallucinations:Hallucinations: None Description of Visual Hallucinations: Patient reports that they see shadows  Ideas of Reference:None  Suicidal Thoughts:Suicidal Thoughts: No SI Active Intent and/or Plan: With Intent; With Plan  Homicidal Thoughts:Homicidal Thoughts: No   Sensorium  Memory: Immediate Good; Recent Fair; Remote Fair  Judgment: Fair  Insight: Fair   Community education officer  Concentration: Good  Attention Span: Good  Recall: [place] of  Knowledge: Fair  Language: Good   Psychomotor Activity  Psychomotor Activity: Psychomotor Activity: Normal   Assets  Assets: Communication Skills; Desire for Improvement   Sleep  Sleep: Sleep: Good Number of Hours of Sleep: 3    Physical Exam: Physical Exam Constitutional:      Appearance: Normal appearance.  HENT:     Head: Normocephalic and atraumatic.  Nose: Nose normal.     Mouth/Throat:     Mouth: Mucous membranes are moist.  Eyes:     Extraocular Movements: Extraocular movements intact.     Pupils: Pupils are equal, round, and reactive to light.  Cardiovascular:     Rate and Rhythm: Normal rate and regular rhythm.  Pulmonary:     Effort: Pulmonary effort is normal.     Breath sounds: Normal breath sounds.  Abdominal:     General: Abdomen is flat.  Musculoskeletal:        General: Normal range of motion.     Cervical back: Normal range of motion and neck supple.  Skin:    General: Skin is warm and dry.  Neurological:     General: No focal deficit present.     Mental Status: She is alert and oriented to person, place, and time.  Psychiatric:        Attention and Perception: Attention and perception normal. She does not perceive auditory or visual hallucinations.        Mood and Affect: Mood is anxious and depressed.        Speech: Speech normal.        Behavior: Behavior normal. Behavior is cooperative.        Thought Content: Thought content normal. Thought content is not paranoid or delusional. Thought content does not include homicidal or suicidal ideation.        Cognition and Memory: Cognition and memory normal.        Judgment: Judgment normal.    Review of Systems  Constitutional: Negative.   HENT: Negative.    Eyes: Negative.   Respiratory: Negative.    Cardiovascular: Negative.   Gastrointestinal: Negative.   Musculoskeletal:  Positive for joint pain.  Skin: Negative.   Neurological: Negative.   Psychiatric/Behavioral:  Positive  for depression and substance abuse. Negative for hallucinations and suicidal ideas. The patient is nervous/anxious. The patient does not have insomnia.    Blood pressure (!) 103/50, pulse 75, temperature 98.2 F (36.8 C), temperature source Oral, resp. rate 18, height '5\' 2"'$  (1.575 m), weight (!) 149.7 kg, SpO2 95 %. Body mass index is 60.36 kg/m.   Treatment Plan Summary:  Daily contact with patient to assess and evaluate symptoms and progress in treatment and Medication management  Plan:   Although patient continues to endorse depression and anxiety, she endorses overall improvements in her mood.  Patient's only stressors today are her right knee pain and outside stressors.  Patient is interested in residential treatment facility following discharge from this facility.  Patient informed provider that her aunt was able to get patient enrolled into [facility].  She describes the facility as a substance abuse/transitional home.  Provider to keep patient on the same psychiatric regimen.  Safety and Monitoring: Voluntary admission to inpatient psychiatric unit for safety, stabilization and treatment Daily contact with patient to assess and evaluate symptoms and progress in treatment Patient's case to be discussed in multi-disciplinary team meeting Observation Level : q15 minute checks Vital signs: q12 hours Precautions: suicide, elopement, and assault   Diagnosis: Principal Problem:   Bipolar disorder, curr episode depressed, severe, w/psychotic features ([place]) Active Problems:   PTSD (post-traumatic stress disorder)   Polysubstance abuse ([place])   Insomnia   Anxiety state  #Bipolar disorder, current episode depressed, severe, with psychotic features -Continue Abilify 10 mg daily for depressed mood and mood stability -Continue Prozac 40 mg daily for depressed mood   Agitation  protocol             -Olanzapine Zydis 5 mg every 8 hours as needed for agitation AND              -Lorazepam 1 mg as needed for anxiety and severe agitation AND             -Ziprasidone 20 mg injection as needed for agitation  #Insomnia Patient reports that she had a sleep study formed prior to admission that confirmed a diagnosis of sleep apnea. -Continue trazodone 100 mg at bedtime to as needed -Continue prazosin 1 mg at bedtime -Continue melatonin 5 mg daily for sleep  #Anxiety -Continue Hydroxyzine 50 mg 3 times daily as needed for anxiety -Continue gabapentin 800 mg 3 times daily for anxiety   As needed medications: -Patient to continue taking Tylenol 650 mg every 6 hours as needed for mild pain -Patient to continue taking Maalox/Mylanta 30 mL every 4 hours as needed for indigestion -Patient to continue taking Milk of Magnesia 30 mL as needed for mild constipation  Discharge Planning: Social work and case management to assist with discharge planning and identification of hospital follow-up needs prior to discharge Estimated LOS: 5-7 days Discharge Concerns: Need to establish a safety plan; Medication compliance and effectiveness Discharge Goals: Return home with outpatient referrals for mental health follow-up including medication management/psychotherapy    I certify that inpatient services furnished can reasonably be expected to improve the patient's condition.  Malachy Mood, PA 04/04/2022, 7:20 PM

## 2022-04-04 NOTE — Progress Notes (Signed)
Adult Psychoeducational Group Note  Date:  04/04/2022 Time:  1:16 PM  Pt did not attend the orientation group.

## 2022-04-04 NOTE — Progress Notes (Signed)
Pt rates depression 2/10 and anxiety 1/10. Pt reports a good appetite, and reports knee pain 8/10. Pt given scheduled meds. Pt endorses seeing shadows but denies SI/HI and verbally contracts for safety. Provided support and encouragement. Pt safe on the unit. Q 15 minute safety checks continued.

## 2022-04-05 ENCOUNTER — Encounter (HOSPITAL_COMMUNITY): Payer: Self-pay

## 2022-04-05 DIAGNOSIS — F315 Bipolar disorder, current episode depressed, severe, with psychotic features: Principal | ICD-10-CM

## 2022-04-05 NOTE — Group Note (Signed)
Recreation Therapy Group Note   Group Topic:Team Building  Group Date: 04/05/2022 Start Time: 0930 End Time: 0955 Facilitators: Emmary Culbreath-McCall, LRT,CTRS Location: [address] Dayroom   Goal Area(s) Addresses:  Patient will effectively work with peer towards shared goal.  Patient will identify skills used to make activity successful.  Patient will identify how skills used during activity can be used to reach post d/c goals.   Group Description: Landing Pad. In teams of 3-5, patients were given 12 plastic drinking straws and an equal length of masking tape. Using the materials provided, patients were asked to build a landing pad to catch a golf ball dropped from approximately 5 feet in the air. All materials were required to be used by the team in their design. LRT facilitated post-activity discussion.   Affect/Mood: N/A   Participation Level: Did not attend    Clinical Observations/Individualized Feedback:     Plan: Continue to engage patient in RT group sessions 2-3x/week.   Maribel Luis-McCall, LRT,CTRS 04/05/2022 10:53 AM

## 2022-04-05 NOTE — Plan of Care (Signed)
Nurse discussed anxiety, depression and coping skills with patient.  

## 2022-04-05 NOTE — Progress Notes (Signed)
D: Pt alert and oriented. Pt rates depression 7/10 and anxiety 7/10.  Pt denies experiencing any SI/HI, or AVH at this time.   A: Scheduled medications administered to pt, per MD orders. Support and encouragement provided. Frequent verbal contact made. Routine safety checks conducted q15 minutes.   R: No adverse drug reactions noted. Pt verbally contracts for safety at this time. Pt complaint with medications and treatment plan. Pt interacts well with others on the unit. Pt remains safe at this time. Will continue to monitor.

## 2022-04-05 NOTE — [facility] Group Notes (Signed)
Patient did not attend the Therapeutic group.

## 2022-04-05 NOTE — Progress Notes (Signed)
[facility] MD Progress Note  04/05/2022 7:33 PM Sarah Cervantes  MRN:  [id number] Subjective:    Sarah Cervantes is a 41 year old, Caucasian female with a past psychiatric history significant for PTSD, bipolar disorder, polysubstance abuse, and generalized anxiety disorder who was involuntarily admitted to [facility] due to suicidal ideations with a plan.   Patient was assessed by Ileene Musa, PA and Dr. Caswell Corwin for reevaluation.  Patient is currently being managed on the following psychiatric medications:  Abilify 10 mg daily Prozac 40 mg daily Trazodone 100 mg at bedtime as needed Prazosin 1 mg at bedtime Melatonin 5 mg at bedtime Hydroxyzine 50 mg 3 times daily as needed Gabapentin 800 mg 3 times daily   Agitation protocol             -Olanzapine Zydis 5 mg every 8 hours as needed for agitation AND             -Lorazepam 1 mg as needed for anxiety and severe agitation AND             -Ziprasidone 20 mg injection as needed for agitation  Patient continues to endorse knee pain she rates as 6 out of 10 with 10 being most severe.  Despite her knee pain, patient states that her mood has been pretty good.  Patient rates her depression a 5 out of 10 with 10 being most severe.  Patient endorses anxiety and rates her anxiety at 3 out of 10.  Patient denies any new stressors at this time.  Patient informed provider that she would not be able to enroll into [facility] due to not having an ID.  She informed the provider that she has a friend that she could be discharged to tomorrow by the name of Sarah Cervantes.  Patient denies suicidal or homicidal ideations.  Patient denies active auditory or visual hallucinations and does not appear to be responding to internal/external stimuli.  Patient denies paranoia or delusional thoughts.  Patient reports that her sleep has been better than earlier this week.  Patient endorses good appetite.  Patient is casually dressed, alert and  oriented x 4.  Patient is calm, cooperative, and fully engaged in conversation during the encounter.  Patient maintains fair eye contact.  Patient's speech is clear, coherent, and with normal rate.  Patient's thought process is logical.  Patient's thought content is coherent.  Patient's mood is depressed and anxious with congruent affect.  Patient continues to endorse seeing shadows but does not appear to be responding to internal/external stimuli.   Principal Problem: Bipolar disorder, curr episode depressed, severe, w/psychotic features ([place]) Diagnosis: Principal Problem:   Bipolar disorder, curr episode depressed, severe, w/psychotic features ([place]) Active Problems:   PTSD (post-traumatic stress disorder)   Polysubstance abuse ([place])   Insomnia   Anxiety state  Total Time spent with patient: 15 minutes  Past Psychiatric History:  Bipolar disorder PTSD Polysubstance abuse  Past Medical History:  Past Medical History:  Diagnosis Date   Cocaine abuse, daily use ([place]) 03/23/2018   ETOH abuse 03/23/2018   Heroin abuse ([place]) 03/23/2018   History reviewed. No pertinent surgical history. Family History: History reviewed. No pertinent family history. Family Psychiatric  History:  Patient reports that most of her family members have some type of psychiatric illness but was unsure of what they specifically had     Social History:  Social History   Substance and Sexual Activity  Alcohol Use Yes   Alcohol/week: 12.0 - 15.0 standard drinks  of alcohol   Types: 12 - 15 Cans of beer per week   Comment: drinking daily for the past week     Social History   Substance and Sexual Activity  Drug Use Yes   Types: Cocaine    Social History   Socioeconomic History   Marital status: Single    Spouse name: Not on file   Number of children: Not on file   Years of education: Not on file   Highest education level: Not on file  Occupational History   Not on file  Tobacco Use   Smoking  status: Former    Packs/day: 0.50    Years: 30.00    Total pack years: 15.00    Types: Cigarettes    Quit date: 01/12/2022    Years since quitting: 0.2   Smokeless tobacco: Never  Vaping Use   Vaping Use: Never used  Substance and Sexual Activity   Alcohol use: Yes    Alcohol/week: 12.0 - 15.0 standard drinks of alcohol    Types: 12 - 15 Cans of beer per week    Comment: drinking daily for the past week   Drug use: Yes    Types: Cocaine   Sexual activity: Yes    Birth control/protection: None  Other Topics Concern   Not on file  Social History Narrative   Not on file   Social Determinants of Health   Financial Resource Strain: Not on file  Food Insecurity: Unknown (03/31/2022)   Hunger Vital Sign    Worried About Running Out of Food in the Last Year: Patient refused    Ran Out of Food in the Last Year: Patient refused  Recent Concern: [place] Present (03/15/2022)   Hunger Vital Sign    Worried About Charity fundraiser in the Last Year: Often true    Ran Out of Food in the Last Year: Often true  Transportation Needs: Unknown (03/31/2022)   [place] - Hydrologist (Medical): Patient refused    Lack of Transportation (Non-Medical): Patient refused  Recent Concern: Transportation Needs - Unmet Transportation Needs (03/15/2022)   [place] - Hydrologist (Medical): Yes    Lack of Transportation (Non-Medical): Yes  Physical Activity: Not on file  Stress: Not on file  Social Connections: Not on file   Additional Social History:   Sleep: Good  Appetite:  Good  Current Medications: Current Facility-Administered Medications  Medication Dose Route Frequency Provider Last Rate Last Admin   acetaminophen (TYLENOL) tablet 650 mg  650 mg Oral Q6H PRN Leevy-Johnson, Brooke A, NP   650 mg at 04/05/22 0313   albuterol (VENTOLIN HFA) 108 (90 Base) MCG/ACT inhaler 2 puff  2 puff Inhalation Q6H PRN Kaisy Severino,  Randie Tallarico E, PA       alum & mag hydroxide-simeth (MAALOX/MYLANTA) 200-200-20 MG/5ML suspension 30 mL  30 mL Oral Q4H PRN Leevy-Johnson, Brooke A, NP       amLODipine (NORVASC) tablet 5 mg  5 mg Oral Daily Aquarius Latouche E, PA   5 mg at 04/05/22 0727   ARIPiprazole (ABILIFY) tablet 10 mg  10 mg Oral Daily Rainee Sweatt E, PA   10 mg at 04/05/22 0727   docusate sodium (COLACE) capsule 200 mg  200 mg Oral Daily Orris Perin E, PA   200 mg at 04/05/22 0728   FLUoxetine (PROZAC) capsule 40 mg  40 mg Oral Daily Doye Montilla, Terese Door, PA  40 mg at 04/05/22 0727   furosemide (LASIX) tablet 40 mg  40 mg Oral Daily Jaisean Monteforte E, PA   40 mg at 04/05/22 0726   gabapentin (NEURONTIN) capsule 800 mg  800 mg Oral TID Leevy-Johnson, Brooke A, NP   800 mg at 04/05/22 1651   hydrOXYzine (ATARAX) tablet 50 mg  50 mg Oral TID PRN Matheu Ploeger E, PA   50 mg at 04/02/22 1948   ibuprofen (ADVIL) tablet 400 mg  400 mg Oral Q6H PRN Caylin Raby E, PA   400 mg at 04/04/22 1725   lisinopril (ZESTRIL) tablet 10 mg  10 mg Oral q AM Chizuko Trine E, PA   10 mg at 04/05/22 0606   OLANZapine zydis (ZYPREXA) disintegrating tablet 5 mg  5 mg Oral Q8H PRN Leevy-Johnson, Brooke A, NP   5 mg at 04/01/22 0272   And   LORazepam (ATIVAN) tablet 1 mg  1 mg Oral PRN Leevy-Johnson, Brooke A, NP       And   ziprasidone (GEODON) injection 20 mg  20 mg Intramuscular PRN Leevy-Johnson, Brooke A, NP       magnesium hydroxide (MILK OF MAGNESIA) suspension 30 mL  30 mL Oral Daily PRN Leevy-Johnson, Brooke A, NP       melatonin tablet 5 mg  5 mg Oral QHS Savier Trickett E, PA   5 mg at 04/04/22 2112   mometasone-formoterol (DULERA) 100-5 MCG/ACT inhaler 2 puff  2 puff Inhalation BID Massengill, Ovid Curd, MD   2 puff at 04/05/22 0726   pantoprazole (PROTONIX) EC tablet 40 mg  40 mg Oral Daily Safiyyah Vasconez E, PA   40 mg at 04/05/22 0727   prazosin (MINIPRESS) capsule 1 mg  1 mg Oral QHS Leevy-Johnson, Brooke A, NP   1 mg at 04/04/22 2112    Semaglutide(0.25 or 0.'5MG'$ /DOS) SOPN 0.5 mg  0.5 mg Subcutaneous Q Mon General Wearing E, PA       traZODone (DESYREL) tablet 100 mg  100 mg Oral QHS Rae Plotner E, PA   100 mg at 04/04/22 2115   traZODone (DESYREL) tablet 50 mg  50 mg Oral QHS PRN Leevy-Johnson, Brooke A, NP   50 mg at 03/31/22 2129    Lab Results: No results found for this or any previous visit (from the past 48 hour(s)).  Blood Alcohol level:  Lab Results  Component Value Date   ETH <10 03/30/2022   ETH <10 53/66/4403    Metabolic Disorder Labs: Lab Results  Component Value Date   HGBA1C 5.5 04/02/2022   MPG 111.15 04/02/2022   MPG 122.63 03/15/2022   No results found for: "PROLACTIN" Lab Results  Component Value Date   CHOL 159 04/02/2022   TRIG 213 (H) 04/02/2022   HDL 37 (L) 04/02/2022   CHOLHDL 4.3 04/02/2022   VLDL 43 (H) 04/02/2022   LDLCALC 79 04/02/2022    Physical Findings: AIMS:  , ,  ,  ,    CIWA:    COWS:     Musculoskeletal: Strength & Muscle Tone: within normal limits Gait & Station: normal Patient leans: N/A  Psychiatric Specialty Exam:  Presentation  General Appearance:  Appropriate for Environment; Casual  Eye Contact: Fair  Speech: Clear and Coherent; Normal Rate  Speech Volume: Normal  Handedness: Right   Mood and Affect  Mood: Anxious; Depressed  Affect: Congruent   Thought Process  Thought Processes: Coherent  Descriptions of Associations:Intact  Orientation:Full (Time, Place and Person)  Thought Content:Logical  History of Schizophrenia/Schizoaffective disorder:No  Duration of Psychotic Symptoms:N/A  Hallucinations:Hallucinations: None  Ideas of Reference:None  Suicidal Thoughts:Suicidal Thoughts: No  Homicidal Thoughts:Homicidal Thoughts: No   Sensorium  Memory: Immediate Good; Recent Fair; Remote Fair  Judgment: Fair  Insight: Fair   Community education officer  Concentration: Good  Attention  Span: Good  Recall: [place] of Knowledge: Fair  Language: Good   Psychomotor Activity  Psychomotor Activity: Psychomotor Activity: Normal   Assets  Assets: Communication Skills; Desire for Improvement   Sleep  Sleep: Sleep: Good    Physical Exam: Physical Exam Constitutional:      Appearance: Normal appearance.  HENT:     Head: Normocephalic and atraumatic.     Nose: Nose normal.     Mouth/Throat:     Mouth: Mucous membranes are dry.  Eyes:     Extraocular Movements: Extraocular movements intact.     Pupils: Pupils are equal, round, and reactive to light.  Cardiovascular:     Rate and Rhythm: Normal rate and regular rhythm.  Pulmonary:     Effort: Pulmonary effort is normal.     Breath sounds: Normal breath sounds.  Abdominal:     General: Abdomen is flat.  Musculoskeletal:        General: Normal range of motion.     Cervical back: Normal range of motion and neck supple.  Skin:    General: Skin is warm and dry.  Neurological:     General: No focal deficit present.     Mental Status: She is alert and oriented to person, place, and time.  Psychiatric:        Attention and Perception: Attention and perception normal. She does not perceive auditory or visual hallucinations.        Mood and Affect: Mood is anxious and depressed.        Speech: Speech normal.        Behavior: Behavior normal. Behavior is cooperative.        Thought Content: Thought content normal. Thought content is not paranoid or delusional. Thought content does not include homicidal or suicidal ideation.        Cognition and Memory: Cognition and memory normal.        Judgment: Judgment normal.    Review of Systems  Constitutional: Negative.   HENT: Negative.    Eyes: Negative.   Respiratory: Negative.    Cardiovascular: Negative.   Gastrointestinal: Negative.   Genitourinary: Negative.   Musculoskeletal:  Positive for joint pain.  Skin: Negative.   Neurological: Negative.    Psychiatric/Behavioral:  Positive for depression and substance abuse. Negative for hallucinations and suicidal ideas. The patient is nervous/anxious. The patient does not have insomnia.    Blood pressure 117/79, pulse 80, temperature 98 F (36.7 C), temperature source Oral, resp. rate 17, height '5\' 2"'$  (1.575 m), weight (!) 149.7 kg, SpO2 95 %. Body mass index is 60.36 kg/m.   Treatment Plan Summary:  Daily contact with patient to assess and evaluate symptoms and progress in treatment and Medication management  Plan:  Patient endorses ongoing knee pain.  Patient continues to endorse depression and anxiety but appears that her symptoms have improved.  Patient reports that she is unable to enroll into [facility] due to not having an ID.  Patient is requesting to be discharged to a friend by the name of Sarah Cervantes.  Provider informed patient's social worker to conduct safety planning with patient's friend prior to discharge.  Patient to  continue on the same psychiatric regimen.  Safety and Monitoring: Voluntary admission to inpatient psychiatric unit for safety, stabilization and treatment Daily contact with patient to assess and evaluate symptoms and progress in treatment Patient's case to be discussed in multi-disciplinary team meeting Observation Level : q15 minute checks Vital signs: q12 hours Precautions: suicide, elopement, and assault   Diagnosis: Principal Problem:   Bipolar disorder, curr episode depressed, severe, w/psychotic features ([place]) Active Problems:   PTSD (post-traumatic stress disorder)   Polysubstance abuse ([place])   Insomnia   Anxiety state  #Bipolar disorder, current episode depressed, severe, with psychotic features -Continue Abilify 10 mg daily for depressed mood and mood stability -Continue Prozac 40 mg daily for depressed mood   Agitation protocol             -Olanzapine Zydis 5 mg every 8 hours as needed for agitation AND             -Lorazepam  1 mg as needed for anxiety and severe agitation AND             -Ziprasidone 20 mg injection as needed for agitation   #Insomnia Patient reports that she had a sleep study formed prior to admission that confirmed a diagnosis of sleep apnea. -Continue trazodone 100 mg at bedtime to as needed -Continue prazosin 1 mg at bedtime -Continue melatonin 5 mg daily for sleep   #Anxiety -Continue Hydroxyzine 50 mg 3 times daily as needed for anxiety -Continue gabapentin 800 mg 3 times daily for anxiety   As needed medications: -Patient to continue taking Tylenol 650 mg every 6 hours as needed for mild pain -Patient to continue taking Maalox/Mylanta 30 mL every 4 hours as needed for indigestion -Patient to continue taking Milk of Magnesia 30 mL as needed for mild constipation   Discharge Planning: Social work and case management to assist with discharge planning and identification of hospital follow-up needs prior to discharge Estimated LOS: 5-7 days Discharge Concerns: Need to establish a safety plan; Medication compliance and effectiveness Discharge Goals: Return home with outpatient referrals for mental health follow-up including medication management/psychotherapy    I certify that inpatient services furnished can reasonably be expected to improve the patient's condition.  Malachy Mood, PA 04/05/2022, 7:33 PM

## 2022-04-05 NOTE — Group Note (Signed)
LCSW Group Therapy Note  Group Date: 04/05/2022 Start Time: 1300 End Time: 1400   Type of Therapy and Topic:  Group Therapy - Healthy vs Unhealthy Coping Skills  Participation Level:  Did Not Attend   Description of Group The focus of this group was to determine what unhealthy coping techniques typically are used by group members and what healthy coping techniques would be helpful in coping with various problems. Patients were guided in becoming aware of the differences between healthy and unhealthy coping techniques. Patients were asked to identify 2-3 healthy coping skills they would like to learn to use more effectively.  Therapeutic Goals Patients learned that coping is what human beings do all day long to deal with various situations in their lives Patients defined and discussed healthy vs unhealthy coping techniques Patients identified their preferred coping techniques and identified whether these were healthy or unhealthy Patients determined 2-3 healthy coping skills they would like to become more familiar with and use more often. Patients provided support and ideas to each other   Summary of Patient Progress:   Patient did not attend.   Therapeutic Modalities Cognitive Behavioral Therapy Motivational Interviewing  Charlett Lango 04/05/2022  1:59 PM

## 2022-04-05 NOTE — BH IP Treatment Plan (Signed)
Interdisciplinary Treatment and Diagnostic Plan Update  04/05/2022 Time of Session: 0830 Sarah Cervantes MRN: [id number]  Principal Diagnosis: Bipolar disorder, curr episode depressed, severe, w/psychotic features (Sarah Cervantes)  Secondary Diagnoses: Principal Problem:   Bipolar disorder, curr episode depressed, severe, w/psychotic features ([name]) Active Problems:   PTSD (post-traumatic stress disorder)   Polysubstance abuse ([name])   Insomnia   Anxiety state   Current Medications:  Current Facility-Administered Medications  Medication Dose Route Frequency Provider Last Rate Last Admin   acetaminophen (TYLENOL) tablet 650 mg  650 mg Oral Q6H PRN Leevy-Johnson, Brooke A, NP   650 mg at 04/05/22 0313   albuterol (VENTOLIN HFA) 108 (90 Base) MCG/ACT inhaler 2 puff  2 puff Inhalation Q6H PRN Nwoko, Uchenna E, PA       alum & mag hydroxide-simeth (MAALOX/MYLANTA) 200-200-20 MG/5ML suspension 30 mL  30 mL Oral Q4H PRN Leevy-Johnson, Brooke A, NP       amLODipine (NORVASC) tablet 5 mg  5 mg Oral Daily Nwoko, Uchenna E, PA   5 mg at 04/05/22 0727   ARIPiprazole (ABILIFY) tablet 10 mg  10 mg Oral Daily Nwoko, Uchenna E, PA   10 mg at 04/05/22 0727   docusate sodium (COLACE) capsule 200 mg  200 mg Oral Daily Nwoko, Uchenna E, PA   200 mg at 04/05/22 0728   FLUoxetine (PROZAC) capsule 40 mg  40 mg Oral Daily Nwoko, Uchenna E, PA   40 mg at 04/05/22 0727   furosemide (LASIX) tablet 40 mg  40 mg Oral Daily Nwoko, Uchenna E, PA   40 mg at 04/05/22 0726   gabapentin (NEURONTIN) capsule 800 mg  800 mg Oral TID Leevy-Johnson, Brooke A, NP   800 mg at 04/05/22 1252   hydrOXYzine (ATARAX) tablet 50 mg  50 mg Oral TID PRN Nwoko, Uchenna E, PA   50 mg at 04/02/22 1948   ibuprofen (ADVIL) tablet 400 mg  400 mg Oral Q6H PRN Nwoko, Uchenna E, PA   400 mg at 04/04/22 1725   lisinopril (ZESTRIL) tablet 10 mg  10 mg Oral q AM Nwoko, Uchenna E, PA   10 mg at 04/05/22 0606   OLANZapine zydis (ZYPREXA) disintegrating tablet  5 mg  5 mg Oral Q8H PRN Leevy-Johnson, Brooke A, NP   5 mg at 04/01/22 0909   And   LORazepam (ATIVAN) tablet 1 mg  1 mg Oral PRN Leevy-Johnson, Brooke A, NP       And   ziprasidone (GEODON) injection 20 mg  20 mg Intramuscular PRN Leevy-Johnson, Brooke A, NP       magnesium hydroxide (MILK OF MAGNESIA) suspension 30 mL  30 mL Oral Daily PRN Leevy-Johnson, Brooke A, NP       melatonin tablet 5 mg  5 mg Oral QHS Nwoko, Uchenna E, PA   5 mg at 04/04/22 2112   mometasone-formoterol (DULERA) 100-5 MCG/ACT inhaler 2 puff  2 puff Inhalation BID Massengill, Ovid Curd, MD   2 puff at 04/05/22 0726   pantoprazole (PROTONIX) EC tablet 40 mg  40 mg Oral Daily Nwoko, Uchenna E, PA   40 mg at 04/05/22 0727   prazosin (MINIPRESS) capsule 1 mg  1 mg Oral QHS Leevy-Johnson, Brooke A, NP   1 mg at 04/04/22 2112   Semaglutide(0.25 or 0.'5MG'$ /DOS) SOPN 0.5 mg  0.5 mg Subcutaneous Q Mon Nwoko, Uchenna E, PA       traZODone (DESYREL) tablet 100 mg  100 mg Oral QHS Nwoko, Uchenna E, PA   100  mg at 04/04/22 2115   traZODone (DESYREL) tablet 50 mg  50 mg Oral QHS PRN Leevy-Johnson, Brooke A, NP   50 mg at 03/31/22 2129   PTA Medications: Medications Prior to Admission  Medication Sig Dispense Refill Last Dose   acetaminophen (TYLENOL) 325 MG tablet Take 2 tablets (650 mg total) by mouth every 6 (six) hours as needed for mild pain (or Fever >/= 101). 20 tablet 0    albuterol (VENTOLIN HFA) 108 (90 Base) MCG/ACT inhaler Inhale 2 puffs into the lungs every 6 (six) hours as needed for wheezing or shortness of breath.      amLODipine (NORVASC) 5 MG tablet Take 5 mg by mouth daily.      ARIPiprazole (ABILIFY) 5 MG tablet Take 1 tablet (5 mg total) by mouth daily. 30 tablet 0    budesonide-formoterol (SYMBICORT) 80-4.5 MCG/ACT inhaler 2 puffs daily.      butalbital-acetaminophen-caffeine (FIORICET) 50-325-40 MG tablet Take 1 tablet by mouth every 4 (four) hours as needed for headache.      docusate sodium (COLACE) 100 MG capsule  Take 1 capsule (100 mg total) by mouth 2 (two) times daily. (Patient taking differently: Take 200 mg by mouth daily.) 10 capsule 0    FLUoxetine (PROZAC) 40 MG capsule Take 40 mg by mouth in the morning.      furosemide (LASIX) 40 MG tablet Take 1 tablet (40 mg total) by mouth daily. 30 tablet 0    gabapentin (NEURONTIN) 400 MG capsule Take 1 capsule (400 mg total) by mouth 3 (three) times daily. (Patient taking differently: Take 800 mg by mouth 3 (three) times daily.) 90 capsule 0    hydrocortisone (ANUSOL-HC) 2.5 % rectal cream Place rectally 3 (three) times daily. (Patient taking differently: Place 1 Application rectally daily as needed for hemorrhoids.) 30 g 0    hydrOXYzine (ATARAX/VISTARIL) 50 MG tablet Take 1 tablet (50 mg total) by mouth 3 (three) times daily as needed for anxiety. (Patient taking differently: Take 50 mg by mouth daily as needed for anxiety.) 30 tablet 0    ibuprofen (ADVIL) 200 MG tablet Take 400 mg by mouth every 8 (eight) hours as needed for headache or mild pain.      lidocaine (LIDODERM) 5 % Place 1 patch onto the skin daily. Remove & Discard patch within 12 hours or as directed by MD (Patient taking differently: Place 2 patches onto the skin daily. Remove & Discard patch within 12 hours or as directed by MD) 30 patch 0    lisinopril (ZESTRIL) 10 MG tablet Take 10 mg by mouth in the morning.      methocarbamol (ROBAXIN) 500 MG tablet Take 1 tablet (500 mg total) by mouth every 8 (eight) hours as needed for muscle spasms. 20 tablet 0    nicotine polacrilex (NICORETTE) 2 MG gum Take 1 each (2 mg total) by mouth as needed for smoking cessation. (Patient not taking: Reported on 03/14/2022) 100 tablet 0    ondansetron (ZOFRAN) 4 MG tablet Take 1 tablet (4 mg total) by mouth every 6 (six) hours as needed for nausea. (Patient taking differently: Take 4 mg by mouth every 6 (six) hours as needed for nausea. odt) 20 tablet 0    OZEMPIC, 0.25 OR 0.5 MG/DOSE, 2 MG/1.5ML SOPN Inject 0.5  mg into the skin every Monday.      prazosin (MINIPRESS) 1 MG capsule Take 1 capsule (1 mg total) by mouth at bedtime. 30 capsule 0    senna (SENOKOT)  8.6 MG TABS tablet Take 1 tablet (8.6 mg total) by mouth daily. 30 tablet 0    topiramate (TOPAMAX) 50 MG tablet Take 1 tablet (50 mg total) by mouth at bedtime. (Patient not taking: Reported on 03/14/2022) 30 tablet 0    traZODone (DESYREL) 100 MG tablet Take 1 tablet (100 mg total) by mouth at bedtime and may repeat dose one time if needed. (Patient taking differently: Take 100 mg by mouth at bedtime.) 30 tablet 0     Patient Stressors: Financial difficulties   Health problems   Substance abuse    Patient Strengths: Hydrographic surveyor for treatment/growth   Treatment Modalities: Medication Management, Group therapy, Case management,  1 to 1 session with clinician, Psychoeducation, Recreational therapy.   Physician Treatment Plan for Primary Diagnosis: Bipolar disorder, curr episode depressed, severe, w/psychotic features ([place]) Long Term Goal(s): Improvement in symptoms so as ready for discharge   Short Term Goals: Ability to identify changes in lifestyle to reduce recurrence of condition will improve Ability to verbalize feelings will improve Ability to disclose and discuss suicidal ideas Ability to demonstrate self-control will improve Ability to identify and develop effective coping behaviors will improve Ability to maintain clinical measurements within normal limits will improve Compliance with prescribed medications will improve Ability to identify triggers associated with substance abuse/mental health issues will improve  Medication Management: Evaluate patient's response, side effects, and tolerance of medication regimen.  Therapeutic Interventions: 1 to 1 sessions, Unit Group sessions and Medication administration.  Evaluation of Outcomes: Progressing  Physician Treatment Plan for Secondary Diagnosis:  Principal Problem:   Bipolar disorder, curr episode depressed, severe, w/psychotic features ([place]) Active Problems:   PTSD (post-traumatic stress disorder)   Polysubstance abuse ([place])   Insomnia   Anxiety state  Long Term Goal(s): Improvement in symptoms so as ready for discharge   Short Term Goals: Ability to identify changes in lifestyle to reduce recurrence of condition will improve Ability to verbalize feelings will improve Ability to disclose and discuss suicidal ideas Ability to demonstrate self-control will improve Ability to identify and develop effective coping behaviors will improve Ability to maintain clinical measurements within normal limits will improve Compliance with prescribed medications will improve Ability to identify triggers associated with substance abuse/mental health issues will improve     Medication Management: Evaluate patient's response, side effects, and tolerance of medication regimen.  Therapeutic Interventions: 1 to 1 sessions, Unit Group sessions and Medication administration.  Evaluation of Outcomes: Progressing   RN Treatment Plan for Primary Diagnosis: Bipolar disorder, curr episode depressed, severe, w/psychotic features ([place]) Long Term Goal(s): Knowledge of disease and therapeutic regimen to maintain health will improve  Short Term Goals: Ability to remain free from injury will improve, Ability to verbalize frustration and anger appropriately will improve, Ability to demonstrate self-control, Ability to participate in decision making will improve, Ability to verbalize feelings will improve, Ability to disclose and discuss suicidal ideas, Ability to identify and develop effective coping behaviors will improve, and Compliance with prescribed medications will improve  Medication Management: RN will administer medications as ordered by provider, will assess and evaluate patient's response and provide education to patient for prescribed medication. RN  will report any adverse and/or side effects to prescribing provider.  Therapeutic Interventions: 1 on 1 counseling sessions, Psychoeducation, Medication administration, Evaluate responses to treatment, Monitor vital signs and CBGs as ordered, Perform/monitor CIWA, COWS, AIMS and Fall Risk screenings as ordered, Perform wound care treatments as ordered.  Evaluation of Outcomes: Progressing  LCSW Treatment Plan for Primary Diagnosis: Bipolar disorder, curr episode depressed, severe, w/psychotic features ([place]) Long Term Goal(s): Safe transition to appropriate next level of care at discharge, Engage patient in therapeutic group addressing interpersonal concerns.  Short Term Goals: Engage patient in aftercare planning with referrals and resources, Increase social support, Increase ability to appropriately verbalize feelings, Increase emotional regulation, Facilitate acceptance of mental health diagnosis and concerns, Facilitate patient progression through stages of change regarding substance use diagnoses and concerns, Identify triggers associated with mental health/substance abuse issues, and Increase skills for wellness and recovery  Therapeutic Interventions: Assess for all discharge needs, 1 to 1 time with Social worker, Explore available resources and support systems, Assess for adequacy in community support network, Educate family and significant other(s) on suicide prevention, Complete Psychosocial Assessment, Interpersonal group therapy.  Evaluation of Outcomes: Progressing   Progress in Treatment: Attending groups: Yes. Participating in groups: Yes. Taking medication as prescribed: Yes. Toleration medication: Yes. Family/Significant other contact made: Yes, individual(s) contacted:  Percival Spanish [facility] [phone] Patient understands diagnosis: Yes. Discussing patient identified problems/goals with staff: Yes. Medical problems stabilized or resolved: Yes. Denies suicidal/homicidal  ideation: Yes. Issues/concerns per patient self-inventory: Yes. Other: none  New problem(s) identified: No, Describe:  none  New Short Term/Long Term Goal(s): Patient to work towards detox, medication management for mood stabilization; elimination of SI thoughts; development of comprehensive mental wellness/sobriety plan.  Patient Goals:  Patient states their goal for treatment is to "live life without killing myself . . . Learn coping skills and be happy."  Discharge Plan or Barriers: No psychosocial barriers identified at this time, patient to return to place of residence when appropriate for discharge.   Reason for Continuation of Hospitalization: Depression Medication stabilization  Estimated Length of Stay: 1-7 days  Last 3 [place] Suicide Severity Risk Score: Flowsheet Row ED to Hosp-Admission (Current) from 03/31/2022 in [facility] ED from 03/30/2022 in [facility] ED to Hosp-Admission (Discharged) from 03/14/2022 in [facility] High Risk High Risk No Risk      Scribe for Treatment Team: Larose Kells 04/05/2022 3:56 PM

## 2022-04-05 NOTE — Progress Notes (Signed)
The patient attended the evening N.A.group and was appropriate.

## 2022-04-05 NOTE — Progress Notes (Signed)
D:  Patient's self inventory form, patient has fair sleep, sleep medication not helping.  Good appetite, low energy level, poor concentration.  Rated depression and hopeless 5, anxiety 3.  Withdrawals, diarrhea, chilling, cravings, cramping, agitation, runny nose.  SI, contracts for safety, no plan.  Physical problems, pain, knee, worst pain #8 in past 24 hours.  Goal is work on bad thoughts.  Plans to learn about myself and thoughts to help.  Does have discharge plans. A:  Medications administered per MD orders.  Emotional support and encouragement given patient. R:  Denied HI.  SI, contracts for safety.  Patient does see shadows.  Denied auditory hallucination.  Safety maintained with 15 minute checks.  Patient will talk to MD about discharge plans.

## 2022-04-06 MED ORDER — SENNA 8.6 MG PO TABS: 1.0000 | ORAL_TABLET | Freq: Every day | ORAL | 0 refills | Status: DC

## 2022-04-06 MED ORDER — NICOTINE POLACRILEX 2 MG MT GUM: 2.0000 mg | CHEWING_GUM | OROMUCOSAL | 0 refills | Status: DC | PRN

## 2022-04-06 MED ORDER — FLUOXETINE HCL 40 MG PO CAPS: 40.0000 mg | ORAL_CAPSULE | Freq: Every day | ORAL | 0 refills | Status: DC

## 2022-04-06 MED ORDER — HYDROXYZINE HCL 50 MG PO TABS: 50.0000 mg | ORAL_TABLET | Freq: Three times a day (TID) | ORAL | 0 refills | Status: DC | PRN

## 2022-04-06 MED ORDER — MELATONIN 5 MG PO TABS: 5.0000 mg | ORAL_TABLET | Freq: Every day | ORAL | 0 refills | Status: AC

## 2022-04-06 MED ORDER — TRAZODONE HCL 100 MG PO TABS: 100.0000 mg | ORAL_TABLET | Freq: Every day | ORAL | 0 refills | Status: DC

## 2022-04-06 MED ORDER — DOCUSATE SODIUM 100 MG PO CAPS: 200.0000 mg | ORAL_CAPSULE | Freq: Every day | ORAL | 0 refills | Status: AC

## 2022-04-06 MED ORDER — PRAZOSIN HCL 1 MG PO CAPS: 1.0000 mg | ORAL_CAPSULE | Freq: Every day | ORAL | 0 refills | Status: DC

## 2022-04-06 MED ORDER — PANTOPRAZOLE SODIUM 40 MG PO TBEC: 40.0000 mg | DELAYED_RELEASE_TABLET | Freq: Every day | ORAL | 0 refills | Status: DC

## 2022-04-06 MED ORDER — ARIPIPRAZOLE 10 MG PO TABS: 10.0000 mg | ORAL_TABLET | Freq: Every day | ORAL | 0 refills | Status: DC

## 2022-04-06 MED ORDER — FUROSEMIDE 40 MG PO TABS: 40.0000 mg | ORAL_TABLET | Freq: Every day | ORAL | 0 refills | Status: DC

## 2022-04-06 MED ORDER — LISINOPRIL 10 MG PO TABS: 10.0000 mg | ORAL_TABLET | Freq: Every morning | ORAL | 0 refills | Status: DC

## 2022-04-06 MED ORDER — AMLODIPINE BESYLATE 5 MG PO TABS: 5.0000 mg | ORAL_TABLET | Freq: Every day | ORAL | 0 refills | Status: DC

## 2022-04-06 MED ORDER — DOCUSATE SODIUM 100 MG PO CAPS: 200.0000 mg | ORAL_CAPSULE | Freq: Every day | ORAL | 0 refills | Status: DC

## 2022-04-06 MED ORDER — GABAPENTIN 400 MG PO CAPS: 800.0000 mg | ORAL_CAPSULE | Freq: Three times a day (TID) | ORAL | 0 refills | Status: DC

## 2022-04-06 MED ORDER — MELATONIN 5 MG PO TABS: 5.0000 mg | ORAL_TABLET | Freq: Every day | ORAL | 0 refills | Status: DC

## 2022-04-06 NOTE — BHH Suicide Risk Assessment (Signed)
Suicide Risk Assessment  Discharge Assessment    [facility] Discharge Suicide Risk Assessment   Principal Problem: Bipolar disorder, curr episode depressed, severe, w/psychotic features [facility]) Discharge Diagnoses: Principal Problem:   Bipolar disorder, curr episode depressed, severe, w/psychotic features ([place]) Active Problems:   PTSD (post-traumatic stress disorder)   Polysubstance abuse ([place])   Insomnia   Anxiety state   HPI:  Sarah Cervantes is a 41 year old, Caucasian female with a past psychiatric history significant for PTSD, bipolar disorder, polysubstance abuse, and generalized anxiety disorder who was involuntarily admitted to [facility] due to suicidal ideations with a plan.    HOSPITAL COURSE:  During the patient's hospitalization, patient had extensive initial psychiatric evaluation, and follow-up psychiatric evaluations every day.  Psychiatric diagnoses provided upon initial assessment: Bipolar disorder, current episode depressed, severe, without psychotic features, PTSD, and polysubstance abuse  Patient's psychiatric medications were adjusted on admission:  -Started Abilify 5 mg daily -Started Trazodone 100 mg at bedtime -Started prazosin 1 mg at bedtime -Started hydroxyzine 50 mg 3 times daily  Agitation protocol             -Olanzapine Zydis 5 mg every 8 hours as needed for agitation AND             -Lorazepam 1 mg as needed for anxiety and severe agitation AND             -Ziprasidone 20 mg injection as needed for agitation  During the hospitalization, other adjustments were made to the patient's psychiatric medication regimen:  -Started Prozac 20 mg daily, increased to 40 mg daily -Started and continued Melatonin 5 mg daily -Started and continued gabapentin 800 mg 3 times daily -Increased Abilify to 10 mg daily -Continued prazosin 1 mg at bedtime -Continued trazodone 100 mg at bedtime as needed -Continued hydroxyzine 50 mg 3 times as  needed  Agitation protocol             -Olanzapine Zydis 5 mg every 8 hours as needed for agitation AND             -Lorazepam 1 mg as needed for anxiety and severe agitation AND             -Ziprasidone 20 mg injection as needed for agitation   Patient's care was discussed during the interdisciplinary team meeting every day during the hospitalization.  The patient denied having side effects to prescribed psychiatric medication.  Gradually, patient started adjusting to milieu. The patient was evaluated each day by a clinical provider to ascertain response to treatment. Improvement was noted by the patient's report of decreasing symptoms, improved sleep and appetite, affect, medication tolerance, behavior, and participation in unit programming.  Patient was asked each day to complete a self inventory noting mood, mental status, pain, new symptoms, anxiety and concerns.    Symptoms were reported as significantly decreased or resolved completely by discharge.   On day of discharge, the patient reports that their mood is stable. The patient denied having suicidal thoughts for more than 48 hours prior to discharge.  Patient denies having homicidal thoughts.  Patient denies having auditory hallucinations.  Patient denies any visual hallucinations or other symptoms of psychosis. The patient was motivated to continue taking medication with a goal of continued improvement in mental health.   The patient reports their target psychiatric symptoms of bipolar disorder, current episode depressed, severe, without psychotic features, PTSD, and polysubstance abuse responded well to the psychiatric medications, and the patient reports overall benefit  other psychiatric hospitalization. Supportive psychotherapy was provided to the patient. The patient also participated in regular group therapy while hospitalized. Coping skills, problem solving as well as relaxation therapies were also part of the unit  programming.  Labs were reviewed with the patient, and abnormal results were discussed with the patient.  The patient is able to verbalize their individual safety plan to this provider.  # It is recommended to the patient to continue psychiatric medications as prescribed, after discharge from the hospital.    # It is recommended to the patient to follow up with your outpatient psychiatric provider and PCP.  # It was discussed with the patient, the impact of alcohol, drugs, tobacco have been there overall psychiatric and medical wellbeing, and total abstinence from substance use was recommended the patient.ed.  # Prescriptions provided or sent directly to preferred pharmacy at discharge. Patient agreeable to plan. Given opportunity to ask questions. Appears to feel comfortable with discharge.    # In the event of worsening symptoms, the patient is instructed to call the crisis hotline, 911 and or go to the nearest ED for appropriate evaluation and treatment of symptoms. To follow-up with primary care provider for other medical issues, concerns and or health care needs  # Patient was discharged with friend with a plan to follow up as noted below.    Total Time spent with patient: 15 minutes  Musculoskeletal: Strength & Muscle Tone: within normal limits Gait & Station: normal Patient leans: N/A  Psychiatric Specialty Exam  Presentation  General Appearance:  Appropriate for Environment  Eye Contact: Fair  Speech: Clear and Coherent; Normal Rate  Speech Volume: Normal  Handedness: Right   Mood and Affect  Mood: Euthymic  Duration of Depression Symptoms: Less than two weeks  Affect: Appropriate   Thought Process  Thought Processes: Coherent; Goal Directed  Descriptions of Associations:Intact  Orientation:Full (Time, Place and Person)  Thought Content:Logical  History of Schizophrenia/Schizoaffective disorder:No  Duration of Psychotic  Symptoms:N/A  Hallucinations:Hallucinations: None  Ideas of Reference:None  Suicidal Thoughts:Suicidal Thoughts: No  Homicidal Thoughts:Homicidal Thoughts: No   Sensorium  Memory: Immediate Good; Recent Good; Remote Good  Judgment: Fair  Insight: Fair   Community education officer  Concentration: Good  Attention Span: Good  Recall: Good  Fund of Knowledge: Fair  Language: Good   Psychomotor Activity  Psychomotor Activity: Psychomotor Activity: Normal   Assets  Assets: Communication Skills; Desire for Improvement   Sleep  Sleep: Sleep: Good   Physical Exam: Physical Exam Constitutional:      Appearance: Normal appearance.  HENT:     Head: Normocephalic and atraumatic.     Nose: Nose normal.     Mouth/Throat:     Mouth: Mucous membranes are moist.  Eyes:     Extraocular Movements: Extraocular movements intact.     Pupils: Pupils are equal, round, and reactive to light.  Cardiovascular:     Rate and Rhythm: Normal rate and regular rhythm.  Pulmonary:     Effort: Pulmonary effort is normal.     Breath sounds: Normal breath sounds.  Abdominal:     General: Abdomen is flat.  Musculoskeletal:        General: Normal range of motion.     Cervical back: Normal range of motion and neck supple.  Skin:    General: Skin is warm and dry.  Neurological:     General: No focal deficit present.     Mental Status: She is alert and oriented to person,  place, and time.  Psychiatric:        Attention and Perception: Attention and perception normal. She does not perceive auditory or visual hallucinations.        Mood and Affect: Mood and affect normal. Mood is not anxious or depressed.        Speech: Speech normal.        Behavior: Behavior normal. Behavior is cooperative.        Thought Content: Thought content normal. Thought content is not paranoid or delusional. Thought content does not include homicidal or suicidal ideation.        Cognition and Memory:  Cognition and memory normal.        Judgment: Judgment normal.    Review of Systems  Constitutional: Negative.   HENT: Negative.    Eyes: Negative.   Respiratory: Negative.    Cardiovascular: Negative.   Gastrointestinal: Negative.   Musculoskeletal:  Positive for joint pain.  Skin: Negative.   Neurological: Negative.   Psychiatric/Behavioral:  Positive for substance abuse. Negative for depression, hallucinations and suicidal ideas. The patient is not nervous/anxious and does not have insomnia.    Blood pressure 124/85, pulse 71, temperature 97.8 F (36.6 C), temperature source Oral, resp. rate 18, height '5\' 2"'$  (1.575 m), weight (!) 149.7 kg, SpO2 95 %. Body mass index is 60.36 kg/m.  Mental Status Per Nursing Assessment::   On Admission:  Self-harm thoughts  Nursing information obtained from:    Demographic factors:  Low socioeconomic status, Unemployed Current Mental Status:  Self-harm thoughts Loss Factors:  Financial problems / change in socioeconomic status, Decrease in vocational status Historical Factors:  Impulsivity Risk Reduction Factors:  NA  Suicide Risk:  Mild: There are no identifiable plans, no associated intent, mild dysphoria and related symptoms, good self-control (both objective and subjective assessment), few other risk factors, and identifiable protective factors, including available and accessible social support.  Plan Of Care/Follow-up recommendations:  Activity: As tolerated  Diet: Heart healthy  Other: -Follow-up with your outpatient psychiatric provider -instructions on appointment date, time, and address (location) are provided to you in discharge paperwork.   -Take your psychiatric medications as prescribed at discharge - instructions are provided to you in the discharge paperwork.    -Follow-up with outpatient primary care doctor and other specialists -for management of preventative medicine and chronic medical disease, including:   -Testing:  Follow-up with outpatient provider for abnormal lab results:  none   -Recommend abstinence from alcohol, tobacco, and other illicit drug use at discharge.    -If your psychiatric symptoms recur, worsen, or if you have side effects to your psychiatric medications, call your outpatient psychiatric provider, 911, [phone] or go to the nearest emergency department.   -If suicidal thoughts recur, call your outpatient psychiatric provider, 911, [phone] or go to the nearest emergency department.   Malachy Mood, PA 04/06/2022, 10:37 AM

## 2022-04-06 NOTE — Progress Notes (Signed)
   04/06/22 1000  Psych Admission Type (Psych Patients Only)  Admission Status Involuntary  Psychosocial Assessment  Patient Complaints Worrying  Eye Contact Brief  Facial Expression Anxious  Affect Anxious  Speech Logical/coherent  Interaction Assertive  Motor Activity Slow  Appearance/Hygiene Disheveled  Behavior Characteristics Cooperative;Anxious  Mood Anxious;Pleasant;Sad  Thought Process  Coherency WDL  Content WDL  Delusions None reported or observed  Perception WDL  Hallucination None reported or observed  Judgment Poor  Confusion None  Danger to Self  Current suicidal ideation? Denies  Self-Injurious Behavior No self-injurious ideation or behavior indicators observed or expressed   Agreement Not to Harm Self Yes  Description of Agreement verbal contract  Danger to Others  Danger to Others None reported or observed

## 2022-04-06 NOTE — [facility] Suicide Risk Assessment (Signed)
[facility] INPATIENT:  Family/Significant Other Suicide Prevention Education  Suicide Prevention Education:  Education Completed; on 04-06-2022,  Lula Olszewski [phone] has been identified by the patient as the friend with whom the patient will be residing, and identified as the person(s) who will aid the patient in the event of a mental health crisis (suicidal ideations/suicide attempt).  With written consent from the patient, the family member/significant other has been provided the following suicide prevention education, prior to the and/or following the discharge of the patient.  The suicide prevention education provided includes the following: Suicide risk factors Suicide prevention and interventions National Suicide Hotline telephone number [facility] assessment telephone number [facility] [place] and/or Residential Mobile Crisis Unit telephone number  Request made of family/significant other to: Remove weapons (e.g., guns, rifles, knives), all items previously/currently identified as safety concern.   Remove drugs/medications (over-the-counter, prescriptions, illicit drugs), all items previously/currently identified as a safety concern.   Lula Olszewski [phone]  verbalizes understanding of the suicide prevention education information provided.  The family member/significant other agrees to remove the items of safety concern listed above.  Mario Voong S Zorion Nims 04/06/2022, 1:02 PM

## 2022-04-06 NOTE — Progress Notes (Signed)
  [facility] Adult Case Management Discharge Plan :  Will you be returning to the same living situation after discharge:  Yes,   Sarah Cervantes [phone]  At discharge, do you have transportation home?: Yes,   Sarah Cervantes [phone]  Do you have the ability to pay for your medications: Yes,  insured  Release of information consent forms completed and in the chart;  Patient's signature needed at discharge.  Patient to Follow up at:   Next level of care provider has access to [place] and Suicide Prevention discussed: Yes,   Sarah Cervantes [phone]      Has patient been referred to the Quitline?: Patient refused referral  Patient has been referred for addiction treatment: Yes Patient to continue working towards treatment goals after discharge. Patient no longer meets criteria for inpatient criteria per attending physician. Continue taking medications as prescribed, nursing to provide instructions at discharge. Follow up with all scheduled appointments.   Ahria Slappey S Kenyan Karnes, LCSW 04/06/2022, 1:04 PM

## 2022-04-06 NOTE — Progress Notes (Signed)
Pt discharged to lobby. Pt was stable and appreciative at that time. All papers, samples, and prescriptions were given and valuables returned. Verbal understanding expressed. Denies SI/HI and A/VH. Pt given opportunity to express concerns and ask questions.

## 2022-04-06 NOTE — Progress Notes (Signed)
Calm, cooperative, med complaint.   04/05/22 2100  Psych Admission Type (Psych Patients Only)  Admission Status Involuntary  Psychosocial Assessment  Patient Complaints Anxiety  Eye Contact Brief  Facial Expression Anxious  Affect Anxious  Speech Logical/coherent  Interaction Assertive  Motor Activity Slow  Appearance/Hygiene Disheveled  Behavior Characteristics Anxious  Mood Depressed;Anxious  Thought Process  Coherency WDL  Content WDL  Delusions None reported or observed  Perception WDL  Hallucination None reported or observed  Judgment Poor  Confusion None  Danger to Self  Current suicidal ideation? Denies  Self-Injurious Behavior No self-injurious ideation or behavior indicators observed or expressed   Agreement Not to Harm Self Yes  Description of Agreement Verbal  Danger to Others  Danger to Others None reported or observed

## 2022-04-06 NOTE — Discharge Summary (Signed)
Physician Discharge Summary Note  Patient:  Sarah Cervantes is an 41 y.o., female MRN:  [id number] DOB:  01/27/1981 Patient phone:  [phone] (home)  Patient address:   [place] [address],  Total Time spent with patient: 15 minutes  Date of Admission:  03/31/2022 Date of Discharge: 04/06/2022  Reason for Admission:    Sarah Cervantes is a 41 year old, Caucasian female with a past psychiatric history significant for PTSD, bipolar disorder, and generalized anxiety disorder who was involuntarily admitted to [facility] due to suicidal ideations with a plan.   Patient reports that she was admitted to [facility]  due to "Just want to die, I have nothing to live for anymore."  Patient reports that she has been feeling this way for 5 days.  Patient reports she had a plan to overdose on a number of pills that she is currently taking including lisinopril, Tylenol, ibuprofen, Abilify, Prozac, and gabapentin.  Patient reports that she attempted to overdose 2 weeks ago but it did not work.  She reports that she was going to try to overdose yesterday but stopped since she did not die the last time she overdosed.  Patient reports that she ended up calling 911 and was delivered to [facility] counselor assessment.  Patient states that her suicide attempt was triggered to life stressors.  Patient endorses life in general as a stressor and states that her family does not like her.  She reports that she is not allowed to be around her daughter.  Patient's current stressors include unemployment, homelessness, and financial instability.   In addition to suicidal ideations, patient states that she has been dealing with depression for 35 years.  Patient states that her depression has been worsening over the past 10 years.  Patient denies any alleviating factors to her depression and states that she last took her medications correctly a month ago.  Patient endorses the following  depressive symptoms: insomnia, hypersomnia, anhedonia, depressed mood, feelings of guilt/worthlessness, hopelessness, decreased energy, decreased concentration, self-isolation, increased appetite, weight gain, agitation, psychomotor retardation, and memory issues.  Patient also endorses anxiety she rates a 10 out of 10.  Patient endorses panic attacks and states that her last panic attack occurred the day before yesterday after trying to call her family.   Patient endorses a past history of manic episodes and states that she has been without sleep for the past 7 days due to being on crack cocaine.  Patient also states that she has been without sleep for 10 days due to her manic episodes.  Patient endorses the following symptoms: grandiosity, racing thoughts, financial extravagance, irritability, mood swings, and pressured speech.  Patient endorses auditory hallucinations characterized by hearing all kinds of things.  Patient endorses visual hallucinations characterized by seeing shadows.  Patient denies command type hallucinations.  Patient endorses paranoia characterized by feeling like people are out to get her.   Patient reports that she feels ill.  Patient continues to endorse depression characterized by hopelessness and worthlessness.  Patient endorses anxiety.  Patient endorses suicidal ideations with no specific plan in place.  Patient denies homicidal ideations.  Patient endorses auditory hallucinations stating that it is "too much right now."  Patient endorses paranoia and delusional thoughts.  Patient endorses poor sleep and received roughly 4 hours of sleep the previous night.  Patient endorses good appetite.   Patient is casually dressed, somnolent, and lying in bed during the assessment.  Patient drifts in and out asleep during  the assessment resulting in many of the questions needing to be repeated during the history portion of the assessment.  Patient is irritable, mostly cooperative, and  engaged in conversation during the encounter.  Patient's eye contact is fleeting during the assessment.  Patient's speech is clear, coherent, and with normal rate.  Patient's thought process is fluid and organized.  Patient's thought content is logical.  Patient's mood is depressed and anxious with congruent affect.  Patient dorsums auditory hallucinations but does not appear to be responding to internal/external stimuli.  Principal Problem: Bipolar disorder, curr episode depressed, severe, w/psychotic features [facility]) Discharge Diagnoses: Principal Problem:   Bipolar disorder, curr episode depressed, severe, w/psychotic features ([place]) Active Problems:   PTSD (post-traumatic stress disorder)   Polysubstance abuse ([place])   Insomnia   Anxiety state   Past Psychiatric History:  Bipolar disorder PTSD Polysubstance abuse  Past Medical History:  Past Medical History:  Diagnosis Date   Cocaine abuse, daily use ([place]) 03/23/2018   ETOH abuse 03/23/2018   Heroin abuse ([place]) 03/23/2018   History reviewed. No pertinent surgical history. Family History: History reviewed. No pertinent family history. Family Psychiatric  History:  Patient reports that most of her family members have some type of psychiatric illness but was unsure of what they specifically had      Social History:  Social History   Substance and Sexual Activity  Alcohol Use Yes   Alcohol/week: 12.0 - 15.0 standard drinks of alcohol   Types: 12 - 15 Cans of beer per week   Comment: drinking daily for the past week     Social History   Substance and Sexual Activity  Drug Use Yes   Types: Cocaine    Social History   Socioeconomic History   Marital status: Single    Spouse name: Not on file   Number of children: Not on file   Years of education: Not on file   Highest education level: Not on file  Occupational History   Not on file  Tobacco Use   Smoking status: Former    Packs/day: 0.50    Years: 30.00    Total pack  years: 15.00    Types: Cigarettes    Quit date: 01/12/2022    Years since quitting: 0.2   Smokeless tobacco: Never  Vaping Use   Vaping Use: Never used  Substance and Sexual Activity   Alcohol use: Yes    Alcohol/week: 12.0 - 15.0 standard drinks of alcohol    Types: 12 - 15 Cans of beer per week    Comment: drinking daily for the past week   Drug use: Yes    Types: Cocaine   Sexual activity: Yes    Birth control/protection: None  Other Topics Concern   Not on file  Social History Narrative   Not on file   Social Determinants of Health   Financial Resource Strain: Not on file  Food Insecurity: Unknown (03/31/2022)   Hunger Vital Sign    Worried About Running Out of Food in the Last Year: Patient refused    Ran Out of Food in the Last Year: Patient refused  Recent Concern: [place] Present (03/15/2022)   Hunger Vital Sign    Worried About Running Out of Food in the Last Year: Often true    Ran Out of Food in the Last Year: Often true  Transportation Needs: Unknown (03/31/2022)   PRAPARE - Hydrologist (Medical): Patient refused  Lack of Transportation (Non-Medical): Patient refused  Recent Concern: Transportation Needs - Unmet Transportation Needs (03/15/2022)   PRAPARE - Hydrologist (Medical): Yes    Lack of Transportation (Non-Medical): Yes  Physical Activity: Not on file  Stress: Not on file  Social Connections: Not on file    Hospital Course:  During the patient's hospitalization, patient had extensive initial psychiatric evaluation, and follow-up psychiatric evaluations every day.   Psychiatric diagnoses provided upon initial assessment: Bipolar disorder, current episode depressed, severe, without psychotic features, PTSD, and polysubstance abuse   Patient's psychiatric medications were adjusted on admission:  -Started Abilify 5 mg daily -Started Trazodone 100 mg at  bedtime -Started prazosin 1 mg at bedtime -Started hydroxyzine 50 mg 3 times daily   Agitation protocol             -Olanzapine Zydis 5 mg every 8 hours as needed for agitation AND             -Lorazepam 1 mg as needed for anxiety and severe agitation AND             -Ziprasidone 20 mg injection as needed for agitation   During the hospitalization, other adjustments were made to the patient's psychiatric medication regimen:  -Started Prozac 20 mg daily, increased to 40 mg daily -Started and continued Melatonin 5 mg daily -Started and continued gabapentin 800 mg 3 times daily -Increased Abilify to 10 mg daily -Continued prazosin 1 mg at bedtime -Continued trazodone 100 mg at bedtime as needed -Continued hydroxyzine 50 mg 3 times as needed   Agitation protocol             -Olanzapine Zydis 5 mg every 8 hours as needed for agitation AND             -Lorazepam 1 mg as needed for anxiety and severe agitation AND             -Ziprasidone 20 mg injection as needed for agitation     Patient's care was discussed during the interdisciplinary team meeting every day during the hospitalization.   The patient denied having side effects to prescribed psychiatric medication.   Gradually, patient started adjusting to milieu. The patient was evaluated each day by a clinical provider to ascertain response to treatment. Improvement was noted by the patient's report of decreasing symptoms, improved sleep and appetite, affect, medication tolerance, behavior, and participation in unit programming.  Patient was asked each day to complete a self inventory noting mood, mental status, pain, new symptoms, anxiety and concerns.     Symptoms were reported as significantly decreased or resolved completely by discharge.    On day of discharge, the patient reports that their mood is stable. The patient denied having suicidal thoughts for more than 48 hours prior to discharge.  Patient denies having homicidal  thoughts.  Patient denies having auditory hallucinations.  Patient denies any visual hallucinations or other symptoms of psychosis. The patient was motivated to continue taking medication with a goal of continued improvement in mental health.    The patient reports their target psychiatric symptoms of bipolar disorder, current episode depressed, severe, without psychotic features, PTSD, and polysubstance abuse responded well to the psychiatric medications, and the patient reports overall benefit other psychiatric hospitalization. Supportive psychotherapy was provided to the patient. The patient also participated in regular group therapy while hospitalized. Coping skills, problem solving as well as relaxation therapies were also part of the unit programming.  Labs were reviewed with the patient, and abnormal results were discussed with the patient.   The patient is able to verbalize their individual safety plan to this provider.   # It is recommended to the patient to continue psychiatric medications as prescribed, after discharge from the hospital.     # It is recommended to the patient to follow up with your outpatient psychiatric provider and PCP.   # It was discussed with the patient, the impact of alcohol, drugs, tobacco have been there overall psychiatric and medical wellbeing, and total abstinence from substance use was recommended the patient.ed.   # Prescriptions provided or sent directly to preferred pharmacy at discharge. Patient agreeable to plan. Given opportunity to ask questions. Appears to feel comfortable with discharge.    # In the event of worsening symptoms, the patient is instructed to call the crisis hotline, 911 and or go to the nearest ED for appropriate evaluation and treatment of symptoms. To follow-up with primary care provider for other medical issues, concerns and or health care needs   # Patient was discharged with friend with a plan to follow up as noted  below.  Physical Findings: AIMS:  , ,  ,  ,    CIWA:    COWS:     Musculoskeletal: Strength & Muscle Tone: within normal limits Gait & Station: normal Patient leans: N/A   Psychiatric Specialty Exam:  Presentation  General Appearance:  Appropriate for Environment  Eye Contact: Fair  Speech: Clear and Coherent; Normal Rate  Speech Volume: Normal  Handedness: Right   Mood and Affect  Mood: Euthymic  Affect: Appropriate   Thought Process  Thought Processes: Coherent; Goal Directed  Descriptions of Associations:Intact  Orientation:Full (Time, Place and Person)  Thought Content:Logical  History of Schizophrenia/Schizoaffective disorder:No  Duration of Psychotic Symptoms:N/A  Hallucinations:Hallucinations: None  Ideas of Reference:None  Suicidal Thoughts:Suicidal Thoughts: No  Homicidal Thoughts:Homicidal Thoughts: No   Sensorium  Memory: Immediate Good; Recent Good; Remote Good  Judgment: Fair  Insight: Fair   Community education officer  Concentration: Good  Attention Span: Good  Recall: Good  Fund of Knowledge: Fair  Language: Good   Psychomotor Activity  Psychomotor Activity: Psychomotor Activity: Normal   Assets  Assets: Communication Skills; Desire for Improvement   Sleep  Sleep: Sleep: Good    Physical Exam: Physical Exam Constitutional:      Appearance: Normal appearance.  HENT:     Head: Normocephalic and atraumatic.     Nose: Nose normal.     Mouth/Throat:     Mouth: Mucous membranes are moist.  Eyes:     Extraocular Movements: Extraocular movements intact.     Pupils: Pupils are equal, round, and reactive to light.  Cardiovascular:     Rate and Rhythm: Normal rate and regular rhythm.  Pulmonary:     Effort: Pulmonary effort is normal.     Breath sounds: Normal breath sounds.  Abdominal:     General: Abdomen is flat.  Musculoskeletal:        General: Normal range of motion.     Cervical back:  Normal range of motion and neck supple.  Skin:    General: Skin is warm and dry.  Neurological:     General: No focal deficit present.     Mental Status: She is alert and oriented to person, place, and time.  Psychiatric:        Attention and Perception: Attention and perception normal. She does not perceive auditory or visual  hallucinations.        Mood and Affect: Mood and affect normal. Mood is not anxious or depressed.        Speech: Speech normal.        Behavior: Behavior normal. Behavior is cooperative.        Thought Content: Thought content normal. Thought content is not paranoid or delusional. Thought content does not include homicidal or suicidal ideation.        Cognition and Memory: Cognition and memory normal.        Judgment: Judgment normal.    Review of Systems  Constitutional: Negative.   HENT: Negative.    Eyes: Negative.   Respiratory: Negative.    Cardiovascular: Negative.   Gastrointestinal: Negative.   Skin: Negative.   Neurological: Negative.   Psychiatric/Behavioral:  Positive for substance abuse. Negative for depression, hallucinations and suicidal ideas. The patient is not nervous/anxious and does not have insomnia.    Blood pressure 124/85, pulse 71, temperature 97.8 F (36.6 C), temperature source Oral, resp. rate 18, height '5\' 2"'$  (1.575 m), weight (!) 149.7 kg, SpO2 95 %. Body mass index is 60.36 kg/m.   Social History   Tobacco Use  Smoking Status Former   Packs/day: 0.50   Years: 30.00   Total pack years: 15.00   Types: Cigarettes   Quit date: 01/12/2022   Years since quitting: 0.2  Smokeless Tobacco Never   Tobacco Cessation:  N/A, patient does not currently use tobacco products   Blood Alcohol level:  Lab Results  Component Value Date   ETH <10 03/30/2022   ETH <10 61/44/3154    Metabolic Disorder Labs:  Lab Results  Component Value Date   HGBA1C 5.5 04/02/2022   MPG 111.15 04/02/2022   MPG 122.63 03/15/2022   No results  found for: "PROLACTIN" Lab Results  Component Value Date   CHOL 159 04/02/2022   TRIG 213 (H) 04/02/2022   HDL 37 (L) 04/02/2022   CHOLHDL 4.3 04/02/2022   VLDL 43 (H) 04/02/2022   LDLCALC 79 04/02/2022    See Psychiatric Specialty Exam and Suicide Risk Assessment completed by Attending Physician prior to discharge.  Discharge destination:  Other:  Patient being discharged to a friend  Is patient on multiple antipsychotic therapies at discharge:  No   Has Patient had three or more failed trials of antipsychotic monotherapy by history:  No  Recommended Plan for Multiple Antipsychotic Therapies: NA  Discharge Instructions     Diet - low sodium heart healthy   Complete by: As directed    Increase activity slowly   Complete by: As directed       Allergies as of 04/06/2022       Reactions   Latex Swelling, Rash, Other (See Comments)   Hands swell        Medication List     STOP taking these medications    butalbital-acetaminophen-caffeine 50-325-40 MG tablet Commonly known as: FIORICET   methocarbamol 500 MG tablet Commonly known as: ROBAXIN   ondansetron 4 MG tablet Commonly known as: ZOFRAN   topiramate 50 MG tablet Commonly known as: TOPAMAX       TAKE these medications      Indication  acetaminophen 325 MG tablet Commonly known as: TYLENOL Take 2 tablets (650 mg total) by mouth every 6 (six) hours as needed for mild pain (or Fever >/= 101).  Indication: Pain   albuterol 108 (90 Base) MCG/ACT inhaler Commonly known as: VENTOLIN HFA Inhale 2 puffs  into the lungs every 6 (six) hours as needed for wheezing or shortness of breath.  Indication: Asthma   amLODipine 5 MG tablet Commonly known as: NORVASC Take 1 tablet (5 mg total) by mouth daily.  Indication: High Blood Pressure Disorder   ARIPiprazole 10 MG tablet Commonly known as: ABILIFY Take 1 tablet (10 mg total) by mouth daily. Start taking on: April 07, 2022 What changed:  medication  strength how much to take  Indication: Major Depressive Disorder   budesonide-formoterol 80-4.5 MCG/ACT inhaler Commonly known as: SYMBICORT 2 puffs daily.  Indication: Asthma   docusate sodium 100 MG capsule Commonly known as: COLACE Take 2 capsules (200 mg total) by mouth daily. Start taking on: April 07, 2022  Indication: Constipation   FLUoxetine 40 MG capsule Commonly known as: PROZAC Take 1 capsule (40 mg total) by mouth daily. Start taking on: April 07, 2022 What changed: when to take this  Indication: Depression   furosemide 40 MG tablet Commonly known as: Lasix Take 1 tablet (40 mg total) by mouth daily.  Indication: High Blood Pressure Disorder   gabapentin 400 MG capsule Commonly known as: NEURONTIN Take 2 capsules (800 mg total) by mouth 3 (three) times daily.  Indication: Generalized Anxiety Disorder, Neuropathic Pain   hydrocortisone 2.5 % rectal cream Commonly known as: ANUSOL-HC Place rectally 3 (three) times daily. What changed:  how much to take when to take this reasons to take this  Indication: Inflamed Hemorrhoids   hydrOXYzine 50 MG tablet Commonly known as: ATARAX Take 1 tablet (50 mg total) by mouth 3 (three) times daily as needed for anxiety. What changed: when to take this  Indication: Feeling Anxious   ibuprofen 200 MG tablet Commonly known as: ADVIL Take 400 mg by mouth every 8 (eight) hours as needed for headache or mild pain.  Indication: Pain   lidocaine 5 % Commonly known as: LIDODERM Place 1 patch onto the skin daily. Remove & Discard patch within 12 hours or as directed by MD What changed: how much to take  Indication: Allodynia   lisinopril 10 MG tablet Commonly known as: ZESTRIL Take 1 tablet (10 mg total) by mouth in the morning.  Indication: High Blood Pressure Disorder   melatonin 5 MG Tabs Take 1 tablet (5 mg total) by mouth at bedtime.  Indication: Trouble Sleeping   nicotine polacrilex 2 MG  gum Commonly known as: NICORETTE Take 1 each (2 mg total) by mouth as needed for smoking cessation.  Indication: Nicotine Addiction   Ozempic (0.25 or 0.5 MG/DOSE) 2 MG/1.5ML Sopn Generic drug: Semaglutide(0.25 or 0.'5MG'$ /DOS) Inject 0.5 mg into the skin every Monday.  Indication: Type 2 Diabetes   pantoprazole 40 MG tablet Commonly known as: PROTONIX Take 1 tablet (40 mg total) by mouth daily. Start taking on: April 07, 2022  Indication: Heartburn   prazosin 1 MG capsule Commonly known as: MINIPRESS Take 1 capsule (1 mg total) by mouth at bedtime.  Indication: High Blood Pressure Disorder, Nightmares associated with PTSD   senna 8.6 MG Tabs tablet Commonly known as: SENOKOT Take 1 tablet (8.6 mg total) by mouth daily.  Indication: Constipation   traZODone 100 MG tablet Commonly known as: DESYREL Take 1 tablet (100 mg total) by mouth at bedtime.  Indication: Trouble Sleeping         Follow-up recommendations:    Activity: As tolerated  Diet: Heart healthy  Other: -Follow-up with your outpatient psychiatric provider -instructions on appointment date, time, and address (location) are  provided to you in discharge paperwork.   -Take your psychiatric medications as prescribed at discharge - instructions are provided to you in the discharge paperwork.    -Follow-up with outpatient primary care doctor and other specialists -for management of preventative medicine and chronic medical disease, including:   -Testing: Follow-up with outpatient provider for abnormal lab results:  none   -Recommend abstinence from alcohol, tobacco, and other illicit drug use at discharge.    -If your psychiatric symptoms recur, worsen, or if you have side effects to your psychiatric medications, call your outpatient psychiatric provider, 911, [phone] or go to the nearest emergency department.   -If suicidal thoughts recur, call your outpatient psychiatric provider, 911, [phone] or go to the  nearest emergency department.  Signed: Malachy Mood, PA 04/06/2022, 11:54 AM

## 2022-04-06 NOTE — [facility] Group Notes (Signed)
[facility] Group Notes:  (Nursing/MHT/Case Management/Adjunct)  Date:  04/06/2022  Time:  10:27 AM  Group Topic/Focus:  Goals Group: The focus of this group is to help patients establish daily goals to achieve during treatment and discuss how the patient can incorporate goal setting into their daily lives to aide in recovery.   Participation Level:  Did Not Attend  Summary of Progress/Problems:  Patient did not attend orientation/ goals group.   Elza Rafter 04/06/2022, 10:27 AM

## 2022-04-07 ENCOUNTER — Other Ambulatory Visit (HOSPITAL_COMMUNITY): Payer: Self-pay

## 2022-05-26 ENCOUNTER — Encounter (HOSPITAL_COMMUNITY): Payer: Self-pay

## 2022-05-26 ENCOUNTER — Other Ambulatory Visit: Payer: Self-pay

## 2022-05-26 ENCOUNTER — Emergency Department (HOSPITAL_COMMUNITY)
Admission: EM | Admit: 2022-05-26 | Discharge: 2022-05-27 | Disposition: A | Discharge status: Home / Self Care | Payer: 59 | Attending: Emergency Medicine | Admitting: Emergency Medicine

## 2022-05-26 ENCOUNTER — Emergency Department (HOSPITAL_COMMUNITY): Payer: 59

## 2022-05-26 DIAGNOSIS — F1410 Cocaine abuse, uncomplicated: Secondary | ICD-10-CM

## 2022-05-26 DIAGNOSIS — Z91040 Latex allergy status: Secondary | ICD-10-CM | POA: Insufficient documentation

## 2022-05-26 DIAGNOSIS — D72829 Elevated white blood cell count, unspecified: Secondary | ICD-10-CM | POA: Insufficient documentation

## 2022-05-26 DIAGNOSIS — M791 Myalgia, unspecified site: Secondary | ICD-10-CM | POA: Diagnosis not present

## 2022-05-26 DIAGNOSIS — F1490 Cocaine use, unspecified, uncomplicated: Secondary | ICD-10-CM | POA: Insufficient documentation

## 2022-05-26 DIAGNOSIS — F3176 Bipolar disorder, in full remission, most recent episode depressed: Secondary | ICD-10-CM | POA: Insufficient documentation

## 2022-05-26 DIAGNOSIS — R45851 Suicidal ideations: Secondary | ICD-10-CM | POA: Diagnosis not present

## 2022-05-26 DIAGNOSIS — R059 Cough, unspecified: Secondary | ICD-10-CM | POA: Insufficient documentation

## 2022-05-26 DIAGNOSIS — R0602 Shortness of breath: Secondary | ICD-10-CM | POA: Insufficient documentation

## 2022-05-26 DIAGNOSIS — R509 Fever, unspecified: Secondary | ICD-10-CM | POA: Insufficient documentation

## 2022-05-26 DIAGNOSIS — J111 Influenza due to unidentified influenza virus with other respiratory manifestations: Secondary | ICD-10-CM

## 2022-05-26 DIAGNOSIS — F1510 Other stimulant abuse, uncomplicated: Secondary | ICD-10-CM

## 2022-05-26 DIAGNOSIS — F3130 Bipolar disorder, current episode depressed, mild or moderate severity, unspecified: Secondary | ICD-10-CM | POA: Insufficient documentation

## 2022-05-26 DIAGNOSIS — J449 Chronic obstructive pulmonary disease, unspecified: Secondary | ICD-10-CM | POA: Insufficient documentation

## 2022-05-26 DIAGNOSIS — F1994 Other psychoactive substance use, unspecified with psychoactive substance-induced mood disorder: Secondary | ICD-10-CM

## 2022-05-26 DIAGNOSIS — Z1152 Encounter for screening for COVID-19: Secondary | ICD-10-CM | POA: Diagnosis not present

## 2022-05-26 DIAGNOSIS — Z5900 Homelessness unspecified: Secondary | ICD-10-CM | POA: Diagnosis not present

## 2022-05-26 LAB — RAPID URINE DRUG SCREEN, HOSP PERFORMED
Amphetamines: POSITIVE — AB
Barbiturates: NOT DETECTED
Benzodiazepines: NOT DETECTED
Cocaine: POSITIVE — AB
Opiates: NOT DETECTED
Tetrahydrocannabinol: NOT DETECTED

## 2022-05-26 LAB — CBC WITH DIFFERENTIAL/PLATELET
Abs Immature Granulocytes: 0.06 10*3/uL (ref 0.00–0.07)
Basophils Absolute: 0 10*3/uL (ref 0.0–0.1)
Basophils Relative: 0 %
Eosinophils Absolute: 0 10*3/uL (ref 0.0–0.5)
Eosinophils Relative: 0 %
HCT: 50.2 % — ABNORMAL HIGH (ref 36.0–46.0)
Hemoglobin: 16 g/dL — ABNORMAL HIGH (ref 12.0–15.0)
Immature Granulocytes: 1 %
Lymphocytes Relative: 24 %
Lymphs Abs: 2.9 10*3/uL (ref 0.7–4.0)
MCH: 30.1 pg (ref 26.0–34.0)
MCHC: 31.9 g/dL (ref 30.0–36.0)
MCV: 94.4 fL (ref 80.0–100.0)
Monocytes Absolute: 0.8 10*3/uL (ref 0.1–1.0)
Monocytes Relative: 7 %
Neutro Abs: 8.1 10*3/uL — ABNORMAL HIGH (ref 1.7–7.7)
Neutrophils Relative %: 68 %
Platelets: 306 10*3/uL (ref 150–400)
RBC: 5.32 MIL/uL — ABNORMAL HIGH (ref 3.87–5.11)
RDW: 14.5 % (ref 11.5–15.5)
WBC: 11.9 10*3/uL — ABNORMAL HIGH (ref 4.0–10.5)
nRBC: 0 % (ref 0.0–0.2)

## 2022-05-26 LAB — COMPREHENSIVE METABOLIC PANEL
ALT: 32 U/L (ref 0–44)
AST: 33 U/L (ref 15–41)
Albumin: 3 g/dL — ABNORMAL LOW (ref 3.5–5.0)
Alkaline Phosphatase: 50 U/L (ref 38–126)
Anion gap: 10 (ref 5–15)
BUN: 5 mg/dL — ABNORMAL LOW (ref 6–20)
CO2: 27 mmol/L (ref 22–32)
Calcium: 8.3 mg/dL — ABNORMAL LOW (ref 8.9–10.3)
Chloride: 105 mmol/L (ref 98–111)
Creatinine, Ser: 0.92 mg/dL (ref 0.44–1.00)
GFR, Estimated: >60 mL/min (ref >60)
Glucose, Bld: 127 mg/dL — ABNORMAL HIGH (ref 70–99)
Potassium: 3.8 mmol/L (ref 3.5–5.1)
Sodium: 142 mmol/L (ref 135–145)
Total Bilirubin: 0.5 mg/dL (ref 0.3–1.2)
Total Protein: 7.2 g/dL (ref 6.5–8.1)

## 2022-05-26 LAB — RESP PANEL BY RT-PCR (RSV, FLU A&B, COVID)  RVPGX2
Influenza A by PCR: NEGATIVE
Influenza B by PCR: NEGATIVE
Resp Syncytial Virus by PCR: NEGATIVE
SARS Coronavirus 2 by RT PCR: NEGATIVE

## 2022-05-26 LAB — ETHANOL: Alcohol, Ethyl (B): <10 mg/dL (ref <10)

## 2022-05-26 MED ORDER — ALBUTEROL SULFATE (2.5 MG/3ML) 0.083% IN NEBU: 2.5000 mg | INHALATION_SOLUTION | RESPIRATORY_TRACT | Status: DC | PRN

## 2022-05-26 MED ORDER — TRAZODONE HCL 100 MG PO TABS
100.0000 mg | ORAL_TABLET | Freq: Every day | ORAL | Status: DC
  Administered 2022-05-26: 100 mg via ORAL
  Filled 2022-05-26: qty 1

## 2022-05-26 MED ORDER — FLUOXETINE HCL 20 MG PO CAPS
40.0000 mg | ORAL_CAPSULE | Freq: Every day | ORAL | Status: DC
  Administered 2022-05-26 – 2022-05-27 (×2): 40 mg via ORAL
  Filled 2022-05-26 (×2): qty 2

## 2022-05-26 MED ORDER — PRAZOSIN HCL 1 MG PO CAPS
1.0000 mg | ORAL_CAPSULE | Freq: Every day | ORAL | Status: DC
  Administered 2022-05-26: 1 mg via ORAL
  Filled 2022-05-26 (×2): qty 1

## 2022-05-26 MED ORDER — LISINOPRIL 10 MG PO TABS
10.0000 mg | ORAL_TABLET | Freq: Every morning | ORAL | Status: DC
  Administered 2022-05-27: 10 mg via ORAL
  Filled 2022-05-26: qty 1

## 2022-05-26 MED ORDER — NICOTINE POLACRILEX 2 MG MT GUM: 2.0000 mg | CHEWING_GUM | OROMUCOSAL | Status: DC | PRN

## 2022-05-26 MED ORDER — GABAPENTIN 400 MG PO CAPS
400.0000 mg | ORAL_CAPSULE | Freq: Three times a day (TID) | ORAL | Status: DC
  Administered 2022-05-26 – 2022-05-27 (×3): 400 mg via ORAL
  Filled 2022-05-26 (×3): qty 1

## 2022-05-26 MED ORDER — ACETAMINOPHEN 325 MG PO TABS: 650.0000 mg | ORAL_TABLET | Freq: Four times a day (QID) | ORAL | Status: DC | PRN

## 2022-05-26 MED ORDER — ALBUTEROL SULFATE HFA 108 (90 BASE) MCG/ACT IN AERS: 2.0000 | INHALATION_SPRAY | Freq: Four times a day (QID) | RESPIRATORY_TRACT | Status: DC | PRN

## 2022-05-26 MED ORDER — ALBUTEROL SULFATE HFA 108 (90 BASE) MCG/ACT IN AERS
2.0000 | INHALATION_SPRAY | RESPIRATORY_TRACT | Status: DC | PRN
  Administered 2022-05-26: 2 via RESPIRATORY_TRACT
  Filled 2022-05-26: qty 6.7

## 2022-05-26 MED ORDER — HYDROXYZINE HCL 50 MG PO TABS: 50.0000 mg | ORAL_TABLET | Freq: Three times a day (TID) | ORAL | Status: DC | PRN

## 2022-05-26 MED ORDER — ARIPIPRAZOLE 10 MG PO TABS
10.0000 mg | ORAL_TABLET | Freq: Every day | ORAL | Status: DC
  Administered 2022-05-26 – 2022-05-27 (×2): 10 mg via ORAL
  Filled 2022-05-26 (×2): qty 1

## 2022-05-26 NOTE — ED Notes (Signed)
Pt alert, NAD, calm, ambulatory with O2 tank and [place], does not use home O2, c/o cough, congestion, sob, post tussive emesis, and diarrhea. Denies fever, or nausea. Has appetite. EAting a happy meal now. Recently up to b/r, no urine sample obtained. Endorses "hate my life and want to die", h/o same with attempt, and plan to OD on pills.

## 2022-05-26 NOTE — ED Notes (Addendum)
EDPA at [facility]. Pt arousable to voice, alert, NAD, calm, interactive.

## 2022-05-26 NOTE — ED Provider Notes (Signed)
[facility] EMERGENCY DEPARTMENT Provider Note   CSN: [id number] Arrival date & time: 05/26/22  1149     History  Chief Complaint  Patient presents with   Generalized Body Aches   Cough   Suicidal    Sarah Cervantes is a 41 y.o. female.  The history is provided by the patient and medical records. No language interpreter was used.  Cough    41 year old female significant history of polysubstance abuse including cocaine and heroin and alcohol abuse, bipolar, presenting today with 2 separate complaint.  Patient report for the past 3 to 4 days she has had cold symptoms including fever chills body aches congestion cough shortness of breath and chest tightness.  She tried over-the-counter medication without relief.  Patient also reported having suicidal thoughts with plan to overdose on pills.  Her last cocaine use was last night.  States she feels lonely.  She denies any homicidal ideation, auditory or visual hallucination.  No recent alcohol use.  She reports referrals hungry and requesting for sandwich to eat.  Home Medications Prior to Admission medications   Medication Sig Start Date End Date Taking? Authorizing Provider  acetaminophen (TYLENOL) 325 MG tablet Take 2 tablets (650 mg total) by mouth every 6 (six) hours as needed for mild pain (or Fever >/= 101). 03/16/22   Sheikh, Georgina Quint Latif, DO  albuterol (VENTOLIN HFA) 108 (90 Base) MCG/ACT inhaler Inhale 2 puffs into the lungs every 6 (six) hours as needed for wheezing or shortness of breath.    [provider]  amLODipine (NORVASC) 5 MG tablet Take 1 tablet (5 mg total) by mouth daily. 04/06/22 05/06/22  Massengill, Ovid Curd, MD  ARIPiprazole (ABILIFY) 10 MG tablet Take 1 tablet (10 mg total) by mouth daily. 04/07/22 05/07/22  Massengill, Ovid Curd, MD  budesonide-formoterol (SYMBICORT) 80-4.5 MCG/ACT inhaler 2 puffs daily. 02/28/22   [provider]  FLUoxetine (PROZAC) 40 MG capsule Take 1 capsule  (40 mg total) by mouth daily. 04/07/22 05/07/22  Massengill, Ovid Curd, MD  furosemide (LASIX) 40 MG tablet Take 1 tablet (40 mg total) by mouth daily. 04/06/22 05/06/22  Massengill, Ovid Curd, MD  gabapentin (NEURONTIN) 400 MG capsule Take 2 capsules (800 mg total) by mouth 3 (three) times daily. 04/06/22 05/06/22  Massengill, Ovid Curd, MD  hydrocortisone (ANUSOL-HC) 2.5 % rectal cream Place rectally 3 (three) times daily. Patient taking differently: Place 1 Application rectally daily as needed for hemorrhoids. 03/26/18   Charlynne Cousins, MD  hydrOXYzine (ATARAX) 50 MG tablet Take 1 tablet (50 mg total) by mouth 3 (three) times daily as needed for anxiety. 04/06/22   Massengill, Ovid Curd, MD  ibuprofen (ADVIL) 200 MG tablet Take 400 mg by mouth every 8 (eight) hours as needed for headache or mild pain.    [provider]  lidocaine (LIDODERM) 5 % Place 1 patch onto the skin daily. Remove & Discard patch within 12 hours or as directed by MD Patient taking differently: Place 2 patches onto the skin daily. Remove & Discard patch within 12 hours or as directed by MD 03/16/22   Raiford Noble Latif, DO  lisinopril (ZESTRIL) 10 MG tablet Take 1 tablet (10 mg total) by mouth in the morning. 04/06/22 05/06/22  Massengill, Ovid Curd, MD  nicotine polacrilex (NICORETTE) 2 MG gum Take 1 each (2 mg total) by mouth as needed for smoking cessation. 04/06/22   Massengill, Ovid Curd, MD  OZEMPIC, 0.25 OR 0.5 MG/DOSE, 2 MG/1.5ML SOPN Inject 0.5 mg into the skin every Monday.  [provider]  pantoprazole (PROTONIX) 40 MG tablet Take 1 tablet (40 mg total) by mouth daily. 04/07/22 05/07/22  Massengill, Ovid Curd, MD  prazosin (MINIPRESS) 1 MG capsule Take 1 capsule (1 mg total) by mouth at bedtime. 04/06/22 05/06/22  Massengill, Ovid Curd, MD  senna (SENOKOT) 8.6 MG TABS tablet Take 1 tablet (8.6 mg total) by mouth daily. 04/06/22   Massengill, Ovid Curd, MD  traZODone (DESYREL) 100 MG tablet Take 1 tablet (100 mg total) by  mouth at bedtime. 04/06/22 05/06/22  Janine Limbo, MD      Allergies    Latex    Review of Systems   Review of Systems  Respiratory:  Positive for cough.   All other systems reviewed and are negative.   Physical Exam Updated Vital Signs BP (!) 148/114 (BP Location: Right Arm)   Pulse 76   Temp 97.8 F (36.6 C) (Oral)   Resp (!) 21   Ht '5\' 4"'$  (1.626 m)   SpO2 91%   BMI 56.64 kg/m  Physical Exam Vitals and nursing note reviewed.  Constitutional:      General: She is not in acute distress.    Appearance: She is well-developed. She is obese.  HENT:     Head: Atraumatic.  Eyes:     Conjunctiva/sclera: Conjunctivae normal.  Cardiovascular:     Rate and Rhythm: Normal rate and regular rhythm.  Pulmonary:     Effort: Pulmonary effort is normal.     Breath sounds: Wheezing and rhonchi present. No rales.  Abdominal:     Palpations: Abdomen is soft.  Musculoskeletal:     Cervical back: Normal range of motion and neck supple. No rigidity or tenderness.  Skin:    Findings: No rash.  Neurological:     Mental Status: She is alert and oriented to person, place, and time.  Psychiatric:        Mood and Affect: Mood normal.        Speech: Speech normal.        Behavior: Behavior is cooperative.        Thought Content: Thought content is not paranoid. Thought content includes suicidal ideation. Thought content does not include homicidal ideation.     ED Results / Procedures / Treatments   Labs (all labs ordered are listed, but only abnormal results are displayed) Labs Reviewed  COMPREHENSIVE METABOLIC PANEL - Abnormal; Notable for the following components:      Result Value   Glucose, Bld 127 (*)    BUN 5 (*)    Calcium 8.3 (*)    Albumin 3.0 (*)    All other components within normal limits  CBC WITH DIFFERENTIAL/PLATELET - Abnormal; Notable for the following components:   WBC 11.9 (*)    RBC 5.32 (*)    Hemoglobin 16.0 (*)    HCT 50.2 (*)    Neutro Abs 8.1 (*)     All other components within normal limits  RESP PANEL BY RT-PCR (RSV, FLU A&B, COVID)  [id number]  ETHANOL  RAPID URINE DRUG SCREEN, HOSP PERFORMED    EKG EKG Interpretation  Date/Time:  Friday May 26 2022 12:00:12 EST Ventricular Rate:  81 PR Interval:  170 QRS Duration: 82 QT Interval:  386 QTC Calculation: 448 R Axis:   226 Text Interpretation: Normal sinus rhythm No significant change since last tracing Confirmed by Lajean Saver [phone]) on 05/26/2022 2:11:21 PM  Radiology DG Chest 2 View  Result Date: 05/26/2022 CLINICAL DATA:  Cough EXAM: CHEST - 2  VIEW COMPARISON:  04/03/2022 FINDINGS: Transverse diameter of heart is increased. There are no signs of pulmonary edema. There is no focal pulmonary consolidation. Small linear densities in right lower lung field may suggest scarring or subsegmental atelectasis. There is no significant pleural effusion or pneumothorax. IMPRESSION: Cardiomegaly. There are no signs of pulmonary edema or focal pulmonary consolidation. Electronically Signed   By: Elmer Picker M.D.   On: 05/26/2022 12:48    Procedures Procedures    Medications Ordered in ED Medications  albuterol (VENTOLIN HFA) 108 (90 Base) MCG/ACT inhaler 2 puff (has no administration in time range)    ED Course/ Medical Decision Making/ A&P                           Medical Decision Making Amount and/or Complexity of Data Reviewed Labs: ordered. Radiology: ordered.  Risk Prescription drug management.   BP (!) 148/114 (BP Location: Right Arm)   Pulse 76   Temp 97.8 F (36.6 C) (Oral)   Resp (!) 21   Ht '5\' 4"'$  (1.626 m)   SpO2 91%   BMI 56.64 kg/m   51:64 PM  41 year old female significant history of polysubstance abuse including cocaine and heroin and alcohol abuse, bipolar, presenting today with 2 separate complaint.  Patient report for the past 3 to 4 days she has had cold symptoms including fever chills body aches congestion cough shortness of breath  and chest tightness.  She tried over-the-counter medication without relief.  Patient also reported having suicidal thoughts with plan to overdose on pills.  Her last cocaine use was last night.  States she feels lonely.  She denies any homicidal ideation, auditory or visual hallucination.  No recent alcohol use.  She reports referrals hungry and requesting for sandwich to eat.  On exam this is an obese female laying in bed appears to be in no acute discomfort.  She is currently wearing supplemental oxygen.  Heart with normal rate and rhythm, lungs with expiratory wheezes, and scattered rhonchi.  Abdomen soft nontender.  Strength is equal throughout.  Vital signs remarkable for an O2 sats of 91% on room air initially, improved to 98% on 2 L of oxygen.  Labs and imaging obtained independently viewed interpreted by me ankle and I agree with radiologist interpretation.  Respiratory panel was negative for COVID flu RSV her electrolyte panels are reassuring white count is mildly elevated at 11.9 hemoglobin of 16.  Alcohol level is undetectable, UDS currently have not been processed yet the patient did report recent cocaine use.  Chest x-ray obtained showing cardiomegaly but no evidence of focal consolidation or pulmonary edema.  Patient given albuterol breathing treatment as well as cough medication.  Patient overall medically cleared and can be assessed further by psychiatry team due to her suicidal ideation.  -Labs ordered, independently viewed and interpreted by me.  Labs remarkable for UDS positive for cocaine and amphetamine -The patient was maintained on a cardiac monitor.  I personally viewed and interpreted the cardiac monitored which showed an underlying rhythm of: NSR -Imaging independently viewed and interpreted by me and I agree with radiologist's interpretation.  Result remarkable for CXR showing cardiomegaly, no pneumonia -This patient presents to the ED for concern of suicidal ideation, this  involves an extensive number of treatment options, and is a complaint that carries with it a high risk of complications and morbidity.  The differential diagnosis includes depression, anxiety, medication side effect, drug induced psychosis -Co  morbidities that complicate the patient evaluation includes polysubstance use, depression -Treatment includes supportive care -Reevaluation of the patient after these medicines showed that the patient improved -PCP office notes or outside notes reviewed -request TTS consult for further psych evaluation -Escalation to admission/observation considered: patient agreeable to be evaluated for psychiatric illness -hypoxia likely 2/2 to asthma exacerbation in the setting of flu like illness        Final Clinical Impression(s) / ED Diagnoses Final diagnoses:  Suicidal ideation  Influenza-like illness    Rx / DC Orders ED Discharge Orders     None         Domenic Moras, PA-C 05/26/22 1826    Lajean Saver, MD 05/27/22 [phone]

## 2022-05-26 NOTE — Consult Note (Signed)
Telepsych Consultation   Reason for Consult: Psych consult  Referring Physician:Tran, Gertie Fey, PA-C    Location of Patient: [facility] Location of Provider: Other: [facility]   Patient Identification: Sarah Cervantes Principal Diagnosis: Suicidal ideation Diagnosis:  Principal Problem:   Suicidal ideation Active Problems:   Cocaine use disorder ([place])   Bipolar I disorder, most recent episode depressed ([place])   Homelessness   Total Time spent with patient: 30 minutes  Subjective:   Sarah Cervantes is a 41 y.o. female patient with a past psychiatric history significant for PTSD, insomnia, anxiety, polysubstance abuse, bipolar depressed type and cocaine use disorder who presented to the [facility] via EMS with complaints of SI with a plan to overdose on pillls and "I hate my life and want to die." Patient ambulatory on arrival and required O2.  HPI: Patient seen and evaluated face-to-face by his provider, chart reviewed and case discussed with Dr. Dwyane Dee. On evaluation, patient is lying down in bed, alert and oriented x 4. Her thought process is linear and speech is clear and coherent. Her mood is depressed and affect is congruent. She has fair eye contact. She is calm and cooperative. She states that she feels hopeless and doesn't want to live anymore for the past 1 or 2 weeks. She states that she has felt alone for the Christmas and Thanksgiving holiday. She states that her family does not have anything to do with her due to her past legal issues, which has been going on for years. She states that she is currently homeless. She states that she was staying with a friend for a couple days but she can no longer return back to her friend's house because today she was told that it was too much for her to be living there because she gets sick a lot with her having COPD and high blood pressure. She states that she has not followed up with outpatient psychiatry or therapy. She states that she missed  her appointment. She is unable to recall the name of the outpatient facility where she is supposed to follow-up at for psychiatry and counseling. Per chart review, patient was hospitalized at [facility] from 03/31/22 to 04/06/22 for similar presentation and was discharged with recommended to follow up with [facility] outpatient services. She states that she stopped taking her medications about a week ago. However, she is unable to provide a specific reason as to why she stopped taking her mental health medications.  Per chart review, patient was prescribed Abilify 10 mg p.o. daily, Prozac 40 mg p.o. daily gabapentin 800 mg p.o. 3 times daily, Vistaril 50 mg p.o. 3 times daily as needed for anxiety, trazodone 100 mg p.o. nightly and prazosin 1 mg p.o. nightly while at [facility]. She reports worsening depression for the past week and describes her depressive symptoms as feeling hopeless, irritable, alone, poor sleep, and crying spells She denies homicidal ideations. She denies auditory or visual hallucinations. There is no objective evidence that the patient is currently responding to internal or external stimuli. She reports using cocaine the night before last. She reports using cocaine on average, at least twice per week, $50-$60 worth. She states that she rarely drinks alcohol and last time consumed alcohol 2 months ago.   I discussed with the patient at length the importance of staying compliant with current medication regimen for mood stabilization and the importance of following up with outpatient psychiatry and counseling for ongoing psychiatric treatment to address psychosocial stressors  and mental health concerns. Patient verbalizes understanding. Patient states that she needs a couple classes. I discussed with the patient outpatient therapy at the [facility] behavioral health outpatient clinic and the days and hours for open access to attend a session. Patient verbalizes understanding.  I explained to the patient community resources for shelters/housing and advised that I will add the resources to her discharge summary. Patient was advised that she will be held overnight and restarted on psychotropic medications for mood stabilization and be reevaluated by psychiatry on 05/27/2022.   Past Psychiatric History: history of PTSD, insomnia, anxiety, polysubstance abuse, bipolar depressed type and cocaine use disorder.   Risk to Self:  yes  Risk to Others:  denies  Prior Inpatient Therapy:  yes  Prior Outpatient Therapy:  yes   Past Medical History:  Past Medical History:  Diagnosis Date   Cocaine abuse, daily use ([place]) 03/23/2018   ETOH abuse 03/23/2018   Heroin abuse ([place]) 03/23/2018   History reviewed. No pertinent surgical history. Family History: History reviewed. No pertinent family history. Family Psychiatric  History: Mother has history of bipolar 1 and 2. Social History:  Social History   Substance and Sexual Activity  Alcohol Use Yes   Alcohol/week: 12.0 - 15.0 standard drinks of alcohol   Types: 12 - 15 Cans of beer per week   Comment: drinking daily for the past week     Social History   Substance and Sexual Activity  Drug Use Yes   Types: Cocaine    Social History   Socioeconomic History   Marital status: Single    Spouse name: Not on file   Number of children: Not on file   Years of education: Not on file   Highest education level: Not on file  Occupational History   Not on file  Tobacco Use   Smoking status: Former    Packs/day: 0.50    Years: 30.00    Total pack years: 15.00    Types: Cigarettes    Quit date: 01/12/2022    Years since quitting: 0.3   Smokeless tobacco: Never  Vaping Use   Vaping Use: Never used  Substance and Sexual Activity   Alcohol use: Yes    Alcohol/week: 12.0 - 15.0 standard drinks of alcohol    Types: 12 - 15 Cans of beer per week    Comment: drinking daily for the past week   Drug use: Yes    Types:  Cocaine   Sexual activity: Yes    Birth control/protection: None  Other Topics Concern   Not on file  Social History Narrative   Not on file   Social Determinants of Health   Financial Resource Strain: Not on file  Food Insecurity: Unknown (03/31/2022)   Hunger Vital Sign    Worried About Running Out of Food in the Last Year: Patient refused    Ran Out of Food in the Last Year: Patient refused  Recent Concern: Food Insecurity - Food Insecurity Present (03/15/2022)   Hunger Vital Sign    Worried About Running Out of Food in the Last Year: Often true    Ran Out of Food in the Last Year: Often true  Transportation Needs: Unknown (03/31/2022)   PRAPARE - Hydrologist (Medical): Patient refused    Lack of Transportation (Non-Medical): Patient refused  Recent Concern: Transportation Needs - Unmet Transportation Needs (03/15/2022)   PRAPARE - Hydrologist (Medical): Yes  Lack of Transportation (Non-Medical): Yes  Physical Activity: Not on file  Stress: Not on file  Social Connections: Not on file   Additional Social History:    Allergies:   Allergies  Allergen Reactions   Latex Swelling, Rash and Other (See Comments)    Hands swell    Labs:  Results for orders placed or performed during the hospital encounter of 05/26/22 (from the past 48 hour(s))  Resp panel by RT-PCR (RSV, Flu A&B, Covid) Anterior Nasal Swab     Status: None   Collection Time: 05/26/22 12:26 PM   Specimen: Anterior Nasal Swab  Result Value Ref Range   SARS Coronavirus 2 by RT PCR NEGATIVE NEGATIVE    Comment: (NOTE) SARS-CoV-2 target nucleic acids are NOT DETECTED.  The SARS-CoV-2 RNA is generally detectable in upper respiratory specimens during the acute phase of infection. The lowest concentration of SARS-CoV-2 viral copies this assay can detect is 138 copies/mL. A negative result does not preclude SARS-Cov-2 infection and should not be used  as the sole basis for treatment or other patient management decisions. A negative result may occur with  improper specimen collection/handling, submission of specimen other than nasopharyngeal swab, presence of viral mutation(s) within the areas targeted by this assay, and inadequate number of viral copies(<138 copies/mL). A negative result must be combined with clinical observations, patient history, and epidemiological information. The expected result is Negative.  Fact Sheet for Patients:  EntrepreneurPulse.com.au  Fact Sheet for Healthcare Providers:  IncredibleEmployment.be  This test is no t yet approved or cleared by the [place] FDA and  has been authorized for detection and/or diagnosis of SARS-CoV-2 by FDA under an Emergency Use Authorization (EUA). This EUA will remain  in effect (meaning this test can be used) for the duration of the COVID-19 declaration under Section 564(b)(1) of the Act, 21 U.S.C.section 360bbb-3(b)(1), unless the authorization is terminated  or revoked sooner.       Influenza A by PCR NEGATIVE NEGATIVE   Influenza B by PCR NEGATIVE NEGATIVE    Comment: (NOTE) The Xpert Xpress SARS-CoV-2/FLU/RSV plus assay is intended as an aid in the diagnosis of influenza from Nasopharyngeal swab specimens and should not be used as a sole basis for treatment. Nasal washings and aspirates are unacceptable for Xpert Xpress SARS-CoV-2/FLU/RSV testing.  Fact Sheet for Patients: EntrepreneurPulse.com.au  Fact Sheet for Healthcare Providers: IncredibleEmployment.be  This test is not yet approved or cleared by the [place] FDA and has been authorized for detection and/or diagnosis of SARS-CoV-2 by FDA under an Emergency Use Authorization (EUA). This EUA will remain in effect (meaning this test can be used) for the duration of the COVID-19 declaration under Section 564(b)(1) of the Act,  21 U.S.C. section 360bbb-3(b)(1), unless the authorization is terminated or revoked.     Resp Syncytial Virus by PCR NEGATIVE NEGATIVE    Comment: (NOTE) Fact Sheet for Patients: EntrepreneurPulse.com.au  Fact Sheet for Healthcare Providers: IncredibleEmployment.be  This test is not yet approved or cleared by the [place] FDA and has been authorized for detection and/or diagnosis of SARS-CoV-2 by FDA under an Emergency Use Authorization (EUA). This EUA will remain in effect (meaning this test can be used) for the duration of the COVID-19 declaration under Section 564(b)(1) of the Act, 21 U.S.C. section 360bbb-3(b)(1), unless the authorization is terminated or revoked.  Performed at [facility], [place] [address], [place], [place] [address]   Comprehensive metabolic panel     Status: Abnormal  Collection Time: 05/26/22 12:26 PM  Result Value Ref Range   Sodium 142 135 - 145 mmol/L   Potassium 3.8 3.5 - 5.1 mmol/L   Chloride 105 98 - 111 mmol/L   CO2 27 22 - 32 mmol/L   Glucose, Bld 127 (H) 70 - 99 mg/dL    Comment: Glucose reference range applies only to samples taken after fasting for at least 8 hours.   BUN 5 (L) 6 - 20 mg/dL   Creatinine, Ser 0.92 0.44 - 1.00 mg/dL   Calcium 8.3 (L) 8.9 - 10.3 mg/dL   Total Protein 7.2 6.5 - 8.1 g/dL   Albumin 3.0 (L) 3.5 - 5.0 g/dL   AST 33 15 - 41 U/L   ALT 32 0 - 44 U/L   Alkaline Phosphatase 50 38 - 126 U/L   Total Bilirubin 0.5 0.3 - 1.2 mg/dL   GFR, Estimated >60 >60 mL/min    Comment: (NOTE) Calculated using the CKD-EPI Creatinine Equation (2021)    Anion gap 10 5 - 15    Comment: Performed at [facility], [place] [address], [place], [place] [address]  CBC with Differential     Status: Abnormal   Collection Time: 05/26/22 12:26 PM  Result Value Ref Range   WBC 11.9 (H) 4.0 - 10.5 K/uL   RBC 5.32 (H) 3.87 - 5.11 MIL/uL   Hemoglobin 16.0 (H) 12.0 - 15.0 g/dL   HCT 50.2 (H)  36.0 - 46.0 %   MCV 94.4 80.0 - 100.0 fL   MCH 30.1 26.0 - 34.0 pg   MCHC 31.9 30.0 - 36.0 g/dL   RDW 14.5 11.5 - 15.5 %   Platelets 306 150 - 400 K/uL   nRBC 0.0 0.0 - 0.2 %   Neutrophils Relative % 68 %   Neutro Abs 8.1 (H) 1.7 - 7.7 K/uL   Lymphocytes Relative 24 %   Lymphs Abs 2.9 0.7 - 4.0 K/uL   Monocytes Relative 7 %   Monocytes Absolute 0.8 0.1 - 1.0 K/uL   Eosinophils Relative 0 %   Eosinophils Absolute 0.0 0.0 - 0.5 K/uL   Basophils Relative 0 %   Basophils Absolute 0.0 0.0 - 0.1 K/uL   Immature Granulocytes 1 %   Abs Immature Granulocytes 0.06 0.00 - 0.07 K/uL    Comment: Performed at [facility], [place] [address] [place], [place] [address]  Ethanol     Status: None   Collection Time: 05/26/22 12:26 PM  Result Value Ref Range   Alcohol, Ethyl (B) <10 <10 mg/dL    Comment: (NOTE) Lowest detectable limit for serum alcohol is 10 mg/dL.  For medical purposes only. Performed at [facility], [place] [address] [place], [place] [address]   Urine rapid drug screen (hosp performed)     Status: Abnormal   Collection Time: 05/26/22  4:26 PM  Result Value Ref Range   Opiates NONE DETECTED NONE DETECTED   Cocaine POSITIVE (A) NONE DETECTED   Benzodiazepines NONE DETECTED NONE DETECTED   Amphetamines POSITIVE (A) NONE DETECTED   Tetrahydrocannabinol NONE DETECTED NONE DETECTED   Barbiturates NONE DETECTED NONE DETECTED    Comment: (NOTE) DRUG SCREEN FOR MEDICAL PURPOSES ONLY.  IF CONFIRMATION IS NEEDED FOR ANY PURPOSE, NOTIFY LAB WITHIN 5 DAYS.  LOWEST DETECTABLE LIMITS FOR URINE DRUG SCREEN Drug Class                     Cutoff (ng/mL) Amphetamine and metabolites  1000 Barbiturate and metabolites    200 Benzodiazepine                 200 Opiates and metabolites        300 Cocaine and metabolites        300 THC                            50 Performed at [facility], [place] [address] [place], [place] [address]     Medications:  Current  Facility-Administered Medications  Medication Dose Route Frequency Provider Last Rate Last Admin   albuterol (VENTOLIN HFA) 108 (90 Base) MCG/ACT inhaler 2 puff  2 puff Inhalation Q4H PRN Domenic Moras, PA-C   2 puff at 05/26/22 1715   ARIPiprazole (ABILIFY) tablet 10 mg  10 mg Oral Daily Harolyn Cocker L, NP   10 mg at 05/26/22 1715   FLUoxetine (PROZAC) capsule 40 mg  40 mg Oral Daily Antwone Capozzoli L, NP   40 mg at 05/26/22 1715   gabapentin (NEURONTIN) capsule 400 mg  400 mg Oral TID Annaleese Guier L, NP   400 mg at 05/26/22 1715   hydrOXYzine (ATARAX) tablet 50 mg  50 mg Oral TID PRN Jrue Yambao L, NP       prazosin (MINIPRESS) capsule 1 mg  1 mg Oral QHS Maurica Omura L, NP       traZODone (DESYREL) tablet 100 mg  100 mg Oral QHS Lida Berkery L, NP       Current Outpatient Medications  Medication Sig Dispense Refill   acetaminophen (TYLENOL) 325 MG tablet Take 2 tablets (650 mg total) by mouth every 6 (six) hours as needed for mild pain (or Fever >/= 101). 20 tablet 0   albuterol (VENTOLIN HFA) 108 (90 Base) MCG/ACT inhaler Inhale 2 puffs into the lungs every 6 (six) hours as needed for wheezing or shortness of breath.     amLODipine (NORVASC) 5 MG tablet Take 1 tablet (5 mg total) by mouth daily. 30 tablet 0   ARIPiprazole (ABILIFY) 10 MG tablet Take 1 tablet (10 mg total) by mouth daily. 30 tablet 0   budesonide-formoterol (SYMBICORT) 80-4.5 MCG/ACT inhaler 2 puffs daily.     FLUoxetine (PROZAC) 40 MG capsule Take 1 capsule (40 mg total) by mouth daily. 30 capsule 0   furosemide (LASIX) 40 MG tablet Take 1 tablet (40 mg total) by mouth daily. 30 tablet 0   gabapentin (NEURONTIN) 400 MG capsule Take 2 capsules (800 mg total) by mouth 3 (three) times daily. 180 capsule 0   hydrocortisone (ANUSOL-HC) 2.5 % rectal cream Place rectally 3 (three) times daily. (Patient taking differently: Place 1 Application rectally daily as needed for hemorrhoids.) 30 g 0   hydrOXYzine (ATARAX) 50 MG  tablet Take 1 tablet (50 mg total) by mouth 3 (three) times daily as needed for anxiety. 30 tablet 0   ibuprofen (ADVIL) 200 MG tablet Take 400 mg by mouth every 8 (eight) hours as needed for headache or mild pain.     lidocaine (LIDODERM) 5 % Place 1 patch onto the skin daily. Remove & Discard patch within 12 hours or as directed by MD (Patient taking differently: Place 2 patches onto the skin daily. Remove & Discard patch within 12 hours or as directed by MD) 30 patch 0   lisinopril (ZESTRIL) 10 MG tablet Take 1 tablet (10 mg total) by mouth in the morning. 30 tablet  0   nicotine polacrilex (NICORETTE) 2 MG gum Take 1 each (2 mg total) by mouth as needed for smoking cessation. 100 tablet 0   OZEMPIC, 0.25 OR 0.5 MG/DOSE, 2 MG/1.5ML SOPN Inject 0.5 mg into the skin every Monday.     pantoprazole (PROTONIX) 40 MG tablet Take 1 tablet (40 mg total) by mouth daily. 30 tablet 0   prazosin (MINIPRESS) 1 MG capsule Take 1 capsule (1 mg total) by mouth at bedtime. 30 capsule 0   senna (SENOKOT) 8.6 MG TABS tablet Take 1 tablet (8.6 mg total) by mouth daily. 30 tablet 0   traZODone (DESYREL) 100 MG tablet Take 1 tablet (100 mg total) by mouth at bedtime. 30 tablet 0    Psychiatric Specialty Exam:  Presentation  General Appearance:  Appropriate for Environment  Eye Contact: Fair  Speech: Clear and Coherent  Speech Volume: Normal  Handedness: Right   Mood and Affect  Mood: Depressed  Affect: Congruent   Thought Process  Thought Processes: Coherent  Descriptions of Associations:Intact  Orientation:Full (Time, Place and Person)  Thought Content:Logical  History of Schizophrenia/Schizoaffective disorder:No  Duration of Psychotic Symptoms:N/A  Hallucinations:Hallucinations: None  Ideas of Reference:None  Suicidal Thoughts:Suicidal Thoughts: Yes, Active  Homicidal Thoughts:Homicidal Thoughts: No   Sensorium  Memory: Immediate Fair; Recent Fair; Remote  Fair  Judgment: Poor  Insight: Lacking   Executive Functions  Concentration: Fair  Attention Span: Fair  Recall: [employer] of Knowledge: Fair  Language: Fair   Psychomotor Activity  Psychomotor Activity: Psychomotor Activity: Normal   Assets  Assets: Communication Skills; Desire for Improvement   Sleep  Sleep: Sleep: Fair Number of Hours of Sleep: 2   Physical Exam: Physical Exam Cardiovascular:     Rate and Rhythm: Normal rate.     Comments: Hypertensive  Pulmonary:     Comments: Currently on nasal cannula oxygen. O2 is 91 Neurological:     Mental Status: She is alert and oriented to person, place, and time.    Review of Systems  Constitutional:  Positive for chills and malaise/fatigue.  Respiratory:  Positive for cough.    Blood pressure (!) 148/114, pulse 76, temperature 97.8 F (36.6 C), temperature source Oral, resp. rate (!) 21, height '5\' 4"'$  (1.626 m), SpO2 91 %. Body mass index is 56.64 kg/m.  Treatment Plan Summary: Daily contact with patient to assess and evaluate symptoms and progress in treatment, Medication management, and Plan overnight observation for medication management and mood stabilization.Patient to remain in the ED as she is not medically stable, B/P 148/114 and requires nasal cannula oxygen, O2 is 91%    Medication regimen:  Restart Abilify 10 mg p.o. daily Restart her Prozac 40 mg p.o. daily Restart gabapentin at 400 mg p.o. 3 times daily, consider titrating to home dose of 800 mg p.o. 3 times daily Restart Vistaril 50 mg p.o. 3 times daily as needed for anxiety Restart prazosin 1 mg p.o. nightly Restart trazodone 100 mg p.o. nightly  Disposition:  overnight observation for medication management and mood stabilization. Re-evaluation by psychiatry on 05/27/22  This service was provided via telemedicine using a 2-way, interactive audio and video technology.  Names of all persons participating in this telemedicine  service and their role in this encounter. Name: [name]  Role: Patient   Name: Darrol Angel Role: NP  Name:  Role:   Name:  Role:     Marissa Calamity, NP 05/26/2022 5:16 PM

## 2022-05-26 NOTE — ED Notes (Signed)
Pt belongings placed in locker number 5

## 2022-05-26 NOTE — ED Triage Notes (Addendum)
Pt BIB [facility] c/o chills, cough, congestion and body aches x several days. PT also endorses SI and states she hates her life and wants to die.

## 2022-05-26 NOTE — ED Provider Triage Note (Signed)
Emergency Medicine Provider Triage Evaluation Note  Sarah Cervantes , a 41 y.o. female  was evaluated in triage.  Pt complains of cold sxs. Report myalgia, fever, chills, cough, congestion for the past 3 days. Furthermore, report having suicidal ideation as well with plan to overdose on pills.  No HI, or AVH  Review of Systems  Positive: As above Negative: As above  Physical Exam  BP (!) 163/119   Pulse 78   Temp 98.4 F (36.9 C)   Resp (!) 24   Ht '5\' 4"'$  (1.626 m)   SpO2 90%   BMI 56.64 kg/m  Gen:   Awake, no distress   Resp:  Normal effort  MSK:   Moves extremities without difficulty  Other:    Medical Decision Making  Medically screening exam initiated at 11:59 AM.  Appropriate orders placed.  Sarah Cervantes was informed that the remainder of the evaluation will be completed by another provider, this initial triage assessment does not replace that evaluation, and the importance of remaining in the ED until their evaluation is complete.     Domenic Moras, PA-C 05/26/22 1204

## 2022-05-27 ENCOUNTER — Encounter (HOSPITAL_COMMUNITY): Payer: Self-pay

## 2022-05-27 ENCOUNTER — Emergency Department (HOSPITAL_COMMUNITY): Payer: 59

## 2022-05-27 ENCOUNTER — Emergency Department (HOSPITAL_COMMUNITY)
Admission: EM | Admit: 2022-05-27 | Discharge: 2022-05-28 | Disposition: A | Discharge status: Home / Self Care | Payer: 59 | Source: Home / Self Care | Attending: Emergency Medicine | Admitting: Emergency Medicine

## 2022-05-27 DIAGNOSIS — R059 Cough, unspecified: Secondary | ICD-10-CM | POA: Insufficient documentation

## 2022-05-27 DIAGNOSIS — J449 Chronic obstructive pulmonary disease, unspecified: Secondary | ICD-10-CM | POA: Insufficient documentation

## 2022-05-27 DIAGNOSIS — Z91040 Latex allergy status: Secondary | ICD-10-CM | POA: Insufficient documentation

## 2022-05-27 LAB — CBC WITH DIFFERENTIAL/PLATELET
Abs Immature Granulocytes: 0.15 10*3/uL — ABNORMAL HIGH (ref 0.00–0.07)
Basophils Absolute: 0 10*3/uL (ref 0.0–0.1)
Basophils Relative: 0 %
Eosinophils Absolute: 0 10*3/uL (ref 0.0–0.5)
Eosinophils Relative: 0 %
HCT: 49.8 % — ABNORMAL HIGH (ref 36.0–46.0)
Hemoglobin: 15.6 g/dL — ABNORMAL HIGH (ref 12.0–15.0)
Immature Granulocytes: 1 %
Lymphocytes Relative: 21 %
Lymphs Abs: 3.6 10*3/uL (ref 0.7–4.0)
MCH: 29.9 pg (ref 26.0–34.0)
MCHC: 31.3 g/dL (ref 30.0–36.0)
MCV: 95.6 fL (ref 80.0–100.0)
Monocytes Absolute: 1.5 10*3/uL — ABNORMAL HIGH (ref 0.1–1.0)
Monocytes Relative: 8 %
Neutro Abs: 12.2 10*3/uL — ABNORMAL HIGH (ref 1.7–7.7)
Neutrophils Relative %: 70 %
Platelets: 285 10*3/uL (ref 150–400)
RBC: 5.21 MIL/uL — ABNORMAL HIGH (ref 3.87–5.11)
RDW: 14.7 % (ref 11.5–15.5)
WBC: 17.4 10*3/uL — ABNORMAL HIGH (ref 4.0–10.5)
nRBC: 0 % (ref 0.0–0.2)

## 2022-05-27 LAB — BASIC METABOLIC PANEL
Anion gap: 11 (ref 5–15)
BUN: 9 mg/dL (ref 6–20)
CO2: 27 mmol/L (ref 22–32)
Calcium: 8.8 mg/dL — ABNORMAL LOW (ref 8.9–10.3)
Chloride: 99 mmol/L (ref 98–111)
Creatinine, Ser: 0.85 mg/dL (ref 0.44–1.00)
GFR, Estimated: >60 mL/min (ref >60)
Glucose, Bld: 116 mg/dL — ABNORMAL HIGH (ref 70–99)
Potassium: 3.7 mmol/L (ref 3.5–5.1)
Sodium: 137 mmol/L (ref 135–145)

## 2022-05-27 LAB — TROPONIN I (HIGH SENSITIVITY): Troponin I (High Sensitivity): 4 ng/L (ref <18)

## 2022-05-27 LAB — BRAIN NATRIURETIC PEPTIDE: B Natriuretic Peptide: 190.3 pg/mL — ABNORMAL HIGH (ref 0.0–100.0)

## 2022-05-27 MED ORDER — IPRATROPIUM-ALBUTEROL 0.5-2.5 (3) MG/3ML IN SOLN
3.0000 mL | Freq: Once | RESPIRATORY_TRACT | Status: AC
  Administered 2022-05-27: 3 mL via RESPIRATORY_TRACT
  Filled 2022-05-27: qty 3

## 2022-05-27 MED ORDER — ALBUTEROL SULFATE (2.5 MG/3ML) 0.083% IN NEBU
2.5000 mg | INHALATION_SOLUTION | Freq: Once | RESPIRATORY_TRACT | Status: AC
  Administered 2022-05-27: 2.5 mg via RESPIRATORY_TRACT
  Filled 2022-05-27: qty 3

## 2022-05-27 MED ORDER — ALBUTEROL SULFATE HFA 108 (90 BASE) MCG/ACT IN AERS
2.0000 | INHALATION_SPRAY | Freq: Once | RESPIRATORY_TRACT | Status: AC
  Administered 2022-05-28: 2 via RESPIRATORY_TRACT
  Filled 2022-05-27: qty 6.7

## 2022-05-27 MED ORDER — PREDNISONE 20 MG PO TABS
60.0000 mg | ORAL_TABLET | Freq: Once | ORAL | Status: AC
  Administered 2022-05-28: 60 mg via ORAL
  Filled 2022-05-27: qty 3

## 2022-05-27 MED ORDER — PREDNISONE 10 MG PO TABS
20.0000 mg | ORAL_TABLET | Freq: Every day | ORAL | 0 refills | Status: DC
  Filled 2022-05-27: qty 15, 7d supply, fill #0

## 2022-05-27 NOTE — Discharge Instructions (Addendum)
It was our pleasure to provide your ER care today - we hope that you feel better.  Avoid drug use as it is harmful to your physical health and mental well-being. See resource guide attached in terms of accessing inpatient or outpatient substance use treatment programs.   Follow up closely with primary care doctor and behavioral health provider in the coming week.  For mental health issues and/or crisis, you may also go directly to the [facility] - they are open 24/7 and walk-ins are welcome.    Return to ER if worse, new symptoms, fevers, chest pain, trouble breathing, or other emergency concern.    Homeless Shelter List:   [place]) [place], [place] Phone: [phone]   [facility] has been providing emergency shelter to those in need of a permanent residence for over 35 years. The [facility] shelter plays an important role in our community.    There are many life events that can pull someone into a downward spiral towards poverty that is very difficult to get out of. Homelessness is a problem that can affect anyone of [place]. [facility] is a safe and comforting place to stay, especially if you have experienced the hardship of street life.    [facility] provides a single bed and bedding to 100 adult men and women. The shelter welcomes all who are in need of housing, no one in real need is turned away unless space is not available.    While staying at [facility], guests are offered more than just a bed for a night. Hot meals are provided and every guest has access to case management services. Case managers provide assistance with finding housing, employment, or other services that will help them gain stability. Continuous stay is based on availability, capacity, and progress towards goals.    To contact the front desk of [facility] please call   [phone] ext [phone] or  ext. [phone].   [facility] [address] [place], [place] [address] Phone: [phone]   [facility] (Women only) [address] [place], [place] [address] Phone: [address] [place], [place] [address] Phone: [phone]   [place]: [phone]. [address] [place], [place] [address] Phone: [phone]   [facility] [address] [place], [place] [address] Check in at 6:00PM for placement at a local shelter) Phone: [phone]

## 2022-05-27 NOTE — ED Notes (Signed)
PT speaking with NP via TTS.

## 2022-05-27 NOTE — ED Triage Notes (Signed)
Pt presents with c/o cough for 6 days.

## 2022-05-27 NOTE — ED Notes (Signed)
This RN assumed care of patient. She is sleeping on bed at this time. NAD noted.

## 2022-05-27 NOTE — Progress Notes (Signed)
CSW provided the following resources:   Homeless Shelter List:   [facility] ([place]) [place], [place] Phone: [phone]   [facility] has been providing emergency shelter to those in need of a permanent residence for over 35 years. The [facility] shelter plays an important role in our community.    There are many life events that can pull someone into a downward spiral towards poverty that is very difficult to get out of. Homelessness is a problem that can affect anyone of [place]. [facility] is a safe and comforting place to stay, especially if you have experienced the hardship of street life.    [facility] provides a single bed and bedding to 100 adult men and women. The shelter welcomes all who are in need of housing, no one in real need is turned away unless space is not available.    While staying at [facility], guests are offered more than just a bed for a night. Hot meals are provided and every guest has access to case management services. Case managers provide assistance with finding housing, employment, or other services that will help them gain stability. Continuous stay is based on availability, capacity, and progress towards goals.    To contact the front desk of [facility] please call   [phone] ext [phone] or ext. [phone].   [facility] [address] [place], [place] [address] Phone: [phone]   [facility] (Women only) [address] [place], [place] [address] Phone: [address] [place], [place] [address] Phone: [phone]   [place]: [phone]. [address] [place], [place] [address] Phone: [phone]   [facility] [address], [place],  [address] Check in at 6:00PM for placement at a local shelter) Phone: [place],  MSW, Laurence Compton Phone: [phone] Disposition/TOC

## 2022-05-27 NOTE — ED Provider Triage Note (Signed)
Emergency Medicine Provider Triage Evaluation Note  Sarah Cervantes , a 41 y.o. female  was evaluated in triage.  Pt complains of shortness of breath and painful cough.  Patient was diagnosed with influenza-like illness.  She has been seen within the last 24 hours and worked up for her flulike symptoms as well as been seen by [facility] who felt she was medically clear.  Patient complains of progressive shortness of breath, painful cough.  She has been taking DayQuil NyQuil without any relief of her symptoms..  Review of Systems  Positive: Painful cough Negative: fever  Physical Exam  BP (!) 201/100 (BP Location: Right Arm)   Pulse 90   Temp 99.7 F (37.6 C) (Oral)   Resp 18   SpO2 96%  Gen:   Awake, no distress  Resp:  Normal effort  MSK:   Moves extremities without difficulty  Other:  Wheezing, ronchi  Medical Decision Making  Medically screening exam initiated at 4:08 PM.  Appropriate orders placed.  [name] was informed that the remainder of the evaluation will be completed by another provider, this initial triage assessment does not replace that evaluation, and the importance of remaining in the ED until their evaluation is complete.     Margarita Mail, PA-C 05/27/22 1609

## 2022-05-27 NOTE — ED Notes (Signed)
Pt up to bathroom with stable gait.  

## 2022-05-27 NOTE — Consult Note (Cosign Needed)
Telepsych Consultation   Reason for Consult:  Telepsych Reassessment for SI Referring Physician:  Doy Hutching Location of Patient:    [facility] Location of Provider: Other: virtual home office  Patient Identification: Sarah Cervantes MRN:  [id number] Principal Diagnosis: Suicidal ideation Diagnosis:  Principal Problem:   Suicidal ideation Active Problems:   Cocaine use disorder ([place])   Bipolar I disorder, most recent episode depressed ([place])   Homelessness   Total Time spent with patient: 30 minutes  Subjective:   Sarah Cervantes is a 41 y.o. female patient admitted with cough, body aches and suicidal ideation with plan to overdose on pills.  Her admission UDS was + for cocaine --pt reported recent usage.   HPI:   Patient seen via telepsych by this provider; chart reviewed and consulted with Dr. Dwyane Dee on 05/27/22.  On evaluation Sarah Cervantes reports she slept okay except for the black bugs that she reports seeing when she tries to go to sleep.  She reports no one else can see the bugs but she's been seeing them for >1 year.  Reports visual hallucinations and suicidal ideations get worse when she uses illicit substances but improves when she does not.    Otherwise she reports, doing okay.  Appetite is good, states she ate sausage and toast for breakfast. Pt report she uses cocaine daily, and has had multiple attempts at sobriety.  Most recent was Sept 2023 but unfortunately she relapsed triggered by going to old neighborhoods/friends who use.  States she's homeless and has been homeless for years. She usually stays at various friends homes.  Prior to admission, she was staying with a female friend for several months but she reports he got a new girlfriend who did not want her to stay there anymore.  At present, she is unsure where she will go.  She is not working but reports she pan handles or her aunt will send her some money that she uses for illicit street drugs.    During  evaluation Sarah Cervantes is laying upright in bed; her stature is obese and she's dressed in a hospital gown with the sheets up to her chest. She's fairly groomed.  She is alert/oriented x 4; anxious but cooperative; and mood congruent with affect.  Patient is speaking in a clear tone at moderate volume, and normal pace; with good eye contact.  Her thought process is coherent and relevant; There is no indication that she is currently responding to internal/external stimuli or experiencing delusional thought content.  Patient endorses suicidal ideation, passive.  She denies homicidal ideation, and paranoia.  Patient has remained calm throughout assessment and has answered questions appropriately.   Per ED Provider Admission Assessment 05/26/2022: Chief Complaint  Patient presents with   Generalized Body Aches   Cough   Suicidal      Sarah Cervantes is a 41 y.o. female.   The history is provided by the patient and medical records. No language interpreter was used.  Cough      41 year old female significant history of polysubstance abuse including cocaine and heroin and alcohol abuse, bipolar, presenting today with 2 separate complaint.  Patient report for the past 3 to 4 days she has had cold symptoms including fever chills body aches congestion cough shortness of breath and chest tightness.  She tried over-the-counter medication without relief.  Patient also reported having suicidal thoughts with plan to overdose on pills.  Her last cocaine use was last night.  States she feels lonely.  She  denies any homicidal ideation, auditory or visual hallucination.  No recent alcohol use.  She reports referrals hungry and requesting for sandwich to eat.    Past Psychiatric History: As outlined below  Risk to Self:  denies Risk to Others:  denies Prior Inpatient Therapy:  yes Prior Outpatient Therapy:  yes, at [facility]  Past Medical History:  Past Medical History:  Diagnosis Date   Cocaine abuse,  daily use ([place]) 03/23/2018   ETOH abuse 03/23/2018   Heroin abuse ([place]) 03/23/2018   History reviewed. No pertinent surgical history. Family History: History reviewed. No pertinent family history. Family Psychiatric  History: deferred Social History:  Social History   Substance and Sexual Activity  Alcohol Use Yes   Alcohol/week: 12.0 - 15.0 standard drinks of alcohol   Types: 12 - 15 Cans of beer per week   Comment: drinking daily for the past week     Social History   Substance and Sexual Activity  Drug Use Yes   Types: Cocaine    Social History   Socioeconomic History   Marital status: Single    Spouse name: Not on file   Number of children: Not on file   Years of education: Not on file   Highest education level: Not on file  Occupational History   Not on file  Tobacco Use   Smoking status: Former    Packs/day: 0.50    Years: 30.00    Total pack years: 15.00    Types: Cigarettes    Quit date: 01/12/2022    Years since quitting: 0.3   Smokeless tobacco: Never  Vaping Use   Vaping Use: Never used  Substance and Sexual Activity   Alcohol use: Yes    Alcohol/week: 12.0 - 15.0 standard drinks of alcohol    Types: 12 - 15 Cans of beer per week    Comment: drinking daily for the past week   Drug use: Yes    Types: Cocaine   Sexual activity: Yes    Birth control/protection: None  Other Topics Concern   Not on file  Social History Narrative   Not on file   Social Determinants of Health   Financial Resource Strain: Not on file  Food Insecurity: Unknown (03/31/2022)   Hunger Vital Sign    Worried About Running Out of Food in the Last Year: Patient refused    Ran Out of Food in the Last Year: Patient refused  Recent Concern: [place] Present (03/15/2022)   Hunger Vital Sign    Worried About Running Out of Food in the Last Year: Often true    Ran Out of Food in the Last Year: Often true  Transportation Needs: Unknown (03/31/2022)    PRAPARE - Hydrologist (Medical): Patient refused    Lack of Transportation (Non-Medical): Patient refused  Recent Concern: Transportation Needs - Unmet Transportation Needs (03/15/2022)   PRAPARE - Hydrologist (Medical): Yes    Lack of Transportation (Non-Medical): Yes  Physical Activity: Not on file  Stress: Not on file  Social Connections: Not on file   Additional Social History:    Allergies:   Allergies  Allergen Reactions   Latex Swelling, Rash and Other (See Comments)    Hands swell    Labs:  Results for orders placed or performed during the hospital encounter of 05/26/22 (from the past 48 hour(s))  Resp panel by RT-PCR (RSV, Flu A&B, Covid) Anterior Nasal  Swab     Status: None   Collection Time: 05/26/22 12:26 PM   Specimen: Anterior Nasal Swab  Result Value Ref Range   SARS Coronavirus 2 by RT PCR NEGATIVE NEGATIVE    Comment: (NOTE) SARS-CoV-2 target nucleic acids are NOT DETECTED.  The SARS-CoV-2 RNA is generally detectable in upper respiratory specimens during the acute phase of infection. The lowest concentration of SARS-CoV-2 viral copies this assay can detect is 138 copies/mL. A negative result does not preclude SARS-Cov-2 infection and should not be used as the sole basis for treatment or other patient management decisions. A negative result may occur with  improper specimen collection/handling, submission of specimen other than nasopharyngeal swab, presence of viral mutation(s) within the areas targeted by this assay, and inadequate number of viral copies(<138 copies/mL). A negative result must be combined with clinical observations, patient history, and epidemiological information. The expected result is Negative.  Fact Sheet for Patients:  EntrepreneurPulse.com.au  Fact Sheet for Healthcare Providers:  IncredibleEmployment.be  This test is no t yet approved  or cleared by the [place] FDA and  has been authorized for detection and/or diagnosis of SARS-CoV-2 by FDA under an Emergency Use Authorization (EUA). This EUA will remain  in effect (meaning this test can be used) for the duration of the COVID-19 declaration under Section 564(b)(1) of the Act, 21 U.S.C.section 360bbb-3(b)(1), unless the authorization is terminated  or revoked sooner.       Influenza A by PCR NEGATIVE NEGATIVE   Influenza B by PCR NEGATIVE NEGATIVE    Comment: (NOTE) The Xpert Xpress SARS-CoV-2/FLU/RSV plus assay is intended as an aid in the diagnosis of influenza from Nasopharyngeal swab specimens and should not be used as a sole basis for treatment. Nasal washings and aspirates are unacceptable for Xpert Xpress SARS-CoV-2/FLU/RSV testing.  Fact Sheet for Patients: EntrepreneurPulse.com.au  Fact Sheet for Healthcare Providers: IncredibleEmployment.be  This test is not yet approved or cleared by the [place] FDA and has been authorized for detection and/or diagnosis of SARS-CoV-2 by FDA under an Emergency Use Authorization (EUA). This EUA will remain in effect (meaning this test can be used) for the duration of the COVID-19 declaration under Section 564(b)(1) of the Act, 21 U.S.C. section 360bbb-3(b)(1), unless the authorization is terminated or revoked.     Resp Syncytial Virus by PCR NEGATIVE NEGATIVE    Comment: (NOTE) Fact Sheet for Patients: EntrepreneurPulse.com.au  Fact Sheet for Healthcare Providers: IncredibleEmployment.be  This test is not yet approved or cleared by the [place] FDA and has been authorized for detection and/or diagnosis of SARS-CoV-2 by FDA under an Emergency Use Authorization (EUA). This EUA will remain in effect (meaning this test can be used) for the duration of the COVID-19 declaration under Section 564(b)(1) of the Act, 21 U.S.C. section  360bbb-3(b)(1), unless the authorization is terminated or revoked.  Performed at [facility], [place] [address], [place], [place] [address]   Comprehensive metabolic panel     Status: Abnormal   Collection Time: 05/26/22 12:26 PM  Result Value Ref Range   Sodium 142 135 - 145 mmol/L   Potassium 3.8 3.5 - 5.1 mmol/L   Chloride 105 98 - 111 mmol/L   CO2 27 22 - 32 mmol/L   Glucose, Bld 127 (H) 70 - 99 mg/dL    Comment: Glucose reference range applies only to samples taken after fasting for at least 8 hours.   BUN 5 (L) 6 - 20 mg/dL   Creatinine, Ser  0.92 0.44 - 1.00 mg/dL   Calcium 8.3 (L) 8.9 - 10.3 mg/dL   Total Protein 7.2 6.5 - 8.1 g/dL   Albumin 3.0 (L) 3.5 - 5.0 g/dL   AST 33 15 - 41 U/L   ALT 32 0 - 44 U/L   Alkaline Phosphatase 50 38 - 126 U/L   Total Bilirubin 0.5 0.3 - 1.2 mg/dL   GFR, Estimated >60 >60 mL/min    Comment: (NOTE) Calculated using the CKD-EPI Creatinine Equation (2021)    Anion gap 10 5 - 15    Comment: Performed at [facility] [address], [place], [place] [address]  CBC with Differential     Status: Abnormal   Collection Time: 05/26/22 12:26 PM  Result Value Ref Range   WBC 11.9 (H) 4.0 - 10.5 K/uL   RBC 5.32 (H) 3.87 - 5.11 MIL/uL   Hemoglobin 16.0 (H) 12.0 - 15.0 g/dL   HCT 50.2 (H) 36.0 - 46.0 %   MCV 94.4 80.0 - 100.0 fL   MCH 30.1 26.0 - 34.0 pg   MCHC 31.9 30.0 - 36.0 g/dL   RDW 14.5 11.5 - 15.5 %   Platelets 306 150 - 400 K/uL   nRBC 0.0 0.0 - 0.2 %   Neutrophils Relative % 68 %   Neutro Abs 8.1 (H) 1.7 - 7.7 K/uL   Lymphocytes Relative 24 %   Lymphs Abs 2.9 0.7 - 4.0 K/uL   Monocytes Relative 7 %   Monocytes Absolute 0.8 0.1 - 1.0 K/uL   Eosinophils Relative 0 %   Eosinophils Absolute 0.0 0.0 - 0.5 K/uL   Basophils Relative 0 %   Basophils Absolute 0.0 0.0 - 0.1 K/uL   Immature Granulocytes 1 %   Abs Immature Granulocytes 0.06 0.00 - 0.07 K/uL    Comment: Performed at [facility], [place] [address],  [place], [place] [address]  Ethanol     Status: None   Collection Time: 05/26/22 12:26 PM  Result Value Ref Range   Alcohol, Ethyl (B) <10 <10 mg/dL    Comment: (NOTE) Lowest detectable limit for serum alcohol is 10 mg/dL.  For medical purposes only. Performed at [facility], [place] [address] [place], [place] [address]   Urine rapid drug screen (hosp performed)     Status: Abnormal   Collection Time: 05/26/22  4:26 PM  Result Value Ref Range   Opiates NONE DETECTED NONE DETECTED   Cocaine POSITIVE (A) NONE DETECTED   Benzodiazepines NONE DETECTED NONE DETECTED   Amphetamines POSITIVE (A) NONE DETECTED   Tetrahydrocannabinol NONE DETECTED NONE DETECTED   Barbiturates NONE DETECTED NONE DETECTED    Comment: (NOTE) DRUG SCREEN FOR MEDICAL PURPOSES ONLY.  IF CONFIRMATION IS NEEDED FOR ANY PURPOSE, NOTIFY LAB WITHIN 5 DAYS.  LOWEST DETECTABLE LIMITS FOR URINE DRUG SCREEN Drug Class                     Cutoff (ng/mL) Amphetamine and metabolites    1000 Barbiturate and metabolites    200 Benzodiazepine                 200 Opiates and metabolites        300 Cocaine and metabolites        300 THC                            50 Performed at [facility], [place] [address], [place], [place] [address]     Medications:  No current facility-administered medications for this encounter.   Current Outpatient Medications  Medication Sig Dispense Refill   acetaminophen (TYLENOL) 325 MG tablet Take 2 tablets (650 mg total) by mouth every 6 (six) hours as needed for mild pain (or Fever >/= 101). 20 tablet 0   albuterol (VENTOLIN HFA) 108 (90 Base) MCG/ACT inhaler Inhale 2 puffs into the lungs every 6 (six) hours as needed for wheezing or shortness of breath.     amLODipine (NORVASC) 5 MG tablet Take 1 tablet (5 mg total) by mouth daily. 30 tablet 0   ARIPiprazole (ABILIFY) 10 MG tablet Take 1 tablet (10 mg total) by mouth daily. 30 tablet 0   budesonide-formoterol (SYMBICORT) 80-4.5  MCG/ACT inhaler 2 puffs daily.     FLUoxetine (PROZAC) 40 MG capsule Take 1 capsule (40 mg total) by mouth daily. 30 capsule 0   furosemide (LASIX) 40 MG tablet Take 1 tablet (40 mg total) by mouth daily. 30 tablet 0   gabapentin (NEURONTIN) 400 MG capsule Take 2 capsules (800 mg total) by mouth 3 (three) times daily. 180 capsule 0   hydrocortisone (ANUSOL-HC) 2.5 % rectal cream Place rectally 3 (three) times daily. (Patient taking differently: Place 1 Application rectally daily as needed for hemorrhoids.) 30 g 0   hydrOXYzine (ATARAX) 50 MG tablet Take 1 tablet (50 mg total) by mouth 3 (three) times daily as needed for anxiety. 30 tablet 0   ibuprofen (ADVIL) 200 MG tablet Take 400 mg by mouth every 8 (eight) hours as needed for headache or mild pain.     lidocaine (LIDODERM) 5 % Place 1 patch onto the skin daily. Remove & Discard patch within 12 hours or as directed by MD (Patient taking differently: Place 2 patches onto the skin daily. Remove & Discard patch within 12 hours or as directed by MD) 30 patch 0   lisinopril (ZESTRIL) 10 MG tablet Take 1 tablet (10 mg total) by mouth in the morning. 30 tablet 0   nicotine polacrilex (NICORETTE) 2 MG gum Take 1 each (2 mg total) by mouth as needed for smoking cessation. 100 tablet 0   OZEMPIC, 0.25 OR 0.5 MG/DOSE, 2 MG/1.5ML SOPN Inject 0.5 mg into the skin every Monday.     pantoprazole (PROTONIX) 40 MG tablet Take 1 tablet (40 mg total) by mouth daily. 30 tablet 0   prazosin (MINIPRESS) 1 MG capsule Take 1 capsule (1 mg total) by mouth at bedtime. 30 capsule 0   senna (SENOKOT) 8.6 MG TABS tablet Take 1 tablet (8.6 mg total) by mouth daily. 30 tablet 0   traZODone (DESYREL) 100 MG tablet Take 1 tablet (100 mg total) by mouth at bedtime. 30 tablet 0    Musculoskeletal:pt moves all extremities and ambulates independently Strength & Muscle Tone: within normal limits Gait & Station: normal Patient leans: N/A  Psychiatric Specialty  Exam:  Presentation  General Appearance:  Appropriate for Environment  Eye Contact: Fair  Speech: Clear and Coherent  Speech Volume: Normal  Handedness: Right   Mood and Affect  Mood: Anxious  Affect: Blunt; Congruent   Thought Process  Thought Processes: Coherent  Descriptions of Associations:Intact  Orientation:Full (Time, Place and Person)  Thought Content:Logical (has improved since admission)  History of Schizophrenia/Schizoaffective disorder:No  Duration of Psychotic Symptoms:N/A  Hallucinations:Hallucinations: None  Ideas of Reference:None  Suicidal Thoughts:Suicidal Thoughts: Yes, Passive SI Passive Intent and/or Plan: Without Intent; Without  Plan  Homicidal Thoughts:Homicidal Thoughts: No   Sensorium  Memory: Immediate Good; Recent Good; Remote Good  Judgment: -- (impulsive at baseline AEB cocaine abuse)  Insight: Fair   Community education officer  Concentration: Good  Attention Span: Good  Recall: Good  Fund of Knowledge: Fair  Language: Good   Psychomotor Activity  Psychomotor Activity: Psychomotor Activity: Normal   Assets  Assets: Communication Skills   Sleep  Sleep: Sleep: Good Number of Hours of Sleep: 7    Physical Exam: Physical Exam Vitals and nursing note reviewed.  Constitutional:      Appearance: She is obese.  Musculoskeletal:     Cervical back: Normal range of motion.  Neurological:     Mental Status: She is alert and oriented to person, place, and time. Mental status is at baseline.  Psychiatric:        Attention and Perception: Attention and perception normal.        Mood and Affect: Affect normal. Mood is anxious.        Speech: Speech normal.        Behavior: Behavior normal. Behavior is cooperative.        Thought Content: Thought content is not paranoid or delusional. Thought content includes suicidal (passive, no plan or intent for self harm) ideation. Thought content does not include  homicidal ideation. Thought content does not include homicidal or suicidal plan.        Cognition and Memory: Cognition and memory normal.        Judgment: Judgment is impulsive.    Review of Systems  Constitutional: Negative.   HENT: Negative.    Eyes: Negative.   Skin: Negative.   Neurological: Negative.   Endo/Heme/Allergies: Negative.   Psychiatric/Behavioral:  Positive for depression and substance abuse. The patient is nervous/anxious.    Blood pressure 132/78, pulse 88, temperature 99.5 F (37.5 C), temperature source Oral, resp. rate 20, height '5\' 4"'$  (1.626 m), last menstrual period 07/01/2021, SpO2 (!) 88 %. Body mass index is 56.64 kg/m.  Treatment Plan Summary: Patient care disposition discussed with Dr. Dwyane Dee, psychiatrist.   Patient presents with suicidal ideation, and plan to overdose on pills.  Patient has a history of bipolar disorder, polysubstance abuse and non-adherence to psychiatric medications. Her presentation is complicated by cocaine abuse. UDS was + for cocaine. Since admission patient was restarted on  medications and monitored for safety.  Her most pressing concern today is homelessness for which I have asked SW to provide resources.    On assessment today, she is alert and oriented x4; clear and coherent and appropriately able to engage in conversation and make her needs known.  She still has suicidal ideations but reports these are chronic and baseline for her but she contracts for safety and denies plan or intent for self harm.    As per above, patient does not meet criteria for involuntary psychiatric admissions.  We reviewed the importance of substance abuse abstinence; potential negative impact substance abuse can have on relationships and level of functioning.  Also reviewed the importance of medication compliance.   Disposition: No evidence of imminent risk to self or others at present.   Patient does not meet criteria for psychiatric inpatient  admission. Supportive therapy provided about ongoing stressors. Discussed crisis plan, support from social network, calling 911, coming to the Emergency Department, and calling Suicide Hotline.   This service was provided via telemedicine using a 2-way, interactive audio and video technology.  Names of all persons participating in  this telemedicine service and their role in this encounter. Name: [name] Role: Patient  Name: Merlyn Lot Role: Doctor Phillips  Name: Hampton Abbot Role: Psychiatrist    Mallie Darting, NP 05/27/2022 5:31 PM

## 2022-05-27 NOTE — ED Provider Notes (Signed)
Emergency Medicine Observation Re-evaluation Note  Sarah Cervantes is a 41 y.o. female, seen on rounds today.  Pt initially presented to the ED for complaints of body aches and non prod cough. Pt also noted recent stress related to housing instability and sud.  Feels improved this AM. Normal appetite. No new physical c/o.   Physical Exam  BP 132/78 (BP Location: Right Arm)   Pulse 88   Temp 99.5 F (37.5 C) (Oral)   Resp 20   Ht 1.626 m ('5\' 4"'$ )   SpO2 (!) 88%   BMI 56.64 kg/m  Physical Exam General: alert, content, no distress.  Cardiac: regular rate. Lungs: breathing comfortably. Psych: normal mood and affect. Pt notes situational feelings of depression and stress related to housing instability. Pt does not appear to be responding to internal stimuli - no delusions or hallucinations noted. No SI/HI.  ED Course / MDM    I have reviewed the labs performed to date as well as medications administered while in observation.  Recent changes in the last 24 hours include ED obs, reassessment.   Plan  Current plan is for ED obs, reassessment.   Currently pt appears stable for d/c.  Pt may likely benefit from outpatient [facility] and [facility] follow up and treatment - will provide resource guide.  Will also provide additional community social services resources.       Lajean Saver, MD 05/27/22 1146

## 2022-05-27 NOTE — Discharge Instructions (Signed)
Follow-up with your family doctor in a week for your cough

## 2022-05-27 NOTE — ED Notes (Signed)
This RN reviewed discharge instructions with patient. She verbalized understanding and denied any further questions. Upon discharge pt stated "she wants to see the police to turn herself in" because "she has no where to go". Pt was given all of her belongings prior to leaving, cap voucher, snack/drink and ambulated with stable gait to exit. Pt was well appearing upon discharge.

## 2022-05-27 NOTE — ED Notes (Signed)
Pt requesting "an exterminator for all these bugs flying around and biting her".

## 2022-05-27 NOTE — ED Provider Notes (Incomplete)
[place] DEPT Provider Note   CSN: [id number] Arrival date & time: 05/27/22  1556     History {Add pertinent medical, surgical, social history, OB history to HPI:1} Chief Complaint  Patient presents with   Cough   Suicidal    Sarah Cervantes is a 41 y.o. female.  Patient has a history of substance abuse and COPD and depression.  She was seen yesterday and today in the emergency department for her depression and was cleared to be discharged home.  Patient complains of a cough now   Cough      Home Medications Prior to Admission medications   Medication Sig Start Date End Date Taking? Authorizing Provider  acetaminophen (TYLENOL) 325 MG tablet Take 2 tablets (650 mg total) by mouth every 6 (six) hours as needed for mild pain (or Fever >/= 101). 03/16/22   Sheikh, Georgina Quint Latif, DO  albuterol (VENTOLIN HFA) 108 (90 Base) MCG/ACT inhaler Inhale 2 puffs into the lungs every 6 (six) hours as needed for wheezing or shortness of breath.    [provider]  amLODipine (NORVASC) 5 MG tablet Take 1 tablet (5 mg total) by mouth daily. 04/06/22 05/06/22  Massengill, Ovid Curd, MD  ARIPiprazole (ABILIFY) 10 MG tablet Take 1 tablet (10 mg total) by mouth daily. 04/07/22 05/07/22  Massengill, Ovid Curd, MD  budesonide-formoterol (SYMBICORT) 80-4.5 MCG/ACT inhaler 2 puffs daily. 02/28/22   [provider]  FLUoxetine (PROZAC) 40 MG capsule Take 1 capsule (40 mg total) by mouth daily. 04/07/22 05/07/22  Massengill, Ovid Curd, MD  furosemide (LASIX) 40 MG tablet Take 1 tablet (40 mg total) by mouth daily. 04/06/22 05/06/22  Massengill, Ovid Curd, MD  gabapentin (NEURONTIN) 400 MG capsule Take 2 capsules (800 mg total) by mouth 3 (three) times daily. 04/06/22 05/06/22  Massengill, Ovid Curd, MD  hydrocortisone (ANUSOL-HC) 2.5 % rectal cream Place rectally 3 (three) times daily. Patient taking differently: Place 1 Application rectally daily as needed for hemorrhoids.  03/26/18   Charlynne Cousins, MD  hydrOXYzine (ATARAX) 50 MG tablet Take 1 tablet (50 mg total) by mouth 3 (three) times daily as needed for anxiety. 04/06/22   Massengill, Ovid Curd, MD  ibuprofen (ADVIL) 200 MG tablet Take 400 mg by mouth every 8 (eight) hours as needed for headache or mild pain.    [provider]  lidocaine (LIDODERM) 5 % Place 1 patch onto the skin daily. Remove & Discard patch within 12 hours or as directed by MD Patient taking differently: Place 2 patches onto the skin daily. Remove & Discard patch within 12 hours or as directed by MD 03/16/22   Raiford Noble Latif, DO  lisinopril (ZESTRIL) 10 MG tablet Take 1 tablet (10 mg total) by mouth in the morning. 04/06/22 05/06/22  Massengill, Ovid Curd, MD  nicotine polacrilex (NICORETTE) 2 MG gum Take 1 each (2 mg total) by mouth as needed for smoking cessation. 04/06/22   Massengill, Ovid Curd, MD  OZEMPIC, 0.25 OR 0.5 MG/DOSE, 2 MG/1.5ML SOPN Inject 0.5 mg into the skin every Monday.    [provider]  pantoprazole (PROTONIX) 40 MG tablet Take 1 tablet (40 mg total) by mouth daily. 04/07/22 05/07/22  Massengill, Ovid Curd, MD  prazosin (MINIPRESS) 1 MG capsule Take 1 capsule (1 mg total) by mouth at bedtime. 04/06/22 05/06/22  Massengill, Ovid Curd, MD  senna (SENOKOT) 8.6 MG TABS tablet Take 1 tablet (8.6 mg total) by mouth daily. 04/06/22   Massengill, Ovid Curd, MD  traZODone (DESYREL) 100 MG tablet Take 1 tablet (100 mg  total) by mouth at bedtime. 04/06/22 05/06/22  Janine Limbo, MD      Allergies    Latex    Review of Systems   Review of Systems  Respiratory:  Positive for cough.     Physical Exam Updated Vital Signs BP (!) 150/96   Pulse 91   Temp 98.3 F (36.8 C) (Oral)   Resp 18   LMP 07/01/2021   SpO2 97%  Physical Exam  ED Results / Procedures / Treatments   Labs (all labs ordered are listed, but only abnormal results are displayed) Labs Reviewed  CBC WITH DIFFERENTIAL/PLATELET - Abnormal; Notable  for the following components:      Result Value   WBC 17.4 (*)    RBC 5.21 (*)    Hemoglobin 15.6 (*)    HCT 49.8 (*)    Neutro Abs 12.2 (*)    Monocytes Absolute 1.5 (*)    Abs Immature Granulocytes 0.15 (*)    All other components within normal limits  BASIC METABOLIC PANEL - Abnormal; Notable for the following components:   Glucose, Bld 116 (*)    Calcium 8.8 (*)    All other components within normal limits  BRAIN NATRIURETIC PEPTIDE - Abnormal; Notable for the following components:   B Natriuretic Peptide 190.3 (*)    All other components within normal limits  TROPONIN I (HIGH SENSITIVITY)  TROPONIN I (HIGH SENSITIVITY)    EKG None  Radiology DG Chest 2 View  Result Date: 05/27/2022 CLINICAL DATA:  Shortness of breath EXAM: CHEST - 2 VIEW COMPARISON:  05/26/2022 FINDINGS: Stable heart size. Low lung volumes. Mild bibasilar atelectasis. No pleural effusion or pneumothorax. IMPRESSION: Low lung volumes with mild bibasilar atelectasis. Electronically Signed   By: Davina Poke D.O.   On: 05/27/2022 16:50   DG Chest 2 View  Result Date: 05/26/2022 CLINICAL DATA:  Cough EXAM: CHEST - 2 VIEW COMPARISON:  04/03/2022 FINDINGS: Transverse diameter of heart is increased. There are no signs of pulmonary edema. There is no focal pulmonary consolidation. Small linear densities in right lower lung field may suggest scarring or subsegmental atelectasis. There is no significant pleural effusion or pneumothorax. IMPRESSION: Cardiomegaly. There are no signs of pulmonary edema or focal pulmonary consolidation. Electronically Signed   By: Elmer Picker M.D.   On: 05/26/2022 12:48    Procedures Procedures  {Document cardiac monitor, telemetry assessment procedure when appropriate:1}  Medications Ordered in ED Medications  predniSONE (DELTASONE) tablet 60 mg (has no administration in time range)  albuterol (VENTOLIN HFA) 108 (90 Base) MCG/ACT inhaler 2 puff (has no administration in  time range)  ipratropium-albuterol (DUONEB) 0.5-2.5 (3) MG/3ML nebulizer solution 3 mL (3 mLs Nebulization Given 05/27/22 2220)  albuterol (PROVENTIL) (2.5 MG/3ML) 0.083% nebulizer solution 2.5 mg (2.5 mg Nebulization Given 05/27/22 2220)    ED Course/ Medical Decision Making/ A&P                           Medical Decision Making Risk Prescription drug management.   Patient with bronchospasm that improved with neb treatments, she will be discharged home with albuterol and prednisone and follow-up as an outpatient  {Document critical care time when appropriate:1} {Document review of labs and clinical decision tools ie heart score, Chads2Vasc2 etc:1}  {Document your independent review of radiology images, and any outside records:1} {Document your discussion with family members, caretakers, and with consultants:1} {Document social determinants of health affecting pt's care:1} {Document your decision  making why or why not admission, treatments were needed:1} Final Clinical Impression(s) / ED Diagnoses Final diagnoses:  None    Rx / DC Orders ED Discharge Orders     None

## 2022-05-28 ENCOUNTER — Other Ambulatory Visit (HOSPITAL_COMMUNITY): Payer: Self-pay

## 2022-05-30 ENCOUNTER — Other Ambulatory Visit (HOSPITAL_COMMUNITY): Payer: Self-pay

## 2022-06-08 ENCOUNTER — Other Ambulatory Visit (HOSPITAL_COMMUNITY): Payer: Self-pay

## 2022-07-16 ENCOUNTER — Emergency Department (HOSPITAL_COMMUNITY): Payer: 59

## 2022-07-16 ENCOUNTER — Emergency Department (HOSPITAL_BASED_OUTPATIENT_CLINIC_OR_DEPARTMENT_OTHER): Admit: 2022-07-16 | Discharge: 2022-07-16 | Disposition: A | Discharge status: Home / Self Care | Payer: 59

## 2022-07-16 ENCOUNTER — Other Ambulatory Visit: Payer: Self-pay

## 2022-07-16 ENCOUNTER — Observation Stay (HOSPITAL_COMMUNITY)
Admission: EM | Admit: 2022-07-16 | Discharge: 2022-07-26 | Disposition: A | Discharge status: Home Health | Payer: 59 | Attending: Internal Medicine | Admitting: Internal Medicine

## 2022-07-16 DIAGNOSIS — G4730 Sleep apnea, unspecified: Secondary | ICD-10-CM | POA: Insufficient documentation

## 2022-07-16 DIAGNOSIS — F1120 Opioid dependence, uncomplicated: Secondary | ICD-10-CM | POA: Diagnosis present

## 2022-07-16 DIAGNOSIS — F1414 Cocaine abuse with cocaine-induced mood disorder: Secondary | ICD-10-CM | POA: Diagnosis present

## 2022-07-16 DIAGNOSIS — F39 Unspecified mood [affective] disorder: Secondary | ICD-10-CM | POA: Insufficient documentation

## 2022-07-16 DIAGNOSIS — R531 Weakness: Secondary | ICD-10-CM | POA: Diagnosis not present

## 2022-07-16 DIAGNOSIS — F319 Bipolar disorder, unspecified: Secondary | ICD-10-CM | POA: Diagnosis present

## 2022-07-16 DIAGNOSIS — I10 Essential (primary) hypertension: Secondary | ICD-10-CM | POA: Insufficient documentation

## 2022-07-16 DIAGNOSIS — Z1152 Encounter for screening for COVID-19: Secondary | ICD-10-CM | POA: Insufficient documentation

## 2022-07-16 DIAGNOSIS — J441 Chronic obstructive pulmonary disease with (acute) exacerbation: Secondary | ICD-10-CM | POA: Diagnosis not present

## 2022-07-16 DIAGNOSIS — M79605 Pain in left leg: Secondary | ICD-10-CM

## 2022-07-16 DIAGNOSIS — F4310 Post-traumatic stress disorder, unspecified: Secondary | ICD-10-CM | POA: Diagnosis not present

## 2022-07-16 DIAGNOSIS — Z91040 Latex allergy status: Secondary | ICD-10-CM | POA: Insufficient documentation

## 2022-07-16 DIAGNOSIS — J9611 Chronic respiratory failure with hypoxia: Secondary | ICD-10-CM | POA: Diagnosis not present

## 2022-07-16 DIAGNOSIS — Z6841 Body Mass Index (BMI) 40.0 and over, adult: Secondary | ICD-10-CM | POA: Insufficient documentation

## 2022-07-16 DIAGNOSIS — F1920 Other psychoactive substance dependence, uncomplicated: Secondary | ICD-10-CM | POA: Diagnosis present

## 2022-07-16 DIAGNOSIS — F1910 Other psychoactive substance abuse, uncomplicated: Secondary | ICD-10-CM | POA: Diagnosis present

## 2022-07-16 DIAGNOSIS — M7989 Other specified soft tissue disorders: Secondary | ICD-10-CM

## 2022-07-16 DIAGNOSIS — Z87891 Personal history of nicotine dependence: Secondary | ICD-10-CM | POA: Diagnosis not present

## 2022-07-16 DIAGNOSIS — M25562 Pain in left knee: Secondary | ICD-10-CM | POA: Diagnosis present

## 2022-07-16 DIAGNOSIS — J449 Chronic obstructive pulmonary disease, unspecified: Secondary | ICD-10-CM

## 2022-07-16 DIAGNOSIS — R45851 Suicidal ideations: Secondary | ICD-10-CM | POA: Insufficient documentation

## 2022-07-16 DIAGNOSIS — Z79899 Other long term (current) drug therapy: Secondary | ICD-10-CM | POA: Diagnosis not present

## 2022-07-16 DIAGNOSIS — F1994 Other psychoactive substance use, unspecified with psychoactive substance-induced mood disorder: Secondary | ICD-10-CM | POA: Diagnosis present

## 2022-07-16 LAB — RESP PANEL BY RT-PCR (RSV, FLU A&B, COVID)  RVPGX2
Influenza A by PCR: NEGATIVE
Influenza B by PCR: NEGATIVE
Resp Syncytial Virus by PCR: NEGATIVE
SARS Coronavirus 2 by RT PCR: NEGATIVE

## 2022-07-16 LAB — CBC WITH DIFFERENTIAL/PLATELET
Abs Immature Granulocytes: 0.03 10*3/uL (ref 0.00–0.07)
Basophils Absolute: 0.1 10*3/uL (ref 0.0–0.1)
Basophils Relative: 1 %
Eosinophils Absolute: 0.3 10*3/uL (ref 0.0–0.5)
Eosinophils Relative: 3 %
HCT: 54.1 % — ABNORMAL HIGH (ref 36.0–46.0)
Hemoglobin: 16.8 g/dL — ABNORMAL HIGH (ref 12.0–15.0)
Immature Granulocytes: 0 %
Lymphocytes Relative: 26 %
Lymphs Abs: 2.1 10*3/uL (ref 0.7–4.0)
MCH: 29.1 pg (ref 26.0–34.0)
MCHC: 31.1 g/dL (ref 30.0–36.0)
MCV: 93.8 fL (ref 80.0–100.0)
Monocytes Absolute: 0.6 10*3/uL (ref 0.1–1.0)
Monocytes Relative: 8 %
Neutro Abs: 5.1 10*3/uL (ref 1.7–7.7)
Neutrophils Relative %: 62 %
Platelets: 392 10*3/uL (ref 150–400)
RBC: 5.77 MIL/uL — ABNORMAL HIGH (ref 3.87–5.11)
RDW: 18.3 % — ABNORMAL HIGH (ref 11.5–15.5)
WBC: 8.2 10*3/uL (ref 4.0–10.5)
nRBC: 0 % (ref 0.0–0.2)

## 2022-07-16 LAB — BLOOD GAS, VENOUS
Acid-Base Excess: 5.9 mmol/L — ABNORMAL HIGH (ref 0.0–2.0)
Bicarbonate: 34.5 mmol/L — ABNORMAL HIGH (ref 20.0–28.0)
O2 Saturation: 33.5 %
Patient temperature: 37
pCO2, Ven: 64 mmHg — ABNORMAL HIGH (ref 44–60)
pH, Ven: 7.34 (ref 7.25–7.43)
pO2, Ven: <31 mmHg — CL (ref 32–45)

## 2022-07-16 LAB — LACTIC ACID, PLASMA
Lactic Acid, Venous: 1.2 mmol/L (ref 0.5–1.9)
Lactic Acid, Venous: 1.1 mmol/L (ref 0.5–1.9)

## 2022-07-16 LAB — RAPID URINE DRUG SCREEN, HOSP PERFORMED
Amphetamines: NOT DETECTED
Barbiturates: NOT DETECTED
Benzodiazepines: NOT DETECTED
Cocaine: POSITIVE — AB
Opiates: NOT DETECTED
Tetrahydrocannabinol: NOT DETECTED

## 2022-07-16 LAB — COMPREHENSIVE METABOLIC PANEL
ALT: 14 U/L (ref 0–44)
AST: 17 U/L (ref 15–41)
Albumin: 3.6 g/dL (ref 3.5–5.0)
Alkaline Phosphatase: 64 U/L (ref 38–126)
Anion gap: 9 (ref 5–15)
BUN: 9 mg/dL (ref 6–20)
CO2: 27 mmol/L (ref 22–32)
Calcium: 8.8 mg/dL — ABNORMAL LOW (ref 8.9–10.3)
Chloride: 103 mmol/L (ref 98–111)
Creatinine, Ser: 0.87 mg/dL (ref 0.44–1.00)
GFR, Estimated: >60 mL/min (ref >60)
Glucose, Bld: 103 mg/dL — ABNORMAL HIGH (ref 70–99)
Potassium: 4.3 mmol/L (ref 3.5–5.1)
Sodium: 139 mmol/L (ref 135–145)
Total Bilirubin: 0.4 mg/dL (ref 0.3–1.2)
Total Protein: 7.7 g/dL (ref 6.5–8.1)

## 2022-07-16 LAB — I-STAT BETA HCG BLOOD, ED (MC, WL, AP ONLY): I-stat hCG, quantitative: <5 m[IU]/mL (ref <5)

## 2022-07-16 LAB — ETHANOL: Alcohol, Ethyl (B): <10 mg/dL (ref <10)

## 2022-07-16 MED ORDER — TRAZODONE HCL 100 MG PO TABS
100.0000 mg | ORAL_TABLET | Freq: Every day | ORAL | Status: DC
  Administered 2022-07-16 – 2022-07-20 (×4): 100 mg via ORAL
  Filled 2022-07-16 (×5): qty 1

## 2022-07-16 MED ORDER — FLUOXETINE HCL 20 MG PO CAPS
40.0000 mg | ORAL_CAPSULE | Freq: Every day | ORAL | Status: DC
  Administered 2022-07-16 – 2022-07-26 (×11): 40 mg via ORAL
  Filled 2022-07-16 (×11): qty 2

## 2022-07-16 MED ORDER — ACETAMINOPHEN 325 MG PO TABS: 650.0000 mg | ORAL_TABLET | Freq: Four times a day (QID) | ORAL | Status: DC | PRN

## 2022-07-16 MED ORDER — AMLODIPINE BESYLATE 5 MG PO TABS
5.0000 mg | ORAL_TABLET | Freq: Every day | ORAL | Status: DC
  Administered 2022-07-16 – 2022-07-26 (×11): 5 mg via ORAL
  Filled 2022-07-16 (×11): qty 1

## 2022-07-16 MED ORDER — LISINOPRIL 10 MG PO TABS
10.0000 mg | ORAL_TABLET | Freq: Every morning | ORAL | Status: DC
  Administered 2022-07-17 – 2022-07-26 (×9): 10 mg via ORAL
  Filled 2022-07-16 (×10): qty 1

## 2022-07-16 MED ORDER — SENNA 8.6 MG PO TABS
1.0000 | ORAL_TABLET | Freq: Every day | ORAL | Status: DC
  Administered 2022-07-16 – 2022-07-26 (×11): 8.6 mg via ORAL
  Filled 2022-07-16 (×11): qty 1

## 2022-07-16 MED ORDER — IBUPROFEN 200 MG PO TABS
400.0000 mg | ORAL_TABLET | Freq: Three times a day (TID) | ORAL | Status: DC | PRN
  Administered 2022-07-17 – 2022-07-26 (×11): 400 mg via ORAL
  Filled 2022-07-16 (×11): qty 2

## 2022-07-16 MED ORDER — HYDROXYZINE HCL 25 MG PO TABS
50.0000 mg | ORAL_TABLET | Freq: Three times a day (TID) | ORAL | Status: DC | PRN
  Administered 2022-07-19 – 2022-07-23 (×4): 50 mg via ORAL
  Filled 2022-07-16 (×5): qty 2

## 2022-07-16 MED ORDER — GABAPENTIN 400 MG PO CAPS
800.0000 mg | ORAL_CAPSULE | Freq: Three times a day (TID) | ORAL | Status: DC
  Administered 2022-07-16 – 2022-07-26 (×28): 800 mg via ORAL
  Filled 2022-07-16 (×28): qty 2

## 2022-07-16 MED ORDER — ARIPIPRAZOLE 5 MG PO TABS
10.0000 mg | ORAL_TABLET | Freq: Every day | ORAL | Status: DC
  Administered 2022-07-16 – 2022-07-26 (×11): 10 mg via ORAL
  Filled 2022-07-16 (×4): qty 2
  Filled 2022-07-16: qty 1
  Filled 2022-07-16 (×2): qty 2
  Filled 2022-07-16: qty 1
  Filled 2022-07-16 (×3): qty 2

## 2022-07-16 NOTE — ED Triage Notes (Signed)
Pt BIBA from hotel. Pt called d/t fall 2x days, and SOB. Pt is supposed to be on O2 at home, but has been non-compliant w/that and all meds. Pt at 88% on RA when EMS arrived, placed on 2L Justice went up to 96%  Pt c/o L knee, hip, and back pain. Bilateral lower legs red and painful to touch.  Aox4  BP: 150/90 HR: 90 SPO2: 96 2LNC CBG: 150
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
## 2022-07-16 NOTE — ED Provider Notes (Signed)
[facility] Provider Note   CSN: [id number] Arrival date & time: 07/16/22  1249     History  Chief Complaint  Patient presents with   Fall   Leg Pain    Sarah Cervantes is a 42 y.o. female.  Patient presents to the emergency department today for evaluation of lower extremity pain and swelling.  Patient has a history of COPD, currently living at a hotel.  Per EMS report, noncompliant with home oxygen and medications.  She has been prescribed Lasix but no evidence of congestive heart failure on previous echo.  She has a history of bipolar and anxiety.  Patient poorly compliant with history, but does state that she has had increasing leg pain and swelling over the past 3 to 4 days.  Per EMS report, she reported a fall 2 days ago and complained of left knee pain, hip pain and back pain.       Home Medications Prior to Admission medications   Medication Sig Start Date End Date Taking? Authorizing Provider  acetaminophen (TYLENOL) 325 MG tablet Take 2 tablets (650 mg total) by mouth every 6 (six) hours as needed for mild pain (or Fever >/= 101). 03/16/22   Sheikh, Georgina Quint Latif, DO  albuterol (VENTOLIN HFA) 108 (90 Base) MCG/ACT inhaler Inhale 2 puffs into the lungs every 6 (six) hours as needed for wheezing or shortness of breath.    [provider]  amLODipine (NORVASC) 5 MG tablet Take 1 tablet (5 mg total) by mouth daily. 04/06/22 05/06/22  Massengill, Ovid Curd, MD  ARIPiprazole (ABILIFY) 10 MG tablet Take 1 tablet (10 mg total) by mouth daily. 04/07/22 05/07/22  Massengill, Ovid Curd, MD  budesonide-formoterol (SYMBICORT) 80-4.5 MCG/ACT inhaler 2 puffs daily. 02/28/22   [provider]  FLUoxetine (PROZAC) 40 MG capsule Take 1 capsule (40 mg total) by mouth daily. 04/07/22 05/07/22  Massengill, Ovid Curd, MD  furosemide (LASIX) 40 MG tablet Take 1 tablet (40 mg total) by mouth daily. 04/06/22 05/06/22  Massengill, Ovid Curd, MD   gabapentin (NEURONTIN) 400 MG capsule Take 2 capsules (800 mg total) by mouth 3 (three) times daily. 04/06/22 05/06/22  Massengill, Ovid Curd, MD  hydrocortisone (ANUSOL-HC) 2.5 % rectal cream Place rectally 3 (three) times daily. Patient taking differently: Place 1 Application rectally daily as needed for hemorrhoids. 03/26/18   Charlynne Cousins, MD  hydrOXYzine (ATARAX) 50 MG tablet Take 1 tablet (50 mg total) by mouth 3 (three) times daily as needed for anxiety. 04/06/22   Massengill, Ovid Curd, MD  ibuprofen (ADVIL) 200 MG tablet Take 400 mg by mouth every 8 (eight) hours as needed for headache or mild pain.    [provider]  lidocaine (LIDODERM) 5 % Place 1 patch onto the skin daily. Remove & Discard patch within 12 hours or as directed by MD Patient taking differently: Place 2 patches onto the skin daily. Remove & Discard patch within 12 hours or as directed by MD 03/16/22   Raiford Noble Latif, DO  lisinopril (ZESTRIL) 10 MG tablet Take 1 tablet (10 mg total) by mouth in the morning. 04/06/22 05/06/22  Massengill, Ovid Curd, MD  nicotine polacrilex (NICORETTE) 2 MG gum Take 1 each (2 mg total) by mouth as needed for smoking cessation. 04/06/22   Massengill, Ovid Curd, MD  OZEMPIC, 0.25 OR 0.5 MG/DOSE, 2 MG/1.5ML SOPN Inject 0.5 mg into the skin every Monday.    [provider]  pantoprazole (PROTONIX) 40 MG tablet Take 1 tablet (40 mg total) by  mouth daily. 04/07/22 05/07/22  Massengill, Ovid Curd, MD  prazosin (MINIPRESS) 1 MG capsule Take 1 capsule (1 mg total) by mouth at bedtime. 04/06/22 05/06/22  Massengill, Ovid Curd, MD  predniSONE (DELTASONE) 10 MG tablet Take 2 tablets (20 mg total) by mouth daily. 05/27/22   Milton Ferguson, MD  senna (SENOKOT) 8.6 MG TABS tablet Take 1 tablet (8.6 mg total) by mouth daily. 04/06/22   Massengill, Ovid Curd, MD  traZODone (DESYREL) 100 MG tablet Take 1 tablet (100 mg total) by mouth at bedtime. 04/06/22 05/06/22  Massengill, Ovid Curd, MD      Allergies     Latex    Review of Systems   Review of Systems  Physical Exam Updated Vital Signs BP (!) 165/102 (BP Location: Right Arm)   Pulse 85   Temp 97.9 F (36.6 C) (Oral)   Resp 16   Ht 5' 4"$  (1.626 m)   Wt 124.7 kg   LMP 07/01/2021   SpO2 95%   BMI 47.20 kg/m   Physical Exam Vitals and nursing note reviewed.  Constitutional:      General: She is not in acute distress.    Appearance: She is well-developed.  HENT:     Head: Normocephalic and atraumatic.     Right Ear: External ear normal.     Left Ear: External ear normal.     Nose: Nose normal.  Eyes:     Conjunctiva/sclera: Conjunctivae normal.  Cardiovascular:     Rate and Rhythm: Normal rate and regular rhythm.     Heart sounds: No murmur heard. Pulmonary:     Effort: No respiratory distress.     Breath sounds: No wheezing, rhonchi or rales.  Abdominal:     Palpations: Abdomen is soft.     Tenderness: There is no abdominal tenderness. There is no guarding or rebound.  Musculoskeletal:     Cervical back: Normal range of motion and neck supple.     Left knee: Bony tenderness present. Decreased range of motion. Tenderness present.     Right lower leg: Edema present.     Left lower leg: Edema present.     Comments: Bilateral lower extremity edema with erythema and warmth  Skin:    General: Skin is warm and dry.     Findings: No rash.  Neurological:     General: No focal deficit present.     Mental Status: She is alert.     Motor: No weakness.     Comments: Patient on 5 L nasal cannula, responds to questioning, but needs repeated stimulation because she is somnolent.  Psychiatric:        Mood and Affect: Mood normal.            ED Results / Procedures / Treatments   Labs (all labs ordered are listed, but only abnormal results are displayed) Labs Reviewed  COMPREHENSIVE METABOLIC PANEL - Abnormal; Notable for the following components:      Result Value   Glucose, Bld 103 (*)    Calcium 8.8 (*)    All  other components within normal limits  CBC WITH DIFFERENTIAL/PLATELET - Abnormal; Notable for the following components:   RBC 5.77 (*)    Hemoglobin 16.8 (*)    HCT 54.1 (*)    RDW 18.3 (*)    All other components within normal limits  BLOOD GAS, VENOUS - Abnormal; Notable for the following components:   pCO2, Ven 64 (*)    pO2, Ven <31 (*)    Bicarbonate  34.5 (*)    Acid-Base Excess 5.9 (*)    All other components within normal limits  RAPID URINE DRUG SCREEN, HOSP PERFORMED - Abnormal; Notable for the following components:   Cocaine POSITIVE (*)    All other components within normal limits  CULTURE, BLOOD (ROUTINE X 2)  CULTURE, BLOOD (ROUTINE X 2)  LACTIC ACID, PLASMA  LACTIC ACID, PLASMA  I-STAT BETA HCG BLOOD, ED ([facility], [facility], [facility] ONLY)    EKG None  Radiology VAS [place] LOWER EXTREMITY VENOUS (DVT) (ONLY [facility] & [facility])  Result Date: 07/16/2022  Lower Venous DVT Study Patient Name:  Sarah Cervantes  Date of Exam:   07/16/2022 Medical Rec #: [id number]          Accession #:    [id number] Date of Birth: April 29, 1981          Patient Gender: F Patient Age:   37 years Exam Location:  [facility] Procedure:      VAS [place] LOWER EXTREMITY VENOUS (DVT) Referring Phys: Vonna Kotyk Shruti Arrey --------------------------------------------------------------------------------  Indications: Swelling.  Risk Factors: None identified. Limitations: Body habitus, poor ultrasound/tissue interface and poor patient cooperation, patient positioning, patient pain tolerance. Comparison Study: No prior studies. Performing Technologist: Oliver Hum RVT  Examination Guidelines: A complete evaluation includes B-mode imaging, spectral Doppler, color Doppler, and power Doppler as needed of all accessible portions of each vessel. Bilateral testing is considered an integral part of a complete examination. Limited examinations for reoccurring indications may be performed as noted. The reflux portion of the exam is performed with the  patient in reverse Trendelenburg.  +---------+---------------+---------+-----------+----------+-------------------+ RIGHT    CompressibilityPhasicitySpontaneityPropertiesThrombus Aging      +---------+---------------+---------+-----------+----------+-------------------+ CFV      Full           Yes      Yes                                      +---------+---------------+---------+-----------+----------+-------------------+ SFJ      Full                                                             +---------+---------------+---------+-----------+----------+-------------------+ FV Prox  Full                                                             +---------+---------------+---------+-----------+----------+-------------------+ FV Mid   Full                                                             +---------+---------------+---------+-----------+----------+-------------------+ FV Distal               Yes      Yes                                      +---------+---------------+---------+-----------+----------+-------------------+  PFV      Full                                                             +---------+---------------+---------+-----------+----------+-------------------+ POP      Full           Yes      Yes                                      +---------+---------------+---------+-----------+----------+-------------------+ PTV      Full                                                             +---------+---------------+---------+-----------+----------+-------------------+ PERO                                                  Not well visualized +---------+---------------+---------+-----------+----------+-------------------+   +---------+---------------+---------+-----------+----------+-------------------+ LEFT     CompressibilityPhasicitySpontaneityPropertiesThrombus Aging       +---------+---------------+---------+-----------+----------+-------------------+ CFV      Full           Yes      Yes                                      +---------+---------------+---------+-----------+----------+-------------------+ SFJ      Full                                                             +---------+---------------+---------+-----------+----------+-------------------+ FV Prox  Full                                                             +---------+---------------+---------+-----------+----------+-------------------+ FV Mid   Full           Yes      Yes                                      +---------+---------------+---------+-----------+----------+-------------------+ FV Distal               Yes      Yes                                      +---------+---------------+---------+-----------+----------+-------------------+ PFV      Full                                                             +---------+---------------+---------+-----------+----------+-------------------+  POP      Full           Yes      Yes                                      +---------+---------------+---------+-----------+----------+-------------------+ PTV                                                   Not well visualized +---------+---------------+---------+-----------+----------+-------------------+ PERO                                                  Not well visualized +---------+---------------+---------+-----------+----------+-------------------+     Summary: RIGHT: - There is no evidence of deep vein thrombosis in the lower extremity. However, portions of this examination were limited- see technologist comments above.  - No cystic structure found in the popliteal fossa.  LEFT: - There is no evidence of deep vein thrombosis in the lower extremity. However, portions of this examination were limited- see technologist comments above.  - No cystic  structure found in the popliteal fossa.  *See table(s) above for measurements and observations.    Preliminary    DG Pelvis 1-2 Views  Result Date: 07/16/2022 CLINICAL DATA:  [place] 2 days ago, pain EXAM: PELVIS - 1-2 VIEW COMPARISON:  07/30/2021 FINDINGS: Frontal view of the pelvis includes both hips. No fracture, subluxation, or dislocation. Joint spaces are well preserved. Soft tissues are unremarkable. IMPRESSION: 1. Unremarkable pelvis. Electronically Signed   By: Randa Ngo M.D.   On: 07/16/2022 15:18   DG Knee Complete 4 Views Left  Result Date: 07/16/2022 CLINICAL DATA:  [place] 2 days ago, pain EXAM: LEFT KNEE - COMPLETE 4+ VIEW COMPARISON:  04/11/2018 FINDINGS: Frontal, bilateral oblique, and cross-table lateral views of the left knee are obtained. No acute fracture, subluxation, or dislocation. 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 No joint effusion. Soft tissues are unremarkable. IMPRESSION: 1. Stable osteoarthritis.  No acute fracture. Electronically Signed   By: Randa Ngo M.D.   On: 07/16/2022 15:17   DG Chest Portable 1 View  Result Date: 07/16/2022 CLINICAL DATA:  [place] 2 days ago, short of breath, COPD EXAM: PORTABLE CHEST 1 VIEW COMPARISON:  05/27/2022 FINDINGS: Single frontal view of the chest demonstrates stable enlargement of the cardiac silhouette. Lung volumes are diminished with crowding the central vasculature. No airspace disease, effusion, or pneumothorax. No acute fracture. IMPRESSION: 1. Enlarged cardiac silhouette. 2. Low lung volumes.  No acute process. Electronically Signed   By: Randa Ngo M.D.   On: 07/16/2022 15:16    Procedures Procedures    Medications Ordered in ED Medications - No data to display  ED Course/ Medical Decision Making/ A&P    Patient seen and examined. History obtained directly from patient.   Labs/EKG: Ordered CBC, CMP, lactate, blood cultures.  Imaging: Ordered bilateral extremity DVT  study.  Medications/Fluids: None ordered  Most recent vital signs reviewed and are as follows: BP (!) 165/102 (BP Location: Right Arm)   Pulse 85   Temp 97.9 F (36.6 C) (Oral)   Resp 16   Ht 5'  4" (1.626 m)   Wt 124.7 kg   LMP 07/01/2021   SpO2 95%   BMI 47.20 kg/m   Initial impression: Leg swelling and redness, medication noncompliance  5:59 PM Reassessment performed. Patient with stable exams.  Seen earlier with Dr. Armandina Gemma.  Patient with pain with palpation or any movement of her left leg.  Labs personally reviewed and interpreted including: CBC with normal white blood cell count, hemoglobin 16.8; CMP unremarkable; lactate 1.2 > 1.1; blood gas showing compensated respiratory acidosis; UDS positive for cocaine; negative pregnancy.  Imaging personally visualized and interpreted including: X-ray of the chest, pelvis, knee, agree negative.  After evaluation with attending physician, x-ray of the ankle was ordered which showed questionable fracture, x-ray of the foot then ordered which does not show fracture.  Reviewed pertinent lab work and imaging with patient at bedside. Questions answered.   Most current vital signs reviewed and are as follows: BP (!) 167/97   Pulse 82   Temp 97.9 F (36.6 C) (Oral)   Resp (!) 23   Ht 5' 4"$  (1.626 m)   Wt 124.7 kg   LMP 07/01/2021   SpO2 94%   BMI 47.20 kg/m   Plan: Will need to ambulate.                                Medical Decision Making Amount and/or Complexity of Data Reviewed Labs: ordered. Radiology: ordered. ECG/medicine tests: ordered.   Patient presents with lower extremity pain, reported fall.  Pain is somewhat out of proportion to her exam.  X-rays are negative.  Ultrasound negative.  Lab work is reassuring and not suggestive of infection.  UDS positive for cocaine.        Final Clinical Impression(s) / ED Diagnoses Final diagnoses:  Pain of left lower extremity    Rx / DC Orders ED Discharge Orders      None         Suann Larry 07/16/22 1810    Regan Lemming, MD 07/16/22 2056    Regan Lemming, MD 07/16/22 2059    Regan Lemming, MD 07/16/22 2101    Regan Lemming, MD 07/16/22 2252

## 2022-07-16 NOTE — Progress Notes (Signed)
Bilateral lower extremity venous duplex has been completed. Preliminary results can be found in CV Proc through chart review.  Results were given to Carlisle Cater PA.  07/16/22 3:37 PM Carlos Levering RVT

## 2022-07-16 NOTE — Discharge Instructions (Signed)
Please read and follow all provided instructions.  Your diagnoses today include:  1. Pain of left lower extremity     Tests performed today include: Your blood work did not show any signs of significant infection today Your x-rays did not show any signs of fractures The ultrasounds of your legs did not show any signs of blood clots Vital signs. See below for your results today.   Medications prescribed:  Please use over-the-counter NSAID medications (ibuprofen, naproxen) or Tylenol (acetaminophen) as directed on the packaging for pain -- as long as you do not have any reasons avoid these medications. Reasons to avoid NSAID medications include: weak kidneys, a history of bleeding in your stomach or gut, or uncontrolled high blood pressure or previous heart attack. Reasons to avoid Tylenol include: liver problems or ongoing alcohol use. Never take more than 4018m or 8 Extra strength Tylenol in a 24 hour period.     Take any prescribed medications only as directed.  Home care instructions:  Follow any educational materials contained in this packet.  BE VERY CAREFUL not to take multiple medicines containing Tylenol (also called acetaminophen). Doing so can lead to an overdose which can damage your liver and cause liver failure and possibly death.   Follow-up instructions: Please follow-up with your primary care provider or the orthopedist listed in the next 1 week for further evaluation of your symptoms.   Return instructions:  Please return to the Emergency Department if you experience worsening symptoms.  Please return if you have any other emergent concerns.  Additional Information:  Your vital signs today were: BP (!) 167/97   Pulse 82   Temp 97.9 F (36.6 C) (Oral)   Resp (!) 23   Ht 5' 4"$  (1.626 m)   Wt 124.7 kg   LMP 07/01/2021   SpO2 94%   BMI 47.20 kg/m  If your blood pressure (BP) was elevated above 135/85 this visit, please have this repeated by your doctor within  one month. --------------

## 2022-07-17 DIAGNOSIS — J441 Chronic obstructive pulmonary disease with (acute) exacerbation: Secondary | ICD-10-CM

## 2022-07-17 LAB — BLOOD GAS, ARTERIAL
Acid-Base Excess: 5.8 mmol/L — ABNORMAL HIGH (ref 0.0–2.0)
Bicarbonate: 32.2 mmol/L — ABNORMAL HIGH (ref 20.0–28.0)
Drawn by: 29503
O2 Saturation: 88.3 %
Patient temperature: 37
pCO2 arterial: 52 mmHg — ABNORMAL HIGH (ref 32–48)
pH, Arterial: 7.4 (ref 7.35–7.45)
pO2, Arterial: 56 mmHg — ABNORMAL LOW (ref 83–108)

## 2022-07-17 MED ORDER — ALBUTEROL SULFATE (2.5 MG/3ML) 0.083% IN NEBU: 2.5000 mg | INHALATION_SOLUTION | RESPIRATORY_TRACT | Status: DC | PRN

## 2022-07-17 MED ORDER — POLYETHYLENE GLYCOL 3350 17 G PO PACK: 17.0000 g | PACK | Freq: Every day | ORAL | Status: DC | PRN

## 2022-07-17 MED ORDER — ONDANSETRON HCL 4 MG PO TABS
4.0000 mg | ORAL_TABLET | Freq: Four times a day (QID) | ORAL | Status: DC | PRN
  Administered 2022-07-23: 4 mg via ORAL
  Filled 2022-07-17: qty 1

## 2022-07-17 MED ORDER — DOCUSATE SODIUM 100 MG PO CAPS
100.0000 mg | ORAL_CAPSULE | Freq: Two times a day (BID) | ORAL | Status: DC
  Administered 2022-07-17 – 2022-07-26 (×18): 100 mg via ORAL
  Filled 2022-07-17 (×18): qty 1

## 2022-07-17 MED ORDER — ENOXAPARIN SODIUM 60 MG/0.6ML IJ SOSY
60.0000 mg | PREFILLED_SYRINGE | INTRAMUSCULAR | Status: DC
  Administered 2022-07-17 – 2022-07-25 (×9): 60 mg via SUBCUTANEOUS
  Filled 2022-07-17 (×10): qty 0.6

## 2022-07-17 MED ORDER — ACETAMINOPHEN 325 MG PO TABS
650.0000 mg | ORAL_TABLET | Freq: Four times a day (QID) | ORAL | Status: DC | PRN
  Administered 2022-07-21 – 2022-07-23 (×4): 650 mg via ORAL
  Filled 2022-07-17 (×6): qty 2

## 2022-07-17 MED ORDER — ONDANSETRON HCL 4 MG/2ML IJ SOLN: 4.0000 mg | Freq: Four times a day (QID) | INTRAMUSCULAR | Status: DC | PRN

## 2022-07-17 MED ORDER — MOMETASONE FURO-FORMOTEROL FUM 100-5 MCG/ACT IN AERO
2.0000 | INHALATION_SPRAY | Freq: Two times a day (BID) | RESPIRATORY_TRACT | Status: DC
  Administered 2022-07-17 – 2022-07-26 (×18): 2 via RESPIRATORY_TRACT
  Filled 2022-07-17: qty 8.8

## 2022-07-17 MED ORDER — ACETAMINOPHEN 650 MG RE SUPP: 650.0000 mg | Freq: Four times a day (QID) | RECTAL | Status: DC | PRN

## 2022-07-17 MED ORDER — TRAZODONE HCL 50 MG PO TABS
25.0000 mg | ORAL_TABLET | Freq: Every evening | ORAL | Status: DC | PRN
  Administered 2022-07-23: 25 mg via ORAL
  Filled 2022-07-17: qty 1

## 2022-07-17 NOTE — ED Notes (Signed)
Pt sitting up in bed eating lunch

## 2022-07-17 NOTE — ED Notes (Signed)
Patient currently awake and asking for lunch. Psych NP notified to come assess patient.

## 2022-07-17 NOTE — ED Notes (Signed)
Patient laying on stretcher resting. Door open and lights turned on for visibility.

## 2022-07-17 NOTE — ED Notes (Signed)
ED TO INPATIENT HANDOFF REPORT  Name/Age/Gender Sarah Cervantes 42 y.o. female  Code Status    Code Status Orders  (From admission, onward)           Start     Ordered   07/17/22 1541  Full code  Continuous       Question:  By:  Answer:  Consent: discussion documented in EHR   07/17/22 1541           Code Status History     Date Active Date Inactive Code Status Order ID Comments User Context   05/26/2022 1438 05/27/2022 1556 Full Code [id number]  Domenic Moras, PA-C ED   03/31/2022 1833 04/06/2022 2105 Full Code [id number]  Inda Merlin, NP Inpatient   03/30/2022 2123 03/31/2022 1712 Full Code [id number]  Evlyn Courier, PA-C ED   03/14/2022 1729 03/16/2022 2013 Full Code [id number]  Lavina Hamman, MD ED   07/15/2021 1603 07/16/2021 2355 Full Code [id number]  Blanchie Dessert, MD ED   04/10/2018 0656 04/23/2018 1853 Full Code [id number]  Rozetta Nunnery, NP Inpatient   04/10/2018 0049 04/10/2018 0346 Full Code [id number]  Orpah Greek, MD ED   03/26/2018 2012 04/04/2018 1029 Full Code [id number]  Consuello Closs, NP Inpatient   03/23/2018 1639 03/26/2018 2007 Full Code [id number]  Georgette Shell, MD ED       Home/SNF/Other Home  Chief Complaint Suicidal ideation [R45.851]  Level of Care/Admitting Diagnosis ED Disposition     ED Disposition  Admit   Condition  --   [place] [facility] Area: [facility] [[id number]]  Level of Care: Med-Surg [16]  May place patient in observation at [facility] or [facility] if equivalent level of care is available:: Yes  Covid Evaluation: Asymptomatic - no recent exposure (last 10 days) testing not required  Diagnosis: Suicidal ideation [V62.84.ICD-9-CM]  Admitting Physician: Lucillie Garfinkel [id number]  Attending Physician: Hollice Gong, MIR Kari.[place] [id number]          Medical History Past Medical History:  Diagnosis Date   Cocaine abuse, daily use ([place]) 03/23/2018   ETOH abuse 03/23/2018    Heroin abuse ([place]) 03/23/2018    Allergies Allergies  Allergen Reactions   Latex Swelling, Rash and Other (See Comments)    Hands swell    IV Location/Drains/Wounds Patient Lines/Drains/Airways Status     Active Line/Drains/Airways     Name Placement date Placement time Site Days   Peripheral IV 07/16/22 20 G Right Antecubital 07/16/22  1400  Antecubital  1            Labs/Imaging Results for orders placed or performed during the hospital encounter of 07/16/22 (from the past 48 hour(s))  Lactic acid, plasma     Status: None   Collection Time: 07/16/22  1:20 PM  Result Value Ref Range   Lactic Acid, Venous 1.2 0.5 - 1.9 mmol/L    Comment: Performed at [facility], [place] [address] [place], [place] [address]  Comprehensive metabolic panel     Status: Abnormal   Collection Time: 07/16/22  1:20 PM  Result Value Ref Range   Sodium 139 135 - 145 mmol/L   Potassium 4.3 3.5 - 5.1 mmol/L   Chloride 103 98 - 111 mmol/L   CO2 27 22 - 32 mmol/L   Glucose, Bld 103 (H) 70 - 99 mg/dL    Comment: Glucose reference range applies only to samples taken after fasting for at least 8 hours.  BUN 9 6 - 20 mg/dL   Creatinine, Ser 0.87 0.44 - 1.00 mg/dL   Calcium 8.8 (L) 8.9 - 10.3 mg/dL   Total Protein 7.7 6.5 - 8.1 g/dL   Albumin 3.6 3.5 - 5.0 g/dL   AST 17 15 - 41 U/L   ALT 14 0 - 44 U/L   Alkaline Phosphatase 64 38 - 126 U/L   Total Bilirubin 0.4 0.3 - 1.2 mg/dL   GFR, Estimated >60 >60 mL/min    Comment: (NOTE) Calculated using the CKD-EPI Creatinine Equation (2021)    Anion gap 9 5 - 15    Comment: Performed at [facility], [place] [address] [place], [place] [address]  CBC with Differential     Status: Abnormal   Collection Time: 07/16/22  1:20 PM  Result Value Ref Range   WBC 8.2 4.0 - 10.5 K/uL   RBC 5.77 (H) 3.87 - 5.11 MIL/uL   Hemoglobin 16.8 (H) 12.0 - 15.0 g/dL   HCT 54.1 (H) 36.0 - 46.0 %   MCV 93.8 80.0 - 100.0 fL   MCH 29.1  26.0 - 34.0 pg   MCHC 31.1 30.0 - 36.0 g/dL   RDW 18.3 (H) 11.5 - 15.5 %   Platelets 392 150 - 400 K/uL   nRBC 0.0 0.0 - 0.2 %   Neutrophils Relative % 62 %   Neutro Abs 5.1 1.7 - 7.7 K/uL   Lymphocytes Relative 26 %   Lymphs Abs 2.1 0.7 - 4.0 K/uL   Monocytes Relative 8 %   Monocytes Absolute 0.6 0.1 - 1.0 K/uL   Eosinophils Relative 3 %   Eosinophils Absolute 0.3 0.0 - 0.5 K/uL   Basophils Relative 1 %   Basophils Absolute 0.1 0.0 - 0.1 K/uL   Immature Granulocytes 0 %   Abs Immature Granulocytes 0.03 0.00 - 0.07 K/uL    Comment: Performed at [facility], [place] [address] [place], [place] [address]  Blood Culture (routine x 2)     Status: None (Preliminary result)   Collection Time: 07/16/22  1:20 PM   Specimen: BLOOD  Result Value Ref Range   Specimen Description      BLOOD RIGHT ANTECUBITAL Performed at [facility] [address], [place], [place] [address]    Special Requests      BOTTLES DRAWN AEROBIC AND ANAEROBIC Blood Culture adequate volume Performed at [facility] [address], [place], [place] [address]    Culture      NO GROWTH < 24 HOURS Performed at [facility] [address], [place], [place] [address]    Report Status PENDING   Blood gas, venous ([facility], [facility], [facility])     Status: Abnormal   Collection Time: 07/16/22  1:21 PM  Result Value Ref Range   pH, Ven 7.34 7.25 - 7.43   pCO2, Ven 64 (H) 44 - 60 mmHg   pO2, Ven <31 (LL) 32 - 45 mmHg    Comment: CRITICAL RESULT CALLED TO, READ BACK BY AND VERIFIED WITH: SAVOIE,B RN AT 1421 07/16/22 BY TIBBITTS, K    Bicarbonate 34.5 (H) 20.0 - 28.0 mmol/L   Acid-Base Excess 5.9 (H) 0.0 - 2.0 mmol/L   O2 Saturation 33.5 %   Patient temperature 37.0     Comment: Performed at [facility], [place] [address], [place], [place] [address]  Blood Culture (routine x 2)     Status: None (Preliminary result)  Collection Time: 07/16/22  1:25 PM    Specimen: BLOOD  Result Value Ref Range   Specimen Description      BLOOD SITE NOT SPECIFIED Performed at [facility] [address] [place], [place] [address]    Special Requests      BOTTLES DRAWN AEROBIC AND ANAEROBIC Blood Culture adequate volume Performed at [facility] [address] [place], [place] [address]    Culture      NO GROWTH < 24 HOURS Performed at [facility] [address] [place], [place] [address]    Report Status PENDING   Rapid urine drug screen (hospital performed)     Status: Abnormal   Collection Time: 07/16/22  1:41 PM  Result Value Ref Range   Opiates NONE DETECTED NONE DETECTED   Cocaine POSITIVE (A) NONE DETECTED   Benzodiazepines NONE DETECTED NONE DETECTED   Amphetamines NONE DETECTED NONE DETECTED   Tetrahydrocannabinol NONE DETECTED NONE DETECTED   Barbiturates NONE DETECTED NONE DETECTED    Comment: (NOTE) DRUG SCREEN FOR MEDICAL PURPOSES ONLY.  IF CONFIRMATION IS NEEDED FOR ANY PURPOSE, NOTIFY LAB WITHIN 5 DAYS.  LOWEST DETECTABLE LIMITS FOR URINE DRUG SCREEN Drug Class                     Cutoff (ng/mL) Amphetamine and metabolites    1000 Barbiturate and metabolites    200 Benzodiazepine                 200 Opiates and metabolites        300 Cocaine and metabolites        300 THC                            50 Performed at [facility], [place] [address] [place], [place] [address]   I-Stat beta hCG blood, ED     Status: None   Collection Time: 07/16/22  2:21 PM  Result Value Ref Range   I-stat hCG, quantitative <5.0 <5 mIU/mL   Comment 3            Comment:   GEST. AGE      CONC.  (mIU/mL)   <=1 WEEK        5 - 50     2 WEEKS       50 - 500     3 WEEKS       100 - 10,000     4 WEEKS     1,000 - 30,000        FEMALE AND NON-PREGNANT FEMALE:     LESS THAN 5 mIU/mL   Lactic acid, plasma     Status: None   Collection Time: 07/16/22  3:20 PM  Result Value Ref  Range   Lactic Acid, Venous 1.1 0.5 - 1.9 mmol/L    Comment: Performed at [facility], [place] [address], [place], [place] [address]  Resp panel by RT-PCR (RSV, Flu A&B, Covid) Anterior Nasal Swab     Status: None   Collection Time: 07/16/22  7:16 PM   Specimen: Anterior Nasal Swab  Result Value Ref Range   SARS Coronavirus 2 by RT PCR NEGATIVE NEGATIVE    Comment: (NOTE) SARS-CoV-2 target nucleic acids are NOT DETECTED.  The SARS-CoV-2 RNA is generally detectable in upper respiratory specimens during the acute phase of infection. The lowest concentration of SARS-CoV-2 viral  copies this assay can detect is 138 copies/mL. A negative result does not preclude SARS-Cov-2 infection and should not be used as the sole basis for treatment or other patient management decisions. A negative result may occur with  improper specimen collection/handling, submission of specimen other than nasopharyngeal swab, presence of viral mutation(s) within the areas targeted by this assay, and inadequate number of viral copies(<138 copies/mL). A negative result must be combined with clinical observations, patient history, and epidemiological information. The expected result is Negative.  Fact Sheet for Patients:  EntrepreneurPulse.com.au  Fact Sheet for Healthcare Providers:  IncredibleEmployment.be  This test is no t yet approved or cleared by the [place] FDA and  has been authorized for detection and/or diagnosis of SARS-CoV-2 by FDA under an Emergency Use Authorization (EUA). This EUA will remain  in effect (meaning this test can be used) for the duration of the COVID-19 declaration under Section 564(b)(1) of the Act, 21 U.S.C.section 360bbb-3(b)(1), unless the authorization is terminated  or revoked sooner.       Influenza A by PCR NEGATIVE NEGATIVE   Influenza B by PCR NEGATIVE NEGATIVE    Comment: (NOTE) The Xpert Xpress  SARS-CoV-2/FLU/RSV plus assay is intended as an aid in the diagnosis of influenza from Nasopharyngeal swab specimens and should not be used as a sole basis for treatment. Nasal washings and aspirates are unacceptable for Xpert Xpress SARS-CoV-2/FLU/RSV testing.  Fact Sheet for Patients: EntrepreneurPulse.com.au  Fact Sheet for Healthcare Providers: IncredibleEmployment.be  This test is not yet approved or cleared by the [place] FDA and has been authorized for detection and/or diagnosis of SARS-CoV-2 by FDA under an Emergency Use Authorization (EUA). This EUA will remain in effect (meaning this test can be used) for the duration of the COVID-19 declaration under Section 564(b)(1) of the Act, 21 U.S.C. section 360bbb-3(b)(1), unless the authorization is terminated or revoked.     Resp Syncytial Virus by PCR NEGATIVE NEGATIVE    Comment: (NOTE) Fact Sheet for Patients: EntrepreneurPulse.com.au  Fact Sheet for Healthcare Providers: IncredibleEmployment.be  This test is not yet approved or cleared by the [place] FDA and has been authorized for detection and/or diagnosis of SARS-CoV-2 by FDA under an Emergency Use Authorization (EUA). This EUA will remain in effect (meaning this test can be used) for the duration of the COVID-19 declaration under Section 564(b)(1) of the Act, 21 U.S.C. section 360bbb-3(b)(1), unless the authorization is terminated or revoked.  Performed at [facility], [place] [address] [place], [place] [address]   Ethanol     Status: None   Collection Time: 07/16/22  8:08 PM  Result Value Ref Range   Alcohol, Ethyl (B) <10 <10 mg/dL    Comment: (NOTE) Lowest detectable limit for serum alcohol is 10 mg/dL.  For medical purposes only. Performed at [facility], [place] [address], [place], [place] [address]   Blood gas, arterial     Status:  Abnormal   Collection Time: 07/17/22  4:12 PM  Result Value Ref Range   pH, Arterial 7.4 7.35 - 7.45   pCO2 arterial 52 (H) 32 - 48 mmHg   pO2, Arterial 56 (L) 83 - 108 mmHg   Bicarbonate 32.2 (H) 20.0 - 28.0 mmol/L   Acid-Base Excess 5.8 (H) 0.0 - 2.0 mmol/L   O2 Saturation 88.3 %   Patient temperature 37.0    Collection site LEFT RADIAL    Drawn by [id number]    [place] test (pass/fail) PASS PASS  Comment: Performed at [facility], [place] [address] [place], [place] [address]   DG Foot Complete Left  Result Date: 07/16/2022 CLINICAL DATA:  Ankle x-ray EXAM: LEFT FOOT - COMPLETE 3+ VIEW COMPARISON:  None Available. FINDINGS: No fracture or dislocation of mid foot or forefoot. The phalanges are normal. The calcaneus is normal. No soft tissue abnormality. Enthesopathic spurring along the plantar aspect of the calcaneus. No fracture at the base of the fifth metatarsal IMPRESSION: No fracture or dislocation. Electronically Signed   By: Suzy Bouchard M.D.   On: 07/16/2022 17:35   VAS [place] LOWER EXTREMITY VENOUS (DVT) (ONLY [facility] & [facility])  Result Date: 07/16/2022  Lower Venous DVT Study Patient Name:  CIARA SOULIA  Date of Exam:   07/16/2022 Medical Rec #: [id number]          Accession #:    [id number] Date of Birth: 1980/12/25          Patient Gender: F Patient Age:   67 years Exam Location:  [facility] Procedure:      VAS [place] LOWER EXTREMITY VENOUS (DVT) Referring Phys: Vonna Kotyk GEIPLE --------------------------------------------------------------------------------  Indications: Swelling.  Risk Factors: None identified. Limitations: Body habitus, poor ultrasound/tissue interface and poor patient cooperation, patient positioning, patient pain tolerance. Comparison Study: No prior studies. Performing Technologist: Oliver Hum RVT  Examination Guidelines: A complete evaluation includes B-mode imaging, spectral Doppler, color Doppler, and power Doppler as needed of all accessible  portions of each vessel. Bilateral testing is considered an integral part of a complete examination. Limited examinations for reoccurring indications may be performed as noted. The reflux portion of the exam is performed with the patient in reverse Trendelenburg.  +---------+---------------+---------+-----------+----------+-------------------+ RIGHT    CompressibilityPhasicitySpontaneityPropertiesThrombus Aging      +---------+---------------+---------+-----------+----------+-------------------+ CFV      Full           Yes      Yes                                      +---------+---------------+---------+-----------+----------+-------------------+ SFJ      Full                                                             +---------+---------------+---------+-----------+----------+-------------------+ FV Prox  Full                                                             +---------+---------------+---------+-----------+----------+-------------------+ FV Mid   Full                                                             +---------+---------------+---------+-----------+----------+-------------------+ FV Distal               Yes      Yes                                      +---------+---------------+---------+-----------+----------+-------------------+  PFV      Full                                                             +---------+---------------+---------+-----------+----------+-------------------+ POP      Full           Yes      Yes                                      +---------+---------------+---------+-----------+----------+-------------------+ PTV      Full                                                             +---------+---------------+---------+-----------+----------+-------------------+ PERO                                                  Not well visualized  +---------+---------------+---------+-----------+----------+-------------------+   +---------+---------------+---------+-----------+----------+-------------------+ LEFT     CompressibilityPhasicitySpontaneityPropertiesThrombus Aging      +---------+---------------+---------+-----------+----------+-------------------+ CFV      Full           Yes      Yes                                      +---------+---------------+---------+-----------+----------+-------------------+ SFJ      Full                                                             +---------+---------------+---------+-----------+----------+-------------------+ FV Prox  Full                                                             +---------+---------------+---------+-----------+----------+-------------------+ FV Mid   Full           Yes      Yes                                      +---------+---------------+---------+-----------+----------+-------------------+ FV Distal               Yes      Yes                                      +---------+---------------+---------+-----------+----------+-------------------+ PFV      Full                                                             +---------+---------------+---------+-----------+----------+-------------------+  POP      Full           Yes      Yes                                      +---------+---------------+---------+-----------+----------+-------------------+ PTV                                                   Not well visualized +---------+---------------+---------+-----------+----------+-------------------+ PERO                                                  Not well visualized +---------+---------------+---------+-----------+----------+-------------------+     Summary: RIGHT: - There is no evidence of deep vein thrombosis in the lower extremity. However, portions of this examination were limited- see technologist  comments above.  - No cystic structure found in the popliteal fossa.  LEFT: - There is no evidence of deep vein thrombosis in the lower extremity. However, portions of this examination were limited- see technologist comments above.  - No cystic structure found in the popliteal fossa.  *See table(s) above for measurements and observations. Electronically signed by Deitra Mayo MD on 07/16/2022 at 5:35:16 PM.    Final    DG Ankle Complete Left  Result Date: 07/16/2022 CLINICAL DATA:  Ankle pain status fall 2 days ago. Technologist notes best obtainable images. EXAM: LEFT ANKLE COMPLETE - 3+ VIEW COMPARISON:  None Available. FINDINGS: Suboptimal positioning on AP view. There is no evidence of dislocation or joint effusion. Cortical irregularity of the base of the fifth toe metatarsal, seen on oblique view only. Moderate posterior and small plantar calcaneal enthesophytes. Mild osteoarthritis of the tibiotalar joint. Soft tissues are unremarkable. IMPRESSION: Cortical irregularity noted at the base of the fifth toe metatarsal, seen on oblique view only. Recommend correlation for point tenderness and dedicated left foot radiographs for further evaluation. No acute osseous abnormality of the left ankle. Electronically Signed   By: Ileana Roup M.D.   On: 07/16/2022 16:39   DG Pelvis 1-2 Views  Result Date: 07/16/2022 CLINICAL DATA:  [place] 2 days ago, pain EXAM: PELVIS - 1-2 VIEW COMPARISON:  07/30/2021 FINDINGS: Frontal view of the pelvis includes both hips. No fracture, subluxation, or dislocation. Joint spaces are well preserved. Soft tissues are unremarkable. IMPRESSION: 1. Unremarkable pelvis. Electronically Signed   By: Randa Ngo M.D.   On: 07/16/2022 15:18   DG Knee Complete 4 Views Left  Result Date: 07/16/2022 CLINICAL DATA:  [place] 2 days ago, pain EXAM: LEFT KNEE - COMPLETE 4+ VIEW COMPARISON:  04/11/2018 FINDINGS: Frontal, bilateral oblique, and cross-table lateral views of the left knee are  obtained. No acute fracture, subluxation, or dislocation. 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 No joint effusion. Soft tissues are unremarkable. IMPRESSION: 1. Stable osteoarthritis.  No acute fracture. Electronically Signed   By: Randa Ngo M.D.   On: 07/16/2022 15:17   DG Chest Portable 1 View  Result Date: 07/16/2022 CLINICAL DATA:  [place] 2 days ago, short of breath, COPD EXAM: PORTABLE CHEST 1 VIEW COMPARISON:  05/27/2022 FINDINGS: Single frontal view of the chest  demonstrates stable enlargement of the cardiac silhouette. Lung volumes are diminished with crowding the central vasculature. No airspace disease, effusion, or pneumothorax. No acute fracture. IMPRESSION: 1. Enlarged cardiac silhouette. 2. Low lung volumes.  No acute process. Electronically Signed   By: Randa Ngo M.D.   On: 07/16/2022 15:16    Pending Labs Unresulted Labs (From admission, onward)     Start     Ordered   07/24/22 0500  Creatinine, serum  (enoxaparin (LOVENOX)    CrCl >/= 30 ml/min)  Weekly,   R     Comments: while on enoxaparin therapy    07/17/22 1541   07/17/22 1540  CBC  (enoxaparin (LOVENOX)    CrCl >/= 30 ml/min)  Once,   R       Comments: Baseline for enoxaparin therapy IF NOT ALREADY DRAWN.  Notify MD if PLT < 100 K.    07/17/22 1541   07/17/22 1540  Creatinine, serum  (enoxaparin (LOVENOX)    CrCl >/= 30 ml/min)  Once,   R       Comments: Baseline for enoxaparin therapy IF NOT ALREADY DRAWN.    07/17/22 1541            Vitals/Pain Today's Vitals   07/17/22 1345 07/17/22 1400 07/17/22 1430 07/17/22 1513  BP:  (!) 162/101 (!) 162/106 (!) 163/98  Pulse:    80  Resp:  (!) 25 (!) 22 (!) 23  Temp:    98 F (36.7 C)  TempSrc:    Oral  SpO2: 99%   94%  Weight:      Height:      PainSc:        Isolation Precautions Airborne and Contact precautions  Medications Medications  hydrOXYzine (ATARAX) tablet 50 mg (has no administration in time  range)  ibuprofen (ADVIL) tablet 400 mg (400 mg Oral Given 07/17/22 0500)  lisinopril (ZESTRIL) tablet 10 mg (10 mg Oral Given 07/17/22 1055)  senna (SENOKOT) tablet 8.6 mg (8.6 mg Oral Given 07/17/22 1055)  traZODone (DESYREL) tablet 100 mg (100 mg Oral Given 07/16/22 2207)  gabapentin (NEURONTIN) capsule 800 mg (800 mg Oral Given 07/17/22 1055)  FLUoxetine (PROZAC) capsule 40 mg (40 mg Oral Given 07/17/22 1055)  ARIPiprazole (ABILIFY) tablet 10 mg (10 mg Oral Given 07/17/22 1055)  amLODipine (NORVASC) tablet 5 mg (5 mg Oral Given 07/17/22 1055)  mometasone-formoterol (DULERA) 100-5 MCG/ACT inhaler 2 puff (has no administration in time range)  enoxaparin (LOVENOX) injection 60 mg (has no administration in time range)  acetaminophen (TYLENOL) tablet 650 mg (has no administration in time range)    Or  acetaminophen (TYLENOL) suppository 650 mg (has no administration in time range)  traZODone (DESYREL) tablet 25 mg (has no administration in time range)  docusate sodium (COLACE) capsule 100 mg (has no administration in time range)  polyethylene glycol (MIRALAX / GLYCOLAX) packet 17 g (has no administration in time range)  ondansetron (ZOFRAN) tablet 4 mg (has no administration in time range)    Or  ondansetron (ZOFRAN) injection 4 mg (has no administration in time range)  albuterol (PROVENTIL) (2.5 MG/3ML) 0.083% nebulizer solution 2.5 mg (has no administration in time range)    Mobility non-ambulatory

## 2022-07-17 NOTE — H&P (Signed)
History and Physical  Sarah Cervantes [id number] DOB: [birth year] DOA: 07/16/2022   PCP: Patient, No Pcp Per   Patient coming from: Home   Chief Complaint: Suicidal Ideation   HPI: Sarah Cervantes is a 42 y.o. female with medical history significant for daily cocaine abuse, alcohol abuse, substance abuse related ecchymosis, bipolar 1 disorder, previous suicide attempts by overdose and self-harm, as well as COPD supposed to be on 2 L nasal cannula oxygen chronically who is being admitted to the hospital with complaints of knee pain, hip and back pain after falling 2 days ago.  She was evaluated after this fall in the emergency department, discharged from the hospital as workup was negative.  Today she presented with continuation of discomfort, and also mentioned that she is having suicidal ideation.  She was seen by psychiatry, endorsed suicidal ideation with a plan to overdose on medications.  There is that she has also been engaging in superficial cutting, last about 2 weeks ago.  Patient is at risk for homelessness, currently staying in a motel often stays with friends.  She has a history of COPD, is supposed to be on 2 L nasal cannula oxygen chronically, however has not been using it as she is homeless.  Today on evaluation she was saturating 88% on room air.  She denies any chest pain, cough, significant shortness of breath.  During my evaluation of the patient, she is incredibly somnolent, though after repeated arousal, she is oriented x 4, answering questions appropriately.  However she is not a very good historian, not very specific in her complaints.  As above, patient was seen by psychiatry in the emergency department, they agree the patient may benefit from inpatient psychiatric services given her significant psychiatric history, substance abuse, and suicidal ideation.  However they feel the patient is not medically stable for inpatient psychiatric admission, as such they recommend  hospitalist admission for stabilization of her medical problems.  Review of Systems: Please see HPI for pertinent positives and negatives. A complete 10 system review of systems are otherwise negative.  Past Medical History:  Diagnosis Date   Cocaine abuse, daily use ([place]) 03/23/2018   ETOH abuse 03/23/2018   Heroin abuse ([place]) 03/23/2018   No past surgical history on file.  Social History:  reports that she quit smoking about 6 months ago. Her smoking use included cigarettes. She has a 15.00 pack-year smoking history. She has never used smokeless tobacco. She reports current alcohol use of about 12.0 - 15.0 standard drinks of alcohol per week. She reports current drug use. Drug: Cocaine.   Allergies  Allergen Reactions   Latex Swelling, Rash and Other (See Comments)    Hands swell    No family history on file.   Prior to Admission medications   Medication Sig Start Date End Date Taking? Authorizing Provider  budesonide-formoterol (SYMBICORT) 80-4.5 MCG/ACT inhaler 2 puffs daily. 02/28/22  Yes [provider]  acetaminophen (TYLENOL) 325 MG tablet Take 2 tablets (650 mg total) by mouth every 6 (six) hours as needed for mild pain (or Fever >/= 101). 03/16/22   Sheikh, Georgina Quint Latif, DO  albuterol (VENTOLIN HFA) 108 (90 Base) MCG/ACT inhaler Inhale 2 puffs into the lungs every 6 (six) hours as needed for wheezing or shortness of breath.    [provider]  amLODipine (NORVASC) 5 MG tablet Take 1 tablet (5 mg total) by mouth daily. 04/06/22 05/06/22  Massengill, Ovid Curd, MD  ARIPiprazole (ABILIFY) 10 MG tablet Take 1 tablet (10 mg  total) by mouth daily. 04/07/22 07/17/22  Massengill, Ovid Curd, MD  FLUoxetine (PROZAC) 40 MG capsule Take 1 capsule (40 mg total) by mouth daily. 04/07/22 07/17/22  Massengill, Ovid Curd, MD  furosemide (LASIX) 40 MG tablet Take 1 tablet (40 mg total) by mouth daily. 04/06/22 05/06/22  Massengill, Ovid Curd, MD  gabapentin (NEURONTIN) 400 MG capsule Take 2  capsules (800 mg total) by mouth 3 (three) times daily. 04/06/22 05/06/22  Massengill, Ovid Curd, MD  hydrocortisone (ANUSOL-HC) 2.5 % rectal cream Place rectally 3 (three) times daily. Patient taking differently: Place 1 Application rectally daily as needed for hemorrhoids. 03/26/18   Charlynne Cousins, MD  hydrOXYzine (ATARAX) 50 MG tablet Take 1 tablet (50 mg total) by mouth 3 (three) times daily as needed for anxiety. 04/06/22   Massengill, Ovid Curd, MD  ibuprofen (ADVIL) 200 MG tablet Take 400 mg by mouth every 8 (eight) hours as needed for headache or mild pain.    [provider]  lidocaine (LIDODERM) 5 % Place 1 patch onto the skin daily. Remove & Discard patch within 12 hours or as directed by MD Patient taking differently: Place 2 patches onto the skin daily. Remove & Discard patch within 12 hours or as directed by MD 03/16/22   Raiford Noble Latif, DO  lisinopril (ZESTRIL) 10 MG tablet Take 1 tablet (10 mg total) by mouth in the morning. 04/06/22 05/06/22  Massengill, Ovid Curd, MD  nicotine polacrilex (NICORETTE) 2 MG gum Take 1 each (2 mg total) by mouth as needed for smoking cessation. 04/06/22   Massengill, Ovid Curd, MD  OZEMPIC, 0.25 OR 0.5 MG/DOSE, 2 MG/1.5ML SOPN Inject 0.5 mg into the skin every Monday.    [provider]  pantoprazole (PROTONIX) 40 MG tablet Take 1 tablet (40 mg total) by mouth daily. 04/07/22 05/07/22  Massengill, Ovid Curd, MD  prazosin (MINIPRESS) 1 MG capsule Take 1 capsule (1 mg total) by mouth at bedtime. 04/06/22 05/06/22  Massengill, Ovid Curd, MD  predniSONE (DELTASONE) 10 MG tablet Take 2 tablets (20 mg total) by mouth daily. 05/27/22   Milton Ferguson, MD  senna (SENOKOT) 8.6 MG TABS tablet Take 1 tablet (8.6 mg total) by mouth daily. 04/06/22   Massengill, Ovid Curd, MD  traZODone (DESYREL) 100 MG tablet Take 1 tablet (100 mg total) by mouth at bedtime. 04/06/22 05/06/22  Massengill, Ovid Curd, MD    Physical Exam: BP (!) 163/98 (BP Location: Right Arm)   Pulse  80   Temp 98 F (36.7 C) (Oral)   Resp (!) 23   Ht 5' 4"$  (1.626 m)   Wt 124.7 kg   LMP 07/01/2021   SpO2 94%   BMI 47.20 kg/m   General:  Alert, oriented, calm, in no acute distress  Eyes: EOMI, clear conjuctivae, white sclerea Neck: supple, no masses, trachea mildline  Cardiovascular: RRR, no murmurs or rubs, no peripheral edema  Respiratory: clear to auscultation bilaterally, no wheezes, no crackles  Abdomen: soft, nontender, nondistended, normal bowel tones heard  Skin: dry, no rashes  Musculoskeletal: no joint effusions, normal range of motion  Psychiatric: appropriate affect, normal speech  Neurologic: extraocular muscles intact, clear speech, moving all extremities with intact sensorium          Labs on Admission:  Basic Metabolic Panel: Recent Labs  Lab 07/16/22 1320  NA 139  K 4.3  CL 103  CO2 27  GLUCOSE 103*  BUN 9  CREATININE 0.87  CALCIUM 8.8*   Liver Function Tests: Recent Labs  Lab 07/16/22 1320  AST 17  ALT 14  ALKPHOS 64  BILITOT 0.4  PROT 7.7  ALBUMIN 3.6   No results for input(s): "LIPASE", "AMYLASE" in the last 168 hours. No results for input(s): "AMMONIA" in the last 168 hours. CBC: Recent Labs  Lab 07/16/22 1320  WBC 8.2  NEUTROABS 5.1  HGB 16.8*  HCT 54.1*  MCV 93.8  PLT 392   Cardiac Enzymes: No results for input(s): "CKTOTAL", "CKMB", "CKMBINDEX", "TROPONINI" in the last 168 hours.  BNP (last 3 results) Recent Labs    05/27/22 2130  BNP 190.3*    ProBNP (last 3 results) No results for input(s): "PROBNP" in the last 8760 hours.  CBG: No results for input(s): "GLUCAP" in the last 168 hours.  Radiological Exams on Admission: DG Foot Complete Left  Result Date: 07/16/2022 CLINICAL DATA:  Ankle x-ray EXAM: LEFT FOOT - COMPLETE 3+ VIEW COMPARISON:  None Available. FINDINGS: No fracture or dislocation of mid foot or forefoot. The phalanges are normal. The calcaneus is normal. No soft tissue abnormality. Enthesopathic  spurring along the plantar aspect of the calcaneus. No fracture at the base of the fifth metatarsal IMPRESSION: No fracture or dislocation. Electronically Signed   By: Suzy Bouchard M.D.   On: 07/16/2022 17:35   VAS [place] LOWER EXTREMITY VENOUS (DVT) (ONLY [facility] & [facility])  Result Date: 07/16/2022  Lower Venous DVT Study Patient Name:  Sarah Cervantes  Date of Exam:   07/16/2022 Medical Rec #: [id number]          Accession #:    [id number] Date of Birth: [birth year]          Patient Gender: F Patient Age:   1 years Exam Location:  [facility] Procedure:      VAS [place] LOWER EXTREMITY VENOUS (DVT) Referring Phys: Vonna Kotyk GEIPLE --------------------------------------------------------------------------------  Indications: Swelling.  Risk Factors: None identified. Limitations: Body habitus, poor ultrasound/tissue interface and poor patient cooperation, patient positioning, patient pain tolerance. Comparison Study: No prior studies. Performing Technologist: Oliver Hum RVT  Examination Guidelines: A complete evaluation includes B-mode imaging, spectral Doppler, color Doppler, and power Doppler as needed of all accessible portions of each vessel. Bilateral testing is considered an integral part of a complete examination. Limited examinations for reoccurring indications may be performed as noted. The reflux portion of the exam is performed with the patient in reverse Trendelenburg.  +---------+---------------+---------+-----------+----------+-------------------+ RIGHT    CompressibilityPhasicitySpontaneityPropertiesThrombus Aging      +---------+---------------+---------+-----------+----------+-------------------+ CFV      Full           Yes      Yes                                      +---------+---------------+---------+-----------+----------+-------------------+ SFJ      Full                                                              +---------+---------------+---------+-----------+----------+-------------------+ FV Prox  Full                                                             +---------+---------------+---------+-----------+----------+-------------------+  FV Mid   Full                                                             +---------+---------------+---------+-----------+----------+-------------------+ FV Distal               Yes      Yes                                      +---------+---------------+---------+-----------+----------+-------------------+ PFV      Full                                                             +---------+---------------+---------+-----------+----------+-------------------+ POP      Full           Yes      Yes                                      +---------+---------------+---------+-----------+----------+-------------------+ PTV      Full                                                             +---------+---------------+---------+-----------+----------+-------------------+ PERO                                                  Not well visualized +---------+---------------+---------+-----------+----------+-------------------+   +---------+---------------+---------+-----------+----------+-------------------+ LEFT     CompressibilityPhasicitySpontaneityPropertiesThrombus Aging      +---------+---------------+---------+-----------+----------+-------------------+ CFV      Full           Yes      Yes                                      +---------+---------------+---------+-----------+----------+-------------------+ SFJ      Full                                                             +---------+---------------+---------+-----------+----------+-------------------+ FV Prox  Full                                                             +---------+---------------+---------+-----------+----------+-------------------+ FV  Mid   Full  Yes      Yes                                      +---------+---------------+---------+-----------+----------+-------------------+ FV Distal               Yes      Yes                                      +---------+---------------+---------+-----------+----------+-------------------+ PFV      Full                                                             +---------+---------------+---------+-----------+----------+-------------------+ POP      Full           Yes      Yes                                      +---------+---------------+---------+-----------+----------+-------------------+ PTV                                                   Not well visualized +---------+---------------+---------+-----------+----------+-------------------+ PERO                                                  Not well visualized +---------+---------------+---------+-----------+----------+-------------------+     Summary: RIGHT: - There is no evidence of deep vein thrombosis in the lower extremity. However, portions of this examination were limited- see technologist comments above.  - No cystic structure found in the popliteal fossa.  LEFT: - There is no evidence of deep vein thrombosis in the lower extremity. However, portions of this examination were limited- see technologist comments above.  - No cystic structure found in the popliteal fossa.  *See table(s) above for measurements and observations. Electronically signed by Deitra Mayo MD on 07/16/2022 at 5:35:16 PM.    Final    DG Ankle Complete Left  Result Date: 07/16/2022 CLINICAL DATA:  Ankle pain status fall 2 days ago. Technologist notes best obtainable images. EXAM: LEFT ANKLE COMPLETE - 3+ VIEW COMPARISON:  None Available. FINDINGS: Suboptimal positioning on AP view. There is no evidence of dislocation or joint effusion. Cortical irregularity of the base of the fifth toe metatarsal, seen on oblique  view only. Moderate posterior and small plantar calcaneal enthesophytes. Mild osteoarthritis of the tibiotalar joint. Soft tissues are unremarkable. IMPRESSION: Cortical irregularity noted at the base of the fifth toe metatarsal, seen on oblique view only. Recommend correlation for point tenderness and dedicated left foot radiographs for further evaluation. No acute osseous abnormality of the left ankle. Electronically Signed   By: Ileana Roup M.D.   On: 07/16/2022 16:39   DG Pelvis 1-2 Views  Result Date: 07/16/2022 CLINICAL DATA:  [place] 2 days ago, pain EXAM:  PELVIS - 1-2 VIEW COMPARISON:  07/30/2021 FINDINGS: Frontal view of the pelvis includes both hips. No fracture, subluxation, or dislocation. Joint spaces are well preserved. Soft tissues are unremarkable. IMPRESSION: 1. Unremarkable pelvis. Electronically Signed   By: Randa Ngo M.D.   On: 07/16/2022 15:18   DG Knee Complete 4 Views Left  Result Date: 07/16/2022 CLINICAL DATA:  [place] 2 days ago, pain EXAM: LEFT KNEE - COMPLETE 4+ VIEW COMPARISON:  04/11/2018 FINDINGS: Frontal, bilateral oblique, and cross-table lateral views of the left knee are obtained. No acute fracture, subluxation, or dislocation. 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 No joint effusion. Soft tissues are unremarkable. IMPRESSION: 1. Stable osteoarthritis.  No acute fracture. Electronically Signed   By: Randa Ngo M.D.   On: 07/16/2022 15:17   DG Chest Portable 1 View  Result Date: 07/16/2022 CLINICAL DATA:  [place] 2 days ago, short of breath, COPD EXAM: PORTABLE CHEST 1 VIEW COMPARISON:  05/27/2022 FINDINGS: Single frontal view of the chest demonstrates stable enlargement of the cardiac silhouette. Lung volumes are diminished with crowding the central vasculature. No airspace disease, effusion, or pneumothorax. No acute fracture. IMPRESSION: 1. Enlarged cardiac silhouette. 2. Low lung volumes.  No acute process. Electronically  Signed   By: Randa Ngo M.D.   On: 07/16/2022 15:16    Assessment/Plan Principal Problem:   COPD without acute exacerbation ([place]) -patient is not symptomatic from a COPD perspective, she is on her baseline oxygen saturating well.  Will continue scheduled and as needed inhalers, no indication for steroids. - Observation admission -Unclear etiology of her somnolence, could be psychiatric, but will check ABG -Regular diet -Suicide precautions, including sitter -Patient psych involvement, would probably benefit from inpatient psych services Active Problems:   Polysubstance dependence including opioid drug with daily use ([facility])   Cocaine abuse with cocaine-induced mood disorder ([facility])   PTSD (post-traumatic stress disorder)   Substance induced mood disorder ([place])   Polysubstance abuse ([place])   Affective psychosis, bipolar ([place])   Suicidal ideation  DVT prophylaxis: Lovenox   Code Status: FULL   Consults called: Pysch   Admission status: Obs   Time spent: 42 minutes  Sai Moura Neva Seat MD [facility] Pager [phone]  If 7PM-7AM, please contact night-coverage [email or web address] Password [facility]  07/17/2022, 3:41 PM

## 2022-07-17 NOTE — ED Provider Notes (Signed)
Patient presented to the ED for several complaints but also with suicidal ideation.  Patient has known history of chronic lung disease.  Patient was evaluated by psychiatry but they do not feel that she will be able to be medically managed in a psychiatry facility with her chronic lung disease.  Patient does have borderline O2 sats on her nasal cannula oxygen.  She does have evidence of chronic CO2 retention.  I will consult the medical service.   Dorie Rank, MD 07/17/22 1438

## 2022-07-17 NOTE — Progress Notes (Signed)
07/17/2022  1715 Patient refuses to answer questions even with me speaking louder she just closes her eyes.

## 2022-07-17 NOTE — Progress Notes (Addendum)
Pt has been accepted to the medical floor. This CSW will now remove pt from the [facility] shift report. Inpatient psych consult team will assist with psych needs and TOC will assist with any discharge needs.     Benjaman Kindler, MSW, [facility] 07/17/2022 11:45 PM

## 2022-07-17 NOTE — Progress Notes (Signed)
PT Cancellation Note  Patient Details Name: Sarah Cervantes MRN: [id number] DOB: 1981-03-27   Cancelled Treatment:    Reason Eval/Treat Not Completed: Other (comment) Pt with limited engagement with therapy (keeping eyes closed, minimal verbal response).  Pt reports she just wants to rest.  Had severe pain with light touch to L LE.  Pt declined therapy. Will f/u later as able when pt willing to work with therapy. Abran Richard, PT Acute Rehab [facility] [phone]   Karlton Lemon 07/17/2022, 12:30 PM

## 2022-07-17 NOTE — Consult Note (Signed)
  Attempt to evaluate patient failed twice as she is not agreeing to speak with provider.  She sleeps off mid sentence.  Will try later.

## 2022-07-17 NOTE — ED Notes (Signed)
PT attempting to work with patient. Patient falling asleep while conversating and states her legs are hurting too much to ambulate.

## 2022-07-18 DIAGNOSIS — R45851 Suicidal ideations: Secondary | ICD-10-CM | POA: Diagnosis not present

## 2022-07-18 DIAGNOSIS — F1414 Cocaine abuse with cocaine-induced mood disorder: Secondary | ICD-10-CM | POA: Diagnosis not present

## 2022-07-18 DIAGNOSIS — F3189 Other bipolar disorder: Secondary | ICD-10-CM | POA: Diagnosis not present

## 2022-07-18 DIAGNOSIS — Z79899 Other long term (current) drug therapy: Secondary | ICD-10-CM | POA: Diagnosis not present

## 2022-07-18 DIAGNOSIS — Z87891 Personal history of nicotine dependence: Secondary | ICD-10-CM | POA: Diagnosis not present

## 2022-07-18 DIAGNOSIS — Z1152 Encounter for screening for COVID-19: Secondary | ICD-10-CM | POA: Diagnosis not present

## 2022-07-18 DIAGNOSIS — J441 Chronic obstructive pulmonary disease with (acute) exacerbation: Secondary | ICD-10-CM | POA: Diagnosis not present

## 2022-07-18 DIAGNOSIS — F1910 Other psychoactive substance abuse, uncomplicated: Secondary | ICD-10-CM | POA: Diagnosis not present

## 2022-07-18 MED ORDER — FUROSEMIDE 40 MG PO TABS
40.0000 mg | ORAL_TABLET | Freq: Every day | ORAL | Status: DC
  Administered 2022-07-18 – 2022-07-26 (×9): 40 mg via ORAL
  Filled 2022-07-18 (×9): qty 1

## 2022-07-18 MED ORDER — PRAZOSIN HCL 1 MG PO CAPS
1.0000 mg | ORAL_CAPSULE | Freq: Every day | ORAL | Status: DC
  Administered 2022-07-18 – 2022-07-25 (×8): 1 mg via ORAL
  Filled 2022-07-18 (×8): qty 1

## 2022-07-18 MED ORDER — PANTOPRAZOLE SODIUM 40 MG PO TBEC
40.0000 mg | DELAYED_RELEASE_TABLET | Freq: Every day | ORAL | Status: DC
  Administered 2022-07-18 – 2022-07-26 (×9): 40 mg via ORAL
  Filled 2022-07-18 (×9): qty 1

## 2022-07-18 NOTE — Progress Notes (Signed)
OT Cancellation Note  Patient Details Name: Sarah Cervantes MRN: [id number] DOB: [birth year]   Cancelled Treatment:    Reason Eval/Treat Not Completed: Patient declined, no reason specified: OT going to floor to meet PT this PM for co-Evaluation. PT contacted this OT to inform that patient refused therapy due to feeling tired. Pt was missed this AM due to sleeping. Will continue efforts but will likely discharge OT services if pt continues to refuse.   Julien Girt 07/18/2022, 1:27 PM
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
## 2022-07-18 NOTE — Progress Notes (Signed)
Patient seen and examined.  She complains of chronic leg pain which is likely secondary to neuropathy because she complains of extreme tenderness even with gentle touch.  Patient is sleepy but arousable.  I suspect that she likely has sleep apnea.  She said that she is supposed to use 3 L of oxygen at home due to COPD but does not use it.  Currently she is on 3 to 4 L of oxygen.  Patient is medically stable for discharge/transfer to inpatient psychiatric unit.  I have made psychiatry aware as well.

## 2022-07-18 NOTE — Discharge Summary (Signed)
Physician Discharge Summary  Sarah Cervantes [id number] DOB: [birth year] DOA: 07/16/2022  PCP: Patient, No Pcp Per  Admit date: 07/16/2022 Discharge date: 07/18/2022    Admitted From: Home Disposition: Behavioral health unit  Recommendations for Outpatient Follow-up:  Follow up with PCP in 1-2 weeks Please obtain BMP/CBC in one week Please follow up with your PCP on the following pending results: Unresulted Labs (From admission, onward)     Start     Ordered   07/24/22 0500  Creatinine, serum  (enoxaparin (LOVENOX)    CrCl >/= 30 ml/min)  Weekly,   R     Comments: while on enoxaparin therapy    07/17/22 [place]: None Equipment/Devices: None  Discharge Condition: Stable CODE STATUS: Full code Diet recommendation: Cardiac  Subjective: Patient seen and examined. She complains of chronic leg pain which is likely secondary to neuropathy because she complains of extreme tenderness even with gentle touch. Patient is sleepy but arousable. I suspect that she likely has sleep apnea. She said that she is supposed to use 3 L of oxygen at home due to COPD but does not use it. Currently she is on 3 to 4 L of oxygen. Patient is medically stable for discharge/transfer to inpatient psychiatric unit. I have made psychiatry aware as well.   Brief/Interim Summary: Patient was admitted under hospitalist service basically for suicidal ideation.  She has COPD which is stable.  She is supposed to use 3 L of oxygen at home which she does not use and currently she is requiring 3 to 4 L oxygen so she is at her baseline.  Rest of the medical issues are stable as well and she is medically stable and cleared for discharge to behavioral health/psychiatry whenever bed is available.  We will continue safety sitter in place until she is discharged to behavioral unit.  Discharge Diagnoses:  Principal Problem:   COPD with acute exacerbation ([place]) Active Problems:   Polysubstance  dependence including opioid drug with daily use ([place])   Cocaine abuse with cocaine-induced mood disorder ([facility])   PTSD (post-traumatic stress disorder)   Substance induced mood disorder ([place])   Polysubstance abuse ([place])   Affective psychosis, bipolar ([place])   Suicidal ideation    Discharge Instructions   Allergies as of 07/18/2022       Reactions   Latex Swelling, Rash, Other (See Comments)   Hands swell        Medication List     STOP taking these medications    ibuprofen 200 MG tablet Commonly known as: ADVIL       TAKE these medications    acetaminophen 325 MG tablet Commonly known as: TYLENOL Take 2 tablets (650 mg total) by mouth every 6 (six) hours as needed for mild pain (or Fever >/= 101).   albuterol 108 (90 Base) MCG/ACT inhaler Commonly known as: VENTOLIN HFA Inhale 2 puffs into the lungs every 6 (six) hours as needed for wheezing or shortness of breath.   amLODipine 5 MG tablet Commonly known as: NORVASC Take 1 tablet (5 mg total) by mouth daily.   ARIPiprazole 10 MG tablet Commonly known as: ABILIFY Take 1 tablet (10 mg total) by mouth daily.   budesonide-formoterol 80-4.5 MCG/ACT inhaler Commonly known as: SYMBICORT 2 puffs daily.   FLUoxetine 40 MG capsule Commonly known as: PROZAC Take 1 capsule (40 mg total) by mouth daily.   furosemide 40 MG tablet Commonly  known as: Lasix Take 1 tablet (40 mg total) by mouth daily.   gabapentin 400 MG capsule Commonly known as: NEURONTIN Take 2 capsules (800 mg total) by mouth 3 (three) times daily.   hydrocortisone 2.5 % rectal cream Commonly known as: ANUSOL-HC Place rectally 3 (three) times daily. What changed:  how much to take when to take this reasons to take this   hydrOXYzine 50 MG tablet Commonly known as: ATARAX Take 1 tablet (50 mg total) by mouth 3 (three) times daily as needed for anxiety.   lidocaine 5 % Commonly known as: LIDODERM Place 1 patch onto the skin daily. Remove  & Discard patch within 12 hours or as directed by MD What changed: how much to take   lisinopril 10 MG tablet Commonly known as: ZESTRIL Take 1 tablet (10 mg total) by mouth in the morning.   nicotine polacrilex 2 MG gum Commonly known as: NICORETTE Take 1 each (2 mg total) by mouth as needed for smoking cessation.   Ozempic (0.25 or 0.5 MG/DOSE) 2 MG/1.5ML Sopn Generic drug: Semaglutide(0.25 or 0.5MG/DOS) Inject 0.5 mg into the skin every Monday.   pantoprazole 40 MG tablet Commonly known as: PROTONIX Take 1 tablet (40 mg total) by mouth daily.   prazosin 1 MG capsule Commonly known as: MINIPRESS Take 1 capsule (1 mg total) by mouth at bedtime.   predniSONE 10 MG tablet Commonly known as: DELTASONE Take 2 tablets (20 mg total) by mouth daily.   senna 8.6 MG Tabs tablet Commonly known as: SENOKOT Take 1 tablet (8.6 mg total) by mouth daily.   traZODone 100 MG tablet Commonly known as: DESYREL Take 1 tablet (100 mg total) by mouth at bedtime.        Follow-up Information     Leandrew Koyanagi, MD In 1 week.   Specialty: Orthopedic Surgery Contact information: [place] [id number] [phone]                Allergies  Allergen Reactions   Latex Swelling, Rash and Other (See Comments)    Hands swell    Consultations: Psychiatry   Procedures/Studies: DG Foot Complete Left  Result Date: 07/16/2022 CLINICAL DATA:  Ankle x-ray EXAM: LEFT FOOT - COMPLETE 3+ VIEW COMPARISON:  None Available. FINDINGS: No fracture or dislocation of mid foot or forefoot. The phalanges are normal. The calcaneus is normal. No soft tissue abnormality. Enthesopathic spurring along the plantar aspect of the calcaneus. No fracture at the base of the fifth metatarsal IMPRESSION: No fracture or dislocation. Electronically Signed   By: Suzy Bouchard M.D.   On: 07/16/2022 17:35   VAS [place] LOWER EXTREMITY VENOUS (DVT) (ONLY [facility] & [facility])  Result Date: 07/16/2022  Lower  Venous DVT Study Patient Name:  Sarah Cervantes  Date of Exam:   07/16/2022 Medical Rec #: [id number]          Accession #:    [id number] Date of Birth: 1980-08-11          Patient Gender: F Patient Age:   42 years Exam [facility]:  [facility] Procedure:      VAS [place] LOWER EXTREMITY VENOUS (DVT) Referring Phys: Vonna Kotyk GEIPLE --------------------------------------------------------------------------------  Indications: Swelling.  Risk Factors: None identified. Limitations: Body habitus, poor ultrasound/tissue interface and poor patient cooperation, patient positioning, patient pain tolerance. Comparison Study: No prior studies. Performing Technologist: Oliver Hum RVT  Examination Guidelines: A complete evaluation includes B-mode imaging, spectral Doppler, color Doppler, and power Doppler as needed  of all accessible portions of each vessel. Bilateral testing is considered an integral part of a complete examination. Limited examinations for reoccurring indications may be performed as noted. The reflux portion of the exam is performed with the patient in reverse Trendelenburg.  +---------+---------------+---------+-----------+----------+-------------------+ RIGHT    CompressibilityPhasicitySpontaneityPropertiesThrombus Aging      +---------+---------------+---------+-----------+----------+-------------------+ CFV      Full           Yes      Yes                                      +---------+---------------+---------+-----------+----------+-------------------+ SFJ      Full                                                             +---------+---------------+---------+-----------+----------+-------------------+ FV Prox  Full                                                             +---------+---------------+---------+-----------+----------+-------------------+ FV Mid   Full                                                              +---------+---------------+---------+-----------+----------+-------------------+ FV Distal               Yes      Yes                                      +---------+---------------+---------+-----------+----------+-------------------+ PFV      Full                                                             +---------+---------------+---------+-----------+----------+-------------------+ POP      Full           Yes      Yes                                      +---------+---------------+---------+-----------+----------+-------------------+ PTV      Full                                                             +---------+---------------+---------+-----------+----------+-------------------+ PERO  Not well visualized +---------+---------------+---------+-----------+----------+-------------------+   +---------+---------------+---------+-----------+----------+-------------------+ LEFT     CompressibilityPhasicitySpontaneityPropertiesThrombus Aging      +---------+---------------+---------+-----------+----------+-------------------+ CFV      Full           Yes      Yes                                      +---------+---------------+---------+-----------+----------+-------------------+ SFJ      Full                                                             +---------+---------------+---------+-----------+----------+-------------------+ FV Prox  Full                                                             +---------+---------------+---------+-----------+----------+-------------------+ FV Mid   Full           Yes      Yes                                      +---------+---------------+---------+-----------+----------+-------------------+ FV Distal               Yes      Yes                                      +---------+---------------+---------+-----------+----------+-------------------+  PFV      Full                                                             +---------+---------------+---------+-----------+----------+-------------------+ POP      Full           Yes      Yes                                      +---------+---------------+---------+-----------+----------+-------------------+ PTV                                                   Not well visualized +---------+---------------+---------+-----------+----------+-------------------+ PERO                                                  Not well visualized +---------+---------------+---------+-----------+----------+-------------------+     Summary: RIGHT: - There is no evidence of deep vein thrombosis in the lower extremity. However, portions of this examination  were limited- see technologist comments above.  - No cystic structure found in the popliteal fossa.  LEFT: - There is no evidence of deep vein thrombosis in the lower extremity. However, portions of this examination were limited- see technologist comments above.  - No cystic structure found in the popliteal fossa.  *See table(s) above for measurements and observations. Electronically signed by Deitra Mayo MD on 07/16/2022 at 5:35:16 PM.    Final    DG Ankle Complete Left  Result Date: 07/16/2022 CLINICAL DATA:  Ankle pain status fall 2 days ago. Technologist notes best obtainable images. EXAM: LEFT ANKLE COMPLETE - 3+ VIEW COMPARISON:  None Available. FINDINGS: Suboptimal positioning on AP view. There is no evidence of dislocation or joint effusion. Cortical irregularity of the base of the fifth toe metatarsal, seen on oblique view only. Moderate posterior and small plantar calcaneal enthesophytes. Mild osteoarthritis of the tibiotalar joint. Soft tissues are unremarkable. IMPRESSION: Cortical irregularity noted at the base of the fifth toe metatarsal, seen on oblique view only. Recommend correlation for point tenderness and dedicated left  foot radiographs for further evaluation. No acute osseous abnormality of the left ankle. Electronically Signed   By: Ileana Roup M.D.   On: 07/16/2022 16:39   DG Pelvis 1-2 Views  Result Date: 07/16/2022 CLINICAL DATA:  [place] 2 days ago, pain EXAM: PELVIS - 1-2 VIEW COMPARISON:  07/30/2021 FINDINGS: Frontal view of the pelvis includes both hips. No fracture, subluxation, or dislocation. Joint spaces are well preserved. Soft tissues are unremarkable. IMPRESSION: 1. Unremarkable pelvis. Electronically Signed   By: Randa Ngo M.D.   On: 07/16/2022 15:18   DG Knee Complete 4 Views Left  Result Date: 07/16/2022 CLINICAL DATA:  [place] 2 days ago, pain EXAM: LEFT KNEE - COMPLETE 4+ VIEW COMPARISON:  04/11/2018 FINDINGS: Frontal, bilateral oblique, and cross-table lateral views of the left knee are obtained. No acute fracture, subluxation, or dislocation. 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 No joint effusion. Soft tissues are unremarkable. IMPRESSION: 1. Stable osteoarthritis.  No acute fracture. Electronically Signed   By: Randa Ngo M.D.   On: 07/16/2022 15:17   DG Chest Portable 1 View  Result Date: 07/16/2022 CLINICAL DATA:  [place] 2 days ago, short of breath, COPD EXAM: PORTABLE CHEST 1 VIEW COMPARISON:  05/27/2022 FINDINGS: Single frontal view of the chest demonstrates stable enlargement of the cardiac silhouette. Lung volumes are diminished with crowding the central vasculature. No airspace disease, effusion, or pneumothorax. No acute fracture. IMPRESSION: 1. Enlarged cardiac silhouette. 2. Low lung volumes.  No acute process. Electronically Signed   By: Randa Ngo M.D.   On: 07/16/2022 15:16     Discharge Exam: Vitals:   07/18/22 0810 07/18/22 1001  BP:  119/67  Pulse:  94  Resp:    Temp:  98.4 F (36.9 C)  SpO2: 93% 90%   Vitals:   07/18/22 0116 07/18/22 0514 07/18/22 0810 07/18/22 1001  BP: (!) 107/58 119/73  119/67  Pulse: 91 88  94   Resp: 18 18    Temp: 98.3 F (36.8 C) 98.5 F (36.9 C)  98.4 F (36.9 C)  TempSrc: Oral Oral  Oral  SpO2: 92% 92% 93% 90%  Weight:      Height:        General: Pt is alert, awake, not in acute distress Cardiovascular: RRR, S1/S2 +, no rubs, no gallops Respiratory: CTA bilaterally, no wheezing, no rhonchi Abdominal: Soft, NT, ND, bowel sounds + Extremities: no  edema, no cyanosis    The results of significant diagnostics from this hospitalization (including imaging, microbiology, ancillary and laboratory) are listed below for reference.     Microbiology: Recent Results (from the past 240 hour(s))  Blood Culture (routine x 2)     Status: None (Preliminary result)   Collection Time: 07/16/22  1:20 PM   Specimen: BLOOD  Result Value Ref Range Status   Specimen Description   Final    BLOOD RIGHT ANTECUBITAL Performed at [facility] [address], [place], [place] [address]    Special Requests   Final    BOTTLES DRAWN AEROBIC AND ANAEROBIC Blood Culture adequate volume Performed at [facility] [address], [place], [place] [address]    Culture   Final    NO GROWTH 2 DAYS Performed at [facility] [address] [place], [place] [address]    Report Status PENDING  Incomplete  Blood Culture (routine x 2)     Status: None (Preliminary result)   Collection Time: 07/16/22  1:25 PM   Specimen: BLOOD  Result Value Ref Range Status   Specimen Description   Final    BLOOD SITE NOT SPECIFIED Performed at [facility] [address] [place], [place] [address]    Special Requests   Final    BOTTLES DRAWN AEROBIC AND ANAEROBIC Blood Culture adequate volume Performed at [facility] [address], [place], [place] [address]    Culture   Final    NO GROWTH 2 DAYS Performed at [facility] [address], [place], [place] [address]    Report Status PENDING  Incomplete  Resp  panel by RT-PCR (RSV, Flu A&B, Covid) Anterior Nasal Swab     Status: None   Collection Time: 07/16/22  7:16 PM   Specimen: Anterior Nasal Swab  Result Value Ref Range Status   SARS Coronavirus 2 by RT PCR NEGATIVE NEGATIVE Final    Comment: (NOTE) SARS-CoV-2 target nucleic acids are NOT DETECTED.  The SARS-CoV-2 RNA is generally detectable in upper respiratory specimens during the acute phase of infection. The lowest concentration of SARS-CoV-2 viral copies this assay can detect is 138 copies/mL. A negative result does not preclude SARS-Cov-2 infection and should not be used as the sole basis for treatment or other patient management decisions. A negative result may occur with  improper specimen collection/handling, submission of specimen other than nasopharyngeal swab, presence of viral mutation(s) within the areas targeted by this assay, and inadequate number of viral copies(<138 copies/mL). A negative result must be combined with clinical observations, patient history, and epidemiological information. The expected result is Negative.  Fact Sheet for Patients:  EntrepreneurPulse.com.au  Fact Sheet for Healthcare Providers:  IncredibleEmployment.be  This test is no t yet approved or cleared by the [place] FDA and  has been authorized for detection and/or diagnosis of SARS-CoV-2 by FDA under an Emergency Use Authorization (EUA). This EUA will remain  in effect (meaning this test can be used) for the duration of the COVID-19 declaration under Section 564(b)(1) of the Act, 21 U.S.C.section 360bbb-3(b)(1), unless the authorization is terminated  or revoked sooner.       Influenza A by PCR NEGATIVE NEGATIVE Final   Influenza B by PCR NEGATIVE NEGATIVE Final    Comment: (NOTE) The Xpert Xpress SARS-CoV-2/FLU/RSV plus assay is intended as an aid in the diagnosis of influenza from Nasopharyngeal swab specimens and should not be used  as a  sole basis for treatment. Nasal washings and aspirates are unacceptable for Xpert Xpress SARS-CoV-2/FLU/RSV testing.  Fact Sheet for Patients: EntrepreneurPulse.com.au  Fact Sheet for Healthcare Providers: IncredibleEmployment.be  This test is not yet approved or cleared by the [place] FDA and has been authorized for detection and/or diagnosis of SARS-CoV-2 by FDA under an Emergency Use Authorization (EUA). This EUA will remain in effect (meaning this test can be used) for the duration of the COVID-19 declaration under Section 564(b)(1) of the Act, 21 U.S.C. section 360bbb-3(b)(1), unless the authorization is terminated or revoked.     Resp Syncytial Virus by PCR NEGATIVE NEGATIVE Final    Comment: (NOTE) Fact Sheet for Patients: EntrepreneurPulse.com.au  Fact Sheet for Healthcare Providers: IncredibleEmployment.be  This test is not yet approved or cleared by the [place] FDA and has been authorized for detection and/or diagnosis of SARS-CoV-2 by FDA under an Emergency Use Authorization (EUA). This EUA will remain in effect (meaning this test can be used) for the duration of the COVID-19 declaration under Section 564(b)(1) of the Act, 21 U.S.C. section 360bbb-3(b)(1), unless the authorization is terminated or revoked.  Performed at [facility], [facility] [address], [place], [place] [address]      Labs: BNP (last 3 results) Recent Labs    05/27/22 2130  BNP [id number]*   Basic Metabolic Panel: Recent Labs  Lab 07/16/22 1320  NA 139  K 4.3  CL 103  CO2 27  GLUCOSE 103*  BUN 9  CREATININE 0.87  CALCIUM 8.8*   Liver Function Tests: Recent Labs  Lab 07/16/22 1320  AST 17  ALT 14  ALKPHOS 64  BILITOT 0.4  PROT 7.7  ALBUMIN 3.6   No results for input(s): "LIPASE", "AMYLASE" in the last 168 hours. No results for input(s): "AMMONIA" in the last 168  hours. CBC: Recent Labs  Lab 07/16/22 1320  WBC 8.2  NEUTROABS 5.1  HGB 16.8*  HCT 54.1*  MCV 93.8  PLT 392   Cardiac Enzymes: No results for input(s): "CKTOTAL", "CKMB", "CKMBINDEX", "TROPONINI" in the last 168 hours. BNP: Invalid input(s): "POCBNP" CBG: No results for input(s): "GLUCAP" in the last 168 hours. D-Dimer No results for input(s): "DDIMER" in the last 72 hours. Hgb A1c No results for input(s): "HGBA1C" in the last 72 hours. Lipid Profile No results for input(s): "CHOL", "HDL", "LDLCALC", "TRIG", "CHOLHDL", "LDLDIRECT" in the last 72 hours. Thyroid function studies No results for input(s): "TSH", "T4TOTAL", "T3FREE", "THYROIDAB" in the last 72 hours.  Invalid input(s): "FREET3" Anemia work up No results for input(s): "VITAMINB12", "FOLATE", "FERRITIN", "TIBC", "IRON", "RETICCTPCT" in the last 72 hours. Urinalysis    Component Value Date/Time   COLORURINE YELLOW 03/14/2022 1501   APPEARANCEUR CLEAR 03/14/2022 1501   LABSPEC 1.027 03/14/2022 1501   PHURINE 5.0 03/14/2022 1501   GLUCOSEU NEGATIVE 03/14/2022 1501   HGBUR NEGATIVE 03/14/2022 1501   BILIRUBINUR NEGATIVE 03/14/2022 1501   KETONESUR NEGATIVE 03/14/2022 1501   PROTEINUR NEGATIVE 03/14/2022 1501   NITRITE NEGATIVE 03/14/2022 1501   LEUKOCYTESUR SMALL (A) 03/14/2022 1501   Sepsis Labs Recent Labs  Lab 07/16/22 1320  WBC 8.2   Microbiology Recent Results (from the past 240 hour(s))  Blood Culture (routine x 2)     Status: None (Preliminary result)   Collection Time: 07/16/22  1:20 PM   Specimen: BLOOD  Result Value Ref Range Status   Specimen Description   Final    BLOOD RIGHT ANTECUBITAL Performed at [facility], [place] [address], [place], [place] [address]    Special Requests   Final    BOTTLES DRAWN AEROBIC AND ANAEROBIC Blood Culture adequate volume Performed at [facility] [address] [place], [place] [address]    Culture   Final    NO  GROWTH 2 DAYS Performed at [facility] [address] [place], [place] [address]    Report Status PENDING  Incomplete  Blood Culture (routine x 2)     Status: None (Preliminary result)   Collection Time: 07/16/22  1:25 PM   Specimen: BLOOD  Result Value Ref Range Status   Specimen Description   Final    BLOOD SITE NOT SPECIFIED Performed at [facility] [address] [place], [place] [address]    Special Requests   Final    BOTTLES DRAWN AEROBIC AND ANAEROBIC Blood Culture adequate volume Performed at [facility] [address] [place], [place] [address]    Culture   Final    NO GROWTH 2 DAYS Performed at [facility] [address] [place], [place] [address]    Report Status PENDING  Incomplete  Resp panel by RT-PCR (RSV, Flu A&B, Covid) Anterior Nasal Swab     Status: None   Collection Time: 07/16/22  7:16 PM   Specimen: Anterior Nasal Swab  Result Value Ref Range Status   SARS Coronavirus 2 by RT PCR NEGATIVE NEGATIVE Final    Comment: (NOTE) SARS-CoV-2 target nucleic acids are NOT DETECTED.  The SARS-CoV-2 RNA is generally detectable in upper respiratory specimens during the acute phase of infection. The lowest concentration of SARS-CoV-2 viral copies this assay can detect is 138 copies/mL. A negative result does not preclude SARS-Cov-2 infection and should not be used as the sole basis for treatment or other patient management decisions. A negative result may occur with  improper specimen collection/handling, submission of specimen other than nasopharyngeal swab, presence of viral mutation(s) within the areas targeted by this assay, and inadequate number of viral copies(<138 copies/mL). A negative result must be combined with clinical observations, patient history, and epidemiological information. The expected result is Negative.  Fact Sheet for Patients:  EntrepreneurPulse.com.au  Fact  Sheet for Healthcare Providers:  IncredibleEmployment.be  This test is no t yet approved or cleared by the [place] FDA and  has been authorized for detection and/or diagnosis of SARS-CoV-2 by FDA under an Emergency Use Authorization (EUA). This EUA will remain  in effect (meaning this test can be used) for the duration of the COVID-19 declaration under Section 564(b)(1) of the Act, 21 U.S.C.section 360bbb-3(b)(1), unless the authorization is terminated  or revoked sooner.       Influenza A by PCR NEGATIVE NEGATIVE Final   Influenza B by PCR NEGATIVE NEGATIVE Final    Comment: (NOTE) The Xpert Xpress SARS-CoV-2/FLU/RSV plus assay is intended as an aid in the diagnosis of influenza from Nasopharyngeal swab specimens and should not be used as a sole basis for treatment. Nasal washings and aspirates are unacceptable for Xpert Xpress SARS-CoV-2/FLU/RSV testing.  Fact Sheet for Patients: EntrepreneurPulse.com.au  Fact Sheet for Healthcare Providers: IncredibleEmployment.be  This test is not yet approved or cleared by the [place] FDA and has been authorized for detection and/or diagnosis of SARS-CoV-2 by FDA under an Emergency Use Authorization (EUA). This EUA will remain in effect (meaning this test can be used) for the duration of the COVID-19 declaration under Section 564(b)(1) of the  Act, 21 U.S.C. section 360bbb-3(b)(1), unless the authorization is terminated or revoked.     Resp Syncytial Virus by PCR NEGATIVE NEGATIVE Final    Comment: (NOTE) Fact Sheet for Patients: [employer]  Fact Sheet for Healthcare Providers: IncredibleEmployment.be  This test is not yet approved or cleared by the [place] FDA and has been authorized for detection and/or diagnosis of SARS-CoV-2 by FDA under an Emergency Use Authorization (EUA). This EUA will remain in effect  (meaning this test can be used) for the duration of the COVID-19 declaration under Section 564(b)(1) of the Act, 21 U.S.C. section 360bbb-3(b)(1), unless the authorization is terminated or revoked.  Performed at [facility], [place] [address], [place], [place] [address]      Time coordinating discharge: Over 30 minutes  SIGNED:   Darliss Cheney, MD  [facility] 07/18/2022, 10:21 AM *Please note that this is a verbal dictation therefore any spelling or grammatical errors are due to the "Dix One" system interpretation. If 7PM-7AM, please contact night-coverage [email or web address]

## 2022-07-18 NOTE — Progress Notes (Signed)
PT Cancellation Note  Patient Details Name: Sarah Cervantes MRN: [id number] DOB: 1980-12-12   Cancelled Treatment:    Reason Eval/Treat Not Completed: Patient declined, no reason specified Pt resting with eyes closed.  She again declined PT stating "I don't want to, I'll do it tomorrow." Educated on importance of mobility to maintain strength, pulmonary hygiene/prevent PNE as she is already on 4 L O2, and prevent blood clots. Even encouraged just dangling EOB or to chair. Pt keeps eyes closed and states "I don't want to, maybe tomorrow. Abran Richard, [facility] 07/18/2022, 1:26 PM

## 2022-07-18 NOTE — TOC Progression Note (Signed)
Transition of Care [facility]) - Progression Note    Patient Details  Name: Lochlynn Kusek MRN: [id number] Date of Birth: [birth year]  Transition [facility] Care [facility]) CM/SW [place], LCSW Phone Number: 07/18/2022, 1:44 PM  Clinical Narrative:    Received call from [place] w/ [facility] who have declined pt for psych admission due to O2 requirement.    Expected Discharge Plan: Psychiatric Hospital Barriers to Discharge: Psych Bed not available  Expected Discharge Plan and Services In-house Referral: Clinical Social Work Discharge Planning Services: CM Consult Post Acute Care Choice: NA Living arrangements for the past 2 months: Hotel/Motel Expected Discharge Date: 07/18/22               DME Arranged: N/A DME Agency: NA                   Social Determinants of Health ([place]) Interventions SDOH Screenings   Food Insecurity: Unknown (03/31/2022)  Recent Concern: [place] Present (03/15/2022)  Housing: High Risk (03/15/2022)  Transportation Needs: Unknown (03/31/2022)  Recent Concern: Transportation Needs - Unmet Transportation Needs (03/15/2022)  Utilities: Unknown (03/31/2022)  Recent Concern: Utilities - At Risk (03/15/2022)  Alcohol Screen: Medium Risk (04/10/2018)  Tobacco Use: Medium Risk (05/27/2022)    Readmission Risk Interventions     No data to display

## 2022-07-18 NOTE — TOC Initial Note (Signed)
Transition of Care [facility]) - Initial/Assessment Note    Patient Details  Name: Sarah Cervantes MRN: [id number] Date of Birth: 20-Feb-1981  Transition of Care [facility]) CM/SW Contact:    Vassie Moselle, LCSW Phone Number: 07/18/2022, 10:59 AM  Clinical Narrative:                 Patient meets criteria for inpatient psychiatric treatment per Sheran Fava, NP. No appropriate beds at [facility] or [facility] currently. Pt is likely to be difficult to place due to need for O2. CSW faxed referrals to the following facilities for review:    [facility]   [facility]   [facility]   [facility]   [facility] [place] will continue to seek appropriate placement for pt.          Expected Discharge Plan: Psychiatric Hospital Barriers to Discharge: Psych Bed not available   Patient Goals and CMS Choice Patient states their goals for this hospitalization and ongoing recovery are:: To go home CMS Medicare.gov Compare Post Acute Care list provided to:: Patient Choice offered to / list presented to : Patient      Expected Discharge Plan and Services In-house Referral: Clinical Social Work Discharge Planning Services: CM Consult Post Acute Care Choice: NA Living arrangements for the past 2 months: Hotel/Motel Expected  Discharge Date: 07/18/22               DME Arranged: N/A DME Agency: NA                  Prior Living Arrangements/Services Living arrangements for the past 2 months: Hotel/Motel Lives with:: Self Patient language and need for interpreter reviewed:: Yes Do you feel safe going back to the place where you live?: Yes      Need for Family Participation in Patient Care: No (Comment) Care giver support system in place?: No (comment) Current home services: DME (Home O2- 2L) Criminal Activity/Legal Involvement Pertinent to Current Situation/Hospitalization: No - Comment as needed  Activities of Daily Living      Permission Sought/Granted   Permission granted to share information with : No              Emotional Assessment   Attitude/Demeanor/Rapport: Unable to Assess Affect (typically observed): Unable to Assess Orientation: : Oriented to Self, Oriented to Place, Oriented to  Time, Oriented to Situation Alcohol / Substance Use: Illicit Drugs, Tobacco Use (Cocaine) Psych Involvement: Yes (comment)  Admission diagnosis:  Suicidal ideation [R45.851] Pain of left lower extremity [M79.605] Chronic obstructive pulmonary disease, unspecified COPD type (HCC) [J44.9] Patient Active Problem List   Diagnosis Date Noted   COPD with acute exacerbation ([place]) 07/17/2022   Cocaine use disorder ([place]) 05/26/2022   Bipolar I disorder, most recent episode depressed ([place]) 05/26/2022   Suicidal ideation 05/26/2022   Homelessness 05/26/2022   Insomnia 04/03/2022   Anxiety state 04/03/2022   Affective  psychosis, bipolar ([place]) 04/02/2022   Bipolar disorder, curr episode depressed, severe, w/psychotic features ([place]) 04/01/2022   Polysubstance abuse ([place]) 04/01/2022   MDD (major depressive disorder), recurrent episode, severe ([place]) 03/31/2022   Acute respiratory failure with hypoxia ([place]) 03/16/2022   Volume overload 03/16/2022   History of COVID-19 03/16/2022   Near syncope 03/14/2022    Closed fracture of T10 vertebra ([place]) 03/14/2022   Substance induced mood disorder ([place]) 07/16/2021   Constipation 04/13/2018   Bacterial vaginosis 04/13/2018   Bacteremia due to Gram-negative bacteria 03/25/2018   Cocaine abuse with cocaine-induced mood disorder ([place]) 03/24/2018   PTSD (post-traumatic stress disorder) 03/24/2018   Sepsis, Gram negative ([place]) 03/23/2018   Alcohol abuse with intoxication ([place]) 03/23/2018   Polysubstance dependence including opioid drug with daily use ([place]) 03/23/2018   PCP:  Patient, No Pcp Per Pharmacy:   [facility] [place] [address] Phone: [phone] Fax: [phone]     Social Determinants of Health (SDOH) Social History: SDOH Screenings   Food Insecurity: Unknown (03/31/2022)  Recent Concern: Food Insecurity - Food Insecurity Present (03/15/2022)  Housing: High Risk (03/15/2022)  Transportation Needs: Unknown (03/31/2022)  Recent Concern: Transportation Needs - Unmet Transportation Needs (03/15/2022)  Utilities: Unknown (03/31/2022)  Recent Concern: Utilities - At Risk (03/15/2022)  Alcohol Screen: Medium Risk (04/10/2018)  Tobacco Use: Medium Risk (05/27/2022)   SDOH Interventions:     Readmission Risk Interventions     No data to display

## 2022-07-18 NOTE — Evaluation (Signed)
Physical Therapy Evaluation Patient Details Name: Sarah Cervantes MRN: [id number] DOB: [birth year] Today's Date: 07/18/2022  History of Present Illness  Pt is 42 yo female admitted 07/16/22 with lower extremity pain and swelling after a fall.  Xrays of pelvis, L foot, ankle, knee all negative for acute fracture or dislocation.  Per ED note, cannot rule our ligamentous injury, placed pt in [place], and recommended outpatient ortho. Pt also reported suicidal ideation and now with TTS recommending inpatient psych at d/c.  Pt with history including but not limited to cocaine, heroin, and ETOH abuse, COPD and supposed to be on 2 L [place] but noncompliant, bipolar.  Clinical Impression  Pt admitted with above diagnosis. At baseline pt is independent.  Pt agreeable to PT as she was needing to get up to use bathroom and change bed linens.  Pt's foot pain had improved to her baseline level of pain from neuropathy, but L knee pain was still severe.  Pt had planned to walk to bathroom but knee pain so severe she was unable to bear weight even with KI in place.  Pt was only able to pivot to [facility] and back to bed.  Knee was painful to palpation at posterior knee and patella.  Joint mobilization seemed equal bil R to L , but L anterior glide and L lateral glide were very painful.  Hopeful that pt can progress as pain improve, but at this time she is unable to ambulate or transfer without assist and from a mobility perspective would need SNF level of care (noted plan for inpatient psych at d/c). Notified MD that pt unable to weight bear. Pt currently with functional limitations due to the deficits listed below (see PT Problem List). Pt will benefit from skilled PT to increase their independence and safety with mobility to allow discharge to the venue listed below.          Recommendations for follow up therapy are one component of a multi-disciplinary discharge planning process, led by the attending physician.  Recommendations may  be updated based on patient status, additional functional criteria and insurance authorization.  Follow Up Recommendations Skilled nursing-short term rehab (<3 hours/day) Can patient physically be transported by private vehicle: No    Assistance Recommended at Discharge Frequent or constant Supervision/Assistance  Patient can return home with the following  A lot of help with walking and/or transfers;A lot of help with bathing/dressing/bathroom;Assistance with cooking/housework;Help with stairs or ramp for entrance    Equipment Recommendations Rolling walker (2 wheels);Wheelchair cushion (measurements PT);Wheelchair (measurements PT);BSC/3in1  Recommendations for Other Services       Functional Status Assessment Patient has had a recent decline in their functional status and demonstrates the ability to make significant improvements in function in a reasonable and predictable amount of time.     Precautions / Restrictions Precautions Precautions: Fall Required Braces or Orthoses: Other Brace;Knee Immobilizer - Left Knee Immobilizer - Left: On when out of bed or walking Other Brace: No orders but KI was placed in ED. Restrictions Weight Bearing Restrictions: No      Mobility  Bed Mobility Overal bed mobility: Needs Assistance Bed Mobility: Supine to Sit, Sit to Supine     Supine to sit: Min assist Sit to supine: Min assist        Transfers Overall transfer level: Needs assistance Equipment used: Rolling walker (2 wheels) Transfers: Sit to/from Stand, Bed to chair/wheelchair/BSC Sit to Stand: Min assist Stand pivot transfers: Mod assist  General transfer comment: Sit to stand from bed and BSC with heavy use of UE and R LE.  Pt had intended to walk to restroom but was unable to weight bear on L LE even with KI so performed pivot to [facility] with cues and mod A to stabilize and keep RW close.  PIvoted back to EOB after using BSC.  Required assist for ADLs     Ambulation/Gait               General Gait Details: unable  Stairs            Wheelchair Mobility    Modified Rankin (Stroke Patients Only)       Balance Overall balance assessment: Needs assistance Sitting-balance support: No upper extremity supported Sitting balance-Leahy Scale: Normal     Standing balance support: Bilateral upper extremity supported Standing balance-Leahy Scale: Poor Standing balance comment: requiring RW and min-mod A                             Pertinent Vitals/Pain Pain Assessment Pain Assessment: 0-10 Pain Score: 9  Pain Location: L knee Pain Descriptors / Indicators: Tightness, Sharp Pain Intervention(s): Limited activity within patient's tolerance, Monitored during session, Repositioned    Home Living Family/patient expects to be discharged to:: Shelter/Homeless (pt reports living in hotels)                   Additional Comments: Motel    Prior Function Prior Level of Function : Independent/Modified Independent             Mobility Comments: independent ADLs Comments: independent     Hand Dominance   Dominant Hand: Right    Extremity/Trunk Assessment   Upper Extremity Assessment Upper Extremity Assessment: Overall WFL for tasks assessed    Lower Extremity Assessment Lower Extremity Assessment: LLE deficits/detail;RLE deficits/detail RLE Deficits / Details: ROM WFL; MMT 5/5 LLE Deficits / Details: ROM: functional but knee ROM was painful, able to do 0 to 90 degees at knee.  MMT: 3/5 throughout but not further tested due to pain at knee.  Pain: Pt reports L foot tingling-chronic pain from neuropathy but tolerable.  Reports knee pain at 9/10, unbearable, unable to bear weight. Palpation: Reports painful to touch over patella and posterior knee.  Joint Glides tested patella and tib/fib anterior, posterior, medial, lateral.  All seemed equal to R side except posterior to anterior glide and medial to  lateral of tibia very painful compared to R    Cervical / Trunk Assessment Cervical / Trunk Assessment: Other exceptions Cervical / Trunk Exceptions: obesity  Communication   Communication: No difficulties  Cognition Arousal/Alertness: Awake/alert Behavior During Therapy: WFL for tasks assessed/performed Overall Cognitive Status: Within Functional Limits for tasks assessed                                          General Comments      Exercises     Assessment/Plan    PT Assessment Patient needs continued PT services  PT Problem List Decreased strength;Pain;Decreased range of motion;Decreased activity tolerance;Decreased knowledge of use of DME;Decreased balance;Decreased safety awareness;Decreased mobility;Decreased knowledge of precautions       PT Treatment Interventions DME instruction;Therapeutic exercise;Gait training;Balance training;Wheelchair mobility training;Manual techniques;Stair training;Functional mobility training;Cognitive remediation;Therapeutic activities;Patient/family education;Modalities    PT Goals (Current goals can be  found in the Care Plan section)  Acute Rehab PT Goals Patient Stated Goal: decrease pain PT Goal Formulation: With patient Time For Goal Achievement: 08/01/22 Potential to Achieve Goals: Good    Frequency Min 3X/week     Co-evaluation               AM-PAC PT "6 Clicks" Mobility  Outcome Measure Help needed turning from your back to your side while in a flat bed without using bedrails?: A Little Help needed moving from lying on your back to sitting on the side of a flat bed without using bedrails?: A Little Help needed moving to and from a bed to a chair (including a wheelchair)?: A Lot Help needed standing up from a chair using your arms (e.g., wheelchair or bedside chair)?: A Little Help needed to walk in hospital room?: Total Help needed climbing 3-5 steps with a railing? : Total 6 Click Score: 13     End of Session Equipment Utilized During Treatment: Gait belt;Left knee immobilizer Activity Tolerance: Patient tolerated treatment well Patient left: in bed;with call bell/phone within reach;with nursing/sitter in room Nurse Communication: Mobility status PT Visit Diagnosis: Other abnormalities of gait and mobility (R26.89);Muscle weakness (generalized) (M62.81)    Time: [id number] PT Time Calculation (min) (ACUTE ONLY): 25 min   Charges:   PT Evaluation $PT Eval Moderate Complexity: 1 Mod PT Treatments $Therapeutic Activity: 8-22 mins        Abran Richard, PT Acute Rehab [facility] [phone]   Karlton Lemon 07/18/2022, 4:41 PM

## 2022-07-19 DIAGNOSIS — J441 Chronic obstructive pulmonary disease with (acute) exacerbation: Secondary | ICD-10-CM | POA: Diagnosis not present

## 2022-07-19 DIAGNOSIS — F3189 Other bipolar disorder: Secondary | ICD-10-CM | POA: Diagnosis not present

## 2022-07-19 DIAGNOSIS — F1910 Other psychoactive substance abuse, uncomplicated: Secondary | ICD-10-CM | POA: Diagnosis not present

## 2022-07-19 DIAGNOSIS — F1414 Cocaine abuse with cocaine-induced mood disorder: Secondary | ICD-10-CM | POA: Diagnosis not present

## 2022-07-19 DIAGNOSIS — R45851 Suicidal ideations: Secondary | ICD-10-CM | POA: Diagnosis not present
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
## 2022-07-19 NOTE — Progress Notes (Signed)
PROGRESS NOTE    Sarah Cervantes  [id number] DOB: 29-Sep-1980 DOA: 07/16/2022 PCP: Patient, No Pcp Per   Brief Narrative:  HPI: Sarah Cervantes is a 42 y.o. female with medical history significant for daily cocaine abuse, alcohol abuse, substance abuse related ecchymosis, bipolar 1 disorder, previous suicide attempts by overdose and self-harm, as well as COPD supposed to be on 2 L nasal cannula oxygen chronically who is being admitted to the hospital with complaints of knee pain, hip and back pain after falling 2 days ago.  She was evaluated after this fall in the emergency department, discharged from the hospital as workup was negative.  Today she presented with continuation of discomfort, and also mentioned that she is having suicidal ideation.  She was seen by psychiatry, endorsed suicidal ideation with a plan to overdose on medications.  There is that she has also been engaging in superficial cutting, last about 2 weeks ago.  Patient is at risk for homelessness, currently staying in a motel often stays with friends.  She has a history of COPD, is supposed to be on 2 L nasal cannula oxygen chronically, however has not been using it as she is homeless.  Today on evaluation she was saturating 88% on room air.  She denies any chest pain, cough, significant shortness of breath.  During my evaluation of the patient, she is incredibly somnolent, though after repeated arousal, she is oriented x 4, answering questions appropriately.  However she is not a very good historian, not very specific in her complaints.  As above, patient was seen by psychiatry in the emergency department, they agree the patient may benefit from inpatient psychiatric services given her significant psychiatric history, substance abuse, and suicidal ideation.  However they feel the patient is not medically stable for inpatient psychiatric admission, as such they recommend hospitalist admission for stabilization of her medical  problems.   Assessment & Plan:   Principal Problem:   COPD with acute exacerbation ([place]) Active Problems:   Polysubstance dependence including opioid drug with daily use (HCC)   Cocaine abuse with cocaine-induced mood disorder (HCC)   PTSD (post-traumatic stress disorder)   Substance induced mood disorder (HCC)   Polysubstance abuse (HCC)   Affective psychosis, bipolar ([place])   Suicidal ideation  COPD with chronic hypoxic respiratory failure: Patient complains of shortness of breath but she is not wheezy.  Patient is supposed to use 3 L of oxygen at home but she is noncompliant.  Currently she is requiring 3 to 4 L of oxygen which is her baseline.  Continue bronchodilators, no indication of steroids.  Somnolence: Patient is much better today.  She is able to hold conversation.  I was able to gather more history and she agreed that she has a history of sleep apnea and she does not use CPAP machine either.  I will order CPAP for her while she is in the hospital.  Essential hypertension: Blood pressure controlled.  Continue amlodipine and prazosin.Marland Kitchen  GERD: Continue PPI.  Polysubstance abuse: Positive for cocaine.  Cessation counseling provided.  Suicidal ideation/psychosis/bipolar disorder: Patient has been seen by psychiatry, they have recommended inpatient psychiatry admission however I have been told by psychiatry as well as TOC that due to patient's requirement of oxygen, this is going to be challenging however we continue to climb advanced.  Continue home medications which includes Abilify, Prozac, Neurontin and trazodone.  Continue one-to-one sitter.  Left knee pain/generalized weakness: Patient states that she fell/tripped on her dog before coming here  and has been having left knee pain.  X-ray are negative for any fracture.  Likely has contusion.  Seen by PT OT and they recommend SNF.  Obesity: Weight loss and dietary modification counseling provided.  DVT prophylaxis: SCDs Start:  07/17/22 1540   Code Status: Full Code  Family Communication:  None present at bedside.  Plan of care discussed with patient in length and he/she verbalized understanding and agreed with it.  Status is: Observation The patient will require care spanning > 2 midnights and should be moved to inpatient because: Medically stable.  Needs placement to psychiatry unit.   Estimated body mass index is 47.2 kg/m as calculated from the following:   Height as of this encounter: 5' 4"$  (1.626 m).   Weight as of this encounter: 124.7 kg.    Nutritional Assessment: Body mass index is 47.2 kg/m.Marland Kitchen Seen by dietician.  I agree with the assessment and plan as outlined below: Nutrition Status:        . Skin Assessment: I have examined the patient's skin and I agree with the wound assessment as performed by the wound care RN as outlined below:    Consultants:  Psychiatry  Procedures:  None  Antimicrobials:  Anti-infectives (From admission, onward)    None         Subjective: Patient seen and examined she complains of shortness of breath but appears very comfortable however she is obese and she is sleeping flat on the bed which is likely the cause of her shortness of breath and she has history of sleep apnea as well.  Objective: Vitals:   07/18/22 2100 07/19/22 0414 07/19/22 0748 07/19/22 0753  BP: 119/67 112/74  110/61  Pulse: 92 89  93  Resp: 18 18    Temp: 98.6 F (37 C) 98.4 F (36.9 C)  98.2 F (36.8 C)  TempSrc: Oral Oral  Oral  SpO2: 92% 93% 96% 93%  Weight:      Height:        Intake/Output Summary (Last 24 hours) at 07/19/2022 0957 Last data filed at 07/19/2022 0954 Gross per 24 hour  Intake 826 ml  Output 1100 ml  Net -274 ml   Filed Weights   07/16/22 1310  Weight: 124.7 kg    Examination:  General exam: Appears calm and comfortable, obese Respiratory system: Clear to auscultation. Respiratory effort normal. Cardiovascular system: S1 & S2 heard, RRR. No  JVD, murmurs, rubs, gallops or clicks. No pedal edema. Gastrointestinal system: Abdomen is nondistended, soft and nontender. No organomegaly or masses felt. Normal bowel sounds heard. Central nervous system: Alert and oriented. No focal neurological deficits. Extremities: Symmetric 5 x 5 power. Skin: No rashes, lesions or ulcers.    Data Reviewed: I have personally reviewed following labs and imaging studies  CBC: Recent Labs  Lab 07/16/22 1320  WBC 8.2  NEUTROABS 5.1  HGB 16.8*  HCT 54.1*  MCV 93.8  PLT 0000000   Basic Metabolic Panel: Recent Labs  Lab 07/16/22 1320  NA 139  K 4.3  CL 103  CO2 27  GLUCOSE 103*  BUN 9  CREATININE 0.87  CALCIUM 8.8*   GFR: Estimated Creatinine Clearance: 108.9 mL/min (by C-G formula based on SCr of 0.87 mg/dL). Liver Function Tests: Recent Labs  Lab 07/16/22 1320  AST 17  ALT 14  ALKPHOS 64  BILITOT 0.4  PROT 7.7  ALBUMIN 3.6   No results for input(s): "LIPASE", "AMYLASE" in the last 168 hours. No results for input(s): "  AMMONIA" in the last 168 hours. Coagulation Profile: No results for input(s): "INR", "PROTIME" in the last 168 hours. Cardiac Enzymes: No results for input(s): "CKTOTAL", "CKMB", "CKMBINDEX", "TROPONINI" in the last 168 hours. BNP (last 3 results) No results for input(s): "PROBNP" in the last 8760 hours. HbA1C: No results for input(s): "HGBA1C" in the last 72 hours. CBG: No results for input(s): "GLUCAP" in the last 168 hours. Lipid Profile: No results for input(s): "CHOL", "HDL", "LDLCALC", "TRIG", "CHOLHDL", "LDLDIRECT" in the last 72 hours. Thyroid Function Tests: No results for input(s): "TSH", "T4TOTAL", "FREET4", "T3FREE", "THYROIDAB" in the last 72 hours. Anemia Panel: No results for input(s): "VITAMINB12", "FOLATE", "FERRITIN", "TIBC", "IRON", "RETICCTPCT" in the last 72 hours. Sepsis Labs: Recent Labs  Lab 07/16/22 1320 07/16/22 1520  LATICACIDVEN 1.2 1.1    Recent Results (from the past 240  hour(s))  Blood Culture (routine x 2)     Status: None (Preliminary result)   Collection Time: 07/16/22  1:20 PM   Specimen: BLOOD  Result Value Ref Range Status   Specimen Description   Final    BLOOD RIGHT ANTECUBITAL Performed at [facility] [address] [place], [place] [address]    Special Requests   Final    BOTTLES DRAWN AEROBIC AND ANAEROBIC Blood Culture adequate volume Performed at [facility] [address] [place], [place] [address]    Culture   Final    NO GROWTH 3 DAYS Performed at [facility], [place] [address], [place], [place] [address]    Report Status PENDING  Incomplete  Blood Culture (routine x 2)     Status: None (Preliminary result)   Collection Time: 07/16/22  1:25 PM   Specimen: BLOOD  Result Value Ref Range Status   Specimen Description   Final    BLOOD SITE NOT SPECIFIED Performed at [facility] [address] [place], [place] [address]    Special Requests   Final    BOTTLES DRAWN AEROBIC AND ANAEROBIC Blood Culture adequate volume Performed at [facility] [address], [place], [place] [address]    Culture   Final    NO GROWTH 3 DAYS Performed at [facility], [place] [address], [place], [place] [address]    Report Status PENDING  Incomplete  Resp panel by RT-PCR (RSV, Flu A&B, Covid) Anterior Nasal Swab     Status: None   Collection Time: 07/16/22  7:16 PM   Specimen: Anterior Nasal Swab  Result Value Ref Range Status   SARS Coronavirus 2 by RT PCR NEGATIVE NEGATIVE Final    Comment: (NOTE) SARS-CoV-2 target nucleic acids are NOT DETECTED.  The SARS-CoV-2 RNA is generally detectable in upper respiratory specimens during the acute phase of infection. The lowest concentration of SARS-CoV-2 viral copies this assay can detect is 138 copies/mL. A negative result does not preclude SARS-Cov-2 infection and should not be used as the sole basis for  treatment or other patient management decisions. A negative result may occur with  improper specimen collection/handling, submission of specimen other than nasopharyngeal swab, presence of viral mutation(s) within the areas targeted by this assay, and inadequate number of viral copies(<138 copies/mL). A negative result must be combined with clinical observations, patient history, and epidemiological information. The expected result is Negative.  Fact Sheet for Patients:  EntrepreneurPulse.com.au  Fact Sheet for Healthcare Providers:  IncredibleEmployment.be  This test is no t yet approved or cleared by the [place] FDA  and  has been authorized for detection and/or diagnosis of SARS-CoV-2 by FDA under an Emergency Use Authorization (EUA). This EUA will remain  in effect (meaning this test can be used) for the duration of the COVID-19 declaration under Section 564(b)(1) of the Act, 21 U.S.C.section 360bbb-3(b)(1), unless the authorization is terminated  or revoked sooner.       Influenza A by PCR NEGATIVE NEGATIVE Final   Influenza B by PCR NEGATIVE NEGATIVE Final    Comment: (NOTE) The Xpert Xpress SARS-CoV-2/FLU/RSV plus assay is intended as an aid in the diagnosis of influenza from Nasopharyngeal swab specimens and should not be used as a sole basis for treatment. Nasal washings and aspirates are unacceptable for Xpert Xpress SARS-CoV-2/FLU/RSV testing.  Fact Sheet for Patients: EntrepreneurPulse.com.au  Fact Sheet for Healthcare Providers: IncredibleEmployment.be  This test is not yet approved or cleared by the [place] FDA and has been authorized for detection and/or diagnosis of SARS-CoV-2 by FDA under an Emergency Use Authorization (EUA). This EUA will remain in effect (meaning this test can be used) for the duration of the COVID-19 declaration under Section 564(b)(1) of the Act, 21  U.S.C. section 360bbb-3(b)(1), unless the authorization is terminated or revoked.     Resp Syncytial Virus by PCR NEGATIVE NEGATIVE Final    Comment: (NOTE) Fact Sheet for Patients: EntrepreneurPulse.com.au  Fact Sheet for Healthcare Providers: IncredibleEmployment.be  This test is not yet approved or cleared by the [place] FDA and has been authorized for detection and/or diagnosis of SARS-CoV-2 by FDA under an Emergency Use Authorization (EUA). This EUA will remain in effect (meaning this test can be used) for the duration of the COVID-19 declaration under Section 564(b)(1) of the Act, 21 U.S.C. section 360bbb-3(b)(1), unless the authorization is terminated or revoked.  Performed at [facility], [place] [address], [place], [place] [address]      Radiology Studies: No results found.  Scheduled Meds:  amLODipine  5 mg Oral Daily   ARIPiprazole  10 mg Oral Daily   docusate sodium  100 mg Oral BID   enoxaparin (LOVENOX) injection  60 mg Subcutaneous Q24H   FLUoxetine  40 mg Oral Daily   furosemide  40 mg Oral Daily   gabapentin  800 mg Oral TID   lisinopril  10 mg Oral q AM   mometasone-formoterol  2 puff Inhalation BID   pantoprazole  40 mg Oral Daily   prazosin  1 mg Oral QHS   senna  1 tablet Oral Daily   traZODone  100 mg Oral QHS   Continuous Infusions:   LOS: 0 days   Darliss Cheney, MD [facility]  07/19/2022, 9:57 AM   *Please note that this is a verbal dictation therefore any spelling or grammatical errors are due to the "[place] One" system interpretation.  Please page via [place] and do not message via secure chat for urgent patient care matters. Secure chat can be used for non urgent patient care matters.  How to contact the [facility] Attending or Consulting provider [place] or covering provider during after hours [facility], for this patient?  Check the care team in [facility] and look for a)  attending/consulting TRH provider listed and b) the [facility] team listed. Page or secure chat [phone]. Log into [email or web address] and use [place]'s universal password to access. If you do not have the password, please contact the hospital operator. Locate the [facility] provider you are looking for under [facility] and page to  a number that you can be directly reached. If you still have difficulty reaching the provider, please page the [facility] (Director on Call) for the Hospitalists listed on amion for assistance.

## 2022-07-19 NOTE — Progress Notes (Signed)
OT Cancellation Note  Patient Details Name: Sarah Cervantes MRN: [id number] DOB: 03-Sep-1980   Cancelled Treatment:    Reason Eval/Treat Not Completed: Patient declined, no reason specified Patient was sitting in bed finishing breakfast upon entrance to room. Patient reported she was done with breakfast when asked. Patient, after therapist introduced self,reported " I am going to sleep" and pushed HOB control to lie in semi reclined position with eyes closed. Patient refused to participate in session. When asked about plan for after time was done in hospital, patient reported " I don't care I just want to die". Nurse made aware, sitter already present in room with patient at this time. OT to check back another time as schedule will allow.  [name], MS Acute Rehabilitation Department Office# [phone]   07/19/2022, 9:12 AM

## 2022-07-20 DIAGNOSIS — J441 Chronic obstructive pulmonary disease with (acute) exacerbation: Secondary | ICD-10-CM | POA: Diagnosis not present

## 2022-07-20 MED ORDER — OXYCODONE HCL 5 MG PO TABS: 5.0000 mg | ORAL_TABLET | Freq: Three times a day (TID) | ORAL | Status: DC | PRN

## 2022-07-20 NOTE — Evaluation (Signed)
Occupational Therapy Evaluation Patient Details Name: Sarah Cervantes MRN: [id number] DOB: [birth year] Today's Date: 07/20/2022   History of Present Illness Pt is 42 yo female admitted 07/16/22 with lower extremity pain and swelling after a fall.  Xrays of pelvis, L foot, ankle, knee all negative for acute fracture or dislocation.  Per ED note, cannot rule our ligamentous injury, placed pt in [place], and recommended outpatient ortho. Pt also reported suicidal ideation and now with TTS recommending inpatient psych at d/c.  Pt with history including but not limited to cocaine, heroin, and ETOH abuse, COPD and supposed to be on 2 L [place] but noncompliant, bipolar.   Clinical Impression   Sarah Cervantes is a 42 year old woman who presents with above medical history and left knee pain. ON evaluation she required min assist for LB ADLs but otherwise setup or min guard. She was able to ambulate with walker to bathroom and perform toileting and bathing task seated on toilet. Pain is her most limiting factor. Her o2 sat dipped briefly after ambulation but predominantly maintains at 92%. Patient will benefit from skilled OT services while in hospital to improve deficits and learn compensatory strategies as needed in order to return to PLOF.  Plan is for patient to discharge to Inpatient Psych unit with focus of therapist to get patient at modified independent level.      Recommendations for follow up therapy are one component of a multi-disciplinary discharge planning process, led by the attending physician.  Recommendations may be updated based on patient status, additional functional criteria and insurance authorization.   Follow Up Recommendations  No OT follow up     Assistance Recommended at Discharge Intermittent Supervision/Assistance  Patient can return home with the following A little help with walking and/or transfers;A little help with bathing/dressing/bathroom;Help with stairs or ramp for  entrance;Assist for transportation;Assistance with cooking/housework    Functional Status Assessment  Patient has had a recent decline in their functional status and demonstrates the ability to make significant improvements in function in a reasonable and predictable amount of time.  Equipment Recommendations  None recommended by OT    Recommendations for Other Services       Precautions / Restrictions Precautions Precautions: Fall Required Braces or Orthoses: Other Brace;Knee Immobilizer - Left Knee Immobilizer - Left: On when out of bed or walking Other Brace: No orders but KI was placed in ED. Restrictions Weight Bearing Restrictions: No Other Position/Activity Restrictions: WBAT      Mobility Bed Mobility Overal bed mobility: Needs Assistance Bed Mobility: Supine to Sit, Sit to Supine     Supine to sit: Min assist Sit to supine: Supervision   General bed mobility comments: HHA to lift trunk otherwise pt completed on her own with use of rail    Transfers Overall transfer level: Needs assistance Equipment used: Rolling walker (2 wheels) Transfers: Sit to/from Stand Sit to Stand: Min assist, From elevated surface           General transfer comment: Sit to stand from elevated bed and then from toilet with grab bar.  Min A to rise with cues for L LE managment for pain control. Did not use KI      Balance Overall balance assessment: Mild deficits observed, not formally tested Sitting-balance support: No upper extremity supported Sitting balance-Leahy Scale: Normal     Standing balance support: Bilateral upper extremity supported, Single extremity supported Standing balance-Leahy Scale: Poor Standing balance comment: RW to ambulate, could stand single UE  support for ADLs                           ADL either performed or assessed with clinical judgement   ADL Overall ADL's : Needs assistance/impaired Eating/Feeding: Independent   Grooming: Set  up;Sitting   Upper Body Bathing: Set up;Sitting   Lower Body Bathing: Minimal assistance;Sit to/from stand   Upper Body Dressing : Set up   Lower Body Dressing: Minimal assistance;Sit to/from stand   Toilet Transfer: Agricultural engineer (2 wheels);Grab Environmental education officer and Hygiene: Min guard;Sit to/from stand       Functional mobility during ADLs: Min guard;Rolling walker (2 wheels)       Vision Patient Visual Report: No change from baseline       Perception     Praxis      Pertinent Vitals/Pain Pain Assessment Pain Assessment: Faces Faces Pain Scale: Hurts little more Pain Location: L knee Pain Descriptors / Indicators: Tightness Pain Intervention(s): Monitored during session     Hand Dominance Right   Extremity/Trunk Assessment Upper Extremity Assessment Upper Extremity Assessment: Overall WFL for tasks assessed   Lower Extremity Assessment Lower Extremity Assessment: Defer to PT evaluation   Cervical / Trunk Assessment Cervical / Trunk Exceptions: obesity   Communication Communication Communication: No difficulties   Cognition Arousal/Alertness: Awake/alert Behavior During Therapy: WFL for tasks assessed/performed Overall Cognitive Status: Within Functional Limits for tasks assessed                                       General Comments       Exercises     Shoulder Instructions      Home Living Family/patient expects to be discharged to:: Shelter/Homeless (pt reports living in hotels)                                 Additional Comments: Motel      Prior Functioning/Environment Prior Level of Function : Independent/Modified Independent             Mobility Comments: independent ADLs Comments: independent        OT Problem List: Decreased activity tolerance;Pain;Obesity;Decreased knowledge of use of DME or AE      OT Treatment/Interventions: Self-care/ADL  training;Patient/family education;Therapeutic activities;DME and/or AE instruction    OT Goals(Current goals can be found in the care plan section) Acute Rehab OT Goals Patient Stated Goal: less pain OT Goal Formulation: With patient Time For Goal Achievement: 08/03/22 Potential to Achieve Goals: Good  OT Frequency: Min 2X/week    Co-evaluation   Reason for Co-Treatment: For patient/therapist safety;Complexity of the patient's impairments (multi-system involvement);Other (comment) (pain control, prior visits +2 but made good progress today)          AM-PAC OT "6 Clicks" Daily Activity     Outcome Measure Help from another person eating meals?: None Help from another person taking care of personal grooming?: A Little Help from another person toileting, which includes using toliet, bedpan, or urinal?: A Little Help from another person bathing (including washing, rinsing, drying)?: A Little Help from another person to put on and taking off regular upper body clothing?: A Little Help from another person to put on and taking off regular lower body clothing?: A Little 6 Click Score: 19  End of Session Equipment Utilized During Treatment: Gait belt;Rolling walker (2 wheels) Nurse Communication: Mobility status  Activity Tolerance: Patient tolerated treatment well Patient left: in bed;Other (comment) (left in care of [place], PT)  OT Visit Diagnosis: Pain Pain - Right/Left: Left Pain - part of body: Knee                Time: 1030-1101 OT Time Calculation (min): 31 min Charges:  OT General Charges $OT Visit: 1 Visit OT Evaluation $OT Eval Low Complexity: 1 Low  Gustavo Lah, OTR/L [place]  Office [phone]   Lenward Chancellor 07/20/2022, 12:25 PM

## 2022-07-20 NOTE — Evaluation (Signed)
Physical Therapy Evaluation Patient Details Name: Sarah Cervantes MRN: [id number] DOB: [birth year] Today's Date: 07/20/2022  History of Present Illness  Pt is 42 yo female admitted 07/16/22 with lower extremity pain and swelling after a fall.  Xrays of pelvis, L foot, ankle, knee all negative for acute fracture or dislocation.  Per ED note, cannot rule our ligamentous injury, placed pt in [place], and recommended outpatient ortho. Pt also reported suicidal ideation and now with TTS recommending inpatient psych at d/c.  Pt with history including but not limited to cocaine, heroin, and ETOH abuse, COPD and supposed to be on 2 L [place] but noncompliant, bipolar.  Clinical Impression   Pt making good progress today.  She was able to tolerate weight on L LE with good stability.  Did not require KI to ambulate (good stability, fair pain control).  Ambulated 12'x2 with bariatric RW.  Pt c/o L knee feeling tight - tolerated stretching, soft tissue mobilization, and patellar glides well.  Continue plan of care - still recommend SNF level of care but pt making good progress and if continues can likely update.     Recommendations for follow up therapy are one component of a multi-disciplinary discharge planning process, led by the attending physician.  Recommendations may be updated based on patient status, additional functional criteria and insurance authorization.  Follow Up Recommendations Skilled nursing-short term rehab (<3 hours/day) (but is progressing) Can patient physically be transported by private vehicle: Yes    Assistance Recommended at Discharge Frequent or constant Supervision/Assistance  Patient can return home with the following  Assistance with cooking/housework;Help with stairs or ramp for entrance;A little help with walking and/or transfers;A little help with bathing/dressing/bathroom    Equipment Recommendations Rolling walker (2 wheels);Wheelchair cushion (measurements PT);Wheelchair  (measurements PT);BSC/3in1  Recommendations for Other Services       Functional Status Assessment Patient has had a recent decline in their functional status and demonstrates the ability to make significant improvements in function in a reasonable and predictable amount of time.     Precautions / Restrictions Precautions Precautions: Fall Other Brace: No orders but KI was placed in ED. Restrictions Weight Bearing Restrictions: No Other Position/Activity Restrictions: WBAT      Mobility  Bed Mobility Overal bed mobility: Needs Assistance Bed Mobility: Supine to Sit, Sit to Supine     Supine to sit: Min assist Sit to supine: Supervision   General bed mobility comments: HHA to lift trunk otherwise pt completed on her own with use of rail    Transfers Overall transfer level: Needs assistance Equipment used: Rolling walker (2 wheels) Transfers: Sit to/from Stand Sit to Stand: Min assist, From elevated surface           General transfer comment: Sit to stand from elevated bed and then from toilet with grab bar.  Min A to rise with cues for L LE managment for pain control. Did not use KI    Ambulation/Gait Ambulation/Gait assistance: Min guard Gait Distance (Feet): 12 Feet (12'x2) Assistive device: Rolling walker (2 wheels) (bariatric) Gait Pattern/deviations: Step-to pattern, Decreased stride length, Decreased weight shift to left Gait velocity: decreased     General Gait Details: Observed and plapated L knee in standing with walking - demonstrated good stability and pt able to WB without KI in place.  Ambulated to and from bathroom without KI.  Pt educated on step to pattern with cues for RW proximity for optimal UE use. Fatigued easily but was able to ambulate with min guard  Stairs            Wheelchair Mobility    Modified Rankin (Stroke Patients Only)       Balance Overall balance assessment: Needs assistance Sitting-balance support: No upper  extremity supported Sitting balance-Leahy Scale: Normal     Standing balance support: Bilateral upper extremity supported, Single extremity supported Standing balance-Leahy Scale: Poor Standing balance comment: RW to ambulate, could stand single UE support for ADLs                             Pertinent Vitals/Pain Pain Assessment Pain Assessment: Faces Faces Pain Scale: Hurts little more Pain Location: L knee Pain Descriptors / Indicators: Tightness Pain Intervention(s): Limited activity within patient's tolerance, Monitored during session, Repositioned, Relaxation, Other (comment) (joint mobilization and soft tissue mobilization)    Home Living                          Prior Function                       Hand Dominance        Extremity/Trunk Assessment                Communication      Cognition Arousal/Alertness: Awake/alert Behavior During Therapy: WFL for tasks assessed/performed Overall Cognitive Status: Within Functional Limits for tasks assessed                                          General Comments      Exercises Other Exercises Other Exercises: Sitting L AROM: LAQ and knee flexion x 10; tolerated well Other Exercises: Reports L knee feels tight lateral knee and lateral/distal knee.  Performed soft tissue mobilization IT band from knee to hip - pt tolerated and reports helped to loosen.  Also performed patella medial/lateral glides x 10; Figure 4 with ER stretch 30 sec then assisted with figure 4 stretch bringing toward opposite shoulder 30 sec hold. Pt tolerated all well.   Assessment/Plan    PT Assessment Patient needs continued PT services  PT Problem List Decreased strength;Pain;Decreased range of motion;Decreased activity tolerance;Decreased knowledge of use of DME;Decreased balance;Decreased safety awareness;Decreased mobility;Decreased knowledge of precautions       PT Treatment Interventions  DME instruction;Therapeutic exercise;Gait training;Balance training;Wheelchair mobility training;Manual techniques;Stair training;Functional mobility training;Cognitive remediation;Therapeutic activities;Patient/family education;Modalities    PT Goals (Current goals can be found in the Care Plan section)  Acute Rehab PT Goals Patient Stated Goal: decrease pain    Frequency Min 3X/week     Co-evaluation PT/OT/SLP Co-Evaluation/Treatment: Yes Reason for Co-Treatment: For patient/therapist safety;Complexity of the patient's impairments (multi-system involvement);Other (comment) (pain control, prior visits +2 but made good progress today)           AM-PAC PT "6 Clicks" Mobility  Outcome Measure Help needed turning from your back to your side while in a flat bed without using bedrails?: A Little Help needed moving from lying on your back to sitting on the side of a flat bed without using bedrails?: A Little Help needed moving to and from a bed to a chair (including a wheelchair)?: A Little Help needed standing up from a chair using your arms (e.g., wheelchair or bedside chair)?: A Little Help needed to walk in hospital room?: A  Little Help needed climbing 3-5 steps with a railing? : Total 6 Click Score: 16    End of Session Equipment Utilized During Treatment: Gait belt Activity Tolerance: Patient tolerated treatment well Patient left: in bed;with call bell/phone within reach;with nursing/sitter in room Nurse Communication: Mobility status PT Visit Diagnosis: Other abnormalities of gait and mobility (R26.89);Muscle weakness (generalized) (M62.81)    Time: [id number] PT Time Calculation (min) (ACUTE ONLY): 42 min   Charges:     PT Treatments $Gait Training: 8-22 mins $Therapeutic Exercise: 8-22 mins        Abran Richard, PT Acute Rehab [facility] [phone]   Karlton Lemon 07/20/2022, 11:47 AM
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
## 2022-07-20 NOTE — Progress Notes (Signed)
PROGRESS NOTE    Sarah Cervantes  [id number] DOB: 1980-09-30 DOA: 07/16/2022 PCP: Patient, No Pcp Per   Brief Narrative:  HPI: Sarah Cervantes is a 42 y.o. female with medical history significant for daily cocaine abuse, alcohol abuse, substance abuse related ecchymosis, bipolar 1 disorder, previous suicide attempts by overdose and self-harm, as well as COPD supposed to be on 2 L nasal cannula oxygen chronically who is being admitted to the hospital with complaints of knee pain, hip and back pain after falling 2 days ago.  She was evaluated after this fall in the emergency department, discharged from the hospital as workup was negative.  Today she presented with continuation of discomfort, and also mentioned that she is having suicidal ideation.  She was seen by psychiatry, endorsed suicidal ideation with a plan to overdose on medications.  There is that she has also been engaging in superficial cutting, last about 2 weeks ago.  Patient is at risk for homelessness, currently staying in a motel often stays with friends.  She has a history of COPD, is supposed to be on 2 L nasal cannula oxygen chronically, however has not been using it as she is homeless.  Today on evaluation she was saturating 88% on room air.  She denies any chest pain, cough, significant shortness of breath.  During my evaluation of the patient, she is incredibly somnolent, though after repeated arousal, she is oriented x 4, answering questions appropriately.  However she is not a very good historian, not very specific in her complaints.  As above, patient was seen by psychiatry in the emergency department, they agree the patient may benefit from inpatient psychiatric services given her significant psychiatric history, substance abuse, and suicidal ideation.  However they feel the patient is not medically stable for inpatient psychiatric admission, as such they recommend hospitalist admission for stabilization of her medical  problems.   Assessment & Plan:   Principal Problem:   COPD with acute exacerbation ([place]) Active Problems:   Polysubstance dependence including opioid drug with daily use (HCC)   Cocaine abuse with cocaine-induced mood disorder (HCC)   PTSD (post-traumatic stress disorder)   Substance induced mood disorder (HCC)   Polysubstance abuse (HCC)   Affective psychosis, bipolar ([place])   Suicidal ideation  COPD with chronic hypoxic respiratory failure: Patient complains of shortness of breath but she is not wheezy.  Patient is supposed to use 3 L of oxygen at home but she is noncompliant.  Currently she is requiring 3 to 4 L of oxygen which is her baseline.  Continue bronchodilators, no indication of steroids.  Somnolence/history of sleep apnea: Patient continues to improve every day, we ordered CPAP last night and patient thinks that she had a good sleep and feels much better today.   Essential hypertension: Blood pressure controlled.  Continue amlodipine and prazosin.Marland Kitchen  GERD: Continue PPI.  Polysubstance abuse: Positive for cocaine.  Cessation counseling provided.  Suicidal ideation/psychosis/bipolar disorder: Patient has been seen by psychiatry, they have recommended inpatient psychiatry admission however I have been told by psychiatry as well as TOC that due to patient's requirement of oxygen, this is going to be challenging however we continue to search for one.  Continue home medications which includes Abilify, Prozac, Neurontin and trazodone.  Continue one-to-one sitter.  Left knee pain/generalized weakness: Patient states that she fell/tripped on her dog before coming here and has been having left knee pain.  X-ray are negative for any fracture.  Likely has contusion.  Seen by PT  OT and they recommend SNF.  Obesity: Weight loss and dietary modification counseling provided.  DVT prophylaxis: SCDs Start: 07/17/22 1540   Code Status: Full Code  Family Communication:  None present at  bedside.  Plan of care discussed with patient in length and he/she verbalized understanding and agreed with it.  Status is: Observation The patient will require care spanning > 2 midnights and should be moved to inpatient because: Medically stable.  Needs placement to psychiatry unit.   Estimated body mass index is 47.2 kg/m as calculated from the following:   Height as of this encounter: 5' 4"$  (1.626 m).   Weight as of this encounter: 124.7 kg.    Nutritional Assessment: Body mass index is 47.2 kg/m.Marland Kitchen Seen by dietician.  I agree with the assessment and plan as outlined below: Nutrition Status:        . Skin Assessment: I have examined the patient's skin and I agree with the wound assessment as performed by the wound care RN as outlined below:    Consultants:  Psychiatry  Procedures:  None  Antimicrobials:  Anti-infectives (From admission, onward)    None         Subjective:  Seen and examined.  She complains of left knee pain which she has been having for last several days.  She denied any shortness of breath today.  Objective: Vitals:   07/19/22 1937 07/19/22 1938 07/20/22 0512 07/20/22 0927  BP: 127/67 127/67 (!) 109/54   Pulse: 89 89 90   Resp:  20 20   Temp: 98.2 F (36.8 C) 98.2 F (36.8 C) 98.3 F (36.8 C)   TempSrc: Oral Oral Oral   SpO2: 94% 94% 94% 92%  Weight:      Height:        Intake/Output Summary (Last 24 hours) at 07/20/2022 1012 Last data filed at 07/19/2022 1830 Gross per 24 hour  Intake 1890 ml  Output 600 ml  Net 1290 ml    Filed Weights   07/16/22 1310  Weight: 124.7 kg    Examination:  General exam: Appears calm and comfortable, obese Respiratory system: Clear to auscultation. Respiratory effort normal. Cardiovascular system: S1 & S2 heard, RRR. No JVD, murmurs, rubs, gallops or clicks. No pedal edema. Gastrointestinal system: Abdomen is nondistended, soft and nontender. No organomegaly or masses felt. Normal bowel  sounds heard. Central nervous system: Alert and oriented. No focal neurological deficits. Extremities: Symmetric 5 x 5 power. Skin: No rashes, lesions or ulcers.    Data Reviewed: I have personally reviewed following labs and imaging studies  CBC: Recent Labs  Lab 07/16/22 1320  WBC 8.2  NEUTROABS 5.1  HGB 16.8*  HCT 54.1*  MCV 93.8  PLT 0000000    Basic Metabolic Panel: Recent Labs  Lab 07/16/22 1320  NA 139  K 4.3  CL 103  CO2 27  GLUCOSE 103*  BUN 9  CREATININE 0.87  CALCIUM 8.8*    GFR: Estimated Creatinine Clearance: 108.9 mL/min (by C-G formula based on SCr of 0.87 mg/dL). Liver Function Tests: Recent Labs  Lab 07/16/22 1320  AST 17  ALT 14  ALKPHOS 64  BILITOT 0.4  PROT 7.7  ALBUMIN 3.6    No results for input(s): "LIPASE", "AMYLASE" in the last 168 hours. No results for input(s): "AMMONIA" in the last 168 hours. Coagulation Profile: No results for input(s): "INR", "PROTIME" in the last 168 hours. Cardiac Enzymes: No results for input(s): "CKTOTAL", "CKMB", "CKMBINDEX", "TROPONINI" in the last 168 hours.  BNP (last 3 results) No results for input(s): "PROBNP" in the last 8760 hours. HbA1C: No results for input(s): "HGBA1C" in the last 72 hours. CBG: No results for input(s): "GLUCAP" in the last 168 hours. Lipid Profile: No results for input(s): "CHOL", "HDL", "LDLCALC", "TRIG", "CHOLHDL", "LDLDIRECT" in the last 72 hours. Thyroid Function Tests: No results for input(s): "TSH", "T4TOTAL", "FREET4", "T3FREE", "THYROIDAB" in the last 72 hours. Anemia Panel: No results for input(s): "VITAMINB12", "FOLATE", "FERRITIN", "TIBC", "IRON", "RETICCTPCT" in the last 72 hours. Sepsis Labs: Recent Labs  Lab 07/16/22 1320 07/16/22 1520  LATICACIDVEN 1.2 1.1     Recent Results (from the past 240 hour(s))  Blood Culture (routine x 2)     Status: None (Preliminary result)   Collection Time: 07/16/22  1:20 PM   Specimen: BLOOD  Result Value Ref Range  Status   Specimen Description   Final    BLOOD RIGHT ANTECUBITAL Performed at [facility] [address] [place], [place] [address]    Special Requests   Final    BOTTLES DRAWN AEROBIC AND ANAEROBIC Blood Culture adequate volume Performed at [facility] [address], [place], [place] [address]    Culture   Final    NO GROWTH 4 DAYS Performed at [facility], [place] [address], [place], [place] [address]    Report Status PENDING  Incomplete  Blood Culture (routine x 2)     Status: None (Preliminary result)   Collection Time: 07/16/22  1:25 PM   Specimen: BLOOD  Result Value Ref Range Status   Specimen Description   Final    BLOOD SITE NOT SPECIFIED Performed at [facility] [address], [place], [place] [address]    Special Requests   Final    BOTTLES DRAWN AEROBIC AND ANAEROBIC Blood Culture adequate volume Performed at [facility] [address], [place], [place] [address]    Culture   Final    NO GROWTH 4 DAYS Performed at [facility], [place] [address] [place], [place] [address]    Report Status PENDING  Incomplete  Resp panel by RT-PCR (RSV, Flu A&B, Covid) Anterior Nasal Swab     Status: None   Collection Time: 07/16/22  7:16 PM   Specimen: Anterior Nasal Swab  Result Value Ref Range Status   SARS Coronavirus 2 by RT PCR NEGATIVE NEGATIVE Final    Comment: (NOTE) SARS-CoV-2 target nucleic acids are NOT DETECTED.  The SARS-CoV-2 RNA is generally detectable in upper respiratory specimens during the acute phase of infection. The lowest concentration of SARS-CoV-2 viral copies this assay can detect is 138 copies/mL. A negative result does not preclude SARS-Cov-2 infection and should not be used as the sole basis for treatment or other patient management decisions. A negative result may occur with  improper specimen collection/handling, submission of specimen other than  nasopharyngeal swab, presence of viral mutation(s) within the areas targeted by this assay, and inadequate number of viral copies(<138 copies/mL). A negative result must be combined with clinical observations, patient history, and epidemiological information. The expected result is Negative.  Fact Sheet for Patients:  EntrepreneurPulse.com.au  Fact Sheet for Healthcare Providers:  IncredibleEmployment.be  This test is no t yet approved or cleared by the [place] FDA and  has been authorized for detection and/or diagnosis of SARS-CoV-2 by FDA under an Emergency Use Authorization (EUA). This EUA will remain  in effect (meaning this test can be used) for  the duration of the COVID-19 declaration under Section 564(b)(1) of the Act, 21 U.S.C.section 360bbb-3(b)(1), unless the authorization is terminated  or revoked sooner.       Influenza A by PCR NEGATIVE NEGATIVE Final   Influenza B by PCR NEGATIVE NEGATIVE Final    Comment: (NOTE) The Xpert Xpress SARS-CoV-2/FLU/RSV plus assay is intended as an aid in the diagnosis of influenza from Nasopharyngeal swab specimens and should not be used as a sole basis for treatment. Nasal washings and aspirates are unacceptable for Xpert Xpress SARS-CoV-2/FLU/RSV testing.  Fact Sheet for Patients: EntrepreneurPulse.com.au  Fact Sheet for Healthcare Providers: IncredibleEmployment.be  This test is not yet approved or cleared by the [place] FDA and has been authorized for detection and/or diagnosis of SARS-CoV-2 by FDA under an Emergency Use Authorization (EUA). This EUA will remain in effect (meaning this test can be used) for the duration of the COVID-19 declaration under Section 564(b)(1) of the Act, 21 U.S.C. section 360bbb-3(b)(1), unless the authorization is terminated or revoked.     Resp Syncytial Virus by PCR NEGATIVE NEGATIVE Final    Comment:  (NOTE) Fact Sheet for Patients: EntrepreneurPulse.com.au  Fact Sheet for Healthcare Providers: IncredibleEmployment.be  This test is not yet approved or cleared by the [place] FDA and has been authorized for detection and/or diagnosis of SARS-CoV-2 by FDA under an Emergency Use Authorization (EUA). This EUA will remain in effect (meaning this test can be used) for the duration of the COVID-19 declaration under Section 564(b)(1) of the Act, 21 U.S.C. section 360bbb-3(b)(1), unless the authorization is terminated or revoked.  Performed at [facility], [place] [address], [place], [place] [address]      Radiology Studies: No results found.  Scheduled Meds:  amLODipine  5 mg Oral Daily   ARIPiprazole  10 mg Oral Daily   docusate sodium  100 mg Oral BID   enoxaparin (LOVENOX) injection  60 mg Subcutaneous Q24H   FLUoxetine  40 mg Oral Daily   furosemide  40 mg Oral Daily   gabapentin  800 mg Oral TID   lisinopril  10 mg Oral q AM   mometasone-formoterol  2 puff Inhalation BID   pantoprazole  40 mg Oral Daily   prazosin  1 mg Oral QHS   senna  1 tablet Oral Daily   traZODone  100 mg Oral QHS   Continuous Infusions:   LOS: 0 days   Darliss Cheney, MD [facility]  07/20/2022, 10:12 AM   *Please note that this is a verbal dictation therefore any spelling or grammatical errors are due to the "[place] [facility]" system interpretation.  Please page via [place] and do not message via secure chat for urgent patient care matters. Secure chat can be used for non urgent patient care matters.  How to contact the [facility] Attending or Consulting provider [place] or covering provider during after hours [place], for this patient?  Check the care team in [facility] and look for a) attending/consulting TRH provider listed and b) the [facility] team listed. Page or secure chat [phone]. Log into [email or web address] and use 's universal password  to access. If you do not have the password, please contact the hospital operator. Locate the [facility] provider you are looking for under [facility] and page to a number that you can be directly reached. If you still have difficulty reaching the provider, please page the [facility] (Director on Call) for the Hospitalists listed on amion for assistance.

## 2022-07-20 NOTE — TOC Progression Note (Signed)
Transition of Care [facility]) - Progression Note    Patient Details  Name: Sarah Cervantes MRN: [id number] Date of Birth: April 14, 1981  Transition of Care [facility]) CM/SW [place], LCSW Phone Number: 07/20/2022, 3:44 PM  Clinical Narrative:    CSW contacted the following facilities for psychiatric placement:   -[facility] due to need for O2 -[facility]- no adult beds available  [facility]- Under review -[facility]- Left voicemail -[facility]- left voicemail -[facility]- denied due to need for O2 -[facility]- denied due to need for [facility]- No bed availability  -[facility]- at [facility]- denied due to voluntary status and need for O2 -[facility]- Left voicemail -[facility]- unable to locate referral. Need to resend. [facility]- Have not reviewed referral    Expected Discharge Plan: Psychiatric Hospital Barriers to Discharge: Psych Bed not available  Expected Discharge Plan and Services In-house Referral: Clinical Social Work Discharge Planning Services: CM Consult Post Acute Care Choice: NA Living arrangements for the past 2 months: Hotel/Motel Expected Discharge Date: 07/18/22               DME Arranged: N/A DME Agency: NA                   Social Determinants of Health ([place]) Interventions SDOH Screenings   Food Insecurity: Unknown (03/31/2022)  Recent Concern: Food Insecurity - Food Insecurity Present (03/15/2022)  Housing: High Risk (03/15/2022)  Transportation Needs: Unknown (03/31/2022)  Recent Concern: Transportation Needs - Unmet Transportation Needs (03/15/2022)  Utilities: Unknown (03/31/2022)  Recent Concern: Utilities - At Risk (03/15/2022)  Alcohol Screen: Medium Risk (04/10/2018)  Tobacco Use: Medium Risk (05/27/2022)    Readmission Risk Interventions     No data to display

## 2022-07-21 DIAGNOSIS — R45851 Suicidal ideations: Secondary | ICD-10-CM | POA: Diagnosis not present

## 2022-07-21 DIAGNOSIS — J441 Chronic obstructive pulmonary disease with (acute) exacerbation: Secondary | ICD-10-CM | POA: Diagnosis not present

## 2022-07-21 DIAGNOSIS — F4310 Post-traumatic stress disorder, unspecified: Secondary | ICD-10-CM

## 2022-07-21 DIAGNOSIS — F1120 Opioid dependence, uncomplicated: Secondary | ICD-10-CM | POA: Diagnosis not present

## 2022-07-21 DIAGNOSIS — F1910 Other psychoactive substance abuse, uncomplicated: Secondary | ICD-10-CM

## 2022-07-21 DIAGNOSIS — F1414 Cocaine abuse with cocaine-induced mood disorder: Secondary | ICD-10-CM

## 2022-07-21 DIAGNOSIS — F3175 Bipolar disorder, in partial remission, most recent episode depressed: Secondary | ICD-10-CM | POA: Diagnosis not present

## 2022-07-21 DIAGNOSIS — F1920 Other psychoactive substance dependence, uncomplicated: Secondary | ICD-10-CM | POA: Diagnosis not present

## 2022-07-21 LAB — CULTURE, BLOOD (ROUTINE X 2)
Culture: NO GROWTH
Culture: NO GROWTH
Special Requests: ADEQUATE
Special Requests: ADEQUATE

## 2022-07-21 MED ORDER — LIP MEDEX EX OINT: TOPICAL_OINTMENT | CUTANEOUS | Status: DC | PRN

## 2022-07-21 MED ORDER — OXYCODONE HCL 5 MG PO TABS
10.0000 mg | ORAL_TABLET | Freq: Four times a day (QID) | ORAL | Status: DC | PRN
  Administered 2022-07-21 – 2022-07-26 (×7): 10 mg via ORAL
  Filled 2022-07-21 (×8): qty 2

## 2022-07-21 MED ORDER — DICLOFENAC EPOLAMINE 1.3 % EX PTCH
1.0000 | MEDICATED_PATCH | Freq: Two times a day (BID) | CUTANEOUS | Status: DC
  Administered 2022-07-21 – 2022-07-26 (×11): 1 via TRANSDERMAL
  Filled 2022-07-21 (×11): qty 1

## 2022-07-21 MED ORDER — RAMELTEON 8 MG PO TABS
8.0000 mg | ORAL_TABLET | Freq: Every day | ORAL | Status: DC
  Administered 2022-07-21 – 2022-07-23 (×3): 8 mg via ORAL
  Filled 2022-07-21 (×4): qty 1

## 2022-07-21 NOTE — Progress Notes (Signed)
PROGRESS NOTE    Sarah Cervantes  [id number] DOB: Dec 04, 1980 DOA: 07/16/2022 PCP: Patient, No Pcp Per   Brief Narrative:   Sarah Cervantes is a 42 y.o. female with medical history significant for daily cocaine abuse, alcohol abuse,  bipolar 1 disorder, previous suicide attempts by overdose and self-harm, as well as COPD supposed to be on 2 L nasal cannula oxygen chronically but done not being compliant was admitted to the hospital with complaints of knee pain hip and back pain after falling 2 days back.  She was evaluated after this fall in the emergency department, discharged from the hospital as workup was negative.  He however presented back to the hospital for ongoing discomfort and suicidal ideation.  Patient was then seen by psychiatry, endorsed suicidal ideation with a plan to overdose on medications.  Patient had also been engaged in superficial cutting, about 2 weeks after presentation.  Patient is at risk for homelessness, currently staying in a motel often stays with friends.    Assessment & Plan:   Principal Problem:   COPD with acute exacerbation ([place]) Active Problems:   Polysubstance dependence including opioid drug with daily use (HCC)   Cocaine abuse with cocaine-induced mood disorder (HCC)   PTSD (post-traumatic stress disorder)   Substance induced mood disorder (HCC)   Polysubstance abuse ([place])   Affective psychosis, bipolar ([place])   Suicidal ideation  COPD with chronic hypoxic respiratory failure:   Patient is supposed to use 3 L of oxygen at home but she is noncompliant.  Continue bronchodilators.  No active wheezing.  On 3 to 4 L of oxygen by nasal cannula.  Recent chest x-ray with enlarged cardiac silhouette no acute process.  Continue to monitor for coughing.  Somnolence/history of sleep apnea:  CPAP  Essential hypertension:  Continue amlodipine and prazosin.Marland Kitchen  GERD: Continue PPI  Polysubstance abuse: Positive for cocaine.    Suicidal  ideation/psychosis/bipolar disorder:  Continue Abilify, Prozac, Neurontin and trazodone.  Continue one-to-one sitter.  Psychiatry recommending inpatient psych admission.  Left knee pain/generalized weakness: Patient states that she fell/tripped on her dog before coming here and has been having left knee pain.  X-ray are negative for any fracture.    Seen by PT OT and they recommend SNF.  Will increase the oxycodone to 10 mg every 6 hours.  Continue Neurontin, Tylenol and Motrin.  Morbid obesity: Body mass index is 47.2 kg/m.  Patient would benefit from weight loss as outpatient.    DVT prophylaxis: SCDs Start: 07/17/22 1540   Code Status: Full Code   Family Communication:  None at bedside.  Status is: Observation  The patient will require care spanning > 2 midnights and should be moved to inpatient because: Need for inpatient psychiatry unit, left knee pain and on analgesics.   Consultants:  Psychiatry  Procedures:  None  Antimicrobials:  Anti-infectives (From admission, onward)    None         Subjective:  Today, patient was seen and examined at bedside.  Complains of severe pain on the left knee, cough with mild blood-tinged sputum.  Objective: Vitals:   07/20/22 1943 07/20/22 2141 07/21/22 0430 07/21/22 0816  BP: 116/65  116/67 135/77  Pulse: 89  91   Resp: 20  15   Temp: 98 F (36.7 C)  98 F (36.7 C)   TempSrc: Oral  Oral   SpO2: 94% 93% 93%   Weight:      Height:        Intake/Output Summary (  Last 24 hours) at 07/21/2022 1200 Last data filed at 07/21/2022 0900 Gross per 24 hour  Intake 1920 ml  Output 800 ml  Net 1120 ml    Filed Weights   07/16/22 1310  Weight: 124.7 kg    Physical examination:  General: Obese built, not in obvious distress, anxious HENT:   No scleral pallor or icterus noted. Oral mucosa is moist.  Chest:  Clear breath sounds.  Diminished breath sounds bilaterally. No crackles or wheezes.  CVS: S1 &S2 heard. No murmur.   Regular rate and rhythm. Abdomen: Soft, nontender, nondistended.  Bowel sounds are heard.   Extremities: No cyanosis, clubbing or edema.  Peripheral pulses are palpable.  Tenderness of the left knee without any effusion or redness. Psych: Alert, awake and oriented, normal mood CNS:  No cranial nerve deficits.  Power equal in all extremities.   Skin: Warm and dry.  No rashes noted.   Data Reviewed: I have personally reviewed following labs and imaging studies  CBC: Recent Labs  Lab 07/16/22 1320  WBC 8.2  NEUTROABS 5.1  HGB 16.8*  HCT 54.1*  MCV 93.8  PLT 0000000    Basic Metabolic Panel: Recent Labs  Lab 07/16/22 1320  NA 139  K 4.3  CL 103  CO2 27  GLUCOSE 103*  BUN 9  CREATININE 0.87  CALCIUM 8.8*    GFR: Estimated Creatinine Clearance: 108.9 mL/min (by C-G formula based on SCr of 0.87 mg/dL). Liver Function Tests: Recent Labs  Lab 07/16/22 1320  AST 17  ALT 14  ALKPHOS 64  BILITOT 0.4  PROT 7.7  ALBUMIN 3.6    No results for input(s): "LIPASE", "AMYLASE" in the last 168 hours. No results for input(s): "AMMONIA" in the last 168 hours. Coagulation Profile: No results for input(s): "INR", "PROTIME" in the last 168 hours. Cardiac Enzymes: No results for input(s): "CKTOTAL", "CKMB", "CKMBINDEX", "TROPONINI" in the last 168 hours. BNP (last 3 results) No results for input(s): "PROBNP" in the last 8760 hours. HbA1C: No results for input(s): "HGBA1C" in the last 72 hours. CBG: No results for input(s): "GLUCAP" in the last 168 hours. Lipid Profile: No results for input(s): "CHOL", "HDL", "LDLCALC", "TRIG", "CHOLHDL", "LDLDIRECT" in the last 72 hours. Thyroid Function Tests: No results for input(s): "TSH", "T4TOTAL", "FREET4", "T3FREE", "THYROIDAB" in the last 72 hours. Anemia Panel: No results for input(s): "VITAMINB12", "FOLATE", "FERRITIN", "TIBC", "IRON", "RETICCTPCT" in the last 72 hours. Sepsis Labs: Recent Labs  Lab 07/16/22 1320 07/16/22 1520   LATICACIDVEN 1.2 1.1     Recent Results (from the past 240 hour(s))  Blood Culture (routine x 2)     Status: None   Collection Time: 07/16/22  1:20 PM   Specimen: BLOOD  Result Value Ref Range Status   Specimen Description   Final    BLOOD RIGHT ANTECUBITAL Performed at [facility] [address] [place], [place] [address]    Special Requests   Final    BOTTLES DRAWN AEROBIC AND ANAEROBIC Blood Culture adequate volume Performed at [facility] [address] [place], [place] [address]    Culture   Final    NO GROWTH 5 DAYS Performed at [facility], [place] [address] [place], [place] [address]    Report Status 07/21/2022 FINAL  Final  Blood Culture (routine x 2)     Status: None   Collection Time: 07/16/22  1:25 PM   Specimen: BLOOD  Result Value Ref Range Status  Specimen Description   Final    BLOOD SITE NOT SPECIFIED Performed at [facility] [address] [place], [place] [address]    Special Requests   Final    BOTTLES DRAWN AEROBIC AND ANAEROBIC Blood Culture adequate volume Performed at [facility] [address], [place], [place] [address]    Culture   Final    NO GROWTH 5 DAYS Performed at [facility], [place] [address], [place], [place] [address]    Report Status 07/21/2022 FINAL  Final  Resp panel by RT-PCR (RSV, Flu A&B, Covid) Anterior Nasal Swab     Status: None   Collection Time: 07/16/22  7:16 PM   Specimen: Anterior Nasal Swab  Result Value Ref Range Status   SARS Coronavirus 2 by RT PCR NEGATIVE NEGATIVE Final    Comment: (NOTE) SARS-CoV-2 target nucleic acids are NOT DETECTED.  The SARS-CoV-2 RNA is generally detectable in upper respiratory specimens during the acute phase of infection. The lowest concentration of SARS-CoV-2 viral copies this assay can detect is 138 copies/mL. A negative result does not preclude SARS-Cov-2 infection and should not be  used as the sole basis for treatment or other patient management decisions. A negative result may occur with  improper specimen collection/handling, submission of specimen other than nasopharyngeal swab, presence of viral mutation(s) within the areas targeted by this assay, and inadequate number of viral copies(<138 copies/mL). A negative result must be combined with clinical observations, patient history, and epidemiological information. The expected result is Negative.  Fact Sheet for Patients:  EntrepreneurPulse.com.au  Fact Sheet for Healthcare Providers:  IncredibleEmployment.be  This test is no t yet approved or cleared by the [place] FDA and  has been authorized for detection and/or diagnosis of SARS-CoV-2 by FDA under an Emergency Use Authorization (EUA). This EUA will remain  in effect (meaning this test can be used) for the duration of the COVID-19 declaration under Section 564(b)(1) of the Act, 21 U.S.C.section 360bbb-3(b)(1), unless the authorization is terminated  or revoked sooner.       Influenza A by PCR NEGATIVE NEGATIVE Final   Influenza B by PCR NEGATIVE NEGATIVE Final    Comment: (NOTE) The Xpert Xpress SARS-CoV-2/FLU/RSV plus assay is intended as an aid in the diagnosis of influenza from Nasopharyngeal swab specimens and should not be used as a sole basis for treatment. Nasal washings and aspirates are unacceptable for Xpert Xpress SARS-CoV-2/FLU/RSV testing.  Fact Sheet for Patients: EntrepreneurPulse.com.au  Fact Sheet for Healthcare Providers: IncredibleEmployment.be  This test is not yet approved or cleared by the [place] FDA and has been authorized for detection and/or diagnosis of SARS-CoV-2 by FDA under an Emergency Use Authorization (EUA). This EUA will remain in effect (meaning this test can be used) for the duration of the COVID-19 declaration under Section  564(b)(1) of the Act, 21 U.S.C. section 360bbb-3(b)(1), unless the authorization is terminated or revoked.     Resp Syncytial Virus by PCR NEGATIVE NEGATIVE Final    Comment: (NOTE) Fact Sheet for Patients: EntrepreneurPulse.com.au  Fact Sheet for Healthcare Providers: IncredibleEmployment.be  This test is not yet approved or cleared by the [place] FDA and has been authorized for detection and/or diagnosis of SARS-CoV-2 by FDA under an Emergency Use Authorization (EUA). This EUA will remain in effect (meaning this test can be used) for the duration of the COVID-19 declaration under Section 564(b)(1) of the Act, 21 U.S.C. section 360bbb-3(b)(1), unless the authorization is terminated or revoked.  Performed at [facility], [place] [address], [place], [place] [address]      Radiology Studies: No results found.  Scheduled Meds:  amLODipine  5 mg Oral Daily   ARIPiprazole  10 mg Oral Daily   docusate sodium  100 mg Oral BID   enoxaparin (LOVENOX) injection  60 mg Subcutaneous Q24H   FLUoxetine  40 mg Oral Daily   furosemide  40 mg Oral Daily   gabapentin  800 mg Oral TID   lisinopril  10 mg Oral q AM   mometasone-formoterol  2 puff Inhalation BID   pantoprazole  40 mg Oral Daily   prazosin  1 mg Oral QHS   senna  1 tablet Oral Daily   traZODone  100 mg Oral QHS   Continuous Infusions:   LOS: 0 days   Flora Lipps, MD [facility] 07/21/2022, 12:00 PM
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
## 2022-07-22 DIAGNOSIS — F319 Bipolar disorder, unspecified: Secondary | ICD-10-CM

## 2022-07-22 DIAGNOSIS — F1414 Cocaine abuse with cocaine-induced mood disorder: Secondary | ICD-10-CM | POA: Diagnosis not present

## 2022-07-22 DIAGNOSIS — F1910 Other psychoactive substance abuse, uncomplicated: Secondary | ICD-10-CM | POA: Diagnosis not present

## 2022-07-22 DIAGNOSIS — J441 Chronic obstructive pulmonary disease with (acute) exacerbation: Secondary | ICD-10-CM | POA: Diagnosis not present

## 2022-07-22 LAB — BASIC METABOLIC PANEL
Anion gap: 7 (ref 5–15)
BUN: 15 mg/dL (ref 6–20)
CO2: 29 mmol/L (ref 22–32)
Calcium: 8.5 mg/dL — ABNORMAL LOW (ref 8.9–10.3)
Chloride: 98 mmol/L (ref 98–111)
Creatinine, Ser: 0.78 mg/dL (ref 0.44–1.00)
GFR, Estimated: >60 mL/min (ref >60)
Glucose, Bld: 129 mg/dL — ABNORMAL HIGH (ref 70–99)
Potassium: 4.2 mmol/L (ref 3.5–5.1)
Sodium: 134 mmol/L — ABNORMAL LOW (ref 135–145)

## 2022-07-22 LAB — CBC
HCT: 41.9 % (ref 36.0–46.0)
Hemoglobin: 12.8 g/dL (ref 12.0–15.0)
MCH: 29 pg (ref 26.0–34.0)
MCHC: 30.5 g/dL (ref 30.0–36.0)
MCV: 95 fL (ref 80.0–100.0)
Platelets: 328 10*3/uL (ref 150–400)
RBC: 4.41 MIL/uL (ref 3.87–5.11)
RDW: 16.6 % — ABNORMAL HIGH (ref 11.5–15.5)
WBC: 7 10*3/uL (ref 4.0–10.5)
nRBC: 0 % (ref 0.0–0.2)

## 2022-07-22 LAB — MAGNESIUM: Magnesium: 2.2 mg/dL (ref 1.7–2.4)

## 2022-07-22 NOTE — Progress Notes (Signed)
Patient refuses cpap for tonight

## 2022-07-22 NOTE — Progress Notes (Signed)
PROGRESS NOTE    Sarah Cervantes  [id number] DOB: 1981/02/02 DOA: 07/16/2022 PCP: Patient, No Pcp Per   Brief Narrative:   Sarah Cervantes is a 42 y.o. female with medical history significant for daily cocaine abuse, alcohol abuse,  bipolar 1 disorder, previous suicide attempts by overdose and self-harm, as well as COPD supposed to be on 2 L nasal cannula oxygen chronically but done not being compliant was admitted to the hospital with complaints of knee pain hip and back pain after falling 2 days back.  She was evaluated after this fall in the emergency department, discharged from the hospital as workup was negative.  He however presented back to the hospital for ongoing discomfort and suicidal ideation.  Patient was then seen by psychiatry, endorsed suicidal ideation with a plan to overdose on medications.  Patient had also been engaged in superficial cutting, about 2 weeks after presentation.  Patient is at risk for homelessness, currently staying in a motel often stays with friends.    Assessment & Plan:   Principal Problem:   COPD with acute exacerbation ([place]) Active Problems:   Polysubstance dependence including opioid drug with daily use (HCC)   Cocaine abuse with cocaine-induced mood disorder (HCC)   PTSD (post-traumatic stress disorder)   Substance induced mood disorder (HCC)   Polysubstance abuse ([place])   Affective psychosis, bipolar ([place])   Suicidal ideation  COPD with chronic hypoxic respiratory failure:   Patient is supposed to use 3 L of oxygen at home but she is noncompliant.  Continue bronchodilators.  No active wheezing.  On 2 L of oxygen by nasal cannula at this time..  Recent chest x-ray with enlarged cardiac silhouette no acute process.  Continue to monitor   Tinge of blood on coughing.  Could be mild bronchitis.  Observation.  Stable.  Somnolence/history of sleep apnea:  Encourage CPAP but has been refusing.  Essential hypertension:  Continue amlodipine and  prazosin.Marland Kitchen  GERD: Continue PPI  Polysubstance abuse: Positive for cocaine.  Psychiatry on board and adjusting medications.  Suicidal ideation/psychosis/bipolar disorder:  Continue Abilify, Prozac, Neurontin and trazodone.  Continue one-to-one sitter.  Psychiatry recommending inpatient psych admission.  Left knee pain/generalized weakness: Patient states that she fell/tripped on her dog before coming here and has been having left knee pain.  X-ray are negative for any fracture.   Feels much better better after Lidoderm patch and oxycodone.  Continue Neurontin, Tylenol and Motrin.  Morbid obesity: Body mass index is 47.2 kg/m.  Patient would benefit from weight loss as outpatient.    Disposition.  Medically stable for disposition to inpatient psych facility.  DVT prophylaxis: SCDs Start: 07/17/22 1540   Code Status: Full Code   Family Communication:  None at bedside.  Status is: Observation  The patient will require care spanning > 2 midnights and should be moved to inpatient because: Need for inpatient psychiatry unit   Consultants:  Psychiatry  Procedures:  None  Antimicrobials:  Anti-infectives (From admission, onward)    None         Subjective:  Today, patient was seen and examined at bedside.  Feels much better with her knee today.  Eating food.  Mild cough but no fever chills or rigor.    Objective: Vitals:   07/21/22 1238 07/21/22 1946 07/21/22 2027 07/22/22 0453  BP: 126/87 115/66  109/69  Pulse: 93 84  79  Resp: '19 20  20  '$ Temp: 98.7 F (37.1 C) 98.2 F (36.8 C)  97.9 F (36.6  C)  TempSrc: Oral Oral  Oral  SpO2: 99% 100% 97% 99%  Weight:      Height:        Intake/Output Summary (Last 24 hours) at 07/22/2022 1129 Last data filed at 07/22/2022 [id number] Gross per 24 hour  Intake 1552 ml  Output 600 ml  Net 952 ml    Filed Weights   07/16/22 1310  Weight: 124.7 kg    Physical examination: Body mass index is 47.2 kg/m.  General: Morbidly  obese built, not in obvious distress, anxious, on 2 L of oxygen by nasal cannula HENT:   No scleral pallor or icterus noted. Oral mucosa is moist.  Chest:    Diminished breath sounds bilaterally. No crackles or wheezes.  CVS: S1 &S2 heard. No murmur.  Regular rate and rhythm. Abdomen: Soft, nontender, nondistended.  Bowel sounds are heard.   Extremities: No cyanosis, clubbing or edema.  Peripheral pulses are palpable.  Left knee joint line tenderness without obvious bruising or effusion.   Psych: Alert, awake and oriented, normal mood CNS:  No cranial nerve deficits.  Power equal in all extremities.   Skin: Warm and dry.  No rashes noted.   Data Reviewed: I have personally reviewed following labs and imaging studies  CBC: Recent Labs  Lab 07/16/22 1320 07/22/22 0633  WBC 8.2 7.0  NEUTROABS 5.1  --   HGB 16.8* 12.8  HCT 54.1* 41.9  MCV 93.8 95.0  PLT 392 [id number]    Basic Metabolic Panel: Recent Labs  Lab 07/16/22 1320 07/22/22 0633  NA 139 134*  K 4.3 4.2  CL 103 98  CO2 27 29  GLUCOSE 103* 129*  BUN 9 15  CREATININE 0.87 0.78  CALCIUM 8.8* 8.5*  MG  --  2.2    GFR: Estimated Creatinine Clearance: 118.4 mL/min (by C-G formula based on SCr of 0.78 mg/dL). Liver Function Tests: Recent Labs  Lab 07/16/22 1320  AST 17  ALT 14  ALKPHOS 64  BILITOT 0.4  PROT 7.7  ALBUMIN 3.6    No results for input(s): "LIPASE", "AMYLASE" in the last 168 hours. No results for input(s): "AMMONIA" in the last 168 hours. Coagulation Profile: No results for input(s): "INR", "PROTIME" in the last 168 hours. Cardiac Enzymes: No results for input(s): "CKTOTAL", "CKMB", "CKMBINDEX", "TROPONINI" in the last 168 hours. BNP (last 3 results) No results for input(s): "PROBNP" in the last 8760 hours. HbA1C: No results for input(s): "HGBA1C" in the last 72 hours. CBG: No results for input(s): "GLUCAP" in the last 168 hours. Lipid Profile: No results for input(s): "CHOL", "HDL", "LDLCALC",  "TRIG", "CHOLHDL", "LDLDIRECT" in the last 72 hours. Thyroid Function Tests: No results for input(s): "TSH", "T4TOTAL", "FREET4", "T3FREE", "THYROIDAB" in the last 72 hours. Anemia Panel: No results for input(s): "VITAMINB12", "FOLATE", "FERRITIN", "TIBC", "IRON", "RETICCTPCT" in the last 72 hours. Sepsis Labs: Recent Labs  Lab 07/16/22 1320 07/16/22 1520  LATICACIDVEN 1.2 1.1     Recent Results (from the past 240 hour(s))  Blood Culture (routine x 2)     Status: None   Collection Time: 07/16/22  1:20 PM   Specimen: BLOOD  Result Value Ref Range Status   Specimen Description   Final    BLOOD RIGHT ANTECUBITAL Performed at [facility] [address] [place], [place] [address]    Special Requests   Final    BOTTLES DRAWN AEROBIC AND ANAEROBIC Blood Culture adequate volume Performed at [facility] [name]  [place], [place] [address]    Culture   Final    NO GROWTH 5 DAYS Performed at [facility], [place] [address], [place], [place] [address]    Report Status 07/21/2022 FINAL  Final  Blood Culture (routine x 2)     Status: None   Collection Time: 07/16/22  1:25 PM   Specimen: BLOOD  Result Value Ref Range Status   Specimen Description   Final    BLOOD SITE NOT SPECIFIED Performed at [facility] [address] [place], [place] [address]    Special Requests   Final    BOTTLES DRAWN AEROBIC AND ANAEROBIC Blood Culture adequate volume Performed at [facility] [address], [place], [place] [address]    Culture   Final    NO GROWTH 5 DAYS Performed at [facility], [place] [address], [place], [place] [address]    Report Status 07/21/2022 FINAL  Final  Resp panel by RT-PCR (RSV, Flu A&B, Covid) Anterior Nasal Swab     Status: None   Collection Time: 07/16/22  7:16 PM   Specimen: Anterior Nasal Swab  Result Value Ref Range Status   SARS Coronavirus 2 by RT PCR NEGATIVE  NEGATIVE Final    Comment: (NOTE) SARS-CoV-2 target nucleic acids are NOT DETECTED.  The SARS-CoV-2 RNA is generally detectable in upper respiratory specimens during the acute phase of infection. The lowest concentration of SARS-CoV-2 viral copies this assay can detect is 138 copies/mL. A negative result does not preclude SARS-Cov-2 infection and should not be used as the sole basis for treatment or other patient management decisions. A negative result may occur with  improper specimen collection/handling, submission of specimen other than nasopharyngeal swab, presence of viral mutation(s) within the areas targeted by this assay, and inadequate number of viral copies(<138 copies/mL). A negative result must be combined with clinical observations, patient history, and epidemiological information. The expected result is Negative.  Fact Sheet for Patients:  EntrepreneurPulse.com.au  Fact Sheet for Healthcare Providers:  IncredibleEmployment.be  This test is no t yet approved or cleared by the [place] FDA and  has been authorized for detection and/or diagnosis of SARS-CoV-2 by FDA under an Emergency Use Authorization (EUA). This EUA will remain  in effect (meaning this test can be used) for the duration of the COVID-19 declaration under Section 564(b)(1) of the Act, 21 U.S.C.section 360bbb-3(b)(1), unless the authorization is terminated  or revoked sooner.       Influenza A by PCR NEGATIVE NEGATIVE Final   Influenza B by PCR NEGATIVE NEGATIVE Final    Comment: (NOTE) The Xpert Xpress SARS-CoV-2/FLU/RSV plus assay is intended as an aid in the diagnosis of influenza from Nasopharyngeal swab specimens and should not be used as a sole basis for treatment. Nasal washings and aspirates are unacceptable for Xpert Xpress SARS-CoV-2/FLU/RSV testing.  Fact Sheet for Patients: EntrepreneurPulse.com.au  Fact Sheet for Healthcare  Providers: IncredibleEmployment.be  This test is not yet approved or cleared by the [place] FDA and has been authorized for detection and/or diagnosis of SARS-CoV-2 by FDA under an Emergency Use Authorization (EUA). This EUA will remain in effect (meaning this test can be used) for the duration of the COVID-19 declaration under Section 564(b)(1) of the Act, 21 U.S.C. section 360bbb-3(b)(1), unless the authorization is terminated or revoked.     Resp Syncytial Virus by PCR NEGATIVE NEGATIVE Final    Comment: (NOTE) Fact Sheet for Patients: EntrepreneurPulse.com.au  Fact Sheet for  Healthcare Providers: IncredibleEmployment.be  This test is not yet approved or cleared by the [place] and has been authorized for detection and/or diagnosis of SARS-CoV-2 by FDA under an Emergency Use Authorization (EUA). This EUA will remain in effect (meaning this test can be used) for the duration of the COVID-19 declaration under Section 564(b)(1) of the Act, 21 U.S.C. section 360bbb-3(b)(1), unless the authorization is terminated or revoked.  Performed at [facility], [place] [address], [place], [place] [address]      Radiology Studies: No results found.  Scheduled Meds:  amLODipine  5 mg Oral Daily   ARIPiprazole  10 mg Oral Daily   diclofenac  1 patch Transdermal BID   docusate sodium  100 mg Oral BID   enoxaparin (LOVENOX) injection  60 mg Subcutaneous Q24H   FLUoxetine  40 mg Oral Daily   furosemide  40 mg Oral Daily   gabapentin  800 mg Oral TID   lisinopril  10 mg Oral q AM   mometasone-formoterol  2 puff Inhalation BID   pantoprazole  40 mg Oral Daily   prazosin  1 mg Oral QHS   ramelteon  8 mg Oral QHS   senna  1 tablet Oral Daily   Continuous Infusions:   LOS: 0 days   Flora Lipps, MD [facility] 07/22/2022, 11:29 AM

## 2022-07-23 ENCOUNTER — Encounter (HOSPITAL_COMMUNITY): Payer: Self-pay | Admitting: Internal Medicine

## 2022-07-23 DIAGNOSIS — F1910 Other psychoactive substance abuse, uncomplicated: Secondary | ICD-10-CM | POA: Diagnosis not present

## 2022-07-23 DIAGNOSIS — F319 Bipolar disorder, unspecified: Secondary | ICD-10-CM | POA: Diagnosis not present

## 2022-07-23 DIAGNOSIS — J441 Chronic obstructive pulmonary disease with (acute) exacerbation: Secondary | ICD-10-CM | POA: Diagnosis not present

## 2022-07-23 DIAGNOSIS — F1414 Cocaine abuse with cocaine-induced mood disorder: Secondary | ICD-10-CM | POA: Diagnosis not present

## 2022-07-23 NOTE — Progress Notes (Signed)
Physical Therapy Treatment Patient Details Name: Sarah Cervantes MRN: [id number] DOB: [birth year] Today's Date: 07/23/2022   History of Present Illness Pt is 42 yo female admitted 07/16/22 with lower extremity pain and swelling after a fall.  Xrays of pelvis, L foot, ankle, knee all negative for acute fracture or dislocation.  Per ED note, cannot rule our ligamentous injury, placed pt in [place], and recommended outpatient ortho. Pt also reported suicidal ideation and now with TTS recommending inpatient psych at d/c.  Pt with history including but not limited to cocaine, heroin, and ETOH abuse, COPD and supposed to be on 2 L [place] but noncompliant, bipolar.    PT Comments    Pt very cooperative and progressing with mobility with increased distance ambulated with RW - pt states distance more limited by fatigue than by knee pain this date.  Pt on RA but maintained O2 sats 92-94% - RN aware.   Recommendations for follow up therapy are one component of a multi-disciplinary discharge planning process, led by the attending physician.  Recommendations may be updated based on patient status, additional functional criteria and insurance authorization.  Follow Up Recommendations  Skilled nursing-short term rehab (<3 hours/day) Can patient physically be transported by private vehicle: Yes   Assistance Recommended at Discharge Frequent or constant Supervision/Assistance  Patient can return home with the following Assistance with cooking/housework;Help with stairs or ramp for entrance;A little help with walking and/or transfers;A little help with bathing/dressing/bathroom   Equipment Recommendations  Rolling walker (2 wheels);Wheelchair cushion (measurements PT);Wheelchair (measurements PT);BSC/3in1    Recommendations for Other Services       Precautions / Restrictions Precautions Precautions: Fall Required Braces or Orthoses: Other Brace;Knee Immobilizer - Left Knee Immobilizer - Left:  (no orders for KI  but in room) Other Brace: No orders but KI was placed in ED. Restrictions Weight Bearing Restrictions: No Other Position/Activity Restrictions: WBAT     Mobility  Bed Mobility Overal bed mobility: Needs Assistance Bed Mobility: Supine to Sit     Supine to sit: Supervision     General bed mobility comments: use of bed rail but no physical assist    Transfers Overall transfer level: Needs assistance Equipment used: Rolling walker (2 wheels) Transfers: Sit to/from Stand Sit to Stand: Min guard, From elevated surface           General transfer comment: cues for use of UEs to self assist    Ambulation/Gait Ambulation/Gait assistance: Min guard Gait Distance (Feet): 39 Feet Assistive device: Rolling walker (2 wheels) Gait Pattern/deviations: Step-to pattern, Decreased stride length, Decreased weight shift to left, Antalgic Gait velocity: decreased     General Gait Details: Mild instability with RW; distance ltd by fatigue; pt reports increased WOB on RA with sats maintained 92-94%   Stairs             Wheelchair Mobility    Modified Rankin (Stroke Patients Only)       Balance Overall balance assessment: Mild deficits observed, not formally tested Sitting-balance support: No upper extremity supported Sitting balance-Leahy Scale: Normal     Standing balance support: Single extremity supported Standing balance-Leahy Scale: Poor                              Cognition Arousal/Alertness: Awake/alert Behavior During Therapy: WFL for tasks assessed/performed Overall Cognitive Status: Within Functional Limits for tasks assessed  Exercises General Exercises - Lower Extremity Ankle Circles/Pumps: AROM, Both, 15 reps, Supine Quad Sets: AROM, Both, Other reps (comment) (2 reps only - pt reports increased discomfort at L medial proximal patella) Long Arc Quad: AAROM, Left, 15 reps,  Supine    General Comments        Pertinent Vitals/Pain Pain Assessment Pain Assessment: 0-10 Pain Score: 3  Pain Location: L knee Pain Descriptors / Indicators: Tightness Pain Intervention(s): Limited activity within patient's tolerance, Monitored during session    Home Living                          Prior Function            PT Goals (current goals can now be found in the care plan section) Acute Rehab PT Goals Patient Stated Goal: decrease pain PT Goal Formulation: With patient Time For Goal Achievement: 08/01/22 Potential to Achieve Goals: Good Progress towards PT goals: Progressing toward goals    Frequency    Min 3X/week      PT Plan Current plan remains appropriate    Co-evaluation              AM-PAC PT "6 Clicks" Mobility   Outcome Measure  Help needed turning from your back to your side while in a flat bed without using bedrails?: A Little Help needed moving from lying on your back to sitting on the side of a flat bed without using bedrails?: A Little Help needed moving to and from a bed to a chair (including a wheelchair)?: A Little Help needed standing up from a chair using your arms (e.g., wheelchair or bedside chair)?: A Little Help needed to walk in hospital room?: A Little Help needed climbing 3-5 steps with a railing? : Total 6 Click Score: 16    End of Session Equipment Utilized During Treatment: Gait belt Activity Tolerance: Patient tolerated treatment well Patient left: in chair;with call bell/phone within reach;with nursing/sitter in room Nurse Communication: Mobility status PT Visit Diagnosis: Other abnormalities of gait and mobility (R26.89);Muscle weakness (generalized) (M62.81)     Time: [id number] PT Time Calculation (min) (ACUTE ONLY): 24 min  Charges:  $Gait Training: 8-22 mins                     Melvina Pager [phone] Office  [phone]    [facility] 07/23/2022, 12:39 PM

## 2022-07-23 NOTE — Progress Notes (Signed)
PROGRESS NOTE    Sarah Cervantes  [id number] DOB: 11/28/1980 DOA: 07/16/2022 PCP: Patient, No Pcp Per   Brief Narrative:   Sarah Cervantes is a 42 y.o. female with medical history significant for daily cocaine abuse, alcohol abuse,  bipolar 1 disorder, previous suicide attempts by overdose and self-harm, as well as COPD supposed to be on 2 L nasal cannula oxygen chronically but done not being compliant was admitted to the hospital with complaints of knee pain hip and back pain after falling 2 days back.  She was evaluated after this fall in the emergency department, discharged from the hospital as workup was negative.  He however presented back to the hospital for ongoing discomfort and suicidal ideation.  Patient was then seen by psychiatry, endorsed suicidal ideation with a plan to overdose on medications.  Patient had also been engaged in superficial cutting, about 2 weeks after presentation.  Patient is at risk for homelessness, currently staying in a motel often stays with friends.    Assessment & Plan:   Principal Problem:   COPD with acute exacerbation ([place]) Active Problems:   Polysubstance dependence including opioid drug with daily use ([facility])   Cocaine abuse with cocaine-induced mood disorder ([facility])   PTSD (post-traumatic stress disorder)   Substance induced mood disorder ([facility])   Polysubstance abuse ([place])   Affective psychosis, bipolar ([place])   Suicidal ideation  COPD with chronic hypoxic respiratory failure:   Patient is supposed to use 3 L of oxygen at home but she was noncompliant.  Continue bronchodilators.    On 3 L of oxygen by nasal cannula at this time and appears stable..  Recent chest x-ray with enlarged cardiac silhouette no acute process.    Tinge of blood on coughing.  Could be mild bronchitis.  Improved.  Stable.  Somnolence/history of sleep apnea:  Encourage CPAP but has been refusing.  Essential hypertension:  Continue amlodipine and prazosin.Marland Kitchen  GERD:  Continue PPI  Polysubstance abuse: Positive for cocaine.  Psychiatry on board and adjusting medications.  Suicidal ideation/psychosis/bipolar disorder:  Continue Abilify, Prozac, Neurontin and trazodone.  Continue one-to-one sitter.  Psychiatry recommending inpatient psych admission.  Left knee pain/generalized weakness: Patient states that she fell/tripped on her dog before coming here and has been having left knee pain.  X-ray are negative for any fracture.   Feels much better better after Lidoderm patch and oxycodone.  Continue Neurontin, Tylenol and Motrin.  Morbid obesity: Body mass index is 47.2 kg/m.  Patient would benefit from weight loss as outpatient.    Disposition.  Medically stable for disposition to inpatient psych facility.  DVT prophylaxis: SCDs Start: 07/17/22 1540   Code Status: Full Code   Family Communication:  None at bedside.  Status is: Observation  The patient will require care spanning > 2 midnights and should be moved to inpatient because: Need for inpatient psychiatry unit   Consultants:  Psychiatry  Procedures:  None  Antimicrobials:  Anti-infectives (From admission, onward)    None         Subjective:  Today, patient was seen and examined at bedside.  Patient denies interval complaints.  Has mild cough but no phlegm.  No overt pain fever chills or rigor.  Objective: Vitals:   07/22/22 2158 07/23/22 0607 07/23/22 0756 07/23/22 1308  BP:  99/60  (!) 140/72  Pulse:  80  79  Resp:  20  18  Temp:  (!) 97.5 F (36.4 C)  98.1 F (36.7 C)  TempSrc:  Oral  SpO2: 98% 96% 94% 96%  Weight:      Height:        Intake/Output Summary (Last 24 hours) at 07/23/2022 1401 Last data filed at 07/22/2022 1500 Gross per 24 hour  Intake 472 ml  Output --  Net 472 ml    Filed Weights   07/16/22 1310  Weight: 124.7 kg    Physical examination: Body mass index is 47.2 kg/m.   General: Morbidly obese built, anxious, on 3 L of oxygen by nasal  cannula HENT:   No scleral pallor or icterus noted. Oral mucosa is moist.  Chest:    Diminished breath sounds bilaterally. CVS: S1 &S2 heard. No murmur.  Regular rate and rhythm. Abdomen: Soft, nontender, nondistended.  Bowel sounds are heard.   Extremities: No cyanosis, clubbing or edema.  Peripheral pulses are palpable.   Psych: Alert, awake and oriented, mildly anxious, CNS:  No cranial nerve deficits.  Power equal in all extremities.   Skin: Warm and dry.  No rashes noted.   Data Reviewed: I have personally reviewed following labs and imaging studies  CBC: Recent Labs  Lab 07/22/22 0633  WBC 7.0  HGB 12.8  HCT 41.9  MCV 95.0  PLT [id number]    Basic Metabolic Panel: Recent Labs  Lab 07/22/22 0633  NA 134*  K 4.2  CL 98  CO2 29  GLUCOSE 129*  BUN 15  CREATININE 0.78  CALCIUM 8.5*  MG 2.2    GFR: Estimated Creatinine Clearance: 118.4 mL/min (by C-G formula based on SCr of 0.78 mg/dL). Liver Function Tests: No results for input(s): "AST", "ALT", "ALKPHOS", "BILITOT", "PROT", "ALBUMIN" in the last 168 hours.  No results for input(s): "LIPASE", "AMYLASE" in the last 168 hours. No results for input(s): "AMMONIA" in the last 168 hours. Coagulation Profile: No results for input(s): "INR", "PROTIME" in the last 168 hours. Cardiac Enzymes: No results for input(s): "CKTOTAL", "CKMB", "CKMBINDEX", "TROPONINI" in the last 168 hours. BNP (last 3 results) No results for input(s): "PROBNP" in the last 8760 hours. HbA1C: No results for input(s): "HGBA1C" in the last 72 hours. CBG: No results for input(s): "GLUCAP" in the last 168 hours. Lipid Profile: No results for input(s): "CHOL", "HDL", "LDLCALC", "TRIG", "CHOLHDL", "LDLDIRECT" in the last 72 hours. Thyroid Function Tests: No results for input(s): "TSH", "T4TOTAL", "FREET4", "T3FREE", "THYROIDAB" in the last 72 hours. Anemia Panel: No results for input(s): "VITAMINB12", "FOLATE", "FERRITIN", "TIBC", "IRON", "RETICCTPCT"  in the last 72 hours. Sepsis Labs: Recent Labs  Lab 07/16/22 1520  LATICACIDVEN 1.1     Recent Results (from the past 240 hour(s))  Blood Culture (routine x 2)     Status: None   Collection Time: 07/16/22  1:20 PM   Specimen: BLOOD  Result Value Ref Range Status   Specimen Description   Final    BLOOD RIGHT ANTECUBITAL Performed at [facility] [address], [place], [place] [address]    Special Requests   Final    BOTTLES DRAWN AEROBIC AND ANAEROBIC Blood Culture adequate volume Performed at [facility] [address] [place], [place] [address]    Culture   Final    NO GROWTH 5 DAYS Performed at [facility], [place] [address] [place],  [address]    Report Status 07/21/2022 FINAL  Final  Blood Culture (routine x 2)     Status: None   Collection Time: 07/16/22  1:25 PM   Specimen: BLOOD  Result Value Ref Range  Status   Specimen Description   Final    BLOOD SITE NOT SPECIFIED Performed at [facility] [address], [place], [place] [address]    Special Requests   Final    BOTTLES DRAWN AEROBIC AND ANAEROBIC Blood Culture adequate volume Performed at [facility] [address] [place], [place] [address]    Culture   Final    NO GROWTH 5 DAYS Performed at [facility], [place] [address] [place], [place] [address]    Report Status 07/21/2022 FINAL  Final  Resp panel by RT-PCR (RSV, Flu A&B, Covid) Anterior Nasal Swab     Status: None   Collection Time: 07/16/22  7:16 PM   Specimen: Anterior Nasal Swab  Result Value Ref Range Status   SARS Coronavirus 2 by RT PCR NEGATIVE NEGATIVE Final    Comment: (NOTE) SARS-CoV-2 target nucleic acids are NOT DETECTED.  The SARS-CoV-2 RNA is generally detectable in upper respiratory specimens during the acute phase of infection. The lowest concentration of SARS-CoV-2 viral copies this assay can detect is 138 copies/mL. A negative  result does not preclude SARS-Cov-2 infection and should not be used as the sole basis for treatment or other patient management decisions. A negative result may occur with  improper specimen collection/handling, submission of specimen other than nasopharyngeal swab, presence of viral mutation(s) within the areas targeted by this assay, and inadequate number of viral copies(<138 copies/mL). A negative result must be combined with clinical observations, patient history, and epidemiological information. The expected result is Negative.  Fact Sheet for Patients:  EntrepreneurPulse.com.au  Fact Sheet for Healthcare Providers:  IncredibleEmployment.be  This test is no t yet approved or cleared by the [place] FDA and  has been authorized for detection and/or diagnosis of SARS-CoV-2 by FDA under an Emergency Use Authorization (EUA). This EUA will remain  in effect (meaning this test can be used) for the duration of the COVID-19 declaration under Section 564(b)(1) of the Act, 21 U.S.C.section 360bbb-3(b)(1), unless the authorization is terminated  or revoked sooner.       Influenza A by PCR NEGATIVE NEGATIVE Final   Influenza B by PCR NEGATIVE NEGATIVE Final    Comment: (NOTE) The Xpert Xpress SARS-CoV-2/FLU/RSV plus assay is intended as an aid in the diagnosis of influenza from Nasopharyngeal swab specimens and should not be used as a sole basis for treatment. Nasal washings and aspirates are unacceptable for Xpert Xpress SARS-CoV-2/FLU/RSV testing.  Fact Sheet for Patients: EntrepreneurPulse.com.au  Fact Sheet for Healthcare Providers: IncredibleEmployment.be  This test is not yet approved or cleared by the [place] FDA and has been authorized for detection and/or diagnosis of SARS-CoV-2 by FDA under an Emergency Use Authorization (EUA). This EUA will remain in effect (meaning this test can be used)  for the duration of the COVID-19 declaration under Section 564(b)(1) of the Act, 21 U.S.C. section 360bbb-3(b)(1), unless the authorization is terminated or revoked.     Resp Syncytial Virus by PCR NEGATIVE NEGATIVE Final    Comment: (NOTE) Fact Sheet for Patients: EntrepreneurPulse.com.au  Fact Sheet for Healthcare Providers: IncredibleEmployment.be  This test is not yet approved or cleared by the [place] FDA and has been authorized for detection and/or diagnosis of SARS-CoV-2 by FDA under an Emergency Use Authorization (EUA). This EUA will remain in effect (meaning this test can be used) for the duration of the COVID-19 declaration under Section 564(b)(1) of the Act, 21 U.S.C. section 360bbb-3(b)(1), unless the authorization is  terminated or revoked.  Performed at [facility], [place] [address], [place], [place] [address]      Radiology Studies: No results found.  Scheduled Meds:  amLODipine  5 mg Oral Daily   ARIPiprazole  10 mg Oral Daily   diclofenac  1 patch Transdermal BID   docusate sodium  100 mg Oral BID   enoxaparin (LOVENOX) injection  60 mg Subcutaneous Q24H   FLUoxetine  40 mg Oral Daily   furosemide  40 mg Oral Daily   gabapentin  800 mg Oral TID   lisinopril  10 mg Oral q AM   mometasone-formoterol  2 puff Inhalation BID   pantoprazole  40 mg Oral Daily   prazosin  1 mg Oral QHS   ramelteon  8 mg Oral QHS   senna  1 tablet Oral Daily   Continuous Infusions:   LOS: 0 days   Flora Lipps, MD [facility] 07/23/2022, 2:01 PM

## 2022-07-24 DIAGNOSIS — F1920 Other psychoactive substance dependence, uncomplicated: Secondary | ICD-10-CM

## 2022-07-24 DIAGNOSIS — F1120 Opioid dependence, uncomplicated: Secondary | ICD-10-CM | POA: Diagnosis not present

## 2022-07-24 DIAGNOSIS — R45851 Suicidal ideations: Secondary | ICD-10-CM | POA: Diagnosis not present

## 2022-07-24 DIAGNOSIS — F1414 Cocaine abuse with cocaine-induced mood disorder: Secondary | ICD-10-CM | POA: Diagnosis not present

## 2022-07-24 DIAGNOSIS — F319 Bipolar disorder, unspecified: Secondary | ICD-10-CM | POA: Diagnosis not present

## 2022-07-24 DIAGNOSIS — F3175 Bipolar disorder, in partial remission, most recent episode depressed: Secondary | ICD-10-CM | POA: Diagnosis not present

## 2022-07-24 DIAGNOSIS — J441 Chronic obstructive pulmonary disease with (acute) exacerbation: Secondary | ICD-10-CM | POA: Diagnosis not present

## 2022-07-24 LAB — CREATININE, SERUM
Creatinine, Ser: 0.67 mg/dL (ref 0.44–1.00)
GFR, Estimated: >60 mL/min (ref >60)

## 2022-07-24 MED ORDER — TRAZODONE HCL 100 MG PO TABS
100.0000 mg | ORAL_TABLET | Freq: Every day | ORAL | Status: DC
  Administered 2022-07-24 – 2022-07-25 (×2): 100 mg via ORAL
  Filled 2022-07-24 (×2): qty 1

## 2022-07-24 NOTE — Progress Notes (Signed)
Chaplain engaged in an initial visit with Sarah Cervantes.  Chaplain checked in to see how Sarah Cervantes was feeling today.  She voiced that her leg was feeling better but that her mental and emotional health was not in a good place.  Sarah Cervantes noted that she doesn't have a therapist/counselor outside of the hospital.  Sarah Cervantes will look into getting resources for her.  Sarah Cervantes also noted a desire to be in inpatient behavioral health if possible.  Chaplain will look into resources.   Chaplain offered prayer over Sarah Cervantes, provided a Bible to her, and gave her some items for color therapy, therapeutic writing, and a stress ball.    Chaplain offered a compassionate presence, listening, and support.  Chaplain is available to follow-up as needed.  Chaplain Rihanna Marseille, MDiv  07/24/22 0900  Spiritual Encounters  Type of Visit Initial  Care provided to: Patient  Reason for visit Routine spiritual support  Spiritual Framework  Presenting Themes Courage hope and growth;Community and relationships;Rituals and practive  Community/Connection None  Patient Stress Factors Loss of control;Major life changes;Health changes  Interventions  Spiritual Care Interventions Made Prayer;Encouragement;Compassionate presence;Established relationship of care and support  Intervention Outcomes  Outcomes Awareness of support;Connection to spiritual care

## 2022-07-24 NOTE — Progress Notes (Signed)
PT refused CPAP.  

## 2022-07-24 NOTE — Progress Notes (Signed)
OT Cancellation Note  Patient Details Name: Sarah Cervantes MRN: [id number] DOB: [birth year]   Cancelled Treatment:    Reason Eval/Treat Not Completed: Patient declined, no reason specified Patient was approached this AM with patient declining reporting she "needed to watch her show". Patient was educated on OT role in continuum of care and benefits of participation. Patient agreed to participate in session around 1pm today. Patient was approached at agreed upon time with patient refusing to participate reporting " I am independent". OT to continue to follow and check back as schedule will allow.  [name], [facility] Office# [phone]  07/24/2022, 12:58 PM
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
## 2022-07-24 NOTE — Plan of Care (Signed)
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
## 2022-07-25 DIAGNOSIS — F3175 Bipolar disorder, in partial remission, most recent episode depressed: Secondary | ICD-10-CM | POA: Diagnosis not present

## 2022-07-25 DIAGNOSIS — F1120 Opioid dependence, uncomplicated: Secondary | ICD-10-CM | POA: Diagnosis not present

## 2022-07-25 DIAGNOSIS — J441 Chronic obstructive pulmonary disease with (acute) exacerbation: Secondary | ICD-10-CM | POA: Diagnosis not present

## 2022-07-25 DIAGNOSIS — F1910 Other psychoactive substance abuse, uncomplicated: Secondary | ICD-10-CM | POA: Diagnosis not present

## 2022-07-25 LAB — CBC
HCT: 41.9 % (ref 36.0–46.0)
Hemoglobin: 13.1 g/dL (ref 12.0–15.0)
MCH: 29.7 pg (ref 26.0–34.0)
MCHC: 31.3 g/dL (ref 30.0–36.0)
MCV: 95 fL (ref 80.0–100.0)
Platelets: 331 10*3/uL (ref 150–400)
RBC: 4.41 MIL/uL (ref 3.87–5.11)
RDW: 15.9 % — ABNORMAL HIGH (ref 11.5–15.5)
WBC: 7.3 10*3/uL (ref 4.0–10.5)
nRBC: 0 % (ref 0.0–0.2)

## 2022-07-25 LAB — BASIC METABOLIC PANEL
Anion gap: 7 (ref 5–15)
BUN: 24 mg/dL — ABNORMAL HIGH (ref 6–20)
CO2: 29 mmol/L (ref 22–32)
Calcium: 8.6 mg/dL — ABNORMAL LOW (ref 8.9–10.3)
Chloride: 100 mmol/L (ref 98–111)
Creatinine, Ser: 0.85 mg/dL (ref 0.44–1.00)
GFR, Estimated: >60 mL/min (ref >60)
Glucose, Bld: 96 mg/dL (ref 70–99)
Potassium: 4.4 mmol/L (ref 3.5–5.1)
Sodium: 136 mmol/L (ref 135–145)

## 2022-07-25 LAB — MAGNESIUM: Magnesium: 2.2 mg/dL (ref 1.7–2.4)

## 2022-07-25 MED ORDER — DICLOFENAC EPOLAMINE 1.3 % EX PTCH: 1.0000 | MEDICATED_PATCH | Freq: Two times a day (BID) | CUTANEOUS | 0 refills | Status: DC

## 2022-07-25 MED ORDER — PRAZOSIN HCL 1 MG PO CAPS: 1.0000 mg | ORAL_CAPSULE | Freq: Every day | ORAL | 2 refills | Status: DC

## 2022-07-25 MED ORDER — GABAPENTIN 400 MG PO CAPS: 800.0000 mg | ORAL_CAPSULE | Freq: Three times a day (TID) | ORAL | 2 refills | Status: DC

## 2022-07-25 MED ORDER — ARIPIPRAZOLE 10 MG PO TABS: 10.0000 mg | ORAL_TABLET | Freq: Every day | ORAL | 2 refills | Status: DC

## 2022-07-25 MED ORDER — FUROSEMIDE 40 MG PO TABS: 40.0000 mg | ORAL_TABLET | Freq: Every day | ORAL | 2 refills | Status: DC

## 2022-07-25 MED ORDER — OXYCODONE HCL 5 MG PO TABS: 5.0000 mg | ORAL_TABLET | Freq: Four times a day (QID) | ORAL | 0 refills | Status: DC | PRN

## 2022-07-25 MED ORDER — IBUPROFEN 200 MG PO TABS: 400.0000 mg | ORAL_TABLET | Freq: Three times a day (TID) | ORAL | 0 refills | Status: DC | PRN

## 2022-07-25 MED ORDER — DICLOFENAC SODIUM 1 % EX GEL: 2.0000 g | Freq: Four times a day (QID) | CUTANEOUS | 0 refills | Status: DC

## 2022-07-25 MED ORDER — LISINOPRIL 10 MG PO TABS: 10.0000 mg | ORAL_TABLET | Freq: Every morning | ORAL | 2 refills | Status: DC

## 2022-07-25 MED ORDER — TRAZODONE HCL 100 MG PO TABS: 100.0000 mg | ORAL_TABLET | Freq: Every day | ORAL | 2 refills | Status: DC

## 2022-07-25 MED ORDER — DICLOFENAC SODIUM 1 % EX GEL
2.0000 g | Freq: Four times a day (QID) | CUTANEOUS | Status: DC
  Administered 2022-07-25 – 2022-07-26 (×3): 2 g via TOPICAL
  Filled 2022-07-25: qty 100

## 2022-07-25 MED ORDER — FLUOXETINE HCL 40 MG PO CAPS: 40.0000 mg | ORAL_CAPSULE | Freq: Every day | ORAL | 2 refills | Status: DC

## 2022-07-25 MED ORDER — AMLODIPINE BESYLATE 5 MG PO TABS: 5.0000 mg | ORAL_TABLET | Freq: Every day | ORAL | 2 refills | Status: DC

## 2022-07-25 MED ORDER — PANTOPRAZOLE SODIUM 40 MG PO TBEC: 40.0000 mg | DELAYED_RELEASE_TABLET | Freq: Every day | ORAL | 2 refills | Status: DC

## 2022-07-25 NOTE — Progress Notes (Signed)
Physical Therapy Treatment Patient Details Name: Sarah Cervantes MRN: [id number] DOB: [birth year] Today's Date: 07/25/2022   History of Present Illness Pt is 42 yo female admitted 07/16/22 with lower extremity pain and swelling after a fall.  Xrays of pelvis, L foot, ankle, knee all negative for acute fracture or dislocation.  Per ED note, cannot rule our ligamentous injury, placed pt in [place], and recommended outpatient ortho. Pt also reported suicidal ideation and now with TTS recommending inpatient psych at d/c.  Pt with history including but not limited to cocaine, heroin, and ETOH abuse, COPD and supposed to be on 2 L [place] but noncompliant, bipolar.    PT Comments    Pt is progressing well with mobility, she ambulated 300' with a RW without loss of balance. SpO2 88% on room air with ambulation, then up to 93% after ~2 minutes seated rest. Instructed pt in seated BLE strengthening exercises. Reviewed pursed lip breathing and energy conservation techniques. PT now recommending pt DC home without f/u PT.     Recommendations for follow up therapy are one component of a multi-disciplinary discharge planning process, led by the attending physician.  Recommendations may be updated based on patient status, additional functional criteria and insurance authorization.  Follow Up Recommendations  No PT follow up     Assistance Recommended at Discharge PRN  Patient can return home with the following Assistance with cooking/housework;Assist for transportation;Help with stairs or ramp for entrance   Equipment Recommendations  Rolling walker (2 wheels)    Recommendations for Other Services       Precautions / Restrictions Precautions Precautions: Fall Required Braces or Orthoses: Other Brace;Knee Immobilizer - Left Knee Immobilizer - Left:  (no orders for KI but in room) Other Brace: No orders but KI was placed in ED. Restrictions Weight Bearing Restrictions: No Other Position/Activity  Restrictions: WBAT     Mobility  Bed Mobility               General bed mobility comments: up on commode bathing self in bathroom with supervision from RN    Transfers Overall transfer level: Needs assistance Equipment used: Rolling walker (2 wheels) Transfers: Sit to/from Stand             General transfer comment: VCs for hand placement    Ambulation/Gait Ambulation/Gait assistance: Supervision Gait Distance (Feet): 300 Feet Assistive device: Rolling walker (2 wheels) Gait Pattern/deviations: Step-to pattern, Decreased stride length, Decreased weight shift to left, Antalgic Gait velocity: decreased     General Gait Details: no loss of balance, SpO2 88% on room air walking, then up to 93% after 2 minutes seated rest. VCs for pursed lip breathing   Stairs             Wheelchair Mobility    Modified Rankin (Stroke Patients Only)       Balance Overall balance assessment: Mild deficits observed, not formally tested Sitting-balance support: No upper extremity supported Sitting balance-Leahy Scale: Normal       Standing balance-Leahy Scale: Fair Standing balance comment: able to stand without UE support; RW for ambulation                            Cognition Arousal/Alertness: Awake/alert Behavior During Therapy: WFL for tasks assessed/performed Overall Cognitive Status: Within Functional Limits for tasks assessed  Exercises General Exercises - Lower Extremity Ankle Circles/Pumps: AROM, Both, 10 reps, Seated Quad Sets:  (2 reps only - pt reports increased discomfort at L medial proximal patella) Long Arc Quad: AAROM, Supine, Both, 10 reps Hip Flexion/Marching: AROM, Both, 5 reps, Seated    General Comments        Pertinent Vitals/Pain Pain Assessment Pain Assessment: No/denies pain Pain Score: 0-No pain    Home Living                          Prior  Function            PT Goals (current goals can now be found in the care plan section) Acute Rehab PT Goals Patient Stated Goal: decrease pain PT Goal Formulation: With patient Time For Goal Achievement: 08/01/22 Potential to Achieve Goals: Good Progress towards PT goals: Progressing toward goals    Frequency    Min 3X/week      PT Plan Discharge plan needs to be updated    Co-evaluation              AM-PAC PT "6 Clicks" Mobility   Outcome Measure  Help needed turning from your back to your side while in a flat bed without using bedrails?: None Help needed moving from lying on your back to sitting on the side of a flat bed without using bedrails?: None Help needed moving to and from a bed to a chair (including a wheelchair)?: None Help needed standing up from a chair using your arms (e.g., wheelchair or bedside chair)?: None Help needed to walk in hospital room?: None Help needed climbing 3-5 steps with a railing? : A Little 6 Click Score: 23    End of Session   Activity Tolerance: Patient tolerated treatment well Patient left: in chair;with call bell/phone within reach;Other (comment) (OT in room) Nurse Communication: Mobility status PT Visit Diagnosis: Other abnormalities of gait and mobility (R26.89);Muscle weakness (generalized) (M62.81)     Time: [id number] PT Time Calculation (min) (ACUTE ONLY): 19 min  Charges:  $Gait Training: 8-22 mins                     Sarah Cervantes PT 07/25/2022  Acute Rehabilitation Services  Office [phone]

## 2022-07-25 NOTE — Progress Notes (Signed)
Occupational Therapy Treatment Patient Details Name: Sarah Cervantes MRN: [id number] DOB: 1980-07-27 Today's Date: 07/25/2022   History of present illness Pt is 42 yo female admitted 07/16/22 with lower extremity pain and swelling after a fall.  Xrays of pelvis, L foot, ankle, knee all negative for acute fracture or dislocation.  Per ED note, cannot rule our ligamentous injury, placed pt in [place], and recommended outpatient ortho. Pt also reported suicidal ideation and now with TTS recommending inpatient psych at d/c.  Pt with history including but not limited to cocaine, heroin, and ETOH abuse, COPD and supposed to be on 2 L [place] but noncompliant, bipolar.   OT comments  Patient has made progress towards goals. Patient reported having showered with nursing earlier today while seated on commode. Patient reported plan was to d/c to friends house who would support her for IADL tasks. Patient was MI for transfer from recliner to commode and back with RW. Patient demonstrated ability to reach perineal area with simulated task in standing. Patient did report sometimes having difficulty reaching anterior perineal area. Patient was educated on toileting buddy. Patient reported she would look into it. Patient's discharge plan remains appropriate at this time. OT will continue to follow acutely.     Recommendations for follow up therapy are one component of a multi-disciplinary discharge planning process, led by the attending physician.  Recommendations may be updated based on patient status, additional functional criteria and insurance authorization.    Follow Up Recommendations  No OT follow up     Assistance Recommended at Discharge Intermittent Supervision/Assistance  Patient can return home with the following  A little help with walking and/or transfers;A little help with bathing/dressing/bathroom;Help with stairs or ramp for entrance;Assist for transportation;Assistance with cooking/housework    Equipment Recommendations  None recommended by OT       Precautions / Restrictions Precautions Precautions: Fall Required Braces or Orthoses: Other Brace;Knee Immobilizer - Left Knee Immobilizer - Left:  (no orders for KI but in room) Other Brace: No orders but KI was placed in ED. Restrictions Weight Bearing Restrictions: No Other Position/Activity Restrictions: WBAT       Mobility Bed Mobility               General bed mobility comments: patient was up in hallway with PT at start of session and in recliner at end of session.        Balance Overall balance assessment: Mild deficits observed, not formally tested             ADL either performed or assessed with clinical judgement   ADL Overall ADL's : Needs assistance/impaired       Lower Body Dressing Details (indicate cue type and reason): patient was able to demonstrate figure four positioning sitting in recliner to don/doff socks. patient reported that she had pain medications in system and they impacted her ability to participate in positioning. patient was educated on using total hip kit that was provided during previous admission with patient reporting she would look for it at home. Toilet Transfer: Supervision/safety;Rolling walker (2 wheels) Toilet Transfer Details (indicate cue type and reason): patient attempted to complete without RW with patient educated on how much better mobility looked with walker. patient endorsed that it felt better on her leg to participate in functional mobility with RW. Toileting- Clothing Manipulation and Hygiene: Supervision/safety;Sit to/from stand Toileting - Clothing Manipulation Details (indicate cue type and reason): patient reported some times it was more difficult to reach front of perineal  area with soft tissue mass getting in way. patient was shown the toileting buddy. patient verbalized understanding. and reported she would look into it if she needed it.               Cognition Arousal/Alertness: Awake/alert Behavior During Therapy: WFL for tasks assessed/performed Overall Cognitive Status: Within Functional Limits for tasks assessed                     Pertinent Vitals/ Pain       Pain Assessment Pain Assessment: No/denies pain         Frequency  Min 2X/week        Progress Toward Goals  OT Goals(current goals can now be found in the care plan section)  Progress towards OT goals: Progressing toward goals     Plan Discharge plan remains appropriate       AM-PAC OT "6 Clicks" Daily Activity     Outcome Measure   Help from another person eating meals?: None Help from another person taking care of personal grooming?: A Little Help from another person toileting, which includes using toliet, bedpan, or urinal?: A Little Help from another person bathing (including washing, rinsing, drying)?: A Little Help from another person to put on and taking off regular upper body clothing?: A Little Help from another person to put on and taking off regular lower body clothing?: A Little 6 Click Score: 19    End of Session Equipment Utilized During Treatment: Gait belt;Rolling walker (2 wheels)  OT Visit Diagnosis: Pain Pain - Right/Left: Left Pain - part of body: Knee   Activity Tolerance Patient tolerated treatment well   Patient Left in chair;with call bell/phone within reach   Nurse Communication          Time: 1250-1311 OT Time Calculation (min): 21 min  Charges: OT General Charges $OT Visit: 1 Visit OT Treatments $Self Care/Home Management : 8-22 mins  [name], MS Acute Rehabilitation Department Office# [phone]   Willa Rough 07/25/2022, 2:27 PM

## 2022-07-25 NOTE — Discharge Summary (Addendum)
Physician Discharge Summary  Bopha Falu QVZ:[id number] DOB: 1981-02-28 DOA: 07/16/2022  PCP: Patient, No Pcp Per  Admit date: 07/16/2022 Discharge date: 07/26/2022  Admitted From: Home  Discharge disposition: Home with home health   Recommendations for Outpatient Follow-Up:   Follow up with your primary care provider in one week.  Check CBC, BMP, magnesium in the next visit Follow-up with orthopedic surgery in 1 week. Follow-up with your psychiatrist as outpatient in 1 to 2 weeks.  Discharge Diagnosis:   Principal Problem:   COPD with acute exacerbation (HCC) Active Problems:   Polysubstance dependence including opioid drug with daily use (HCC)   Cocaine abuse with cocaine-induced mood disorder (HCC)   PTSD (post-traumatic stress disorder)   Substance induced mood disorder (HCC)   Polysubstance abuse (HCC)   Affective psychosis, bipolar (HCC)   Suicidal ideation   Discharge Condition: Improved.  Diet recommendation:  Regular.  Wound care: None.  Code status: Full.   History of Present Illness:   Sarah Cervantes is a 42 y.o. female with medical history significant for daily cocaine abuse, alcohol abuse,  bipolar 1 disorder, previous suicide attempts by overdose and self-harm, as well as COPD supposed to be on 2 L nasal cannula oxygen chronically but done not being compliant was admitted to the hospital with complaints of knee pain hip and back pain after falling 2 days back.  She was evaluated after this fall in the emergency department, discharged from the hospital as workup was negative.  Patient however presented back to the hospital for ongoing discomfort and suicidal ideation.  Patient was then seen by psychiatry, endorsed suicidal ideation with a plan to overdose on medications.  Patient had also been engaged in superficial cutting, about 2 weeks after presentation.  Patient is at risk for homelessness, currently staying in a motel often stays with friends     Hospital Course:   Following conditions were addressed during hospitalization as listed below,  COPD with chronic hypoxic respiratory failure:   Patient is supposed to use 3 L of oxygen at home but she was noncompliant.   Recent chest x-ray with enlarged cardiac silhouette no acute process.  Patient was emphasized the need for being compliant with her oxygen regimen at home.   sleep apnea:  Encourage CPAP but has been refusing during hospitalization..   Essential hypertension:  Continue amlodipine and prazosin.Marland Kitchen   GERD: Continue PPI   Polysubstance abuse: Positive for cocaine.  Psychiatry consulted.  Medications were adjusted.   Suicidal ideation/psychosis/bipolar disorder:  Continue Abilify, Prozac, Neurontin and trazodone.  Psychiatry has recommended no inpatient psych admission today.  Doses of Abilify was increased and trazodone was reinitiated.  Medications were adjusted during hospitalization.  Left knee pain/generalized weakness:  Patient states that she fell/tripped on her dog before coming here and has been having left knee pain.  X-ray are negative.  Better with diclofenac patch oxycodone Neurontin Tylenol and Motrin.   Physical therapy on discharge.   Morbid obesity: Body mass index is 47.19 kg/m.  Patient would benefit from weight loss as outpatient.    Disposition.  At this time, patient is stable for disposition home with home health services.  Medical Consultants:   Psychiatry  Procedures:    None Subjective:   Today, patient was seen and examined at bedside.  Complains of mild knee pain.  Otherwise eating okay no shortness of breath chest pain.  Discharge Exam:   Vitals:   07/26/22 0906 07/26/22 0915  BP:  113/66  Pulse:  80  Resp:    Temp:  98.1 F (36.7 C)  SpO2: 98% 97%   Vitals:   07/25/22 2114 07/26/22 0446 07/26/22 0906 07/26/22 0915  BP: 137/63 111/62  113/66  Pulse: 84 72  80  Resp: 20 18    Temp: 98 F (36.7 C) 98.3 F (36.8 C)   98.1 F (36.7 C)  TempSrc: Oral Oral  Oral  SpO2: 94% 96% 98% 97%  Weight:      Height:        General: Alert awake, not in obvious distress, morbidly obese, on 3 L of oxygen by nasal cannula HENT: pupils equally reacting to light,  No scleral pallor or icterus noted. Oral mucosa is moist.  Chest:  Diminished breath sounds bilaterally. No crackles or wheezes.  CVS: S1 &S2 heard. No murmur.  Regular rate and rhythm. Abdomen: Soft, nontender, nondistended.  Bowel sounds are heard.   Extremities: No cyanosis, clubbing or edema.  Peripheral pulses are palpable. Psych: Alert, awake and oriented, mildly anxious CNS:  No cranial nerve deficits.  Power equal in all extremities.   Skin: Warm and dry.  No rashes noted.  The results of significant diagnostics from this hospitalization (including imaging, microbiology, ancillary and laboratory) are listed below for reference.     Diagnostic Studies:   DG Foot Complete Left  Result Date: 07/16/2022 CLINICAL DATA:  Ankle x-ray EXAM: LEFT FOOT - COMPLETE 3+ VIEW COMPARISON:  None Available. FINDINGS: No fracture or dislocation of mid foot or forefoot. The phalanges are normal. The calcaneus is normal. No soft tissue abnormality. Enthesopathic spurring along the plantar aspect of the calcaneus. No fracture at the base of the fifth metatarsal IMPRESSION: No fracture or dislocation. Electronically Signed   By: Genevive Bi M.D.   On: 07/16/2022 17:35   VAS [place] LOWER EXTREMITY VENOUS (DVT) (ONLY [facility] & [facility])  Result Date: 07/16/2022  Lower Venous DVT Study Patient Name:  JELITZA OHLE  Date of Exam:   07/16/2022 Medical Rec #: [id number]          Accession #:    [id number] Date of Birth: 08-03-1980          Patient Gender: F Patient Age:   54 years Exam Location:  [facility] Procedure:      VAS [place] LOWER EXTREMITY VENOUS (DVT) Referring Phys: Ivin Booty GEIPLE --------------------------------------------------------------------------------   Indications: Swelling.  Risk Factors: None identified. Limitations: Body habitus, poor ultrasound/tissue interface and poor patient cooperation, patient positioning, patient pain tolerance. Comparison Study: No prior studies. Performing Technologist: Chanda Busing RVT  Examination Guidelines: A complete evaluation includes B-mode imaging, spectral Doppler, color Doppler, and power Doppler as needed of all accessible portions of each vessel. Bilateral testing is considered an integral part of a complete examination. Limited examinations for reoccurring indications may be performed as noted. The reflux portion of the exam is performed with the patient in reverse Trendelenburg.  +---------+---------------+---------+-----------+----------+-------------------+ RIGHT    CompressibilityPhasicitySpontaneityPropertiesThrombus Aging      +---------+---------------+---------+-----------+----------+-------------------+ CFV      Full           Yes      Yes                                      +---------+---------------+---------+-----------+----------+-------------------+ SFJ      Full                                                             +---------+---------------+---------+-----------+----------+-------------------+  FV Prox  Full                                                             +---------+---------------+---------+-----------+----------+-------------------+ FV Mid   Full                                                             +---------+---------------+---------+-----------+----------+-------------------+ FV Distal               Yes      Yes                                      +---------+---------------+---------+-----------+----------+-------------------+ PFV      Full                                                             +---------+---------------+---------+-----------+----------+-------------------+ POP      Full           Yes      Yes                                       +---------+---------------+---------+-----------+----------+-------------------+ PTV      Full                                                             +---------+---------------+---------+-----------+----------+-------------------+ PERO                                                  Not well visualized +---------+---------------+---------+-----------+----------+-------------------+   +---------+---------------+---------+-----------+----------+-------------------+ LEFT     CompressibilityPhasicitySpontaneityPropertiesThrombus Aging      +---------+---------------+---------+-----------+----------+-------------------+ CFV      Full           Yes      Yes                                      +---------+---------------+---------+-----------+----------+-------------------+ SFJ      Full                                                             +---------+---------------+---------+-----------+----------+-------------------+ FV Prox  Full                                                             +---------+---------------+---------+-----------+----------+-------------------+  FV Mid   Full           Yes      Yes                                      +---------+---------------+---------+-----------+----------+-------------------+ FV Distal               Yes      Yes                                      +---------+---------------+---------+-----------+----------+-------------------+ PFV      Full                                                             +---------+---------------+---------+-----------+----------+-------------------+ POP      Full           Yes      Yes                                      +---------+---------------+---------+-----------+----------+-------------------+ PTV                                                   Not well visualized  +---------+---------------+---------+-----------+----------+-------------------+ PERO                                                  Not well visualized +---------+---------------+---------+-----------+----------+-------------------+     Summary: RIGHT: - There is no evidence of deep vein thrombosis in the lower extremity. However, portions of this examination were limited- see technologist comments above.  - No cystic structure found in the popliteal fossa.  LEFT: - There is no evidence of deep vein thrombosis in the lower extremity. However, portions of this examination were limited- see technologist comments above.  - No cystic structure found in the popliteal fossa.  *See table(s) above for measurements and observations. Electronically signed by Waverly Ferrari MD on 07/16/2022 at 5:35:16 PM.    Final    DG Ankle Complete Left  Result Date: 07/16/2022 CLINICAL DATA:  Ankle pain status fall 2 days ago. Technologist notes best obtainable images. EXAM: LEFT ANKLE COMPLETE - 3+ VIEW COMPARISON:  None Available. FINDINGS: Suboptimal positioning on AP view. There is no evidence of dislocation or joint effusion. Cortical irregularity of the base of the fifth toe metatarsal, seen on oblique view only. Moderate posterior and small plantar calcaneal enthesophytes. Mild osteoarthritis of the tibiotalar joint. Soft tissues are unremarkable. IMPRESSION: Cortical irregularity noted at the base of the fifth toe metatarsal, seen on oblique view only. Recommend correlation for point tenderness and dedicated left foot radiographs for further evaluation. No acute osseous abnormality of the left ankle. Electronically Signed   By: Sherron Ales M.D.   On: 07/16/2022 16:39   DG Pelvis  1-2 Views  Result Date: 07/16/2022 CLINICAL DATA:  Larey Seat 2 days ago, pain EXAM: PELVIS - 1-2 VIEW COMPARISON:  07/30/2021 FINDINGS: Frontal view of the pelvis includes both hips. No fracture, subluxation, or dislocation. Joint spaces  are well preserved. Soft tissues are unremarkable. IMPRESSION: 1. Unremarkable pelvis. Electronically Signed   By: Sharlet Salina M.D.   On: 07/16/2022 15:18   DG Knee Complete 4 Views Left  Result Date: 07/16/2022 CLINICAL DATA:  Larey Seat 2 days ago, pain EXAM: LEFT KNEE - COMPLETE 4+ VIEW COMPARISON:  04/11/2018 FINDINGS: Frontal, bilateral oblique, and cross-table lateral views of the left knee are obtained. No acute fracture, subluxation, or dislocation. 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 No joint effusion. Soft tissues are unremarkable. IMPRESSION: 1. Stable osteoarthritis.  No acute fracture. Electronically Signed   By: Sharlet Salina M.D.   On: 07/16/2022 15:17   DG Chest Portable 1 View  Result Date: 07/16/2022 CLINICAL DATA:  Larey Seat 2 days ago, short of breath, COPD EXAM: PORTABLE CHEST 1 VIEW COMPARISON:  05/27/2022 FINDINGS: Single frontal view of the chest demonstrates stable enlargement of the cardiac silhouette. Lung volumes are diminished with crowding the central vasculature. No airspace disease, effusion, or pneumothorax. No acute fracture. IMPRESSION: 1. Enlarged cardiac silhouette. 2. Low lung volumes.  No acute process. Electronically Signed   By: Sharlet Salina M.D.   On: 07/16/2022 15:16     Labs:   Basic Metabolic Panel: Recent Labs  Lab 07/22/22 0633 07/24/22 0436 07/25/22 0555  NA 134*  --  136  K 4.2  --  4.4  CL 98  --  100  CO2 29  --  29  GLUCOSE 129*  --  96  BUN 15  --  24*  CREATININE 0.78 0.67 0.85  CALCIUM 8.5*  --  8.6*  MG 2.2  --  2.2   GFR Estimated Creatinine Clearance: 111.4 mL/min (by C-G formula based on SCr of 0.85 mg/dL). Liver Function Tests: No results for input(s): "AST", "ALT", "ALKPHOS", "BILITOT", "PROT", "ALBUMIN" in the last 168 hours. No results for input(s): "LIPASE", "AMYLASE" in the last 168 hours. No results for input(s): "AMMONIA" in the last 168 hours. Coagulation profile No results  for input(s): "INR", "PROTIME" in the last 168 hours.  CBC: Recent Labs  Lab 07/22/22 0633 07/25/22 0555  WBC 7.0 7.3  HGB 12.8 13.1  HCT 41.9 41.9  MCV 95.0 95.0  PLT 328 331   Cardiac Enzymes: No results for input(s): "CKTOTAL", "CKMB", "CKMBINDEX", "TROPONINI" in the last 168 hours. BNP: Invalid input(s): "POCBNP" CBG: No results for input(s): "GLUCAP" in the last 168 hours. D-Dimer No results for input(s): "DDIMER" in the last 72 hours. Hgb A1c No results for input(s): "HGBA1C" in the last 72 hours. Lipid Profile No results for input(s): "CHOL", "HDL", "LDLCALC", "TRIG", "CHOLHDL", "LDLDIRECT" in the last 72 hours. Thyroid function studies No results for input(s): "TSH", "T4TOTAL", "T3FREE", "THYROIDAB" in the last 72 hours.  Invalid input(s): "FREET3" Anemia work up No results for input(s): "VITAMINB12", "FOLATE", "FERRITIN", "TIBC", "IRON", "RETICCTPCT" in the last 72 hours. Microbiology Recent Results (from the past 240 hour(s))  Blood Culture (routine x 2)     Status: None   Collection Time: 07/16/22  1:20 PM   Specimen: BLOOD  Result Value Ref Range Status   Specimen Description   Final    BLOOD RIGHT ANTECUBITAL Performed at [facility], [address] [place], [place] [address]    Special  Requests   Final    BOTTLES DRAWN AEROBIC AND ANAEROBIC Blood Culture adequate volume Performed at [facility], [address], [place], [place] [address]    Culture   Final    NO GROWTH 5 DAYS Performed at [facility], [address], [place], [place] [address]    Report Status 07/21/2022 FINAL  Final  Blood Culture (routine x 2)     Status: None   Collection Time: 07/16/22  1:25 PM   Specimen: BLOOD  Result Value Ref Range Status   Specimen Description   Final    BLOOD SITE NOT SPECIFIED Performed at [facility], [address], [place], [place] [address]    Special Requests   Final    BOTTLES  DRAWN AEROBIC AND ANAEROBIC Blood Culture adequate volume Performed at [facility], [address], [place], [place] [address]    Culture   Final    NO GROWTH 5 DAYS Performed at [facility], [address], [place], [place] [address]    Report Status 07/21/2022 FINAL  Final  Resp panel by RT-PCR (RSV, Flu A&B, Covid) Anterior Nasal Swab     Status: None   Collection Time: 07/16/22  7:16 PM   Specimen: Anterior Nasal Swab  Result Value Ref Range Status   SARS Coronavirus 2 by RT PCR NEGATIVE NEGATIVE Final    Comment: (NOTE) SARS-CoV-2 target nucleic acids are NOT DETECTED.  The SARS-CoV-2 RNA is generally detectable in upper respiratory specimens during the acute phase of infection. The lowest concentration of SARS-CoV-2 viral copies this assay can detect is 138 copies/mL. A negative result does not preclude SARS-Cov-2 infection and should not be used as the sole basis for treatment or other patient management decisions. A negative result may occur with  improper specimen collection/handling, submission of specimen other than nasopharyngeal swab, presence of viral mutation(s) within the areas targeted by this assay, and inadequate number of viral copies(<138 copies/mL). A negative result must be combined with clinical observations, patient history, and epidemiological information. The expected result is Negative.  Fact Sheet for Patients:  BloggerCourse.com  Fact Sheet for Healthcare Providers:  SeriousBroker.it  This test is no t yet approved or cleared by the [place] FDA and  has been authorized for detection and/or diagnosis of SARS-CoV-2 by FDA under an Emergency Use Authorization (EUA). This EUA will remain  in effect (meaning this test can be used) for the duration of the COVID-19 declaration under Section 564(b)(1) of the Act, 21 U.S.C.section 360bbb-3(b)(1), unless the authorization is  terminated  or revoked sooner.       Influenza A by PCR NEGATIVE NEGATIVE Final   Influenza B by PCR NEGATIVE NEGATIVE Final    Comment: (NOTE) The Xpert Xpress SARS-CoV-2/FLU/RSV plus assay is intended as an aid in the diagnosis of influenza from Nasopharyngeal swab specimens and should not be used as a sole basis for treatment. Nasal washings and aspirates are unacceptable for Xpert Xpress SARS-CoV-2/FLU/RSV testing.  Fact Sheet for Patients: BloggerCourse.com  Fact Sheet for Healthcare Providers: SeriousBroker.it  This test is not yet approved or cleared by the [place] FDA and has been authorized for detection and/or diagnosis of SARS-CoV-2 by FDA under an Emergency Use Authorization (EUA). This EUA will remain in effect (meaning this test can be used) for the duration of the COVID-19 declaration under Section 564(b)(1) of the Act, 21 U.S.C. section 360bbb-3(b)(1), unless the authorization is terminated or revoked.  Resp Syncytial Virus by PCR NEGATIVE NEGATIVE Final    Comment: (NOTE) Fact Sheet for Patients: BloggerCourse.com  Fact Sheet for Healthcare Providers: SeriousBroker.it  This test is not yet approved or cleared by the [place] FDA and has been authorized for detection and/or diagnosis of SARS-CoV-2 by FDA under an Emergency Use Authorization (EUA). This EUA will remain in effect (meaning this test can be used) for the duration of the COVID-19 declaration under Section 564(b)(1) of the Act, 21 U.S.C. section 360bbb-3(b)(1), unless the authorization is terminated or revoked.  Performed at [facility], [address], [place], [place] [address]      Discharge Instructions:   Discharge Instructions     Call MD for:  severe uncontrolled pain   Complete by: As directed    Diet general   Complete by: As directed     Discharge instructions   Complete by: As directed    Follow-up with your primary care provider in 1 week.  Take medications as prescribed.  Seek medical attention for worsening symptoms.  Follow-up with your psychiatrist as outpatient.  Continue to use CPAP and oxygen at home.  Please do not use illicit substances or smoke..  Continue physical therapy at home.  Follow-up with orthopedic surgery in 1 week.   Increase activity slowly   Complete by: As directed       Allergies as of 07/26/2022       Reactions   Latex Swelling, Rash, Other (See Comments)   Hands swell        Medication List     TAKE these medications    acetaminophen 325 MG tablet Commonly known as: TYLENOL Take 2 tablets (650 mg total) by mouth every 6 (six) hours as needed for mild pain (or Fever >/= 101).   albuterol 108 (90 Base) MCG/ACT inhaler Commonly known as: VENTOLIN HFA Inhale 2 puffs into the lungs every 6 (six) hours as needed for wheezing or shortness of breath.   amLODipine 5 MG tablet Commonly known as: NORVASC Take 1 tablet (5 mg total) by mouth daily.   ARIPiprazole 10 MG tablet Commonly known as: ABILIFY Take 1 tablet (10 mg total) by mouth daily. What changed: Another medication with the same name was removed. Continue taking this medication, and follow the directions you see here.   budesonide-formoterol 80-4.5 MCG/ACT inhaler Commonly known as: SYMBICORT Inhale 2 puffs into the lungs 2 (two) times daily.   butalbital-acetaminophen-caffeine 50-325-40 MG tablet Commonly known as: FIORICET Take 1 tablet by mouth every 4 (four) hours as needed for headache.   diclofenac 1.3 % Ptch Commonly known as: FLECTOR Place 1 patch onto the skin 2 (two) times daily for 10 days.   FLUoxetine 40 MG capsule Commonly known as: PROZAC Take 1 capsule (40 mg total) by mouth daily.   furosemide 40 MG tablet Commonly known as: Lasix Take 1 tablet (40 mg total) by mouth daily.   gabapentin 400 MG  capsule Commonly known as: NEURONTIN Take 2 capsules (800 mg total) by mouth 3 (three) times daily.   hydrocortisone 2.5 % rectal cream Commonly known as: ANUSOL-HC Place rectally 3 (three) times daily. What changed:  how much to take when to take this reasons to take this   hydrOXYzine 50 MG tablet Commonly known as: ATARAX Take 1 tablet (50 mg total) by mouth 3 (three) times daily as needed for anxiety.   ibuprofen 200 MG tablet Commonly known as: ADVIL Take 2 tablets (400 mg total)  by mouth every 8 (eight) hours as needed for headache or mild pain.   lidocaine 5 % Commonly known as: LIDODERM Place 1 patch onto the skin daily. Remove & Discard patch within 12 hours or as directed by MD What changed: how much to take   lisinopril 10 MG tablet Commonly known as: ZESTRIL Take 1 tablet (10 mg total) by mouth in the morning.   nicotine polacrilex 2 MG gum Commonly known as: NICORETTE Take 1 each (2 mg total) by mouth as needed for smoking cessation.   oxyCODONE 5 MG immediate release tablet Commonly known as: Oxy IR/ROXICODONE Take 1 tablet (5 mg total) by mouth every 6 (six) hours as needed for up to 5 days for severe pain.   Ozempic (0.25 or 0.5 MG/DOSE) 2 MG/1.5ML Sopn Generic drug: Semaglutide(0.25 or 0.5MG /DOS) Inject 0.5 mg into the skin every Monday.   pantoprazole 40 MG tablet Commonly known as: PROTONIX Take 1 tablet (40 mg total) by mouth daily.   prazosin 1 MG capsule Commonly known as: MINIPRESS Take 1 capsule (1 mg total) by mouth at bedtime.   predniSONE 10 MG tablet Commonly known as: DELTASONE Take 2 tablets (20 mg total) by mouth daily.   senna 8.6 MG Tabs tablet Commonly known as: SENOKOT Take 1 tablet (8.6 mg total) by mouth daily.   traZODone 100 MG tablet Commonly known as: DESYREL Take 1 tablet (100 mg total) by mouth at bedtime.               Durable Medical Equipment  (From admission, onward)           Start     Ordered    07/25/22 1512  For home use only DME oxygen  Once       Question Answer Comment  Length of Need 6 Months   Mode or (Route) Nasal cannula   Liters per Minute 4   Frequency Continuous (stationary and portable oxygen unit needed)   Oxygen conserving device Yes   Oxygen delivery system Gas      07/25/22 1512   07/25/22 1316  For home use only DME Walker rolling  Once       Comments: Wide RW  Question Answer Comment  Walker: With 5 Inch Wheels   Patient needs a walker to treat with the following condition Difficulty in walking, not elsewhere classified      07/25/22 1316            Follow-up Information     Tarry Kos, MD In 1 week.   Specialty: Orthopedic Surgery Contact information: [address] [place] [address] [phone]         [facility]. Go on 08/01/2022.   Why: You have an appointment on Tuesday, 08/01/22 at 9:45am w/ Dr. Welton Flakes to establish primary care services. Please arrive 15 minutes early to complete new patient paperwork. Contact information: [address] [place] [address] [phone]        [facility] Follow up.   Why: [facility] to provide ongoing home oxygen Contact information: [address] [place] [address] [phone]                  Time coordinating discharge: 39 minutes  Signed:  Keeli Roberg  [facility] 07/26/2022, 11:12 AM
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
## 2022-07-25 NOTE — NC FL2 (Signed)
[place] LEVEL OF CARE FORM     IDENTIFICATION  Patient Name: Sarah Cervantes Birthdate: [birth year] Sex: female Admission Date (Current Location): 07/16/2022  [facility] and [place] Number:  Herbalist and Address:  [facility],  [place] [address], [place]      Provider Number: [id number]  Attending Physician Name and Address:  Flora Lipps, MD  Relative Name and Phone Number:       Current Level of Care: Hospital Recommended Level of Care: [place] Prior Approval Number:    Date Approved/Denied:   PASRR Number: Pending  Discharge Plan: SNF    Current Diagnoses: Patient Active Problem List   Diagnosis Date Noted   COPD with acute exacerbation ([place]) 07/17/2022   Cocaine use disorder ([place]) 05/26/2022   Bipolar I disorder, most recent episode depressed ([place]) 05/26/2022   Suicidal ideation 05/26/2022   Homelessness 05/26/2022   Insomnia 04/03/2022   Anxiety state 04/03/2022   Affective psychosis, bipolar ([place]) 04/02/2022   Bipolar disorder, curr episode depressed, severe, w/psychotic features ([place]) 04/01/2022   Polysubstance abuse ([place]) 04/01/2022   MDD (major depressive disorder), recurrent episode, severe ([place]) 03/31/2022   Acute respiratory failure with hypoxia ([place]) 03/16/2022   Volume overload 03/16/2022   History of COVID-19 03/16/2022   Near syncope 03/14/2022   Closed fracture of T10 vertebra ([place]) 03/14/2022   Substance induced mood disorder ([place]) 07/16/2021   Constipation 04/13/2018   Bacterial vaginosis 04/13/2018   Bacteremia due to Gram-negative bacteria 03/25/2018   Cocaine abuse with cocaine-induced mood disorder ([place]) 03/24/2018   PTSD (post-traumatic stress disorder) 03/24/2018   Sepsis, Gram negative ([place]) 03/23/2018   Alcohol abuse with intoxication ([place]) 03/23/2018   Polysubstance dependence including opioid drug with daily use ([place]) 03/23/2018    Orientation RESPIRATION BLADDER  Height & Weight     Self, Time, Situation, Place  O2 Continent Weight: 274 lb 14.6 oz (124.7 kg) Height:  '5\' 4"'$  (162.6 cm)  BEHAVIORAL SYMPTOMS/MOOD NEUROLOGICAL BOWEL NUTRITION STATUS      Continent Diet (See discharge summary)  AMBULATORY STATUS COMMUNICATION OF NEEDS Skin   Limited Assist Verbally Normal                       Personal Care Assistance Level of Assistance  Bathing, Feeding, Dressing Bathing Assistance: Limited assistance Feeding assistance: Independent Dressing Assistance: Limited assistance     Functional Limitations Info  Sight, Speech, Hearing Sight Info: Adequate Hearing Info: Adequate Speech Info: Adequate    SPECIAL CARE FACTORS FREQUENCY  PT (By licensed PT), OT (By licensed OT)     PT Frequency: 5x/wk OT Frequency: 5x/wk            Contractures Contractures Info: Not present    Additional Factors Info  Code Status, Allergies, Psychotropic Code Status Info: FULL Allergies Info: Latex Psychotropic Info: See MAR         Current Medications (07/25/2022):  This is the current hospital active medication list Current Facility-Administered Medications  Medication Dose Route Frequency Provider Last Rate Last Admin   acetaminophen (TYLENOL) tablet 650 mg  650 mg Oral Q6H PRN Hollice Gong, Mir M, MD   650 mg at 07/23/22 [id number]   Or   acetaminophen (TYLENOL) suppository 650 mg  650 mg Rectal Q6H PRN Hollice Gong, Mir M, MD       albuterol (PROVENTIL) (2.5 MG/3ML) 0.083% nebulizer solution 2.5 mg  2.5 mg Nebulization Q2H PRN Lucillie Garfinkel, MD  amLODipine (NORVASC) tablet 5 mg  5 mg Oral Daily Regan Lemming, MD   5 mg at 07/25/22 0948   ARIPiprazole (ABILIFY) tablet 10 mg  10 mg Oral Daily Regan Lemming, MD   10 mg at 07/25/22 0944   diclofenac (FLECTOR) 1.3 % 1 patch  1 patch Transdermal BID Pokhrel, Laxman, MD   1 patch at 07/25/22 0951   docusate sodium (COLACE) capsule 100 mg  100 mg Oral BID Hollice Gong, Mir M, MD   100 mg at 07/25/22  0945   enoxaparin (LOVENOX) injection 60 mg  60 mg Subcutaneous Q24H Hollice Gong, Mir M, MD   60 mg at 07/24/22 2117   FLUoxetine (PROZAC) capsule 40 mg  40 mg Oral Daily Regan Lemming, MD   40 mg at 07/25/22 0946   furosemide (LASIX) tablet 40 mg  40 mg Oral Daily Darliss Cheney, MD   40 mg at 07/25/22 0948   gabapentin (NEURONTIN) capsule 800 mg  800 mg Oral TID Regan Lemming, MD   800 mg at 07/25/22 0946   hydrOXYzine (ATARAX) tablet 50 mg  50 mg Oral TID PRN Regan Lemming, MD   50 mg at 07/23/22 2151   ibuprofen (ADVIL) tablet 400 mg  400 mg Oral Q8H PRN Regan Lemming, MD   400 mg at 07/25/22 0737   lip balm (CARMEX) ointment   Topical PRN Pokhrel, Laxman, MD       lisinopril (ZESTRIL) tablet 10 mg  10 mg Oral q AM Regan Lemming, MD   10 mg at 07/25/22 0739   mometasone-formoterol (DULERA) 100-5 MCG/ACT inhaler 2 puff  2 puff Inhalation BID Lucillie Garfinkel, MD   2 puff at 07/25/22 0822   ondansetron (ZOFRAN) tablet 4 mg  4 mg Oral Q6H PRN Lucillie Garfinkel, MD   4 mg at 07/23/22 1038   Or   ondansetron (ZOFRAN) injection 4 mg  4 mg Intravenous Q6H PRN Hollice Gong, Mir M, MD       oxyCODONE (Oxy IR/ROXICODONE) immediate release tablet 10 mg  10 mg Oral Q6H PRN Pokhrel, Laxman, MD   10 mg at 07/25/22 0743   pantoprazole (PROTONIX) EC tablet 40 mg  40 mg Oral Daily Darliss Cheney, MD   40 mg at 07/25/22 0947   polyethylene glycol (MIRALAX / GLYCOLAX) packet 17 g  17 g Oral Daily PRN Hollice Gong, Mir M, MD       prazosin (MINIPRESS) capsule 1 mg  1 mg Oral QHS Darliss Cheney, MD   1 mg at 07/24/22 2119   senna (SENOKOT) tablet 8.6 mg  1 tablet Oral Daily Regan Lemming, MD   8.6 mg at 07/25/22 [id number]   traZODone (DESYREL) tablet 100 mg  100 mg Oral QHS Cinderella, Margaret A   100 mg at 07/24/22 2119   traZODone (DESYREL) tablet 25 mg  25 mg Oral QHS PRN Lucillie Garfinkel, MD   25 mg at 07/23/22 2152     Discharge Medications: Please see discharge summary for a list of discharge  medications.  Relevant Imaging Results:  Relevant Lab Results:   Additional Information SSN: [id number]  Vassie Moselle, LCSW

## 2022-07-25 NOTE — Progress Notes (Signed)
Patient continues to decline nocturnal CPAP. Eduction provided. Equipment remains at bedside. She is aware that she may request assistance at any time should she become come compliant. RT will continue to follow and encourage use.

## 2022-07-25 NOTE — TOC Progression Note (Addendum)
Transition of Care [facility]) - Progression Note    Patient Details  Name: Sarah Cervantes MRN: [id number] Date of Birth: May 19, 1981  Transition of [facility]) CM/SW [place], [place] Phone Number: 07/25/2022, 2:28 PM  Clinical Narrative:    Pt has been psychiatrically cleared and no longer meets criteria for inpt psychiatric treatment. Pt worked with PT and was able to ambulate 360f and no longer recommended for PT follow up.  Met with pt who shares she has a friend who will be allowing her to stay at discharge however, is not able to accept until tomorrow, 2/28. Pt shares with CSW that she has no income, does not receive SNAP benefits, and denies having any DME at home. Pt shares she has never had home O2 before although, chart states pt is supposed to be on 3L O2 at baseline. Pt is agreeable to having wide rolling walker and O2 ordered and delivered to her room. DME has been ordered through [employer] and will be delivered to pt's room once orders and sat note have been placed. Pt shares she does not have a PCP and is agreeable to having appointment scheduled. [facility] at 9:45am. Information added to AVS. Pt has been referred for food assistance and mental health follow up through the [facility]. Resources for substance use, transportation, and financial assistance have been added to AVS.  Update 4:00pm- RW and O2 have been delivered to pt's room by [employer].     Expected Discharge Plan: Psychiatric Hospital Barriers to Discharge: Barriers Resolved  Expected Discharge Plan and Services In-house Referral: Clinical Social Work Discharge Planning Services: CM Consult Post Acute Care Choice: NA Living arrangements for the past 2 months: Hotel/Motel Expected Discharge Date: 07/25/22               DME Arranged: [facility] [employer], Oxygen DME Agency: [employer] Date DME Agency Contacted: 07/25/22 Time DME Agency Contacted:  [phone] spoke with at DME Agency: [facility] [place] Interventions SDOH Screenings   Food Insecurity: No Food Insecurity (07/23/2022)  Housing: Low Risk  (07/23/2022)  Transportation Needs: No Transportation Needs (07/23/2022)  Utilities: Not At Risk (07/23/2022)  Alcohol Screen: Medium Risk (04/10/2018)  Tobacco Use: Medium Risk (07/23/2022)    Readmission Risk Interventions     No data to display

## 2022-07-25 NOTE — Progress Notes (Addendum)
SATURATION QUALIFICATIONS: (This note is used to comply with regulatory documentation for home oxygen)  Patient Saturations on Room Air at Rest = 93%  Patient Saturations on Room Air while Ambulating = 87%  Patient Saturations on 4 Liters of oxygen while Ambulating = 93%  Please briefly explain why patient needs home oxygen:  Patient has COPD and gets short of breath requiring oxygen when she ambulates.   Virginia Rochester, RN

## 2022-07-26 ENCOUNTER — Other Ambulatory Visit (HOSPITAL_COMMUNITY): Payer: Self-pay

## 2022-07-26 MED ORDER — IBUPROFEN 200 MG PO TABS
400.0000 mg | ORAL_TABLET | Freq: Three times a day (TID) | ORAL | 0 refills | Status: DC | PRN
  Filled 2022-07-26: qty 30, 5d supply, fill #0

## 2022-07-26 MED ORDER — FLUOXETINE HCL 40 MG PO CAPS
40.0000 mg | ORAL_CAPSULE | Freq: Every day | ORAL | 2 refills | Status: DC
  Filled 2022-07-26 (×2): qty 30, 30d supply, fill #0

## 2022-07-26 MED ORDER — TRAZODONE HCL 100 MG PO TABS
100.0000 mg | ORAL_TABLET | Freq: Every day | ORAL | 2 refills | Status: DC
  Filled 2022-07-26: qty 30, 30d supply, fill #0

## 2022-07-26 MED ORDER — DICLOFENAC EPOLAMINE 1.3 % EX PTCH
1.0000 | MEDICATED_PATCH | Freq: Two times a day (BID) | CUTANEOUS | 0 refills | Status: DC
  Filled 2022-07-26 (×2): qty 30, 15d supply, fill #0

## 2022-07-26 MED ORDER — GABAPENTIN 400 MG PO CAPS
800.0000 mg | ORAL_CAPSULE | Freq: Three times a day (TID) | ORAL | 2 refills | Status: DC
  Filled 2022-07-26 (×2): qty 180, 30d supply, fill #0

## 2022-07-26 MED ORDER — OXYCODONE HCL 5 MG PO TABS
5.0000 mg | ORAL_TABLET | Freq: Four times a day (QID) | ORAL | 0 refills | Status: AC | PRN
  Filled 2022-07-26: qty 15, 5d supply, fill #0

## 2022-07-26 MED ORDER — FUROSEMIDE 40 MG PO TABS
40.0000 mg | ORAL_TABLET | Freq: Every day | ORAL | 2 refills | Status: DC
  Filled 2022-07-26 (×2): qty 30, 30d supply, fill #0

## 2022-07-26 MED ORDER — PANTOPRAZOLE SODIUM 40 MG PO TBEC
40.0000 mg | DELAYED_RELEASE_TABLET | Freq: Every day | ORAL | 2 refills | Status: DC
  Filled 2022-07-26: qty 30, 30d supply, fill #0

## 2022-07-26 MED ORDER — PRAZOSIN HCL 1 MG PO CAPS
1.0000 mg | ORAL_CAPSULE | Freq: Every day | ORAL | 2 refills | Status: DC
  Filled 2022-07-26: qty 30, 30d supply, fill #0

## 2022-07-26 MED ORDER — AMLODIPINE BESYLATE 5 MG PO TABS
5.0000 mg | ORAL_TABLET | Freq: Every day | ORAL | 2 refills | Status: DC
  Filled 2022-07-26 (×2): qty 30, 30d supply, fill #0

## 2022-07-26 NOTE — Plan of Care (Signed)

## 2022-07-26 NOTE — Progress Notes (Signed)
Patient seen and examined at bedside.  No interval complaints.  Complains of mild knee pain.      07/26/2022    9:15 AM 07/26/2022    4:46 AM 07/25/2022    9:14 PM  Vitals with BMI  Systolic [id number] [id number]  Diastolic 66 62 63  Pulse 80 72 84   Patient is stable for disposition home.  Please refer to discharge instructions from 07/25/2022.

## 2022-07-31 NOTE — Progress Notes (Deleted)
CC: Establishment of Care  HPI:  Ms.Sarah Cervantes is a 42 y.o. female with a past medical history of HTN, HLD, DMII, COPD, MDD, GERD and presenting to the clinic today for establishment of care. Please see problem based assessment and plan for additional details.  Past Medical History:  Diagnosis Date   Cocaine abuse, daily use ([place]) 03/23/2018   ETOH abuse 03/23/2018   Heroin abuse ([place]) 03/23/2018     Current Outpatient Medications (Endocrine & Metabolic):    OZEMPIC, 0.25 OR 0.5 MG/DOSE, 2 MG/1.5ML SOPN, Inject 0.5 mg into the skin every Monday.   predniSONE (DELTASONE) 10 MG tablet, Take 2 tablets (20 mg total) by mouth daily.  Current Outpatient Medications (Cardiovascular):    amLODipine (NORVASC) 5 MG tablet, Take 1 tablet (5 mg total) by mouth daily.   furosemide (LASIX) 40 MG tablet, Take 1 tablet (40 mg total) by mouth daily.   lisinopril (ZESTRIL) 10 MG tablet, Take 1 tablet (10 mg total) by mouth in the morning.   prazosin (MINIPRESS) 1 MG capsule, Take 1 capsule (1 mg total) by mouth at bedtime.  Current Outpatient Medications (Respiratory):    albuterol (VENTOLIN HFA) 108 (90 Base) MCG/ACT inhaler, Inhale 2 puffs into the lungs every 6 (six) hours as needed for wheezing or shortness of breath.   budesonide-formoterol (SYMBICORT) 80-4.5 MCG/ACT inhaler, Inhale 2 puffs into the lungs 2 (two) times daily.  Current Outpatient Medications (Analgesics):    acetaminophen (TYLENOL) 325 MG tablet, Take 2 tablets (650 mg total) by mouth every 6 (six) hours as needed for mild pain (or Fever >/= 101).   butalbital-acetaminophen-caffeine (FIORICET) 50-325-40 MG tablet, Take 1 tablet by mouth every 4 (four) hours as needed for headache.   ibuprofen (ADVIL) 200 MG tablet, Take 2 tablets (400 mg total) by mouth every 8 (eight) hours as needed for headache or mild pain.   oxyCODONE (OXY IR/ROXICODONE) 5 MG immediate release tablet, Take 1 tablet (5 mg total) by mouth every 6 (six)  hours as needed for up to 5 days for severe pain.   Current Outpatient Medications (Other):    ARIPiprazole (ABILIFY) 10 MG tablet, Take 1 tablet (10 mg total) by mouth daily.   diclofenac (FLECTOR) 1.3 % PTCH, Place 1 patch onto the skin 2 (two) times daily for 10 days.   FLUoxetine (PROZAC) 40 MG capsule, Take 1 capsule (40 mg total) by mouth daily.   gabapentin (NEURONTIN) 400 MG capsule, Take 2 capsules (800 mg total) by mouth 3 (three) times daily.   hydrocortisone (ANUSOL-HC) 2.5 % rectal cream, Place rectally 3 (three) times daily. (Patient taking differently: Place 1 Application rectally daily as needed for hemorrhoids.)   hydrOXYzine (ATARAX) 50 MG tablet, Take 1 tablet (50 mg total) by mouth 3 (three) times daily as needed for anxiety.   lidocaine (LIDODERM) 5 %, Place 1 patch onto the skin daily. Remove & Discard patch within 12 hours or as directed by MD (Patient taking differently: Place 2 patches onto the skin daily. Remove & Discard patch within 12 hours or as directed by MD)   nicotine polacrilex (NICORETTE) 2 MG gum, Take 1 each (2 mg total) by mouth as needed for smoking cessation.   pantoprazole (PROTONIX) 40 MG tablet, Take 1 tablet (40 mg total) by mouth daily.   senna (SENOKOT) 8.6 MG TABS tablet, Take 1 tablet (8.6 mg total) by mouth daily.   traZODone (DESYREL) 100 MG tablet, Take 1 tablet (100 mg total) by mouth at bedtime.  No family history on file.  Social History: ***  Review of Systems: ROS negative except for what is noted on the assessment and plan.  There were no vitals filed for this visit.   Physical Exam: General: Well appearing ***, NAD HENT: normocephalic, atraumatic EYES: conjunctiva non-erythematous, no scleral icterus CV: regular rate, normal rhythm, no murmurs, rubs, gallops. Pulmonary: normal work of breathing on RA, lungs clear to auscultation, no rales, wheezes, rhonchi Abdominal: non-distended, soft, non-tender to palpation, normal  BS Skin: Warm and dry, no rashes or lesions Neurological: MS: awake, alert and oriented x3, normal speech and fund of knowledge Motor: moves all extremities antigravity Psych: normal affect    Assessment & Plan:   No problem-specific Assessment & Plan notes found for this encounter.   See Encounters Tab for problem based charting.  Patient {GC/GE:[id number]::"discussed with","seen with"} Dr. {NAMES:[id number]::"Guilloud","Hoffman","Mullen","Narendra","Vincent","Machen","Lau","Hatcher"} Idamae Schuller, MD Tillie Rung. [facility] Internal Medicine Residency, PGY-2   Echo with good EF and diastolic function. Some LV hypertrophy.

## 2022-08-01 ENCOUNTER — Ambulatory Visit: Payer: 59 | Admitting: Internal Medicine

## 2022-08-05 ENCOUNTER — Other Ambulatory Visit: Payer: Self-pay

## 2022-08-05 ENCOUNTER — Emergency Department (HOSPITAL_COMMUNITY): Payer: 59

## 2022-08-05 ENCOUNTER — Inpatient Hospital Stay (HOSPITAL_COMMUNITY)
Admission: EM | Admit: 2022-08-05 | Discharge: 2022-08-16 | DRG: 193 | Disposition: A | Discharge status: Home / Self Care | Payer: 59 | Attending: Internal Medicine | Admitting: Internal Medicine

## 2022-08-05 ENCOUNTER — Encounter (HOSPITAL_COMMUNITY): Payer: Self-pay

## 2022-08-05 DIAGNOSIS — J441 Chronic obstructive pulmonary disease with (acute) exacerbation: Secondary | ICD-10-CM | POA: Diagnosis present

## 2022-08-05 DIAGNOSIS — J189 Pneumonia, unspecified organism: Secondary | ICD-10-CM | POA: Diagnosis not present

## 2022-08-05 DIAGNOSIS — Y900 Blood alcohol level of less than 20 mg/100 ml: Secondary | ICD-10-CM | POA: Diagnosis present

## 2022-08-05 DIAGNOSIS — F1994 Other psychoactive substance use, unspecified with psychoactive substance-induced mood disorder: Secondary | ICD-10-CM | POA: Diagnosis present

## 2022-08-05 DIAGNOSIS — F259 Schizoaffective disorder, unspecified: Secondary | ICD-10-CM | POA: Diagnosis present

## 2022-08-05 DIAGNOSIS — E662 Morbid (severe) obesity with alveolar hypoventilation: Secondary | ICD-10-CM | POA: Diagnosis present

## 2022-08-05 DIAGNOSIS — F1110 Opioid abuse, uncomplicated: Secondary | ICD-10-CM | POA: Diagnosis present

## 2022-08-05 DIAGNOSIS — J440 Chronic obstructive pulmonary disease with acute lower respiratory infection: Secondary | ICD-10-CM | POA: Diagnosis present

## 2022-08-05 DIAGNOSIS — Z6841 Body Mass Index (BMI) 40.0 and over, adult: Secondary | ICD-10-CM

## 2022-08-05 DIAGNOSIS — M542 Cervicalgia: Secondary | ICD-10-CM | POA: Diagnosis present

## 2022-08-05 DIAGNOSIS — K0889 Other specified disorders of teeth and supporting structures: Secondary | ICD-10-CM | POA: Diagnosis present

## 2022-08-05 DIAGNOSIS — Z7952 Long term (current) use of systemic steroids: Secondary | ICD-10-CM

## 2022-08-05 DIAGNOSIS — M25562 Pain in left knee: Secondary | ICD-10-CM | POA: Diagnosis present

## 2022-08-05 DIAGNOSIS — Z91128 Patient's intentional underdosing of medication regimen for other reason: Secondary | ICD-10-CM | POA: Diagnosis not present

## 2022-08-05 DIAGNOSIS — T415X6A Underdosing of therapeutic gases, initial encounter: Secondary | ICD-10-CM | POA: Diagnosis present

## 2022-08-05 DIAGNOSIS — F1414 Cocaine abuse with cocaine-induced mood disorder: Secondary | ICD-10-CM | POA: Diagnosis present

## 2022-08-05 DIAGNOSIS — K219 Gastro-esophageal reflux disease without esophagitis: Secondary | ICD-10-CM | POA: Diagnosis present

## 2022-08-05 DIAGNOSIS — J9621 Acute and chronic respiratory failure with hypoxia: Secondary | ICD-10-CM | POA: Diagnosis present

## 2022-08-05 DIAGNOSIS — Z9151 Personal history of suicidal behavior: Secondary | ICD-10-CM

## 2022-08-05 DIAGNOSIS — E876 Hypokalemia: Secondary | ICD-10-CM

## 2022-08-05 DIAGNOSIS — F319 Bipolar disorder, unspecified: Secondary | ICD-10-CM | POA: Diagnosis present

## 2022-08-05 DIAGNOSIS — I119 Hypertensive heart disease without heart failure: Secondary | ICD-10-CM | POA: Diagnosis present

## 2022-08-05 DIAGNOSIS — G928 Other toxic encephalopathy: Secondary | ICD-10-CM | POA: Diagnosis present

## 2022-08-05 DIAGNOSIS — Z9181 History of falling: Secondary | ICD-10-CM

## 2022-08-05 DIAGNOSIS — I10 Essential (primary) hypertension: Secondary | ICD-10-CM | POA: Diagnosis not present

## 2022-08-05 DIAGNOSIS — R45851 Suicidal ideations: Secondary | ICD-10-CM

## 2022-08-05 DIAGNOSIS — G8929 Other chronic pain: Secondary | ICD-10-CM | POA: Diagnosis present

## 2022-08-05 DIAGNOSIS — F4310 Post-traumatic stress disorder, unspecified: Secondary | ICD-10-CM | POA: Diagnosis present

## 2022-08-05 DIAGNOSIS — E8809 Other disorders of plasma-protein metabolism, not elsewhere classified: Secondary | ICD-10-CM | POA: Diagnosis present

## 2022-08-05 DIAGNOSIS — F1210 Cannabis abuse, uncomplicated: Secondary | ICD-10-CM | POA: Diagnosis present

## 2022-08-05 DIAGNOSIS — Z9981 Dependence on supplemental oxygen: Secondary | ICD-10-CM

## 2022-08-05 DIAGNOSIS — F17210 Nicotine dependence, cigarettes, uncomplicated: Secondary | ICD-10-CM | POA: Diagnosis present

## 2022-08-05 DIAGNOSIS — Z1152 Encounter for screening for COVID-19: Secondary | ICD-10-CM | POA: Diagnosis not present

## 2022-08-05 DIAGNOSIS — F1010 Alcohol abuse, uncomplicated: Secondary | ICD-10-CM | POA: Diagnosis present

## 2022-08-05 DIAGNOSIS — Z5941 Food insecurity: Secondary | ICD-10-CM

## 2022-08-05 DIAGNOSIS — Z91040 Latex allergy status: Secondary | ICD-10-CM

## 2022-08-05 DIAGNOSIS — R112 Nausea with vomiting, unspecified: Secondary | ICD-10-CM | POA: Diagnosis not present

## 2022-08-05 DIAGNOSIS — Z79899 Other long term (current) drug therapy: Secondary | ICD-10-CM

## 2022-08-05 DIAGNOSIS — Z7951 Long term (current) use of inhaled steroids: Secondary | ICD-10-CM

## 2022-08-05 DIAGNOSIS — D75839 Thrombocytosis, unspecified: Secondary | ICD-10-CM | POA: Diagnosis not present

## 2022-08-05 DIAGNOSIS — G9341 Metabolic encephalopathy: Secondary | ICD-10-CM

## 2022-08-05 DIAGNOSIS — J9601 Acute respiratory failure with hypoxia: Secondary | ICD-10-CM | POA: Diagnosis not present

## 2022-08-05 DIAGNOSIS — M549 Dorsalgia, unspecified: Secondary | ICD-10-CM | POA: Diagnosis present

## 2022-08-05 DIAGNOSIS — G629 Polyneuropathy, unspecified: Secondary | ICD-10-CM | POA: Diagnosis present

## 2022-08-05 LAB — RAPID URINE DRUG SCREEN, HOSP PERFORMED
Amphetamines: POSITIVE — AB
Barbiturates: NOT DETECTED
Benzodiazepines: NOT DETECTED
Cocaine: POSITIVE — AB
Opiates: NOT DETECTED
Tetrahydrocannabinol: POSITIVE — AB

## 2022-08-05 LAB — RESP PANEL BY RT-PCR (RSV, FLU A&B, COVID)  RVPGX2
Influenza A by PCR: NEGATIVE
Influenza B by PCR: NEGATIVE
Resp Syncytial Virus by PCR: NEGATIVE
SARS Coronavirus 2 by RT PCR: NEGATIVE

## 2022-08-05 LAB — HEPATIC FUNCTION PANEL
ALT: 15 U/L (ref 0–44)
ALT: 16 U/L (ref 0–44)
AST: 11 U/L — ABNORMAL LOW (ref 15–41)
AST: 16 U/L (ref 15–41)
Albumin: 3.3 g/dL — ABNORMAL LOW (ref 3.5–5.0)
Albumin: 3.2 g/dL — ABNORMAL LOW (ref 3.5–5.0)
Alkaline Phosphatase: 52 U/L (ref 38–126)
Alkaline Phosphatase: 54 U/L (ref 38–126)
Bilirubin, Direct: <0.1 mg/dL (ref 0.0–0.2)
Bilirubin, Direct: <0.1 mg/dL (ref 0.0–0.2)
Total Bilirubin: 0.7 mg/dL (ref 0.3–1.2)
Total Bilirubin: 0.5 mg/dL (ref 0.3–1.2)
Total Protein: 7.3 g/dL (ref 6.5–8.1)
Total Protein: 7.3 g/dL (ref 6.5–8.1)

## 2022-08-05 LAB — BASIC METABOLIC PANEL
Anion gap: 5 (ref 5–15)
BUN: 9 mg/dL (ref 6–20)
CO2: 28 mmol/L (ref 22–32)
Calcium: 8.2 mg/dL — ABNORMAL LOW (ref 8.9–10.3)
Chloride: 106 mmol/L (ref 98–111)
Creatinine, Ser: 0.67 mg/dL (ref 0.44–1.00)
GFR, Estimated: >60 mL/min (ref >60)
Glucose, Bld: 95 mg/dL (ref 70–99)
Potassium: 3.3 mmol/L — ABNORMAL LOW (ref 3.5–5.1)
Sodium: 139 mmol/L (ref 135–145)

## 2022-08-05 LAB — CBC WITH DIFFERENTIAL/PLATELET
Abs Immature Granulocytes: 0.04 10*3/uL (ref 0.00–0.07)
Basophils Absolute: 0 10*3/uL (ref 0.0–0.1)
Basophils Relative: 0 %
Eosinophils Absolute: 0.3 10*3/uL (ref 0.0–0.5)
Eosinophils Relative: 3 %
HCT: 43.6 % (ref 36.0–46.0)
Hemoglobin: 13.5 g/dL (ref 12.0–15.0)
Immature Granulocytes: 0 %
Lymphocytes Relative: 27 %
Lymphs Abs: 2.7 10*3/uL (ref 0.7–4.0)
MCH: 29.2 pg (ref 26.0–34.0)
MCHC: 31 g/dL (ref 30.0–36.0)
MCV: 94.2 fL (ref 80.0–100.0)
Monocytes Absolute: 0.7 10*3/uL (ref 0.1–1.0)
Monocytes Relative: 7 %
Neutro Abs: 6.3 10*3/uL (ref 1.7–7.7)
Neutrophils Relative %: 63 %
Platelets: 465 10*3/uL — ABNORMAL HIGH (ref 150–400)
RBC: 4.63 MIL/uL (ref 3.87–5.11)
RDW: 17.3 % — ABNORMAL HIGH (ref 11.5–15.5)
WBC: 10.1 10*3/uL (ref 4.0–10.5)
nRBC: 0 % (ref 0.0–0.2)

## 2022-08-05 LAB — ACETAMINOPHEN LEVEL: Acetaminophen (Tylenol), Serum: <10 ug/mL — ABNORMAL LOW (ref 10–30)

## 2022-08-05 LAB — BLOOD GAS, ARTERIAL
Acid-Base Excess: 3.8 mmol/L — ABNORMAL HIGH (ref 0.0–2.0)
Bicarbonate: 29.2 mmol/L — ABNORMAL HIGH (ref 20.0–28.0)
O2 Saturation: 92.4 %
Patient temperature: 37
pCO2 arterial: 46 mmHg (ref 32–48)
pH, Arterial: 7.41 (ref 7.35–7.45)
pO2, Arterial: 59 mmHg — ABNORMAL LOW (ref 83–108)

## 2022-08-05 LAB — SALICYLATE LEVEL: Salicylate Lvl: <7 mg/dL — ABNORMAL LOW (ref 7.0–30.0)

## 2022-08-05 LAB — TSH: TSH: 1.359 u[IU]/mL (ref 0.350–4.500)

## 2022-08-05 LAB — MRSA NEXT GEN BY PCR, NASAL: MRSA by PCR Next Gen: NOT DETECTED

## 2022-08-05 LAB — LACTIC ACID, PLASMA: Lactic Acid, Venous: 0.9 mmol/L (ref 0.5–1.9)

## 2022-08-05 LAB — BRAIN NATRIURETIC PEPTIDE: B Natriuretic Peptide: 49.6 pg/mL (ref 0.0–100.0)

## 2022-08-05 MED ORDER — LEVALBUTEROL HCL 0.63 MG/3ML IN NEBU
0.6300 mg | INHALATION_SOLUTION | Freq: Four times a day (QID) | RESPIRATORY_TRACT | Status: DC
  Administered 2022-08-05 – 2022-08-07 (×8): 0.63 mg via RESPIRATORY_TRACT
  Filled 2022-08-05 (×9): qty 3

## 2022-08-05 MED ORDER — SODIUM CHLORIDE 0.9 % IV SOLN: INTRAVENOUS | Status: DC

## 2022-08-05 MED ORDER — PROCHLORPERAZINE EDISYLATE 10 MG/2ML IJ SOLN
10.0000 mg | Freq: Four times a day (QID) | INTRAMUSCULAR | Status: DC | PRN
  Administered 2022-08-08 – 2022-08-14 (×2): 10 mg via INTRAVENOUS
  Filled 2022-08-05 (×2): qty 2

## 2022-08-05 MED ORDER — CHLORHEXIDINE GLUCONATE CLOTH 2 % EX PADS
6.0000 | MEDICATED_PAD | Freq: Every day | CUTANEOUS | Status: DC
  Administered 2022-08-05 – 2022-08-11 (×7): 6 via TOPICAL

## 2022-08-05 MED ORDER — BISACODYL 10 MG RE SUPP: 10.0000 mg | Freq: Every day | RECTAL | Status: DC | PRN

## 2022-08-05 MED ORDER — SODIUM CHLORIDE 0.9 % IV SOLN
500.0000 mg | INTRAVENOUS | Status: AC
  Administered 2022-08-05 – 2022-08-09 (×5): 500 mg via INTRAVENOUS
  Filled 2022-08-05 (×5): qty 5

## 2022-08-05 MED ORDER — IPRATROPIUM BROMIDE 0.02 % IN SOLN
0.5000 mg | Freq: Four times a day (QID) | RESPIRATORY_TRACT | Status: DC
  Administered 2022-08-05 – 2022-08-07 (×8): 0.5 mg via RESPIRATORY_TRACT
  Filled 2022-08-05 (×9): qty 2.5

## 2022-08-05 MED ORDER — POLYETHYLENE GLYCOL 3350 17 G PO PACK: 17.0000 g | PACK | Freq: Every day | ORAL | Status: DC | PRN

## 2022-08-05 MED ORDER — AZITHROMYCIN 250 MG PO TABS
500.0000 mg | ORAL_TABLET | Freq: Once | ORAL | Status: AC
  Administered 2022-08-05: 500 mg via ORAL
  Filled 2022-08-05: qty 2

## 2022-08-05 MED ORDER — HYDRALAZINE HCL 20 MG/ML IJ SOLN
10.0000 mg | Freq: Four times a day (QID) | INTRAMUSCULAR | Status: DC | PRN
  Administered 2022-08-06: 10 mg via INTRAVENOUS
  Filled 2022-08-05: qty 1

## 2022-08-05 MED ORDER — IPRATROPIUM-ALBUTEROL 0.5-2.5 (3) MG/3ML IN SOLN
3.0000 mL | Freq: Once | RESPIRATORY_TRACT | Status: AC
  Administered 2022-08-05: 3 mL via RESPIRATORY_TRACT
  Filled 2022-08-05: qty 3

## 2022-08-05 MED ORDER — PANTOPRAZOLE SODIUM 40 MG IV SOLR
40.0000 mg | INTRAVENOUS | Status: DC
  Administered 2022-08-05 – 2022-08-08 (×4): 40 mg via INTRAVENOUS
  Filled 2022-08-05 (×3): qty 10

## 2022-08-05 MED ORDER — GUAIFENESIN ER 600 MG PO TB12
1200.0000 mg | ORAL_TABLET | Freq: Two times a day (BID) | ORAL | Status: DC
  Administered 2022-08-06 – 2022-08-16 (×21): 1200 mg via ORAL
  Filled 2022-08-05 (×21): qty 2

## 2022-08-05 MED ORDER — ENOXAPARIN SODIUM 40 MG/0.4ML IJ SOSY
40.0000 mg | PREFILLED_SYRINGE | INTRAMUSCULAR | Status: DC
  Administered 2022-08-05: 40 mg via SUBCUTANEOUS
  Filled 2022-08-05: qty 0.4

## 2022-08-05 MED ORDER — SODIUM CHLORIDE 0.9 % IV SOLN
1.0000 g | Freq: Once | INTRAVENOUS | Status: AC
  Administered 2022-08-05: 1 g via INTRAVENOUS
  Filled 2022-08-05: qty 10

## 2022-08-05 MED ORDER — SODIUM CHLORIDE 0.9 % IV SOLN
2.0000 g | INTRAVENOUS | Status: AC
  Administered 2022-08-06 – 2022-08-10 (×5): 2 g via INTRAVENOUS
  Filled 2022-08-05 (×5): qty 20

## 2022-08-05 MED ORDER — SODIUM CHLORIDE 0.9 % IV BOLUS
1000.0000 mL | Freq: Once | INTRAVENOUS | Status: AC
  Administered 2022-08-05: 1000 mL via INTRAVENOUS

## 2022-08-05 MED ORDER — LIP MEDEX EX OINT
1.0000 | TOPICAL_OINTMENT | CUTANEOUS | Status: DC | PRN
  Filled 2022-08-05: qty 7

## 2022-08-05 MED ORDER — POTASSIUM CHLORIDE CRYS ER 20 MEQ PO TBCR
40.0000 meq | EXTENDED_RELEASE_TABLET | Freq: Once | ORAL | Status: AC
  Administered 2022-08-05: 40 meq via ORAL
  Filled 2022-08-05: qty 2

## 2022-08-05 NOTE — H&P (Signed)
History and Physical    Patient: Sarah Cervantes [id number] DOB: 1981-01-05 DOA: 08/05/2022 DOS: the patient was seen and examined on 08/05/2022 PCP: Patient, No Pcp Per  Patient coming from: Home  Chief Complaint:  Chief Complaint  Patient presents with   Shortness of Breath   Fall   Suicidal   HPI: Sarah Cervantes is a 42 y.o. female with medical history significant for but not limited too polysubstance abuse including cocaine daily use, ethanol abuse, heroin abuse, history of alcohol abuse, COPD who supposed to be on 3 L supplemental oxygen chronically, previous suicide attempts, depression and anxiety as well as bipolar 1 disorder and other comorbidities who presents to the ED after becoming more short of breath and having a fall and having suicidal intent.  Of note history is taken from the chart and from the patient does she is extremely somnolent and falls asleep and becomes drowsy when speaking with her.  Reportedly the patient had a fall yesterday and has been more short of breath and has been having suicidal ideation.  After a fall she complained of left knee pain and neck pain status post fall.  Normally she is post ventilator and supplemental O2 via nasal cannula at baseline however has not used supplemental oxygen at least 3 days and this morning she took 4 Tylenol PMs and endorsed that "I just do not want to be here under more".  She presented for shortness of breath as well but she does endorse suicidal ideation with plans to overdose on pills.  She states that the shortness of breath got worse yesterday and she continues to be drowsy and falls asleep on my examination.  She recently was admitted to the inpatient psychiatric unit and presents with worsening shortness of breath.  She could not tell me if she is having chest pain or discomfort.  She been feeling poorly and "sick" for the last few days but did not elaborate on what she is feeling given her current condition.  Further  evaluation and workup was done and she had a CT of the chest which showed "Small right pleural effusion which appears partially loculated in the major fissure and lateral aspect of the right lower hemithorax. Multifocal ground-glass opacities throughout the bilateral lungs more prominent in the upper lobes. Findings are nonspecific and can be seen in the setting of multifocal pneumonia, pulmonary edema or hemorrhage. Mild  cardiomegaly. There are areas of atelectasis throughout the right lung with a small amount of airspace disease in the right middle lobe worrisome for infection."  Patient was placed on supplemental oxygen and continued be drowsy but did endorse suicidal ideation.  TRH was asked to admit this patient for acute on chronic hypoxic respiratory failure in the setting of multifocal pneumonia and partially loculated small pleural effusion as well as suicidal ideation with intent to overdose on pills.  Psychiatry was consulted for further evaluation given her suicidal attempt  Review of Systems: Unable to review all systems due to lack of cooperation from patient. Past Medical History:  Diagnosis Date   Cocaine abuse, daily use ([place]) 03/23/2018   ETOH abuse 03/23/2018   Heroin abuse ([place]) 03/23/2018   History reviewed. No pertinent surgical history. Social History:  reports that she has been smoking cigarettes. She has a 15.00 pack-year smoking history. She has never used smokeless tobacco. She reports current alcohol use of about 12.0 - 15.0 standard drinks of alcohol per week. She reports current drug use. Drug: Cocaine.  Allergies  Allergen Reactions   Latex Swelling, Rash and Other (See Comments)    Hands swell    History reviewed. No pertinent family history.  Prior to Admission medications   Medication Sig Start Date End Date Taking? Authorizing Provider  acetaminophen (TYLENOL) 325 MG tablet Take 2 tablets (650 mg total) by mouth every 6 (six) hours as needed for mild pain  (or Fever >/= 101). 03/16/22   Abenezer Odonell, Georgina Quint Latif, DO  albuterol (VENTOLIN HFA) 108 (90 Base) MCG/ACT inhaler Inhale 2 puffs into the lungs every 6 (six) hours as needed for wheezing or shortness of breath.    [provider]  amLODipine (NORVASC) 5 MG tablet Take 1 tablet (5 mg total) by mouth daily. 07/26/22 10/24/22  Pokhrel, Corrie Mckusick, MD  ARIPiprazole (ABILIFY) 10 MG tablet Take 1 tablet (10 mg total) by mouth daily. 07/25/22 10/23/22  Pokhrel, Corrie Mckusick, MD  budesonide-formoterol (SYMBICORT) 80-4.5 MCG/ACT inhaler Inhale 2 puffs into the lungs 2 (two) times daily. 02/28/22   [provider]  butalbital-acetaminophen-caffeine (FIORICET) 50-325-40 MG tablet Take 1 tablet by mouth every 4 (four) hours as needed for headache.    [provider]  diclofenac (FLECTOR) 1.3 % PTCH Place 1 patch onto the skin 2 (two) times daily for 10 days. 07/26/22 08/05/22  Pokhrel, Corrie Mckusick, MD  FLUoxetine (PROZAC) 40 MG capsule Take 1 capsule (40 mg total) by mouth daily. 07/26/22 10/24/22  Pokhrel, Corrie Mckusick, MD  furosemide (LASIX) 40 MG tablet Take 1 tablet (40 mg total) by mouth daily. 07/26/22 10/24/22  Pokhrel, Corrie Mckusick, MD  gabapentin (NEURONTIN) 400 MG capsule Take 2 capsules (800 mg total) by mouth 3 (three) times daily. 07/26/22 10/24/22  Pokhrel, Corrie Mckusick, MD  hydrocortisone (ANUSOL-HC) 2.5 % rectal cream Place rectally 3 (three) times daily. Patient taking differently: Place 1 Application rectally daily as needed for hemorrhoids. 03/26/18   Charlynne Cousins, MD  hydrOXYzine (ATARAX) 50 MG tablet Take 1 tablet (50 mg total) by mouth 3 (three) times daily as needed for anxiety. 04/06/22   Massengill, Ovid Curd, MD  ibuprofen (ADVIL) 200 MG tablet Take 2 tablets (400 mg total) by mouth every 8 (eight) hours as needed for headache or mild pain. 07/26/22   Pokhrel, Corrie Mckusick, MD  lidocaine (LIDODERM) 5 % Place 1 patch onto the skin daily. Remove & Discard patch within 12 hours or as directed by MD Patient taking  differently: Place 2 patches onto the skin daily. Remove & Discard patch within 12 hours or as directed by MD 03/16/22   Raiford Noble Latif, DO  lisinopril (ZESTRIL) 10 MG tablet Take 1 tablet (10 mg total) by mouth in the morning. 07/25/22 10/23/22  Pokhrel, Corrie Mckusick, MD  nicotine polacrilex (NICORETTE) 2 MG gum Take 1 each (2 mg total) by mouth as needed for smoking cessation. 04/06/22   Massengill, Ovid Curd, MD  OZEMPIC, 0.25 OR 0.5 MG/DOSE, 2 MG/1.5ML SOPN Inject 0.5 mg into the skin every Monday.    [provider]  pantoprazole (PROTONIX) 40 MG tablet Take 1 tablet (40 mg total) by mouth daily. 07/26/22 10/24/22  Pokhrel, Corrie Mckusick, MD  prazosin (MINIPRESS) 1 MG capsule Take 1 capsule (1 mg total) by mouth at bedtime. 07/26/22 10/24/22  Pokhrel, Corrie Mckusick, MD  predniSONE (DELTASONE) 10 MG tablet Take 2 tablets (20 mg total) by mouth daily. 05/27/22   Milton Ferguson, MD  senna (SENOKOT) 8.6 MG TABS tablet Take 1 tablet (8.6 mg total) by mouth daily. 04/06/22   Massengill, Ovid Curd, MD  traZODone (DESYREL) 100 MG tablet Take 1  tablet (100 mg total) by mouth at bedtime. 07/26/22 10/24/22  Flora Lipps, MD    Physical Exam: Vitals:   08/05/22 1215 08/05/22 1548 08/05/22 1641 08/05/22 1700  BP:   (!) 157/91 (!) 160/94  Pulse: 80  89 83  Resp: (!) 23  (!) 28 19  Temp:   98.1 F (36.7 C)   TempSrc:   Oral   SpO2: 97% 94% 91% 99%  Weight:      Height:       Examination: Physical Exam:  Constitutional: Super morbidly obese Caucasian female who is somnolent and drowsy and briefly wakes up and then dozes back off to sleep Respiratory: Diminished to auscultation bilaterally with coarse breath sounds and has some rhonchi and mild crackles.  Has increased respiratory rate and effort is breathing 30 times a minute and wearing supplemental oxygen and having short pursed lip breathing Cardiovascular: Mildly tachycardic, no murmurs / rubs / gallops. S1 and S2 auscultated.  Has 1+ extremity edema.  Abdomen:  Soft, non-tender, distended secondary to body habitus. Bowel sounds positive.  GU: Deferred. Musculoskeletal: No clubbing / cyanosis of digits/nails. No joint deformity upper and lower extremities.  Skin: No rashes, lesions, ulcers limited skin evaluation. No induration; Warm and dry.  Neurologic: CN 2-12 grossly intact with no focal deficits. Romberg sign and cerebellar reflexes not assessed.  Psychiatric: Normal judgment and insight. Alert and oriented x 3. Normal mood and appropriate affect.   Data Reviewed:  LAB TREND:  Recent Labs  Lab 07/16/22 1320 07/22/22 0633 07/25/22 0555 08/05/22 1635  WBC 8.2 7.0 7.3 10.1  HGB 16.8* 12.8 13.1 13.5  HCT 54.1* 41.9 41.9 43.6  MCV 93.8 95.0 95.0 94.2  PLT 392 328 331 465*   Recent Labs  Lab 07/16/22 1320 07/16/22 1320 07/22/22 0633 07/24/22 0436 07/25/22 0555 08/05/22 1635  NA 139  --  134*  --  136 139  K 4.3  --  4.2  --  4.4 3.3*  CL 103  --  98  --  100 106  CO2 27  --  29  --  29 28  BUN 9  --  15  --  24* 9  CREATININE 0.87  --  0.78   < > 0.85 0.67  GLUCOSE 103*  --  129*  --  96 95  MG  --    < > 2.2  --  2.2  --    < > = values in this interval not displayed.   Assessment and Plan: No notes have been filed under this hospital service. Service: Hospitalist  Acute on chronic respiratory failure with hypoxia with a suspected history of obesity hypoventilation syndrome -She was somnolent and drowsy and had increased respiratory rate and was breathing 30 times a minute -CT scan shows multifocal pneumonia so placed her on empiric antibiotics with IV ceftriaxone and azithromycin Will start her on nebs with Xopenex and Atrovent and add guaifenesin as well as a flutter valve and incentive spirometry -Given her somnolence and drowsiness will obtain an ABG and she may benefit from short-term BiPAP so this has been ordered -ABG    Component Value Date/Time   PHART 7.41 08/05/2022 1828   PCO2ART 46 08/05/2022 1828   PO2ART  59 (L) 08/05/2022 1828   HCO3 29.2 (H) 08/05/2022 1828   O2SAT 92.4 08/05/2022 1828  -She will need a repeat chest x-ray in the a.m. -Check blood cultures x 2-check respiratory virus panel via PCR as well as the  20 panel of -Continue continuous pulse oximetry maintain O2 saturation greater 90% -Hold home inhalers with Symbicort and albuterol Continue supplemental oxygen via nasal cannula wean O2 as tolerated-she will need an amatory home O2 screen and repeat chest x-ray prior to discharge  Somnolence and drowsiness -Could be in the setting of above however she did endorse to me that she did take 4 Tylenol PMs and unclear etiology what else she took -Check UDS and Poison control was called and they recommended close monitoring with cardiac monitoring as well as monitoring for hypotension, seizures -Poison control recommended acetaminophen level as well as salicylate level and following up on the UDS and recommended calling back once acetaminophen level was obtained and obtaining LFTs -Will give her 1 L bolus and placed on maintenance IV fluid hydration and move her to the stepdown unit for close observation and monitoring  Hypokalemia -Patient's K+ Level Trend: Recent Labs  Lab 07/16/22 1320 07/22/22 0633 07/25/22 0555 08/05/22 1635  K 4.3 4.2 4.4 3.3*  -Replete in the ED -Continue to Monitor and Replete as Necessary -Repeat CMP in the AM   Suicidal ideation with history of bipolar 1 disorder -She was somnolent and drowsy but endorsed suicidal ideation so she will need one-to-one sitter; she has been involuntary committed and psychiatry has been consulted and recommending inpatient psychiatric hospitalization when she is medically stable -Monitor closely for SI, HI, AVH -Hold her home psychiatric medications including fluoxetine, gabapentin as well as Abilify and prazosin for now and defer to psych  Essential hypertension -Hold lisinopril and furosemide for now including amlodipine  as well  GERD/GI prophylaxis -Continue with pantoprazole but changed to IV form  Thrombocytosis -Likely reactive in the setting of above Recent Labs  Lab 07/16/22 1320 07/22/22 0633 07/25/22 0555 08/05/22 1635  PLT 392 328 331 465*  -Continue monitor and trend and repeat CBC in a.m.  Super Morbid Obesity/OHS -Complicates overall prognosis and care -Estimated body mass index is 53.21 kg/m as calculated from the following:   Height as of this encounter: '5\' 4"'$  (1.626 m).   Weight as of this encounter: 140.6 kg.  -Weight Loss and Dietary Counseling will be given when she is much more awake and alert  Fall -She will need PT and OT to further evaluate and treat when she is much more awake and alert  Polysubstance abuse -Monitor for withdrawal symptoms given that she has a history of heroin abuse and daily cocaine use -Will not place her on a alcohol withdrawal protocol-given her extreme somnolence and drowsiness -Check UDS and this is depending   Advance Care Planning:   Code Status: Full Code   Consults: Poison Control  Family Communication: No family present at bedside  Severity of Illness: The appropriate patient status for this patient is INPATIENT. Inpatient status is judged to be reasonable and necessary in order to provide the required intensity of service to ensure the patient's safety. The patient's presenting symptoms, physical exam findings, and initial radiographic and laboratory data in the context of their chronic comorbidities is felt to place them at high risk for further clinical deterioration. Furthermore, it is not anticipated that the patient will be medically stable for discharge from the hospital within 2 midnights of admission.   * I certify that at the point of admission it is my clinical judgment that the patient will require inpatient hospital care spanning beyond 2 midnights from the point of admission due to high intensity of service, high risk for  further  deterioration and high frequency of surveillance required.*  Author: Raiford Noble, DO [facility]  08/05/2022 6:57 PM  For on call review [email or web address].

## 2022-08-05 NOTE — Consult Note (Signed)
[facility] ASSESSMENT   Reason for Consult:  Psych Consul Referring Physician:  Cherlynn June, PA-C Patient Identification: Sarah Cervantes MRN:  [id number] ED Chief Complaint: Multifocal pneumonia  Diagnosis:  Principal Problem:   Multifocal pneumonia Active Problems:   Substance induced mood disorder ([place])   Suicidal ideation   ED Assessment Time Calculation: Start Time: 1700 Stop Time: 1730 Total Time in Minutes (Assessment Completion): 30    HPI: Per Triage Note "Patient brought in by to see [facility] from hotel complaining of a fall yesterday, shortness of breath, and SI.  Patient reports "I just do not want to be here anymore." Endorses plan to overdose on pills."   Subjective:  Sarah Cervantes is a 42 y.o. female seen and evaluated face-to-face by this provider.  Provider is attempted to talk the patient, she continues to fall asleep throughout assessment, when provider asked "are you with me", patient states " I am with you, I am not sleep " Patient continues to endorse suicidal ideations with a plan to overdose. She tells me that " I just don't want to be here anymore."  Provider asked patient what she means by being here, here in the hospital, patient states no here on earth "I just want to die".  Patient denies HI/AVH, endorses depression, and hopelessness.  Patient feels because she has a lot of medical issues that she does not need to be here on earth, patient begins dozing back off to sleep.  Patient states he is having some pain in her chest. At the time of the evaluation, patient continues to be drowsy, but is also able to endorse using cocaine.  UDS positive for cocaine only, BAL less than 10. During evaluation Sarah Cervantes is laying in her hospital bed in pain, stating she is having chest and leg pain. EDP is aware. She is alert, oriented x 3, calm, cooperative and partially drowsy. Her mood is sad with congruent affect. She has normal speech, and behavior.  Objectively  there is no evidence of psychosis/mania or delusional thinking.  Patient is able to converse coherently, no distractibility, or pre-occupation.  She denies homicidal ideation, psychosis, and paranoia. Patient continues to endorse suicidal ideations with a plan to overdose on pills.   Past Psychiatric History:  Reported history with bipolar disorder, schizoaffective disorder, posttraumatic stress disorder, and major depressive disorder.  Reports previous suicide attempts.   Risk to Self or Others: Is the patient at risk to self? Yes Has the patient been a risk to self in the past 6 months? Yes Has the patient been a risk to self within the distant past? No Is the patient a risk to others? denies Has the patient been a risk to others in the past 6 months? No Has the patient been a risk to others within the distant past? No  [place] Scale:  [facility] ED from 08/05/2022 in [facility] Emergency Department at [facility] ED to Hosp-Admission (Discharged) from 07/16/2022 in [facility] ED from 05/27/2022 in [facility] Emergency Department at [facility] High Risk Moderate Risk High Risk       AIMS:  , , ,  ,   ASAM:    Substance Abuse:     Past Medical History:  Past Medical History:  Diagnosis Date   Cocaine abuse, daily use ([place]) 03/23/2018   ETOH abuse 03/23/2018   Heroin abuse ([place]) 03/23/2018   History reviewed. No pertinent surgical  history. Family History: History reviewed. No pertinent family history.  Family Psychiatric  History: Mom-schizoaffective disorder, deceased. Denies history of suicide attempt or completion in family.  Social History:  Social History   Substance and Sexual Activity  Alcohol Use Yes   Alcohol/week: 12.0 - 15.0 standard drinks of alcohol   Types: 12 - 15 Cans of beer per week   Comment: drinking daily for the past week     Social History   Substance and Sexual  Activity  Drug Use Yes   Types: Cocaine    Social History   Socioeconomic History   Marital status: Single    Spouse name: Not on file   Number of children: Not on file   Years of education: Not on file   Highest education level: Not on file  Occupational History   Not on file  Tobacco Use   Smoking status: Every Day    Packs/day: 0.50    Years: 30.00    Total pack years: 15.00    Types: Cigarettes    Last attempt to quit: 01/12/2022    Years since quitting: 0.5   Smokeless tobacco: Never  Vaping Use   Vaping Use: Never used  Substance and Sexual Activity   Alcohol use: Yes    Alcohol/week: 12.0 - 15.0 standard drinks of alcohol    Types: 12 - 15 Cans of beer per week    Comment: drinking daily for the past week   Drug use: Yes    Types: Cocaine   Sexual activity: Yes    Birth control/protection: None  Other Topics Concern   Not on file  Social History Narrative   Not on file   Social Determinants of Health   Financial Resource Strain: Not on file  Food Insecurity: No Food Insecurity (07/23/2022)   Hunger Vital Sign    Worried About Running Out of Food in the Last Year: Never true    Ran Out of Food in the Last Year: Never true  Transportation Needs: No Transportation Needs (07/23/2022)   PRAPARE - Hydrologist (Medical): No    Lack of Transportation (Non-Medical): No  Physical Activity: Not on file  Stress: Not on file  Social Connections: Not on file    Allergies:   Allergies  Allergen Reactions   Latex Swelling, Rash and Other (See Comments)    Hands swell    Labs:  Results for orders placed or performed during the hospital encounter of 08/05/22 (from the past 48 hour(s))  Basic metabolic panel     Status: Abnormal   Collection Time: 08/05/22  4:35 PM  Result Value Ref Range   Sodium 139 135 - 145 mmol/L   Potassium 3.3 (L) 3.5 - 5.1 mmol/L   Chloride 106 98 - 111 mmol/L   CO2 28 22 - 32 mmol/L   Glucose, Bld 95 70 -  99 mg/dL    Comment: Glucose reference range applies only to samples taken after fasting for at least 8 hours.   BUN 9 6 - 20 mg/dL   Creatinine, Ser 0.67 0.44 - 1.00 mg/dL   Calcium 8.2 (L) 8.9 - 10.3 mg/dL   GFR, Estimated >60 >60 mL/min    Comment: (NOTE) Calculated using the CKD-EPI Creatinine Equation (2021)    Anion gap 5 5 - 15    Comment: Performed at [facility], [place] [address] [place], [place] [address]  CBC with Differential     Status: Abnormal  Collection Time: 08/05/22  4:35 PM  Result Value Ref Range   WBC 10.1 4.0 - 10.5 K/uL   RBC 4.63 3.87 - 5.11 MIL/uL   Hemoglobin 13.5 12.0 - 15.0 g/dL   HCT 43.6 36.0 - 46.0 %   MCV 94.2 80.0 - 100.0 fL   MCH 29.2 26.0 - 34.0 pg   MCHC 31.0 30.0 - 36.0 g/dL   RDW 17.3 (H) 11.5 - 15.5 %   Platelets 465 (H) 150 - 400 K/uL   nRBC 0.0 0.0 - 0.2 %   Neutrophils Relative % 63 %   Neutro Abs 6.3 1.7 - 7.7 K/uL   Lymphocytes Relative 27 %   Lymphs Abs 2.7 0.7 - 4.0 K/uL   Monocytes Relative 7 %   Monocytes Absolute 0.7 0.1 - 1.0 K/uL   Eosinophils Relative 3 %   Eosinophils Absolute 0.3 0.0 - 0.5 K/uL   Basophils Relative 0 %   Basophils Absolute 0.0 0.0 - 0.1 K/uL   Immature Granulocytes 0 %   Abs Immature Granulocytes 0.04 0.00 - 0.07 K/uL    Comment: Performed at [facility], [place] [address], [place], [place] [address]  Lactic acid, plasma     Status: None   Collection Time: 08/05/22  4:35 PM  Result Value Ref Range   Lactic Acid, Venous 0.9 0.5 - 1.9 mmol/L    Comment: Performed at [name], [place] [address] [place],  [address]    Current Facility-Administered Medications  Medication Dose Route Frequency Provider Last Rate Last Admin   ipratropium (ATROVENT) nebulizer solution 0.5 mg  0.5 mg Nebulization Q6H Sheikh, Omair Latif, DO       levalbuterol [facility]) nebulizer solution 0.63 mg  0.63 mg Nebulization Q6H Sheikh, Omair Latif, DO       potassium  chloride SA (KLOR-CON M) CR tablet 40 mEq  40 mEq Oral Once Suella Broad A, PA-C       sodium chloride 0.9 % bolus 1,000 mL  1,000 mL Intravenous Once Sheikh, Omair Latif, DO       Current Outpatient Medications  Medication Sig Dispense Refill   acetaminophen (TYLENOL) 325 MG tablet Take 2 tablets (650 mg total) by mouth every 6 (six) hours as needed for mild pain (or Fever >/= 101). 20 tablet 0   albuterol (VENTOLIN HFA) 108 (90 Base) MCG/ACT inhaler Inhale 2 puffs into the lungs every 6 (six) hours as needed for wheezing or shortness of breath.     amLODipine (NORVASC) 5 MG tablet Take 1 tablet (5 mg total) by mouth daily. 30 tablet 2   ARIPiprazole (ABILIFY) 10 MG tablet Take 1 tablet (10 mg total) by mouth daily. 30 tablet 2   budesonide-formoterol (SYMBICORT) 80-4.5 MCG/ACT inhaler Inhale 2 puffs into the lungs 2 (two) times daily.     butalbital-acetaminophen-caffeine (FIORICET) 50-325-40 MG tablet Take 1 tablet by mouth every 4 (four) hours as needed for headache.     diclofenac (FLECTOR) 1.3 % PTCH Place 1 patch onto the skin 2 (two) times daily for 10 days. 30 patch 0   FLUoxetine (PROZAC) 40 MG capsule Take 1 capsule (40 mg total) by mouth daily. 30 capsule 2   furosemide (LASIX) 40 MG tablet Take 1 tablet (40 mg total) by mouth daily. 30 tablet 2   gabapentin (NEURONTIN) 400 MG capsule Take 2 capsules (800 mg total) by mouth 3 (three) times daily. 180 capsule 2   hydrocortisone (ANUSOL-HC) 2.5 % rectal cream Place rectally  3 (three) times daily. (Patient taking differently: Place 1 Application rectally daily as needed for hemorrhoids.) 30 g 0   hydrOXYzine (ATARAX) 50 MG tablet Take 1 tablet (50 mg total) by mouth 3 (three) times daily as needed for anxiety. 30 tablet 0   ibuprofen (ADVIL) 200 MG tablet Take 2 tablets (400 mg total) by mouth every 8 (eight) hours as needed for headache or mild pain. 30 tablet 0   lidocaine (LIDODERM) 5 % Place 1 patch onto the skin daily. Remove &  Discard patch within 12 hours or as directed by MD (Patient taking differently: Place 2 patches onto the skin daily. Remove & Discard patch within 12 hours or as directed by MD) 30 patch 0   lisinopril (ZESTRIL) 10 MG tablet Take 1 tablet (10 mg total) by mouth in the morning. 30 tablet 2   nicotine polacrilex (NICORETTE) 2 MG gum Take 1 each (2 mg total) by mouth as needed for smoking cessation. 100 tablet 0   OZEMPIC, 0.25 OR 0.5 MG/DOSE, 2 MG/1.5ML SOPN Inject 0.5 mg into the skin every Monday.     pantoprazole (PROTONIX) 40 MG tablet Take 1 tablet (40 mg total) by mouth daily. 30 tablet 2   prazosin (MINIPRESS) 1 MG capsule Take 1 capsule (1 mg total) by mouth at bedtime. 30 capsule 2   predniSONE (DELTASONE) 10 MG tablet Take 2 tablets (20 mg total) by mouth daily. 15 tablet 0   senna (SENOKOT) 8.6 MG TABS tablet Take 1 tablet (8.6 mg total) by mouth daily. 30 tablet 0   traZODone (DESYREL) 100 MG tablet Take 1 tablet (100 mg total) by mouth at bedtime. 30 tablet 2    Musculoskeletal:  Observed patient resting in bed.    Psychiatric Specialty Exam: Presentation  General Appearance:  Disheveled  Eye Contact: Fair  Speech: Clear and Coherent  Speech Volume: Normal  Handedness: Right   Mood and Affect  Mood: Anxious  Affect: Appropriate; Depressed   Thought Process  Thought Processes: Coherent  Descriptions of Associations:Intact  Orientation:Full (Time, Place and Person)  Thought Content:Logical  History of Schizophrenia/Schizoaffective disorder:No  Duration of Psychotic Symptoms:N/A  Hallucinations:Hallucinations: None  Ideas of Reference:None  Suicidal Thoughts:Suicidal Thoughts: Yes, Active SI Active Intent and/or Plan: With Plan  Homicidal Thoughts:Homicidal Thoughts: No   Sensorium  Memory: Immediate Fair; Recent Fair  Judgment: Fair  Insight: Fair   Executive Functions  Concentration: Good  Attention  Span: Good  Recall: Good  Fund of Knowledge: Good  Language: Good   Psychomotor Activity  Psychomotor Activity: Psychomotor Activity: Normal   Assets  Assets: Communication Skills; Social Support    Sleep  Sleep: Sleep: Fair   Physical Exam: Physical Exam Vitals and nursing note reviewed.  Cardiovascular:     Rate and Rhythm: Normal rate.  Neurological:     Mental Status: She is alert.  Psychiatric:        Attention and Perception: Attention normal.        Mood and Affect: Mood is anxious.        Speech: Speech is slurred.        Behavior: Behavior is cooperative.        Thought Content: Thought content includes suicidal ideation. Thought content includes suicidal plan.        Judgment: Judgment is impulsive and inappropriate.    Review of Systems  Constitutional: Negative.   HENT: Negative.    Psychiatric/Behavioral:  Positive for depression, substance abuse and suicidal ideas.  Blood pressure (!) 160/94, pulse 83, temperature 98.1 F (36.7 C), temperature source Oral, resp. rate 19, height '5\' 4"'$  (1.626 m), weight (!) 140.6 kg, last menstrual period 07/01/2021, SpO2 99 %. Body mass index is 53.21 kg/m.  Medical Decision Making: Patient case review with Dr. Dwyane Dee. Patient needs inpatient psychiatric admission for stabilization and treatment.  Patient has many risk factors.      Disposition: Recommend psychiatric Inpatient admission when medically cleared.  [name], PMHNP 08/05/2022 6:41 PM

## 2022-08-05 NOTE — ED Provider Notes (Signed)
[facility] Provider Note   CSN: [id number] Arrival date & time: 08/05/22  1150     History  Chief Complaint  Patient presents with   Shortness of Breath   Fall   Suicidal    Sarah Cervantes is a 42 y.o. female.  Patient presents emergency room via EMS complaining of shortness of breath, knee pain, neck tenderness, and suicidal ideation.  [facility] at [facility]  She states that yesterday she fell when she tripped.  She states that her neck has been sore since falling and she has left-sided knee pain.  The patient also complains of worsening shortness of breath.  She was prescribed oxygen to use at home, 3 L nasal cannula at baseline, but has not been using this for the past few days.  When asked why the patient gives vague answers of either "I do not know" or "I do not have any".  The patient endorses suicidal ideation with plans to overdose on pills.  Patient states "I do not want to be here anymore."  The patient is here voluntarily at this time.  Past medical history includes cocaine abuse, heroin abuse, alcohol abuse, polysubstance dependence including opioid drug with daily use, PTSD, substance-induced mood disorder, acute respiratory failure with hypoxia, major depressive disorder, bipolar disorder  HPI     Home Medications Prior to Admission medications   Medication Sig Start Date End Date Taking? Authorizing Provider  acetaminophen (TYLENOL) 325 MG tablet Take 2 tablets (650 mg total) by mouth every 6 (six) hours as needed for mild pain (or Fever >/= 101). 03/16/22   Sheikh, Georgina Quint Latif, DO  albuterol (VENTOLIN HFA) 108 (90 Base) MCG/ACT inhaler Inhale 2 puffs into the lungs every 6 (six) hours as needed for wheezing or shortness of breath.    [provider]  amLODipine (NORVASC) 5 MG tablet Take 1 tablet (5 mg total) by mouth daily. 07/26/22 10/24/22  Pokhrel, Corrie Mckusick, MD  ARIPiprazole (ABILIFY) 10 MG tablet Take 1  tablet (10 mg total) by mouth daily. 07/25/22 10/23/22  Pokhrel, Corrie Mckusick, MD  budesonide-formoterol (SYMBICORT) 80-4.5 MCG/ACT inhaler Inhale 2 puffs into the lungs 2 (two) times daily. 02/28/22   [provider]  butalbital-acetaminophen-caffeine (FIORICET) 50-325-40 MG tablet Take 1 tablet by mouth every 4 (four) hours as needed for headache.    [provider]  diclofenac (FLECTOR) 1.3 % PTCH Place 1 patch onto the skin 2 (two) times daily for 10 days. 07/26/22 08/05/22  Pokhrel, Corrie Mckusick, MD  FLUoxetine (PROZAC) 40 MG capsule Take 1 capsule (40 mg total) by mouth daily. 07/26/22 10/24/22  Pokhrel, Corrie Mckusick, MD  furosemide (LASIX) 40 MG tablet Take 1 tablet (40 mg total) by mouth daily. 07/26/22 10/24/22  Pokhrel, Corrie Mckusick, MD  gabapentin (NEURONTIN) 400 MG capsule Take 2 capsules (800 mg total) by mouth 3 (three) times daily. 07/26/22 10/24/22  Pokhrel, Corrie Mckusick, MD  hydrocortisone (ANUSOL-HC) 2.5 % rectal cream Place rectally 3 (three) times daily. Patient taking differently: Place 1 Application rectally daily as needed for hemorrhoids. 03/26/18   Charlynne Cousins, MD  hydrOXYzine (ATARAX) 50 MG tablet Take 1 tablet (50 mg total) by mouth 3 (three) times daily as needed for anxiety. 04/06/22   Massengill, Ovid Curd, MD  ibuprofen (ADVIL) 200 MG tablet Take 2 tablets (400 mg total) by mouth every 8 (eight) hours as needed for headache or mild pain. 07/26/22   Pokhrel, Corrie Mckusick, MD  lidocaine (LIDODERM) 5 % Place 1 patch onto  the skin daily. Remove & Discard patch within 12 hours or as directed by MD Patient taking differently: Place 2 patches onto the skin daily. Remove & Discard patch within 12 hours or as directed by MD 03/16/22   Raiford Noble Latif, DO  lisinopril (ZESTRIL) 10 MG tablet Take 1 tablet (10 mg total) by mouth in the morning. 07/25/22 10/23/22  Pokhrel, Corrie Mckusick, MD  nicotine polacrilex (NICORETTE) 2 MG gum Take 1 each (2 mg total) by mouth as needed for smoking cessation. 04/06/22    Massengill, Ovid Curd, MD  OZEMPIC, 0.25 OR 0.5 MG/DOSE, 2 MG/1.5ML SOPN Inject 0.5 mg into the skin every Monday.    [provider]  pantoprazole (PROTONIX) 40 MG tablet Take 1 tablet (40 mg total) by mouth daily. 07/26/22 10/24/22  Pokhrel, Corrie Mckusick, MD  prazosin (MINIPRESS) 1 MG capsule Take 1 capsule (1 mg total) by mouth at bedtime. 07/26/22 10/24/22  Pokhrel, Corrie Mckusick, MD  predniSONE (DELTASONE) 10 MG tablet Take 2 tablets (20 mg total) by mouth daily. 05/27/22   Milton Ferguson, MD  senna (SENOKOT) 8.6 MG TABS tablet Take 1 tablet (8.6 mg total) by mouth daily. 04/06/22   Massengill, Ovid Curd, MD  traZODone (DESYREL) 100 MG tablet Take 1 tablet (100 mg total) by mouth at bedtime. 07/26/22 10/24/22  Pokhrel, Corrie Mckusick, MD      Allergies    Latex    Review of Systems   Review of Systems  Constitutional:  Negative for fever.  Respiratory:  Positive for cough, shortness of breath and wheezing.   Cardiovascular:  Negative for chest pain.  Gastrointestinal:  Negative for abdominal pain, nausea and vomiting.  Genitourinary:  Negative for dysuria.  Psychiatric/Behavioral:  Positive for suicidal ideas.     Physical Exam Updated Vital Signs BP (!) 174/97 (BP Location: Right Arm)   Pulse 80   Temp 98.2 F (36.8 C) (Oral)   Resp (!) 23   Ht '5\' 4"'$  (1.626 m)   Wt (!) 140.6 kg   LMP 07/01/2021   SpO2 97%   BMI 53.21 kg/m  Physical Exam Vitals and nursing note reviewed.  Constitutional:      General: She is not in acute distress.    Appearance: She is obese.  HENT:     Head: Normocephalic and atraumatic.  Eyes:     Conjunctiva/sclera: Conjunctivae normal.  Cardiovascular:     Rate and Rhythm: Normal rate and regular rhythm.     Heart sounds: No murmur heard. Pulmonary:     Effort: Pulmonary effort is normal. No respiratory distress.     Breath sounds: Wheezing present.  Chest:     Chest wall: No tenderness.  Abdominal:     Palpations: Abdomen is soft.     Tenderness: There is no  abdominal tenderness.  Musculoskeletal:        General: No swelling.     Cervical back: Neck supple.     Right lower leg: No edema.     Left lower leg: No edema.  Skin:    General: Skin is warm and dry.     Capillary Refill: Capillary refill takes less than 2 seconds.  Neurological:     Mental Status: She is alert.  Psychiatric:        Mood and Affect: Mood normal.     ED Results / Procedures / Treatments   Labs (all labs ordered are listed, but only abnormal results are displayed) Labs Reviewed  BASIC METABOLIC PANEL  BRAIN NATRIURETIC PEPTIDE  CBC WITH  DIFFERENTIAL/PLATELET  BLOOD GAS, VENOUS    EKG EKG Interpretation  Date/Time:  Saturday August 05 2022 12:08:57 EST Ventricular Rate:  84 PR Interval:  169 QRS Duration: 90 QT Interval:  382 QTC Calculation: 452 R Axis:   -75 Text Interpretation: Sinus rhythm Consider left atrial enlargement Abnormal R-wave progression, late transition Confirmed by Octaviano Glow [phone]) on 08/05/2022 1:14:18 PM  Radiology DG Knee Complete 4 Views Left  Result Date: 08/05/2022 CLINICAL DATA:  Shortness of breath.  Fell yesterday. EXAM: LEFT KNEE - COMPLETE 4+ VIEW COMPARISON:  None Available. FINDINGS: There is mild degenerative change primarily of the MEDIAL and patellofemoral compartments. There is no acute fracture or subluxation. There is trace joint effusion. IMPRESSION: 1. Mild degenerative changes. 2. Trace joint effusion. Electronically Signed   By: Nolon Nations M.D.   On: 08/05/2022 13:33   DG Chest 2 View  Result Date: 08/05/2022 CLINICAL DATA:  Shortness of breath. EXAM: CHEST - 2 VIEW COMPARISON:  Chest radiograph 07/16/2022 FINDINGS: Limited exam due to technique. Monitoring leads overlie the patient. Stable cardiomegaly. Interval moderate right pleural effusion and underlying consolidation within the right lower lung. No definite pneumothorax. Thoracic spine degenerative changes. IMPRESSION: 1. Limited exam due to  technique. 2. Moderate right pleural effusion with underlying consolidation. This is poorly evaluated on current exam. Consider repeat PA and lateral chest radiograph or chest CT for further evaluation depending upon clinical concern. Electronically Signed   By: Lovey Newcomer M.D.   On: 08/05/2022 13:31    Procedures Procedures    Medications Ordered in ED Medications - No data to display  ED Course/ Medical Decision Making/ A&P                             Medical Decision Making Amount and/or Complexity of Data Reviewed Labs: ordered. Radiology: ordered.   This patient presents to the ED for concern of shortness of breath, this involves an extensive number of treatment options, and is a complaint that carries with it a high risk of complications and morbidity.  The differential diagnosis includes acute respiratory failure, COPD exacerbation, heart failure, pneumonia, other pains   Co morbidities that complicate the patient evaluation  Substance abuse history, bipolar disorder, hx acute respiratory failure, COPD   Additional history obtained:  Additional history obtained from EMS External records from outside source obtained and reviewed including discharge summary from February 28.  Patient was admitted due to suicidal ideation, COPD with acute exacerbation, polysubstance dependence   Imaging Studies ordered:  I ordered imaging studies including plain films of the chest and left knee. I independently visualized and interpreted imaging which showed mild degenerative changes and a trace effusion in the left knee. 1. Limited exam due to technique.  2. Moderate right pleural effusion with underlying consolidation.   I agree with the radiologist interpretation   Cardiac Monitoring: / EKG:  The patient was maintained on a cardiac monitor.  I personally viewed and interpreted the cardiac monitored which showed an underlying rhythm of: sinus rhythm   Social Determinants of  Health:  Patient reportedly has housing insecurity   Test / Admission - Considered:  Patient care being transferred to Suella Broad PA-C. Labs have not been drawn as of shift handoff. CT chest results pending. Disposition pending results of remainder of workup. If workup is benign and patient does not require admission, patient will need TTS consult due to suicidal ideations.  Final Clinical Impression(s) / ED Diagnoses Final diagnoses:  None    Rx / DC Orders ED Discharge Orders     None         Ronny Bacon 08/05/22 1507    Wyvonnia Dusky, MD 08/05/22 1520

## 2022-08-05 NOTE — ED Triage Notes (Signed)
Pt BIB [facility] from hotel C/O fall yesterday, SOB, and SI. Pt C/O L knee pain and neck pain s/p fall. Pt supposed to be on 3L [place] at baseline, hasn't used O2 at home in at least 3 days. Pt reports "I just don't wanna be here anymore." Endorses plan to overdose on pills.

## 2022-08-05 NOTE — ED Notes (Signed)
This RN attempted to start PIV and pt jerked arm away after being warned that I was about to stick needle in her arm. Pt then refused for me to stick again, stating, "I want someone else. You don't know what the fuck you're doing. Give me someone who knows what they're doing."

## 2022-08-05 NOTE — ED Notes (Signed)
Pt transported back to room from CT via stretcher.  

## 2022-08-05 NOTE — ED Provider Notes (Cosign Needed Addendum)
42 yo with SHOB, recent admit for pysch + COPD.  Pending CT chest for pleural effusion on CXR. On 3L at baseline that she is not using. ?IVC for SI Fell yesterday, left knee pain, small effusion on XR.  If medially cleared with CT and labs, needs TTS eval Physical Exam  BP (!) 160/94   Pulse 83   Temp 98.1 F (36.7 C) (Oral)   Resp 19   Ht '5\' 4"'$  (1.626 m)   Wt (!) 140.6 kg   LMP 07/01/2021   SpO2 99%   BMI 53.21 kg/m   Physical Exam  Procedures  Procedures  ED Course / MDM    Medical Decision Making Amount and/or Complexity of Data Reviewed Labs: ordered. Radiology: ordered.  Risk Prescription drug management. Decision regarding hospitalization.   Patient states she has been feeling poorly for the past few days, coughing (productive), SHOB, not eating (hungry and asking for food). Patient has been a difficult stick, abx ordered as well as labs to consult for admission. On 3L [place] sats 89-92%, states she is supposed to be on O2 at home but it was taken away and she doesn't want to talk about it.    Case discussed with Dr. Alfredia Ferguson with [facility], accepts for admission, requests extended resp panel, UDS, IVC filed (filled out by day team).   CBC with normal WBC. BMP with mild hypokalemia. Lactic acid reassuring at 0.9.       Tacy Learn, PA-C 08/05/22 1750    Tacy Learn, PA-C 08/05/22 1751    Wyvonnia Dusky, MD 08/06/22 [phone]

## 2022-08-06 ENCOUNTER — Inpatient Hospital Stay (HOSPITAL_COMMUNITY): Payer: 59

## 2022-08-06 DIAGNOSIS — J9601 Acute respiratory failure with hypoxia: Secondary | ICD-10-CM | POA: Diagnosis not present

## 2022-08-06 DIAGNOSIS — J189 Pneumonia, unspecified organism: Secondary | ICD-10-CM | POA: Diagnosis not present

## 2022-08-06 DIAGNOSIS — R45851 Suicidal ideations: Secondary | ICD-10-CM | POA: Diagnosis not present

## 2022-08-06 DIAGNOSIS — F1994 Other psychoactive substance use, unspecified with psychoactive substance-induced mood disorder: Secondary | ICD-10-CM | POA: Diagnosis not present

## 2022-08-06 LAB — CBC WITH DIFFERENTIAL/PLATELET
Abs Immature Granulocytes: 0.03 10*3/uL (ref 0.00–0.07)
Basophils Absolute: 0 10*3/uL (ref 0.0–0.1)
Basophils Relative: 0 %
Eosinophils Absolute: 0.4 10*3/uL (ref 0.0–0.5)
Eosinophils Relative: 4 %
HCT: 42.7 % (ref 36.0–46.0)
Hemoglobin: 13.4 g/dL (ref 12.0–15.0)
Immature Granulocytes: 0 %
Lymphocytes Relative: 29 %
Lymphs Abs: 2.4 10*3/uL (ref 0.7–4.0)
MCH: 29.6 pg (ref 26.0–34.0)
MCHC: 31.4 g/dL (ref 30.0–36.0)
MCV: 94.5 fL (ref 80.0–100.0)
Monocytes Absolute: 0.7 10*3/uL (ref 0.1–1.0)
Monocytes Relative: 8 %
Neutro Abs: 4.9 10*3/uL (ref 1.7–7.7)
Neutrophils Relative %: 59 %
Platelets: 442 10*3/uL — ABNORMAL HIGH (ref 150–400)
RBC: 4.52 MIL/uL (ref 3.87–5.11)
RDW: 17.5 % — ABNORMAL HIGH (ref 11.5–15.5)
WBC: 8.5 10*3/uL (ref 4.0–10.5)
nRBC: 0 % (ref 0.0–0.2)

## 2022-08-06 LAB — COMPREHENSIVE METABOLIC PANEL
ALT: 13 U/L (ref 0–44)
AST: 13 U/L — ABNORMAL LOW (ref 15–41)
Albumin: 3.1 g/dL — ABNORMAL LOW (ref 3.5–5.0)
Alkaline Phosphatase: 56 U/L (ref 38–126)
Anion gap: 4 — ABNORMAL LOW (ref 5–15)
BUN: 10 mg/dL (ref 6–20)
CO2: 25 mmol/L (ref 22–32)
Calcium: 8.3 mg/dL — ABNORMAL LOW (ref 8.9–10.3)
Chloride: 109 mmol/L (ref 98–111)
Creatinine, Ser: 0.58 mg/dL (ref 0.44–1.00)
GFR, Estimated: >60 mL/min (ref >60)
Glucose, Bld: 97 mg/dL (ref 70–99)
Potassium: 3.8 mmol/L (ref 3.5–5.1)
Sodium: 138 mmol/L (ref 135–145)
Total Bilirubin: 0.6 mg/dL (ref 0.3–1.2)
Total Protein: 7.3 g/dL (ref 6.5–8.1)

## 2022-08-06 LAB — STREP PNEUMONIAE URINARY ANTIGEN: Strep Pneumo Urinary Antigen: NEGATIVE

## 2022-08-06 LAB — RESPIRATORY PANEL BY PCR

## 2022-08-06 LAB — PHOSPHORUS: Phosphorus: 4.1 mg/dL (ref 2.5–4.6)

## 2022-08-06 LAB — MAGNESIUM: Magnesium: 2.3 mg/dL (ref 1.7–2.4)

## 2022-08-06 LAB — GLUCOSE, CAPILLARY: Glucose-Capillary: 97 mg/dL (ref 70–99)

## 2022-08-06 MED ORDER — ORAL CARE MOUTH RINSE
15.0000 mL | OROMUCOSAL | Status: DC
  Administered 2022-08-06 – 2022-08-12 (×9): 15 mL via OROMUCOSAL

## 2022-08-06 MED ORDER — HYDROXYZINE HCL 25 MG PO TABS
50.0000 mg | ORAL_TABLET | Freq: Three times a day (TID) | ORAL | Status: DC | PRN
  Administered 2022-08-06 – 2022-08-15 (×10): 50 mg via ORAL
  Filled 2022-08-06 (×10): qty 2

## 2022-08-06 MED ORDER — ARFORMOTEROL TARTRATE 15 MCG/2ML IN NEBU
15.0000 ug | INHALATION_SOLUTION | Freq: Two times a day (BID) | RESPIRATORY_TRACT | Status: DC
  Administered 2022-08-06 – 2022-08-16 (×20): 15 ug via RESPIRATORY_TRACT
  Filled 2022-08-06 (×21): qty 2

## 2022-08-06 MED ORDER — ORAL CARE MOUTH RINSE: 15.0000 mL | OROMUCOSAL | Status: DC | PRN

## 2022-08-06 MED ORDER — BUDESONIDE 0.25 MG/2ML IN SUSP
0.2500 mg | Freq: Two times a day (BID) | RESPIRATORY_TRACT | Status: DC
  Administered 2022-08-06 – 2022-08-09 (×6): 0.25 mg via RESPIRATORY_TRACT
  Filled 2022-08-06 (×7): qty 2

## 2022-08-06 MED ORDER — LISINOPRIL 10 MG PO TABS
10.0000 mg | ORAL_TABLET | Freq: Every day | ORAL | Status: DC
  Administered 2022-08-06 – 2022-08-14 (×9): 10 mg via ORAL
  Filled 2022-08-06 (×9): qty 1

## 2022-08-06 MED ORDER — LIDOCAINE 5 % EX PTCH
1.0000 | MEDICATED_PATCH | Freq: Every day | CUTANEOUS | Status: DC
  Administered 2022-08-06 – 2022-08-11 (×6): 1 via TRANSDERMAL
  Filled 2022-08-06 (×11): qty 1

## 2022-08-06 MED ORDER — ENOXAPARIN SODIUM 80 MG/0.8ML IJ SOSY
70.0000 mg | PREFILLED_SYRINGE | INTRAMUSCULAR | Status: DC
  Administered 2022-08-06 – 2022-08-15 (×10): 70 mg via SUBCUTANEOUS
  Filled 2022-08-06 (×10): qty 0.8

## 2022-08-06 MED ORDER — AMLODIPINE BESYLATE 5 MG PO TABS
5.0000 mg | ORAL_TABLET | Freq: Every day | ORAL | Status: DC
  Administered 2022-08-06 – 2022-08-14 (×9): 5 mg via ORAL
  Filled 2022-08-06 (×9): qty 1

## 2022-08-06 MED ORDER — MORPHINE SULFATE (PF) 2 MG/ML IV SOLN
1.0000 mg | INTRAVENOUS | Status: DC | PRN
  Administered 2022-08-06 – 2022-08-08 (×10): 1 mg via INTRAVENOUS
  Filled 2022-08-06 (×10): qty 1

## 2022-08-06 NOTE — Evaluation (Signed)
Clinical/Bedside Swallow Evaluation Patient Details  Name: Sarah Cervantes MRN: [id number] Date of Birth: 1981/05/02  Today's Date: 08/06/2022 Time: SLP Start Time (ACUTE ONLY): 1217 SLP Stop Time (ACUTE ONLY): 1229 SLP Time Calculation (min) (ACUTE ONLY): 12 min  Past Medical History:  Past Medical History:  Diagnosis Date   Cocaine abuse, daily use ([place]) 03/23/2018   ETOH abuse 03/23/2018   Heroin abuse ([place]) 03/23/2018   Past Surgical History: History reviewed. No pertinent surgical history. HPI:  Sarah Cervantes is a 42 y.o. female with medical history significant for but not limited too polysubstance abuse including cocaine daily use, ethanol abuse, heroin abuse, history of alcohol abuse, COPD, previous suicide attempts, depression and anxiety as well as bipolar 1 disorder and other comorbidities who presents to the ED after becoming more short of breath and having a fall and having suicidal intent.  Normally she is post ventilator and supplemental O2 via nasal cannula at baseline however has not used supplemental oxygen at least 3 days. Per chart recent admit to the inpatient psychiatric unit and presents with worsening shortness of breath.  CT of the chest which showed "Small right pleural effusion which appears partially loculated in the major fissure and lateral aspect of the right lower hemithorax. Multifocal ground-glass opacities throughout the bilateral lungs more prominent in the upper lobes. Findings are nonspecific and can be seen in the setting of multifocal pneumonia, pulmonary edema or hemorrhage. Mild  cardiomegaly. There are areas of atelectasis throughout the right lung with a small amount of airspace disease in the right middle lobe worrisome for infection."    Assessment / Plan / Recommendation  Clinical Impression  Pt came off BiPAP just prior to swallow assessment and RN states she intermittently is tachypneic and typically gives pt several hours before letting her  eat. Currently on nasal cannula and respirations appear stable. Most dentition is intact but missing several posterior. Her cough is strong. Coordination of swallow and respirations appeared adequate with sequential straw sips thin throughout evaluation. Mastication of solid was thorough, without residue or increased work of breathing. She did have one delayed cough at end of evaluation that did not appear isolated to particular consistency. Recommend continue regular texture, thin liquids, pills with thin (one at a time). ST will plan to follow up for briefly for continued safety and tolerance with recommendations. SLP Visit Diagnosis: Dysphagia, unspecified (R13.10)    Aspiration Risk  Mild aspiration risk    Diet Recommendation Regular;Thin liquid   Liquid Administration via: Straw;Cup Medication Administration: Whole meds with liquid Supervision: Patient able to self feed Compensations: Slow rate;Small sips/bites Postural Changes: Seated upright at 90 degrees    Other  Recommendations Oral Care Recommendations: Oral care BID    Recommendations for follow up therapy are one component of a multi-disciplinary discharge planning process, led by the attending physician.  Recommendations may be updated based on patient status, additional functional criteria and insurance authorization.  Follow up Recommendations No SLP follow up      Assistance Recommended at Discharge    Functional Status Assessment Patient has had a recent decline in their functional status and demonstrates the ability to make significant improvements in function in a reasonable and predictable amount of time.  Frequency and Duration min 1 x/week  2 weeks       Prognosis Prognosis for improved oropharyngeal function: Good      Swallow Study   General Date of Onset: 08/05/22 HPI: Sarah Cervantes is a 42 y.o. female with  medical history significant for but not limited too polysubstance abuse including cocaine daily  use, ethanol abuse, heroin abuse, history of alcohol abuse, COPD, previous suicide attempts, depression and anxiety as well as bipolar 1 disorder and other comorbidities who presents to the ED after becoming more short of breath and having a fall and having suicidal intent.  Normally she is post ventilator and supplemental O2 via nasal cannula at baseline however has not used supplemental oxygen at least 3 days. Per chart recent admit to the inpatient psychiatric unit and presents with worsening shortness of breath.  CT of the chest which showed "Small right pleural effusion which appears partially loculated in the major fissure and lateral aspect of the right lower hemithorax. Multifocal ground-glass opacities throughout the bilateral lungs more prominent in the upper lobes. Findings are nonspecific and can be seen in the setting of multifocal pneumonia, pulmonary edema or hemorrhage. Mild  cardiomegaly. There are areas of atelectasis throughout the right lung with a small amount of airspace disease in the right middle lobe worrisome for infection." Type of Study: Bedside Swallow Evaluation Previous Swallow Assessment:  (momr) Diet Prior to this Study: Regular;Thin liquids (Level 0) Temperature Spikes Noted: No Respiratory Status: Nasal cannula History of Recent Intubation: No Behavior/Cognition: Alert;Cooperative (cooperative with this therapist but not [employer]) Oral Cavity Assessment: Within Functional Limits Oral Care Completed by SLP: No Oral Cavity - Dentition: Missing dentition (mostly intact, missing several posterior) Vision: Functional for self-feeding Self-Feeding Abilities: Able to feed self Patient Positioning: Upright in bed Baseline Vocal Quality: Normal Volitional Cough: Strong Volitional Swallow: Able to elicit    Oral/Motor/Sensory Function Overall Oral Motor/Sensory Function: Within functional limits   Ice Chips Ice chips: Not tested   Thin Liquid Thin Liquid: Within  functional limits Presentation: Straw;Cup    Nectar Thick Nectar Thick Liquid: Not tested   Honey Thick Honey Thick Liquid: Not tested   Puree Puree: Within functional limits   Solid     Solid: Within functional limits      Sarah Cervantes 08/06/2022,12:57 PM

## 2022-08-06 NOTE — Progress Notes (Signed)
PROGRESS NOTE    Sarah Cervantes  [id number] DOB: 11/21/1980 DOA: 08/05/2022 PCP: Patient, No Pcp Per   Brief Narrative:  Sarah Cervantes is a 42 y.o. female with medical history significant for but not limited too polysubstance abuse including cocaine daily use, ethanol abuse, heroin abuse, history of alcohol abuse, COPD who supposed to be on 3 L supplemental oxygen chronically, previous suicide attempts, depression and anxiety as well as bipolar 1 disorder and other comorbidities who presents to the ED after becoming more short of breath and having a fall and having suicidal intent.  Of note history is taken from the chart and from the patient does she is extremely somnolent and falls asleep and becomes drowsy when speaking with her.  Reportedly the patient had a fall yesterday and has been more short of breath and has been having suicidal ideation.  After a fall she complained of left knee pain and neck pain status post fall.  Normally she is post ventilator and supplemental O2 via nasal cannula at baseline however has not used supplemental oxygen at least 3 days and this morning she took 4 Tylenol PMs and endorsed that "I just do not want to be here under more".  She presented for shortness of breath as well but she does endorse suicidal ideation with plans to overdose on pills.  She states that the shortness of breath got worse yesterday and she continues to be drowsy and falls asleep on my examination.  She recently was admitted to the inpatient psychiatric unit and presents with worsening shortness of breath.  She could not tell me if she is having chest pain or discomfort.  She been feeling poorly and "sick" for the last few days but did not elaborate on what she is feeling given her current condition.  Further evaluation and workup was done and she had a CT of the chest which showed "Small right pleural effusion which appears partially loculated in the major fissure and lateral aspect of the  right lower hemithorax. Multifocal ground-glass opacities throughout the bilateral lungs more prominent in the upper lobes. Findings are nonspecific and can be seen in the setting of multifocal pneumonia, pulmonary edema or hemorrhage. Mild  cardiomegaly. There are areas of atelectasis throughout the right lung with a small amount of airspace disease in the right middle lobe worrisome for infection."   Patient was placed on supplemental oxygen and continued be drowsy but did endorse suicidal ideation.  TRH was asked to admit this patient for acute on chronic hypoxic respiratory failure in the setting of multifocal pneumonia and partially loculated small pleural effusion as well as suicidal ideation with intent to overdose on pills.  Psychiatry was consulted for further evaluation given her suicidal attempt  **Interim History Patient was able to be weaned off of BiPAP for a little while and had to be placed on 6 L.  Continues to cough up quite a bit of productive sputum.  SLP has cleared the patient from their standpoint.  Psychiatry still recommends inpatient hospitalization.  Blood pressure remains elevated so she has been added back on her blood pressure medications and will avoid beta-blockers given her cocaine use.  Patient continued be somewhat anxious so her Atarax was also added back today.  Will need to continue monitor respiratory status and wean off of the BiPAP the 3 L of supplemental oxygen that she is supposed to be wearing.   Assessment and Plan:  Acute on chronic respiratory failure with hypoxia with a suspected  history of obesity hypoventilation syndrome -She was somnolent and drowsy and had increased respiratory rate and was breathing 30 times a minute but is improving and has been weaned to 6 L but then had to be placed back on BiPAP -CT scan shows multifocal pneumonia so placed her on empiric antibiotics with IV ceftriaxone and azithromycin Will start her on nebs with Xopenex and  Atrovent and add guaifenesin as well as a flutter valve and incentive spirometry -Given her somnolence and drowsiness will obtain an ABG and she may benefit from short-term BiPAP so this has been ordered -ABG Labs (Brief)          Component Value Date/Time    PHART 7.41 08/05/2022 1828    PCO2ART 46 08/05/2022 1828    PO2ART 59 (L) 08/05/2022 1828    HCO3 29.2 (H) 08/05/2022 1828    O2SAT 92.4 08/05/2022 1828    -She will need a repeat chest x-ray in the a.m; x-ray done today and showed "Unchanged appearance of the chest with RIGHT pleural effusion and scattered opacities/atelectasis within both mid and lower lungs." -Check blood cultures x 2 SpO2: 96 % O2 Flow Rate (L/min): 6 L/min FiO2 (%): 40 % RSV, influenza A/B and SARS-CoV-2 as well as the 20 respiratory panel were all negative and strep pneumo urinary antigen negative -Continue continuous pulse oximetry maintain O2 saturation greater 90% -Hold home inhalers with Symbicort and albuterol Continue supplemental oxygen via nasal cannula wean O2 as tolerated-she will need an amatory home O2 screen and repeat chest x-ray prior to discharge   Somnolence and drowsiness, improving somewhat -Could be in the setting of above however she did endorse to me that she did take 4 Tylenol PMs and unclear etiology what else she took -Check UDS and Poison control was called and they recommended close monitoring with cardiac monitoring as well as monitoring for hypotension, seizures -Poison control recommended acetaminophen level as well as salicylate level and following up on the UDS and recommended calling back once acetaminophen level was obtained and obtaining LFTs; LFTs are normal and salicylate level and acetaminophen level were negative  -Will give her 1 L bolus and placed on maintenance IV fluid hydration and move her to the stepdown unit for close observation and monitoring -Continue with BiPAP and wean as tolerated   Hypokalemia -Patient's  K+ Level Trend: Recent Labs  Lab 07/16/22 1320 07/22/22 0633 07/25/22 0555 08/05/22 1635 08/06/22 0327  K 4.3 4.2 4.4 3.3* 3.8  -Continue to Monitor and Replete as Necessary -Repeat CMP in the AM    Suicidal ideation with history of bipolar 1 disorder -She was somnolent and drowsy but endorsed suicidal ideation so she will need one-to-one sitter; she has been involuntary committed and psychiatry has been consulted and recommending inpatient psychiatric hospitalization when she is medically stable -Monitor closely for SI, HI, AVH -Hold her home psychiatric medications including fluoxetine, gabapentin as well as Abilify and prazosin for now and defer to psych -We have resumed her Atarax given her anxiety and we will defer the rest of medications to psychiatry Psychiatry still recommends inpatient hospitalization once medically stable   Essential Hypertension -Hold lisinopril and furosemide for now including amlodipine as well   GERD/GI prophylaxis -Continue with pantoprazole but changed to IV form   Thrombocytosis -Likely reactive in the setting of above Recent Labs  Lab 07/16/22 1320 07/22/22 0633 07/25/22 0555 08/05/22 1635 08/06/22 0327  PLT 392 328 331 465* 442*  -Continue monitor and trend and repeat CBC  in a.m.   Super Morbid Obesity/OHS -Complicates overall prognosis and care -Estimated body mass index is 53.21 kg/m as calculated from the following:   Height as of this encounter: '5\' 4"'$  (1.626 m).   Weight as of this encounter: 140.6 kg.  -Weight Loss and Dietary Counseling given  Hypoalbuminemia -Patient's Albumin Trend: Recent Labs  Lab 07/16/22 1320 08/05/22 1635 08/05/22 2112 08/06/22 0327  ALBUMIN 3.6 3.3* 3.2* 3.1*  -Continue to Monitor and Trend and repeat CMP in the AM   Fall -She will need PT and OT to further evaluate and treat when she is much more awake and alert in order for the morning   Polysubstance abuse -Monitor for withdrawal symptoms  given that she has a history of heroin abuse and daily cocaine use -Will not place her on a alcohol withdrawal protocol-given her extreme somnolence and drowsiness -Check UDS and was positive for cocaine, amphetamines and THC  DVT prophylaxis: Place TED hose Start: 08/05/22 2104    Code Status: Full Code Family Communication: No family currently at bedside  Disposition Plan:  Level of care: Stepdown Status is: Inpatient Remains inpatient appropriate because: Needs inpatient psychiatric hospitalization once deemed medically stable and off of BiPAP and weaned 3 L of her baseline oxygen   Consultants:  Psychiatry  Procedures:  As delineated as above  Antimicrobials:  Anti-infectives (From admission, onward)    Start     Dose/Rate Route Frequency Ordered Stop   08/06/22 1000  cefTRIAXone (ROCEPHIN) 2 g in sodium chloride 0.9 % 100 mL IVPB        2 g 200 mL/hr over 30 Minutes Intravenous Every 24 hours 08/05/22 2103 08/11/22 0959   08/05/22 2200  azithromycin (ZITHROMAX) 500 mg in sodium chloride 0.9 % 250 mL IVPB        500 mg 250 mL/hr over 60 Minutes Intravenous Every 24 hours 08/05/22 2103 08/10/22 2159   08/05/22 1630  cefTRIAXone (ROCEPHIN) 1 g in sodium chloride 0.9 % 100 mL IVPB        1 g 200 mL/hr over 30 Minutes Intravenous  Once 08/05/22 1617 08/05/22 1852   08/05/22 1630  azithromycin (ZITHROMAX) tablet 500 mg        500 mg Oral  Once 08/05/22 1617 08/05/22 1650       Subjective: Seen and examined at bedside and she is little bit more awake and alert today.  Had just been weaned off of BiPAP.  Is that she is doing little bit better but is coughing up thick brownish sputum.  Asking for diet and states she is feeling hungry.  No nausea or vomiting.  No other concerns or complaints this time.  Still feels depressed  Objective: Vitals:   08/06/22 1600 08/06/22 1700 08/06/22 1800 08/06/22 1900  BP: (!) 174/100 (!) 159/141 (!) 179/113 (!) 175/79  Pulse: 92 76 83 83   Resp:   (!) 32 (!) 30  Temp:      TempSrc:      SpO2: 92% 92% (!) 87% 96%  Weight:      Height:        Intake/Output Summary (Last 24 hours) at 08/06/2022 2004 Last data filed at 08/06/2022 1700 Gross per 24 hour  Intake 1718.2 ml  Output 600 ml  Net 1118.2 ml   Filed Weights   08/05/22 1211  Weight: (!) 140.6 kg   Examination: Physical Exam:  Constitutional: WN/WD super morbidly obese Caucasian female in no acute distress appears calm and a  little bit more awake Respiratory: Diminished to auscultation bilaterally with coarse breath sounds and has some rhonchi and some slight crackles.  No appreciable wheezing or rales. Normal respiratory effort and patient is not tachypenic. No accessory muscle use.  Wearing supplemental oxygen via nasal cannula at 6 L Cardiovascular: RRR, no murmurs / rubs / gallops. S1 and S2 auscultated.  Mild extremity edema Abdomen: Soft, non-tender, distended secondary to body habitus bowel sounds positive.  GU: Deferred. Musculoskeletal: No clubbing / cyanosis of digits/nails. No joint deformity upper and lower extremities.  Skin: No rashes, lesions, ulcers limited skin evaluation. No induration; Warm and dry.  Neurologic: CN 2-12 grossly intact with no focal deficits. Romberg sign and cerebellar reflexes not assessed.  Psychiatric: Normal judgment and insight. Alert and oriented x 3. Normal mood and appropriate affect.   Data Reviewed: I have personally reviewed following labs and imaging studies  CBC: Recent Labs  Lab 08/05/22 1635 08/06/22 0327  WBC 10.1 8.5  NEUTROABS 6.3 4.9  HGB 13.5 13.4  HCT 43.6 42.7  MCV 94.2 94.5  PLT 465* [id number]*   Basic Metabolic Panel: Recent Labs  Lab 08/05/22 1635 08/06/22 0327  NA 139 138  K 3.3* 3.8  CL 106 109  CO2 28 25  GLUCOSE 95 97  BUN 9 10  CREATININE 0.67 0.58  CALCIUM 8.2* 8.3*  MG  --  2.3  PHOS  --  4.1   GFR: Estimated Creatinine Clearance: 127.5 mL/min (by C-G formula based on SCr of  0.58 mg/dL). Liver Function Tests: Recent Labs  Lab 08/05/22 1635 08/05/22 2112 08/06/22 0327  AST 11* 16 13*  ALT '15 16 13  '$ ALKPHOS 52 54 56  BILITOT 0.7 0.5 0.6  PROT 7.3 7.3 7.3  ALBUMIN 3.3* 3.2* 3.1*   No results for input(s): "LIPASE", "AMYLASE" in the last 168 hours. No results for input(s): "AMMONIA" in the last 168 hours. Coagulation Profile: No results for input(s): "INR", "PROTIME" in the last 168 hours. Cardiac Enzymes: No results for input(s): "CKTOTAL", "CKMB", "CKMBINDEX", "TROPONINI" in the last 168 hours. BNP (last 3 results) No results for input(s): "PROBNP" in the last 8760 hours. HbA1C: No results for input(s): "HGBA1C" in the last 72 hours. CBG: Recent Labs  Lab 08/06/22 0755  GLUCAP 97   Lipid Profile: No results for input(s): "CHOL", "HDL", "LDLCALC", "TRIG", "CHOLHDL", "LDLDIRECT" in the last 72 hours. Thyroid Function Tests: Recent Labs    08/05/22 2112  TSH 1.359   Anemia Panel: No results for input(s): "VITAMINB12", "FOLATE", "FERRITIN", "TIBC", "IRON", "RETICCTPCT" in the last 72 hours. Sepsis Labs: Recent Labs  Lab 08/05/22 1635  LATICACIDVEN 0.9    Recent Results (from the past 240 hour(s))  Culture, blood (routine x 2)     Status: None (Preliminary result)   Collection Time: 08/05/22  4:35 PM   Specimen: BLOOD RIGHT FOREARM  Result Value Ref Range Status   Specimen Description   Final    BLOOD RIGHT FOREARM Performed at [facility], [place] [address] [place], [place] [address]    Special Requests   Final    BOTTLES DRAWN AEROBIC AND ANAEROBIC Blood Culture adequate volume Performed at [facility] [address], [place], [place] [address]    Culture   Final    NO GROWTH < 12 HOURS Performed at [facility] [address] [place], [place] [address]    Report Status PENDING  Incomplete  Resp panel by RT-PCR (RSV, Flu A&B,  Covid) Anterior Nasal Swab     Status: None   Collection Time:  08/05/22  6:51 PM   Specimen: Anterior Nasal Swab  Result Value Ref Range Status   SARS Coronavirus 2 by RT PCR NEGATIVE NEGATIVE Final    Comment: (NOTE) SARS-CoV-2 target nucleic acids are NOT DETECTED.  The SARS-CoV-2 RNA is generally detectable in upper respiratory specimens during the acute phase of infection. The lowest concentration of SARS-CoV-2 viral copies this assay can detect is 138 copies/mL. A negative result does not preclude SARS-Cov-2 infection and should not be used as the sole basis for treatment or other patient management decisions. A negative result may occur with  improper specimen collection/handling, submission of specimen other than nasopharyngeal swab, presence of viral mutation(s) within the areas targeted by this assay, and inadequate number of viral copies(<138 copies/mL). A negative result must be combined with clinical observations, patient history, and epidemiological information. The expected result is Negative.  Fact Sheet for Patients:  EntrepreneurPulse.com.au  Fact Sheet for Healthcare Providers:  IncredibleEmployment.be  This test is no t yet approved or cleared by the [place] FDA and  has been authorized for detection and/or diagnosis of SARS-CoV-2 by FDA under an Emergency Use Authorization (EUA). This EUA will remain  in effect (meaning this test can be used) for the duration of the COVID-19 declaration under Section 564(b)(1) of the Act, 21 U.S.C.section 360bbb-3(b)(1), unless the authorization is terminated  or revoked sooner.       Influenza A by PCR NEGATIVE NEGATIVE Final   Influenza B by PCR NEGATIVE NEGATIVE Final    Comment: (NOTE) The Xpert Xpress SARS-CoV-2/FLU/RSV plus assay is intended as an aid in the diagnosis of influenza from Nasopharyngeal swab specimens and should not be used as a sole basis for treatment. Nasal washings and aspirates are unacceptable for Xpert Xpress  SARS-CoV-2/FLU/RSV testing.  Fact Sheet for Patients: EntrepreneurPulse.com.au  Fact Sheet for Healthcare Providers: IncredibleEmployment.be  This test is not yet approved or cleared by the [place] FDA and has been authorized for detection and/or diagnosis of SARS-CoV-2 by FDA under an Emergency Use Authorization (EUA). This EUA will remain in effect (meaning this test can be used) for the duration of the COVID-19 declaration under Section 564(b)(1) of the Act, 21 U.S.C. section 360bbb-3(b)(1), unless the authorization is terminated or revoked.     Resp Syncytial Virus by PCR NEGATIVE NEGATIVE Final    Comment: (NOTE) Fact Sheet for Patients: EntrepreneurPulse.com.au  Fact Sheet for Healthcare Providers: IncredibleEmployment.be  This test is not yet approved or cleared by the [place] FDA and has been authorized for detection and/or diagnosis of SARS-CoV-2 by FDA under an Emergency Use Authorization (EUA). This EUA will remain in effect (meaning this test can be used) for the duration of the COVID-19 declaration under Section 564(b)(1) of the Act, 21 U.S.C. section 360bbb-3(b)(1), unless the authorization is terminated or revoked.  Performed at [facility], [place] [address], [place],  [address]   Respiratory (~20 pathogens) panel by PCR     Status: None   Collection Time: 08/05/22  6:51 PM   Specimen: Nasopharyngeal Swab; Respiratory  Result Value Ref Range Status   Adenovirus NOT DETECTED NOT DETECTED Final   Coronavirus 229E NOT DETECTED NOT DETECTED Final    Comment: (NOTE) The Coronavirus on the Respiratory Panel, DOES NOT test for the novel  Coronavirus (2019 nCoV)    Coronavirus HKU1 NOT DETECTED NOT DETECTED Final   Coronavirus NL63 NOT DETECTED  NOT DETECTED Final   Coronavirus OC43 NOT DETECTED NOT DETECTED Final   Metapneumovirus NOT DETECTED NOT  DETECTED Final   Rhinovirus / Enterovirus NOT DETECTED NOT DETECTED Final   Influenza A NOT DETECTED NOT DETECTED Final   Influenza B NOT DETECTED NOT DETECTED Final   Parainfluenza Virus 1 NOT DETECTED NOT DETECTED Final   Parainfluenza Virus 2 NOT DETECTED NOT DETECTED Final   Parainfluenza Virus 3 NOT DETECTED NOT DETECTED Final   Parainfluenza Virus 4 NOT DETECTED NOT DETECTED Final   Respiratory Syncytial Virus NOT DETECTED NOT DETECTED Final   Bordetella pertussis NOT DETECTED NOT DETECTED Final   Bordetella Parapertussis NOT DETECTED NOT DETECTED Final   Chlamydophila pneumoniae NOT DETECTED NOT DETECTED Final   Mycoplasma pneumoniae NOT DETECTED NOT DETECTED Final    Comment: Performed at [facility], [place] [address], [place], [place] [address]  Culture, blood (routine x 2)     Status: None (Preliminary result)   Collection Time: 08/05/22  9:04 PM   Specimen: BLOOD RIGHT HAND  Result Value Ref Range Status   Specimen Description   Final    BLOOD RIGHT HAND Performed at [facility], [place] [address], [place], [place] [address]    Special Requests   Final    BOTTLES DRAWN AEROBIC ONLY Blood Culture adequate volume Performed at [facility] [address] [place], [place] [address]    Culture   Final    NO GROWTH < 12 HOURS Performed at [facility] [address] [place], [place] [address]    Report Status PENDING  Incomplete  MRSA Next Gen by PCR, Nasal     Status: None   Collection Time: 08/05/22  9:45 PM   Specimen: Nasal Mucosa; Nasal Swab  Result Value Ref Range Status   MRSA by PCR Next Gen NOT DETECTED NOT DETECTED Final    Comment: (NOTE) The GeneXpert MRSA Assay (FDA approved for NASAL specimens only), is one component of a comprehensive MRSA colonization surveillance program. It is not intended to diagnose MRSA infection nor to guide or monitor treatment for MRSA infections. Test performance is not FDA approved in  patients less than 24 years old. Performed at [facility], [place] [address], [place], [place] [address]     Radiology Studies: X-ray chest PA and lateral  Result Date: 08/06/2022 CLINICAL DATA:  Shortness of breath and weakness. EXAM: CHEST - 2 VIEW COMPARISON:  08/05/2022 CT, radiograph and prior studies FINDINGS: Cardiomegaly again noted. RIGHT pleural effusion and scattered opacities/atelectasis within both mid and lower lungs are again noted. There is no evidence of pneumothorax or new definite pulmonary opacity. No acute bony abnormalities are present. IMPRESSION: Unchanged appearance of the chest with RIGHT pleural effusion and scattered opacities/atelectasis within both mid and lower lungs. Electronically Signed   By: Margarette Canada M.D.   On: 08/06/2022 10:15   CT Chest Wo Contrast  Result Date: 08/05/2022 CLINICAL DATA:  Respiratory illness. EXAM: CT CHEST WITHOUT CONTRAST TECHNIQUE: Multidetector CT imaging of the chest was performed following the standard protocol without IV contrast. RADIATION DOSE REDUCTION: This exam was performed according to the departmental dose-optimization program which includes automated exposure control, adjustment of the mA and/or kV according to patient size and/or use of iterative reconstruction technique. COMPARISON:  Chest x-ray same day FINDINGS: Cardiovascular: The heart is mildly enlarged. The aorta is normal in size. There is no pericardial effusion. Mediastinum/Nodes: No enlarged mediastinal or axillary  lymph nodes. Thyroid gland, trachea, and esophagus demonstrate no significant findings. Lungs/Pleura: There is a small right pleural effusion which appears partially loculated in the major fissure in lateral aspect of the right lower hemithorax. There are atelectatic changes in the right middle lobe, right upper lobe and right lower lobe with some airspace disease in the medial right middle lobe. Multifocal ground-glass opacities are seen  throughout the bilateral lungs more prominent in the upper lobes. There is no pneumothorax. Trachea and central airways are patent. Upper Abdomen: No acute abnormality. Cholecystectomy clips are present. Musculoskeletal: Degenerative changes affect the spine IMPRESSION: 1. Small right pleural effusion which appears partially loculated in the major fissure and lateral aspect of the right lower hemithorax. 2. Multifocal ground-glass opacities throughout the bilateral lungs more prominent in the upper lobes. Findings are nonspecific and can be seen in the setting of multifocal pneumonia, pulmonary edema or hemorrhage. 3. Mild cardiomegaly. 4. There are areas of atelectasis throughout the right lung with a small amount of airspace disease in the right middle lobe worrisome for infection. 5. Follow-up imaging recommended in 3-6 weeks to confirm resolution. Electronically Signed   By: Ronney Asters M.D.   On: 08/05/2022 15:10   DG Knee Complete 4 Views Left  Result Date: 08/05/2022 CLINICAL DATA:  Shortness of breath.  Fell yesterday. EXAM: LEFT KNEE - COMPLETE 4+ VIEW COMPARISON:  None Available. FINDINGS: There is mild degenerative change primarily of the MEDIAL and patellofemoral compartments. There is no acute fracture or subluxation. There is trace joint effusion. IMPRESSION: 1. Mild degenerative changes. 2. Trace joint effusion. Electronically Signed   By: Nolon Nations M.D.   On: 08/05/2022 13:33   DG Chest 2 View  Result Date: 08/05/2022 CLINICAL DATA:  Shortness of breath. EXAM: CHEST - 2 VIEW COMPARISON:  Chest radiograph 07/16/2022 FINDINGS: Limited exam due to technique. Monitoring leads overlie the patient. Stable cardiomegaly. Interval moderate right pleural effusion and underlying consolidation within the right lower lung. No definite pneumothorax. Thoracic spine degenerative changes. IMPRESSION: 1. Limited exam due to technique. 2. Moderate right pleural effusion with underlying consolidation.  This is poorly evaluated on current exam. Consider repeat PA and lateral chest radiograph or chest CT for further evaluation depending upon clinical concern. Electronically Signed   By: Lovey Newcomer M.D.   On: 08/05/2022 13:31    Scheduled Meds:  amLODipine  5 mg Oral Daily   arformoterol  15 mcg Nebulization BID   budesonide (PULMICORT) nebulizer solution  0.25 mg Nebulization BID   Chlorhexidine Gluconate Cloth  6 each Topical Daily   enoxaparin (LOVENOX) injection  70 mg Subcutaneous Q24H   guaiFENesin  1,200 mg Oral BID   ipratropium  0.5 mg Nebulization Q6H   levalbuterol  0.63 mg Nebulization Q6H   lidocaine  1 patch Transdermal Daily   lisinopril  10 mg Oral Daily   mouth rinse  15 mL Mouth Rinse 4 times per day   pantoprazole (PROTONIX) IV  40 mg Intravenous Q24H   Continuous Infusions:  azithromycin Stopped (08/06/22 0017)   cefTRIAXone (ROCEPHIN)  IV Stopped (08/06/22 [id number])    LOS: 1 day   Raiford Noble, DO [facility] Available via Epic secure chat 7am-7pm After these hours, please refer to coverage provider listed on [email or web address] 08/06/2022, 8:04 PM

## 2022-08-06 NOTE — Progress Notes (Signed)
[facility] Psych ED Progress Note  08/06/2022 2:49 PM Sarah Cervantes  MRN:  [id number]   Principal Problem: Multifocal pneumonia Diagnosis:  Principal Problem:   Multifocal pneumonia Active Problems:   Substance induced mood disorder ([place])   Suicidal ideation   ED Assessment Time Calculation: Start Time: 1300 Stop Time: 1320 Total Time in Minutes (Assessment Completion): 20   Subjective: On evaluation today, patient is lying in a hospital bed, and has been admitted to inpatient medical for pneumonia. Patient has been placed on supplemental oxygen and continues to be drowsy but continues to endorse suicidal ideation. She is calm and cooperative during this assessment. Her eye contact is poor, as she keeps going to sleep. Speech is clear and coherent, normal pace and decreased volume. She reports her mood is "feeling bad".  Affect is congruent with mood.  Thought process is coherent and disorganized. Thought content is logical.  She denies auditory and visual hallucinations.  No indication that she is responding to internal stimuli during this assessment.  No delusions elicited during this assessment. She denies homicidal ideations.  Patient continues to endorse suicidal ideations, stating that "I just hate my life "in a plan to take a bunch of pills.  When asked about drugs or alcohol, patient states she does cocaine, provider asked how much, patient stated "a lot ".  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  Patient also complains of back and neck pain, informed her nurse. Allowed patient to get some rest, as she was on oxygen, and displaying tachypnea, and stated she was tired.   Past Psychiatric History:  history of PTSD, insomnia, anxiety, polysubstance abuse, bipolar depressed type and cocaine use disorder.     [place] Scale:  Flowsheet Row ED to Hosp-Admission (Current) from 08/05/2022 in [facility] ED to Hosp-Admission  (Discharged) from 07/16/2022 in [facility] ED from 05/27/2022 in [facility] Emergency Department at [place] High Risk Moderate Risk High Risk       Past Medical History:  Past Medical History:  Diagnosis Date   Cocaine abuse, daily use ([place]) 03/23/2018   ETOH abuse 03/23/2018   Heroin abuse ([place]) 03/23/2018   History reviewed. No pertinent surgical history. Family History: History reviewed. No pertinent family history.  Social History:  Social History   Substance and Sexual Activity  Alcohol Use Yes   Alcohol/week: 12.0 - 15.0 standard drinks of alcohol   Types: 12 - 15 Cans of beer per week   Comment: drinking daily for the past week     Social History   Substance and Sexual Activity  Drug Use Yes   Types: Cocaine    Social History   Socioeconomic History   Marital status: Single    Spouse name: Not on file   Number of children: Not on file   Years of education: Not on file   Highest education level: Not on file  Occupational History   Not on file  Tobacco Use   Smoking status: Every Day    Packs/day: 0.50    Years: 30.00    Total pack years: 15.00    Types: Cigarettes    Last attempt to quit: 01/12/2022    Years since quitting: 0.5   Smokeless tobacco: Never  Vaping Use   Vaping Use: Never used  Substance and Sexual Activity   Alcohol use: Yes    Alcohol/week: 12.0 - 15.0 standard  drinks of alcohol    Types: 12 - 15 Cans of beer per week    Comment: drinking daily for the past week   Drug use: Yes    Types: Cocaine   Sexual activity: Yes    Birth control/protection: None  Other Topics Concern   Not on file  Social History Narrative   Not on file   Social Determinants of Health   Financial Resource Strain: Not on file  Food Insecurity: Food Insecurity Present (08/06/2022)   Hunger Vital Sign    Worried About Running Out of Food in the Last Year: Sometimes true    Ran Out of  Food in the Last Year: Sometimes true  Transportation Needs: No Transportation Needs (08/06/2022)   PRAPARE - Hydrologist (Medical): No    Lack of Transportation (Non-Medical): No  Physical Activity: Not on file  Stress: Not on file  Social Connections: Not on file    Sleep: Good  Appetite:   Patient unable to eat at this time  Current Medications: Current Facility-Administered Medications  Medication Dose Route Frequency Provider Last Rate Last Admin   amLODipine (NORVASC) tablet 5 mg  5 mg Oral Daily Sheikh, Omair Latif, DO       arformoterol [facility]) nebulizer solution 15 mcg  15 mcg Nebulization BID Sheikh, Omair [place], DO   15 mcg at 08/06/22 1107   azithromycin (ZITHROMAX) 500 mg in sodium chloride 0.9 % 250 mL IVPB  500 mg Intravenous Q24H Sheikh, Omair [place], DO   Stopped at 08/06/22 0017   bisacodyl (DULCOLAX) suppository 10 mg  10 mg Rectal Daily PRN Raiford Noble Latif, DO       budesonide (PULMICORT) nebulizer solution 0.25 mg  0.25 mg Nebulization BID Sheikh, Omair Latif, DO   0.25 mg at 08/06/22 1107   cefTRIAXone (ROCEPHIN) 2 g in sodium chloride 0.9 % 100 mL IVPB  2 g Intravenous Q24H Sheikh, Omair [place], DO   Stopped at 08/06/22 [id number]   Chlorhexidine Gluconate Cloth 2 % PADS 6 each  6 each Topical Daily Raiford Noble [place], DO   6 each at 08/06/22 0913   enoxaparin (LOVENOX) injection 70 mg  70 mg Subcutaneous Q24H Karren Cobble, RPH       guaiFENesin (MUCINEX) 12 hr tablet 1,200 mg  1,200 mg Oral BID Raiford Noble [place], DO   1,200 mg at 08/06/22 [id number]   hydrALAZINE (APRESOLINE) injection 10 mg  10 mg Intravenous Q6H PRN Raiford Noble Latif, DO   10 mg at 08/06/22 1006   hydrOXYzine (ATARAX) tablet 50 mg  50 mg Oral TID PRN Raiford Noble Latif, DO       ipratropium (ATROVENT) nebulizer solution 0.5 mg  0.5 mg Nebulization Q6H Sheikh, Omair [place], DO   0.5 mg at 08/06/22 1340   levalbuterol (XOPENEX) nebulizer solution 0.63 mg  0.63 mg  Nebulization Q6H Sheikh, Omair Latif, DO   0.63 mg at 08/06/22 1340   lidocaine (LIDODERM) 5 % 1 patch  1 patch Transdermal Daily Sheikh, Omair Latif, DO       lip balm (CARMEX) ointment 1 Application  1 Application Topical PRN Sheikh, Omair Latif, DO       lisinopril (ZESTRIL) tablet 10 mg  10 mg Oral Daily Sheikh, Omair Latif, DO       morphine (PF) 2 MG/ML injection 1 mg  1 mg Intravenous Q3H PRN Kerney Elbe, DO       Oral care mouth rinse  15 mL Mouth Rinse 4 times per day Kerney Elbe, DO       Oral care mouth rinse  15 mL Mouth Rinse PRN Alfredia Ferguson, Omair Latif, DO       pantoprazole (PROTONIX) injection 40 mg  40 mg Intravenous Q24H Sheikh, Omair [place], DO   40 mg at 08/05/22 2312   polyethylene glycol (MIRALAX / GLYCOLAX) packet 17 g  17 g Oral Daily PRN Sheikh, Georgina Quint Latif, DO       prochlorperazine (COMPAZINE) injection 10 mg  10 mg Intravenous Q6H PRN Raiford Noble Latif, DO        Lab Results:  Results for orders placed or performed during the hospital encounter of 08/05/22 (from the past 48 hour(s))  Basic metabolic panel     Status: Abnormal   Collection Time: 08/05/22  4:35 PM  Result Value Ref Range   Sodium 139 135 - 145 mmol/L   Potassium 3.3 (L) 3.5 - 5.1 mmol/L   Chloride 106 98 - 111 mmol/L   CO2 28 22 - 32 mmol/L   Glucose, Bld 95 70 - 99 mg/dL    Comment: Glucose reference range applies only to samples taken after fasting for at least 8 hours.   BUN 9 6 - 20 mg/dL   Creatinine, Ser 0.67 0.44 - 1.00 mg/dL   Calcium 8.2 (L) 8.9 - 10.3 mg/dL   GFR, Estimated >60 >60 mL/min    Comment: (NOTE) Calculated using the CKD-EPI Creatinine Equation (2021)    Anion gap 5 5 - 15    Comment: Performed at [facility], [place] [address] [place], [place] [address]  CBC with Differential     Status: Abnormal   Collection Time: 08/05/22  4:35 PM  Result Value Ref Range   WBC 10.1 4.0 - 10.5 K/uL   RBC 4.63 3.87 - 5.11 MIL/uL   Hemoglobin 13.5 12.0  - 15.0 g/dL   HCT 43.6 36.0 - 46.0 %   MCV 94.2 80.0 - 100.0 fL   MCH 29.2 26.0 - 34.0 pg   MCHC 31.0 30.0 - 36.0 g/dL   RDW 17.3 (H) 11.5 - 15.5 %   Platelets 465 (H) 150 - 400 K/uL   nRBC 0.0 0.0 - 0.2 %   Neutrophils Relative % 63 %   Neutro Abs 6.3 1.7 - 7.7 K/uL   Lymphocytes Relative 27 %   Lymphs Abs 2.7 0.7 - 4.0 K/uL   Monocytes Relative 7 %   Monocytes Absolute 0.7 0.1 - 1.0 K/uL   Eosinophils Relative 3 %   Eosinophils Absolute 0.3 0.0 - 0.5 K/uL   Basophils Relative 0 %   Basophils Absolute 0.0 0.0 - 0.1 K/uL   Immature Granulocytes 0 %   Abs Immature Granulocytes 0.04 0.00 - 0.07 K/uL    Comment: Performed at [facility], [place] [address] [place], [place] [address]  Culture, blood (routine x 2)     Status: None (Preliminary result)   Collection Time: 08/05/22  4:35 PM   Specimen: BLOOD RIGHT FOREARM  Result Value Ref Range   Specimen Description      BLOOD RIGHT FOREARM Performed at [facility], [place] [address] [place], [place] [address]    Special Requests      BOTTLES DRAWN AEROBIC AND ANAEROBIC Blood Culture adequate volume Performed at [place] [address], [place], [place] [address]    Culture      NO GROWTH < 12  HOURS Performed at [facility], [place] [address] [place], [place] [address]    Report Status PENDING   Lactic acid, plasma     Status: None   Collection Time: 08/05/22  4:35 PM  Result Value Ref Range   Lactic Acid, Venous 0.9 0.5 - 1.9 mmol/L    Comment: Performed at [facility], [place] [address] [place], [place] [address]  Hepatic function panel     Status: Abnormal   Collection Time: 08/05/22  4:35 PM  Result Value Ref Range   Total Protein 7.3 6.5 - 8.1 g/dL   Albumin 3.3 (L) 3.5 - 5.0 g/dL   AST 11 (L) 15 - 41 U/L   ALT 15 0 - 44 U/L   Alkaline Phosphatase 52 38 - 126 U/L   Total Bilirubin 0.7 0.3 - 1.2 mg/dL   Bilirubin, Direct <0.1 0.0 - 0.2 mg/dL    Indirect Bilirubin NOT CALCULATED 0.3 - 0.9 mg/dL    Comment: Performed at [facility], [place] [address], [place], [place] [address]  Blood gas, arterial     Status: Abnormal   Collection Time: 08/05/22  6:28 PM  Result Value Ref Range   pH, Arterial 7.41 7.35 - 7.45   pCO2 arterial 46 32 - 48 mmHg   pO2, Arterial 59 (L) 83 - 108 mmHg   Bicarbonate 29.2 (H) 20.0 - 28.0 mmol/L   Acid-Base Excess 3.8 (H) 0.0 - 2.0 mmol/L   O2 Saturation 92.4 %   Patient temperature 37.0    Allens test (pass/fail) PASS PASS    Comment: Performed at [facility], [place] [address], [place],  [address]  Resp panel by RT-PCR (RSV, Flu A&B, Covid) Anterior Nasal Swab     Status: None   Collection Time: 08/05/22  6:51 PM   Specimen: Anterior Nasal Swab  Result Value Ref Range   SARS Coronavirus 2 by RT PCR NEGATIVE NEGATIVE    Comment: (NOTE) SARS-CoV-2 target nucleic acids are NOT DETECTED.  The SARS-CoV-2 RNA is generally detectable in upper respiratory specimens during the acute phase of infection. The lowest concentration of SARS-CoV-2 viral copies this assay can detect is 138 copies/mL. A negative result does not preclude SARS-Cov-2 infection and should not be used as the sole basis for treatment or other patient management decisions. A negative result may occur with  improper specimen collection/handling, submission of specimen other than nasopharyngeal swab, presence of viral mutation(s) within the areas targeted by this assay, and inadequate number of viral copies(<138 copies/mL). A negative result must be combined with clinical observations, patient history, and epidemiological information. The expected result is Negative.  Fact Sheet for Patients:  EntrepreneurPulse.com.au  Fact Sheet for Healthcare Providers:  IncredibleEmployment.be  This test is no t yet approved or cleared by the [place] FDA and  has been  authorized for detection and/or diagnosis of SARS-CoV-2 by FDA under an Emergency Use Authorization (EUA). This EUA will remain  in effect (meaning this test can be used) for the duration of the COVID-19 declaration under Section 564(b)(1) of the Act, 21 U.S.C.section 360bbb-3(b)(1), unless the authorization is terminated  or revoked sooner.       Influenza A by PCR NEGATIVE NEGATIVE   Influenza B by PCR NEGATIVE NEGATIVE    Comment: (NOTE) The Xpert Xpress SARS-CoV-2/FLU/RSV plus assay is intended as an aid in the diagnosis of influenza from Nasopharyngeal swab specimens and should not be used as a sole basis for  treatment. Nasal washings and aspirates are unacceptable for Xpert Xpress SARS-CoV-2/FLU/RSV testing.  Fact Sheet for Patients: EntrepreneurPulse.com.au  Fact Sheet for Healthcare Providers: IncredibleEmployment.be  This test is not yet approved or cleared by the [place] FDA and has been authorized for detection and/or diagnosis of SARS-CoV-2 by FDA under an Emergency Use Authorization (EUA). This EUA will remain in effect (meaning this test can be used) for the duration of the COVID-19 declaration under Section 564(b)(1) of the Act, 21 U.S.C. section 360bbb-3(b)(1), unless the authorization is terminated or revoked.     Resp Syncytial Virus by PCR NEGATIVE NEGATIVE    Comment: (NOTE) Fact Sheet for Patients: EntrepreneurPulse.com.au  Fact Sheet for Healthcare Providers: IncredibleEmployment.be  This test is not yet approved or cleared by the [place] FDA and has been authorized for detection and/or diagnosis of SARS-CoV-2 by FDA under an Emergency Use Authorization (EUA). This EUA will remain in effect (meaning this test can be used) for the duration of the COVID-19 declaration under Section 564(b)(1) of the Act, 21 U.S.C. section 360bbb-3(b)(1), unless the authorization is  terminated or revoked.  Performed at [facility], [place] [address], [place], [place] [address]   Respiratory (~20 pathogens) panel by PCR     Status: None   Collection Time: 08/05/22  6:51 PM   Specimen: Nasopharyngeal Swab; Respiratory  Result Value Ref Range   Adenovirus NOT DETECTED NOT DETECTED   Coronavirus 229E NOT DETECTED NOT DETECTED    Comment: (NOTE) The Coronavirus on the Respiratory Panel, DOES NOT test for the novel  Coronavirus (2019 nCoV)    Coronavirus HKU1 NOT DETECTED NOT DETECTED   Coronavirus NL63 NOT DETECTED NOT DETECTED   Coronavirus OC43 NOT DETECTED NOT DETECTED   Metapneumovirus NOT DETECTED NOT DETECTED   Rhinovirus / Enterovirus NOT DETECTED NOT DETECTED   Influenza A NOT DETECTED NOT DETECTED   Influenza B NOT DETECTED NOT DETECTED   Parainfluenza Virus 1 NOT DETECTED NOT DETECTED   Parainfluenza Virus 2 NOT DETECTED NOT DETECTED   Parainfluenza Virus 3 NOT DETECTED NOT DETECTED   Parainfluenza Virus 4 NOT DETECTED NOT DETECTED   Respiratory Syncytial Virus NOT DETECTED NOT DETECTED   Bordetella pertussis NOT DETECTED NOT DETECTED   Bordetella Parapertussis NOT DETECTED NOT DETECTED   Chlamydophila pneumoniae NOT DETECTED NOT DETECTED   Mycoplasma pneumoniae NOT DETECTED NOT DETECTED    Comment: Performed at [facility], [place] [address], [place], [place] [address]  Rapid urine drug screen (hospital performed)     Status: Abnormal   Collection Time: 08/05/22  7:19 PM  Result Value Ref Range   Opiates NONE DETECTED NONE DETECTED   Cocaine POSITIVE (A) NONE DETECTED   Benzodiazepines NONE DETECTED NONE DETECTED   Amphetamines POSITIVE (A) NONE DETECTED   Tetrahydrocannabinol POSITIVE (A) NONE DETECTED   Barbiturates NONE DETECTED NONE DETECTED    Comment: (NOTE) DRUG SCREEN FOR MEDICAL PURPOSES ONLY.  IF CONFIRMATION IS NEEDED FOR ANY PURPOSE, NOTIFY LAB WITHIN 5 DAYS.  LOWEST DETECTABLE LIMITS FOR URINE DRUG  SCREEN Drug Class                     Cutoff (ng/mL) Amphetamine and metabolites    1000 Barbiturate and metabolites    200 Benzodiazepine                 200 Opiates and metabolites        300 Cocaine and metabolites  300 THC                            50 Performed at [facility], [place] [address] [place], [place] [address]   Culture, blood (routine x 2)     Status: None (Preliminary result)   Collection Time: 08/05/22  9:04 PM   Specimen: BLOOD RIGHT HAND  Result Value Ref Range   Specimen Description      BLOOD RIGHT HAND Performed at [facility], [place] [address], [place], [place] [address]    Special Requests      BOTTLES DRAWN AEROBIC ONLY Blood Culture adequate volume Performed at [place] [address], [place], [place] [address]    Culture      NO GROWTH < 12 HOURS Performed at [place] [address], [place], [place] [address]    Report Status PENDING   Brain natriuretic peptide     Status: None   Collection Time: 08/05/22  9:12 PM  Result Value Ref Range   B Natriuretic Peptide 49.6 0.0 - 100.0 pg/mL    Comment: Performed at [facility], [place] [address], [place], [place] [address]  Acetaminophen level     Status: Abnormal   Collection Time: 08/05/22  9:12 PM  Result Value Ref Range   Acetaminophen (Tylenol), Serum <10 (L) 10 - 30 ug/mL    Comment: (NOTE) Therapeutic concentrations vary significantly. A range of 10-30 ug/mL  may be an effective concentration for many patients. However, some  are best treated at concentrations outside of this range. Acetaminophen concentrations >150 ug/mL at 4 hours after ingestion  and >50 ug/mL at 12 hours after ingestion are often associated with  toxic reactions.  Performed at [facility], [place] [address] [place], [place] [id number]   Salicylate level     Status: Abnormal   Collection Time: 08/05/22  9:12 PM  Result  Value Ref Range   Salicylate Lvl [id number] (L) 7.0 - 30.0 mg/dL    Comment: Performed at [facility], [place] [address] [place], [place] [address]  Hepatic function panel     Status: Abnormal   Collection Time: 08/05/22  9:12 PM  Result Value Ref Range   Total Protein 7.3 6.5 - 8.1 g/dL   Albumin 3.2 (L) 3.5 - 5.0 g/dL   AST 16 15 - 41 U/L   ALT 16 0 - 44 U/L   Alkaline Phosphatase 54 38 - 126 U/L   Total Bilirubin 0.5 0.3 - 1.2 mg/dL   Bilirubin, Direct <0.1 0.0 - 0.2 mg/dL   Indirect Bilirubin NOT CALCULATED 0.3 - 0.9 mg/dL    Comment: Performed at [facility], [place] [address] [place], [place] [address]  TSH     Status: None   Collection Time: 08/05/22  9:12 PM  Result Value Ref Range   TSH 1.359 0.350 - 4.500 uIU/mL    Comment: Performed by a 3rd Generation assay with a functional sensitivity of <=0.01 uIU/mL. Performed at [facility], [place] [address], [place], [place] [address]   MRSA Next Gen by PCR, Nasal     Status: None   Collection Time: 08/05/22  9:45 PM   Specimen: Nasal Mucosa; Nasal Swab  Result Value Ref Range   MRSA by PCR Next Gen NOT DETECTED NOT DETECTED    Comment: (NOTE) The GeneXpert MRSA Assay (FDA  approved for NASAL specimens only), is one component of a comprehensive MRSA colonization surveillance program. It is not intended to diagnose MRSA infection nor to guide or monitor treatment for MRSA infections. Test performance is not FDA approved in patients less than 53 years old. Performed at [facility], [place] [address], [place], [place] [address]   Strep pneumoniae urinary antigen     Status: None   Collection Time: 08/05/22  9:47 PM  Result Value Ref Range   Strep Pneumo Urinary Antigen NEGATIVE NEGATIVE    Comment:        Infection due to S. pneumoniae cannot be absolutely ruled out since the antigen present may be below the detection limit of the test. Performed at [facility], [address] [place], [place] [address]   CBC with Differential/Platelet     Status: Abnormal   Collection Time: 08/06/22  3:27 AM  Result Value Ref Range   WBC 8.5 4.0 - 10.5 K/uL   RBC 4.52 3.87 - 5.11 MIL/uL   Hemoglobin 13.4 12.0 - 15.0 g/dL   HCT 42.7 36.0 - 46.0 %   MCV 94.5 80.0 - 100.0 fL   MCH 29.6 26.0 - 34.0 pg   MCHC 31.4 30.0 - 36.0 g/dL   RDW 17.5 (H) 11.5 - 15.5 %   Platelets 442 (H) 150 - 400 K/uL   nRBC 0.0 0.0 - 0.2 %   Neutrophils Relative % 59 %   Neutro Abs 4.9 1.7 - 7.7 K/uL   Lymphocytes Relative 29 %   Lymphs Abs 2.4 0.7 - 4.0 K/uL   Monocytes Relative 8 %   Monocytes Absolute 0.7 0.1 - 1.0 K/uL   Eosinophils Relative 4 %   Eosinophils Absolute 0.4 0.0 - 0.5 K/uL   Basophils Relative 0 %   Basophils Absolute 0.0 0.0 - 0.1 K/uL   Immature Granulocytes 0 %   Abs Immature Granulocytes 0.03 0.00 - 0.07 K/uL    Comment: Performed at [facility], [place] [address], [place], [place] [address]  Comprehensive metabolic panel     Status: Abnormal   Collection Time: 08/06/22  3:27 AM  Result Value Ref Range   Sodium 138 135 - 145 mmol/L   Potassium 3.8 3.5 - 5.1 mmol/L   Chloride 109 98 - 111 mmol/L   CO2 25 22 - 32 mmol/L   Glucose, Bld 97 70 - 99 mg/dL    Comment: Glucose reference range applies only to samples taken after fasting for at least 8 hours.   BUN 10 6 - 20 mg/dL   Creatinine, Ser 0.58 0.44 - 1.00 mg/dL   Calcium 8.3 (L) 8.9 - 10.3 mg/dL   Total Protein 7.3 6.5 - 8.1 g/dL   Albumin 3.1 (L) 3.5 - 5.0 g/dL   AST 13 (L) 15 - 41 U/L   ALT 13 0 - 44 U/L   Alkaline Phosphatase 56 38 - 126 U/L   Total Bilirubin 0.6 0.3 - 1.2 mg/dL   GFR, Estimated >60 >60 mL/min    Comment: (NOTE) Calculated using the CKD-EPI Creatinine Equation (2021)    Anion gap 4 (L) 5 - 15    Comment: Performed at [facility], [place] [address], [place], [place] [address]  Phosphorus     Status: None   Collection Time: 08/06/22   3:27 AM  Result Value Ref Range   Phosphorus 4.1 2.5 - 4.6 mg/dL    Comment: Performed at [facility], [place] [address], [place], [place] [address]  Magnesium     Status: None   Collection Time: 08/06/22  3:27 AM  Result Value Ref Range   Magnesium 2.3 1.7 - 2.4 mg/dL    Comment: Performed at [facility], [place] [address] [place], [place] [address]  Glucose, capillary     Status: None   Collection Time: 08/06/22  7:55 AM  Result Value Ref Range   Glucose-Capillary 97 70 - 99 mg/dL    Comment: Glucose reference range applies only to samples taken after fasting for at least 8 hours.    Blood Alcohol level:  Lab Results  Component Value Date   [facility] <10 07/16/2022   ETH <10 05/26/2022    Physical Findings:  CIWA:    COWS:     Musculoskeletal:  Patient observed resting in bed.   Psychiatric Specialty Exam:  Presentation  General Appearance:  Disheveled  Eye Contact: Good  Speech: Clear and Coherent  Speech Volume: Normal  Handedness: Right   Mood and Affect  Mood: Euthymic  Affect: Appropriate   Thought Process  Thought Processes: Coherent  Descriptions of Associations:Intact  Orientation:Partial  Thought Content:Logical  History of Schizophrenia/Schizoaffective disorder:No  Duration of Psychotic Symptoms:N/A  Hallucinations:Hallucinations: None  Ideas of Reference:None  Suicidal Thoughts:Suicidal Thoughts: Yes, Active SI Active Intent and/or Plan: With Plan  Homicidal Thoughts:Homicidal Thoughts: No   Sensorium  Memory: Immediate Fair; Remote Fair  Judgment: Fair  Insight: Fair   Materials engineer: Fair  Attention Span: Fair  Recall: [employer] of Knowledge: Fair  Language: Good   Psychomotor Activity  Psychomotor Activity: Psychomotor Activity: Normal   Assets  Assets: Communication Skills   Sleep  Sleep: Sleep: Fair    Physical  Exam: Physical Exam Cardiovascular:     Rate and Rhythm: Normal rate.  Musculoskeletal:        General: Normal range of motion.  Neurological:     Mental Status: She is alert.  Psychiatric:        Attention and Perception: Attention normal.        Mood and Affect: Mood is depressed. Affect is flat.        Speech: Speech normal.        Behavior: Behavior is slowed. Behavior is cooperative.        Thought Content: Thought content includes suicidal ideation. Thought content includes suicidal plan.        Cognition and Memory: Cognition is impaired.        Judgment: Judgment is inappropriate.    Review of Systems  Constitutional: Negative.   HENT: Negative.    Respiratory: Negative.    Psychiatric/Behavioral:  Positive for depression, substance abuse and suicidal ideas.    Blood pressure (!) 184/115, pulse 66, temperature 99 F (37.2 C), resp. rate (!) 32, height '5\' 4"'$  (1.626 m), weight (!) 140.6 kg, last menstrual period 07/01/2021, SpO2 91 %. Body mass index is 53.21 kg/m.    Medical Decision Making: Patient continues to require inpatient Psychiatric hospitalization. Daily contact with patient to assess and evaluate symptoms and progress in treatment. Patient to remain Inpatient as she is not medically stable, B/P 184/115 and requires nasal cannula oxygen, O2 is 93%.  Patient has not been restarted back on her home psychiatric medications patient has been given medication Atarax for anxiety.       Deryl Ports MOTLEY-MANGRUM, PMHNP 08/06/2022, 2:49 PM

## 2022-08-06 NOTE — Progress Notes (Addendum)
Per [name], PMHNP Patient continues to require inpatient Psychiatric hospitalization. Daily contact with patient to assess and evaluate symptoms and progress in treatment. Patient to remain Inpatient as she is not medically stable, B/P 184/115 and requires nasal cannula oxygen, O2 is 93%.  Patient has not been restarted back on her home psychiatric medications patient has been given medication Atarax for anxiety.   This CSW will leave pt on BH shift report at this time. Pt is listed as inpatient at this time per provider's note on this date. Medical floor psych team/ psych consult team will follow, however CSW and Disposition team will assist with seeking placement. CSW will await until pt is medically cleared to seek inpatient behavioral health placement.   Benjaman Kindler, MSW, [facility] 08/06/2022 4:33 PM

## 2022-08-06 NOTE — Plan of Care (Signed)
  Problem: Activity: Goal: Ability to tolerate increased activity will improve Outcome: Progressing   Problem: Clinical Measurements: Goal: Ability to maintain a body temperature in the normal range will improve Outcome: Progressing   Problem: Respiratory: Goal: Ability to maintain adequate ventilation will improve Outcome: Progressing Goal: Ability to maintain a clear airway will improve Outcome: Progressing   Problem: Education: Goal: Knowledge of General Education information will improve Description: Including pain rating scale, medication(s)/side effects and non-pharmacologic comfort measures Outcome: Progressing   Problem: Health Behavior/Discharge Planning: Goal: Ability to manage health-related needs will improve Outcome: Progressing   Problem: Clinical Measurements: Goal: Ability to maintain clinical measurements within normal limits will improve Outcome: Progressing Goal: Will remain free from infection Outcome: Progressing Goal: Diagnostic test results will improve Outcome: Progressing Goal: Respiratory complications will improve Outcome: Progressing Goal: Cardiovascular complication will be avoided Outcome: Progressing   Problem: Activity: Goal: Risk for activity intolerance will decrease Outcome: Progressing   Problem: Nutrition: Goal: Adequate nutrition will be maintained Outcome: Progressing   Problem: Coping: Goal: Level of anxiety will decrease Outcome: Progressing   Problem: Elimination: Goal: Will not experience complications related to bowel motility Outcome: Progressing Goal: Will not experience complications related to urinary retention Outcome: Progressing   Problem: Pain Managment: Goal: General experience of comfort will improve Outcome: Progressing   Problem: Safety: Goal: Ability to remain free from injury will improve Outcome: Progressing   Problem: Skin Integrity: Goal: Risk for impaired skin integrity will decrease Outcome:  Progressing   Problem: OP Suicidal Ideation Goal: LTG: Placement in appropriate level of care to safely address suicidal or parasuicidal crisis, based on clinical assessment Outcome: Progressing Goal: LTG: Chontel will be free from thoughts of self-harm as evidenced by suicide assessment or self-reported score of ideations Outcome: Progressing Goal: LTG: Tauri will be free of unhealthy self-harm as evidenced by self-report and examination as appropriate Outcome: Progressing Goal: LTG: Dametria will utilize appropriate interventions from safety plan when distress or thoughts of self-harm occur Outcome: Progressing Goal: STG: Lasharn will recognize triggers to thoughts of self-harm 80% of the time they occur Outcome: Progressing Goal: STG: Krysten will use active coping strategies weekly, as evidenced by journal entries, self-report and/or clinician observation Outcome: Progressing Goal: STG: Haneefah will participate in at least one social activity per week Outcome: Progressing Goal: STG: Educate Caia on principles of suicide risk safety plan and levels of interventions Outcome: Progressing Goal: STG: Work with [name] to create and implement personal suicide risk safety plan Outcome: Progressing Goal: STG: Berlinda will participate in scheduled individual sessions as requested by clinician Outcome: Progressing

## 2022-08-07 ENCOUNTER — Inpatient Hospital Stay (HOSPITAL_COMMUNITY): Payer: 59

## 2022-08-07 DIAGNOSIS — F1994 Other psychoactive substance use, unspecified with psychoactive substance-induced mood disorder: Secondary | ICD-10-CM

## 2022-08-07 DIAGNOSIS — G9341 Metabolic encephalopathy: Secondary | ICD-10-CM

## 2022-08-07 DIAGNOSIS — J9601 Acute respiratory failure with hypoxia: Secondary | ICD-10-CM | POA: Diagnosis not present

## 2022-08-07 DIAGNOSIS — J189 Pneumonia, unspecified organism: Secondary | ICD-10-CM | POA: Diagnosis not present

## 2022-08-07 DIAGNOSIS — R45851 Suicidal ideations: Secondary | ICD-10-CM

## 2022-08-07 DIAGNOSIS — Z6841 Body Mass Index (BMI) 40.0 and over, adult: Secondary | ICD-10-CM

## 2022-08-07 LAB — COMPREHENSIVE METABOLIC PANEL WITH GFR
ALT: 13 U/L (ref 0–44)
AST: 11 U/L — ABNORMAL LOW (ref 15–41)
Albumin: 3.2 g/dL — ABNORMAL LOW (ref 3.5–5.0)
Alkaline Phosphatase: 55 U/L (ref 38–126)
Anion gap: 12 (ref 5–15)
BUN: 12 mg/dL (ref 6–20)
CO2: 23 mmol/L (ref 22–32)
Calcium: 8.4 mg/dL — ABNORMAL LOW (ref 8.9–10.3)
Chloride: 102 mmol/L (ref 98–111)
Creatinine, Ser: 0.52 mg/dL (ref 0.44–1.00)
GFR, Estimated: >60 mL/min (ref >60)
Glucose, Bld: 129 mg/dL — ABNORMAL HIGH (ref 70–99)
Potassium: 3.9 mmol/L (ref 3.5–5.1)
Sodium: 137 mmol/L (ref 135–145)
Total Bilirubin: 0.4 mg/dL (ref 0.3–1.2)
Total Protein: 7.6 g/dL (ref 6.5–8.1)

## 2022-08-07 LAB — GLUCOSE, CAPILLARY: Glucose-Capillary: 121 mg/dL — ABNORMAL HIGH (ref 70–99)

## 2022-08-07 LAB — PHOSPHORUS: Phosphorus: 3.2 mg/dL (ref 2.5–4.6)

## 2022-08-07 LAB — MAGNESIUM: Magnesium: 2.1 mg/dL (ref 1.7–2.4)

## 2022-08-07 MED ORDER — LEVALBUTEROL HCL 0.63 MG/3ML IN NEBU
0.6300 mg | INHALATION_SOLUTION | Freq: Three times a day (TID) | RESPIRATORY_TRACT | Status: DC
  Administered 2022-08-08 – 2022-08-11 (×10): 0.63 mg via RESPIRATORY_TRACT
  Filled 2022-08-07 (×10): qty 3

## 2022-08-07 MED ORDER — IPRATROPIUM BROMIDE 0.02 % IN SOLN
0.5000 mg | Freq: Three times a day (TID) | RESPIRATORY_TRACT | Status: DC
  Administered 2022-08-08 – 2022-08-11 (×10): 0.5 mg via RESPIRATORY_TRACT
  Filled 2022-08-07 (×10): qty 2.5

## 2022-08-07 MED ORDER — ARIPIPRAZOLE 10 MG PO TABS
10.0000 mg | ORAL_TABLET | Freq: Every day | ORAL | Status: DC
  Administered 2022-08-07 – 2022-08-09 (×3): 10 mg via ORAL
  Filled 2022-08-07 (×3): qty 1

## 2022-08-07 MED ORDER — GABAPENTIN 400 MG PO CAPS
800.0000 mg | ORAL_CAPSULE | Freq: Three times a day (TID) | ORAL | Status: DC
  Administered 2022-08-07 – 2022-08-16 (×27): 800 mg via ORAL
  Filled 2022-08-07 (×27): qty 2

## 2022-08-07 MED ORDER — SODIUM CHLORIDE 0.9 % IV SOLN: INTRAVENOUS | Status: DC | PRN

## 2022-08-07 NOTE — Consult Note (Signed)
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 Volume: Decreased  Handedness: Right   Mood and Affect  Mood: Dysphoric  Affect: Congruent   Thought Process  Thought  Processes: Coherent  Descriptions of Associations:Intact  Orientation:Full (Time, Place and Person)  Thought Content:Logical  History of Schizophrenia/Schizoaffective disorder:No  Duration of Psychotic Symptoms:N/A  Hallucinations:Hallucinations: None Ideas of Reference:None  Suicidal Thoughts:Suicidal Thoughts: Yes, Active SI Active Intent and/or Plan: With Intent; With Plan; Without Access to Means; With Means to Carry Out Homicidal Thoughts:Homicidal Thoughts: No  Sensorium  Memory: Immediate Fair; Recent Fair; Remote Fair  Judgment: Fair  Insight: Shallow   Executive Functions  Concentration: Fair  Attention Span: Fair  Recall: [employer] of Knowledge: Fair  Language: Fair   Psychomotor Activity  Psychomotor Activity:Psychomotor Activity: Normal  Assets  Assets: Communication Skills; Desire for Improvement; Social Support; Resilience; Financial Resources/Insurance   Sleep  Sleep:Sleep: Fair  Physical Exam: Physical Exam Vitals and nursing note reviewed.  Constitutional:      Appearance: Normal appearance. She is normal weight.  Cardiovascular:     Rate and Rhythm: Normal rate.  Skin:    General: Skin is warm.     Capillary Refill: Capillary refill takes less than 2 seconds.  Neurological:     General: No focal deficit present.     Mental Status: She is alert and oriented to person, place, and time. Mental status is at baseline.  Psychiatric:        Mood and Affect: Mood normal.        Behavior: Behavior normal.        Thought Content: Thought content normal.        Judgment: Judgment normal.    Review of Systems  Psychiatric/Behavioral:  Positive for depression and suicidal ideas. The patient is nervous/anxious.   All other systems reviewed and are negative.  Blood pressure 135/86, pulse 70, temperature 98.3 F (36.8 C), temperature source Oral, resp. rate 18, height '5\' 4"'$  (1.626 m), weight (!) 140.6 kg, last menstrual period  07/01/2021, SpO2 96 %. Body mass index is 53.21 kg/m.  Maintain 1:1 safety sitter. -Patient is willing to inpatient voluntarily, in the event she attempted to leave will need o be placed under IVC at that time.   -Resume home medications at this time.   -Labs reviewed UDS positive foc Cocaine, Amphetamines and THC. All other labs normal with the exception of CBC panel abnormally high.  EKG reviewed; QTc 431  Disposition: Recommend psychiatric Inpatient admission when medically cleared.  Suella Broad, FNP 08/07/2022 1:42 PM

## 2022-08-07 NOTE — Progress Notes (Signed)
PROGRESS NOTE    Sarah Cervantes  [id number] DOB: [birth year] DOA: 08/05/2022 PCP: Patient, No Pcp Per   Brief Narrative:  Sarah Cervantes is a 42 y.o. female with medical history significant for but not limited too polysubstance abuse including cocaine daily use, ethanol abuse, heroin abuse, history of alcohol abuse, COPD who supposed to be on 3 L supplemental oxygen chronically, previous suicide attempts, depression and anxiety as well as bipolar 1 disorder and other comorbidities who presents to the ED after becoming more short of breath and having a fall and having suicidal intent.  Of note history is taken from the chart and from the patient does she is extremely somnolent and falls asleep and becomes drowsy when speaking with her.  Reportedly the patient had a fall yesterday and has been more short of breath and has been having suicidal ideation.  After a fall she complained of left knee pain and neck pain status post fall.  Normally she is post ventilator and supplemental O2 via nasal cannula at baseline however has not used supplemental oxygen at least 3 days and this morning she took 4 Tylenol PMs and endorsed that "I just do not want to be here under more".  She presented for shortness of breath as well but she does endorse suicidal ideation with plans to overdose on pills.  She states that the shortness of breath got worse yesterday and she continues to be drowsy and falls asleep on my examination.  She recently was admitted to the inpatient psychiatric unit and presents with worsening shortness of breath.  She could not tell me if she is having chest pain or discomfort.  She been feeling poorly and "sick" for the last few days but did not elaborate on what she is feeling given her current condition.  Further evaluation and workup was done and she had a CT of the chest which showed "Small right pleural effusion which appears partially loculated in the major fissure and lateral aspect of the  right lower hemithorax. Multifocal ground-glass opacities throughout the bilateral lungs more prominent in the upper lobes. Findings are nonspecific and can be seen in the setting of multifocal pneumonia, pulmonary edema or hemorrhage. Mild  cardiomegaly. There are areas of atelectasis throughout the right lung with a small amount of airspace disease in the right middle lobe worrisome for infection."   Patient was placed on supplemental oxygen and continued be drowsy but did endorse suicidal ideation.  TRH was asked to admit this patient for acute on chronic hypoxic respiratory failure in the setting of multifocal pneumonia and partially loculated small pleural effusion as well as suicidal ideation with intent to overdose on pills.  Psychiatry was consulted for further evaluation given her suicidal attempt   **Interim History Patient was able to be weaned off of BiPAP for a little while and had to be placed on 6 L.  Continues to cough up quite a bit of productive sputum.  SLP has cleared the patient from their standpoint.  Psychiatry still recommends inpatient hospitalization.  Blood pressure remains elevated so she has been added back on her blood pressure medications and will avoid beta-blockers given her cocaine use.  Patient continued be somewhat anxious so her Atarax was also added back today.  Will need to continue monitor respiratory status and wean off of the BiPAP the 3 L of supplemental oxygen that she is supposed to be wearing.  Assessment and Plan:  Acute on chronic respiratory failure with hypoxia with a suspected  history of obesity hypoventilation syndrome -She was somnolent and drowsy and had increased respiratory rate and was breathing 30 times a minute but is improving and has been weaned to 6 L but then had to be placed back on BiPAP -CT scan shows multifocal pneumonia so placed her on empiric antibiotics with IV ceftriaxone and azithromycin for now -Continue Nebs with Xopenex and  Atrovent and add guaifenesin as well as a flutter valve and incentive spirometry -Given her somnolence and drowsiness will obtain an ABG and she may benefit from short-term BiPAP so this has been ordered -ABG:    Component Value Date/Time   PHART 7.41 08/05/2022 1828   PCO2ART 46 08/05/2022 1828   PO2ART 59 (L) 08/05/2022 1828   HCO3 29.2 (H) 08/05/2022 1828   O2SAT 92.4 08/05/2022 1828  -She will need a repeat chest x-ray in the a.m; x-ray done yesterday AM and showed "Unchanged appearance of the chest with RIGHT pleural effusion and scattered opacities/atelectasis within both mid and lower lungs." -Check blood cultures x 2 SpO2: (!) 89 % O2 Flow Rate (L/min): (S) 7 L/min (RN increased from 5-7 L due to desaturation.) FiO2 (%): 40 % -RSV, influenza A/B and SARS-CoV-2 as well as the 20 respiratory panel were all negative and strep pneumo urinary antigen negative -Continuous pulse oximetry and maintain O2 saturation greater 90% -Hold home inhalers with Symbicort and albuterol -Repeat CXR this AM done and showed "Stable to slightly increased RIGHT pleural effusion and RIGHT LOWER lung opacity/atelectasis. Cardiomegaly." -Continue supplemental oxygen via nasal cannula wean O2 as tolerated-she will need an amatory home O2 screen and repeat chest x-ray prior to discharge -Continue with BiPAP/CPAP when she sleeps and continue mobilization   Somnolence and drowsiness, improving  -Could be in the setting of above however she did endorse to me that she did take 4 Tylenol PMs and unclear etiology what else she took -Check UDS and Poison control was called and they recommended close monitoring with cardiac monitoring as well as monitoring for hypotension, seizures -Poison control recommended acetaminophen level as well as salicylate level and following up on the UDS and recommended calling back once acetaminophen level was obtained and obtaining LFTs; LFTs are normal and salicylate level and  acetaminophen level were negative  -Will give her 1 L bolus and placed on maintenance IV fluid hydration and move her to the stepdown unit for close observation and monitoring -Judicious use of Narcotics  -Continue with BiPAP and wean as tolerated   Hypokalemia -Patient's K+ Level Trend: Recent Labs  Lab 07/16/22 1320 07/22/22 0633 07/25/22 0555 08/05/22 1635 08/06/22 0327 08/07/22 1040  K 4.3 4.2 4.4 3.3* 3.8 3.9  -Continue to Monitor and Replete as Necessary -Repeat CMP in the AM    Suicidal ideation with history of bipolar 1 disorder -She was somnolent and drowsy but endorsed suicidal ideation so she will need one-to-one sitter; she has been involuntary committed and psychiatry has been consulted and recommending inpatient psychiatric hospitalization when she is medically stable -Monitor closely for SI, HI, AVH -Hold her home psychiatric medications including fluoxetine, gabapentin as well as Abilify and prazosin for now and defer to psych -We have resumed her Atarax given her anxiety and we will defer the rest of medications to psychiatry -Psychiatry still recommends inpatient hospitalization once medically stable and recommends maintaining 1:1 Safety sitter; Psych planning on resuming home Meds   Essential Hypertension -Resumed Home Lisinopril and Amlodipine due to elevated Pressures -Continue to Monitor BP per Protocol -Last BP  Reading was 129/89   GERD/GI prophylaxis -Continue with pantoprazole but changed to IV form   Thrombocytosis -Likely reactive in the setting of above Recent Labs  Lab 07/16/22 1320 07/22/22 0633 07/25/22 0555 08/05/22 1635 08/06/22 0327  PLT 392 328 331 465* 442*  -Continue monitor and trend and repeat CBC in a.m.   Super Morbid Obesity/OHS -Complicates overall prognosis and care -Estimated body mass index is 53.21 kg/m as calculated from the following:   Height as of this encounter: '5\' 4"'$  (1.626 m).   Weight as of this encounter: 140.6  kg.  -Weight Loss and Dietary Counseling given   Hypoalbuminemia -Patient's Albumin Trend: Recent Labs  Lab 07/16/22 1320 08/05/22 1635 08/05/22 2112 08/06/22 0327 08/07/22 1040  ALBUMIN 3.6 3.3* 3.2* 3.1* 3.2*  -Continue to Monitor and Trend and repeat CMP in the AM   Fall -She will need PT and OT to further evaluate and treat when she is much more awake and alert in order for the morning   Polysubstance abuse -Monitor for withdrawal symptoms given that she has a history of heroin abuse and daily cocaine use -Will not place her on a alcohol withdrawal protocol-given her extreme somnolence and drowsiness -Checked UDS and was positive for cocaine, amphetamines and THC  DVT prophylaxis: Place TED hose Start: 08/05/22 2104    Code Status: Full Code Family Communication: No family currently at bedside  Disposition Plan:  Level of care: Stepdown Status is: Inpatient Remains inpatient appropriate because: Remains on supplemental oxygen and O2 requirement slightly worsened given her somnolence and will need to continue to treat her pneumonia and ensure her respiratory status stable.  She will likely need to be on 3 L given her history of obesity hypoventilation syndrome and COPD   Consultants:  Psychiatry Discussed case with pulmonary Dr. Verlee Monte who evaluated for a paracentesis  Procedures:  As delineated as above  Antimicrobials:  Anti-infectives (From admission, onward)    Start     Dose/Rate Route Frequency Ordered Stop   08/06/22 1000  cefTRIAXone (ROCEPHIN) 2 g in sodium chloride 0.9 % 100 mL IVPB        2 g 200 mL/hr over 30 Minutes Intravenous Every 24 hours 08/05/22 2103 08/11/22 0959   08/05/22 2200  azithromycin (ZITHROMAX) 500 mg in sodium chloride 0.9 % 250 mL IVPB        500 mg 250 mL/hr over 60 Minutes Intravenous Every 24 hours 08/05/22 2103 08/10/22 2159   08/05/22 1630  cefTRIAXone (ROCEPHIN) 1 g in sodium chloride 0.9 % 100 mL IVPB        1 g 200 mL/hr  over 30 Minutes Intravenous  Once 08/05/22 1617 08/05/22 1852   08/05/22 1630  azithromycin (ZITHROMAX) tablet 500 mg        500 mg Oral  Once 08/05/22 1617 08/05/22 1650       Subjective: Seen and examined at bedside and she was little bit agitated and did not want to do anything until she ate breakfast.  Refused her labs and chest x-ray and states that she wanted her diet prior to agreeing to this.  No nausea or vomiting.  No other concerns or questions time but complains of neck pain and leg pain.   Objective: Vitals:   08/07/22 1209 08/07/22 1305 08/07/22 1412 08/07/22 1426  BP:  128/80  131/66  Pulse:  63    Resp:  (!) 26    Temp: 98.3 F (36.8 C)     TempSrc: Oral  SpO2:  92% (!) 88%   Weight:      Height:        Intake/Output Summary (Last 24 hours) at 08/07/2022 1507 Last data filed at 08/07/2022 1209 Gross per 24 hour  Intake 1330 ml  Output 2750 ml  Net -1420 ml   Filed Weights   08/05/22 1211  Weight: (!) 140.6 kg   Examination: Physical Exam:  Constitutional: WN/WD super morbidly obese Caucasian female in no acute distress and appears a little agitated and much more awake Respiratory: Diminished to auscultation bilaterally with coarse breath sounds, rhonchi and crackles.  No appreciable wheezing or rales. Normal respiratory effort and patient is not tachypenic. No accessory muscle use.  Wearing 5 L supplemental oxygen via nasal cannula Cardiovascular: RRR, no murmurs / rubs / gallops. S1 and S2 auscultated.  Mild extremity edema Abdomen: Soft, non-tender, distended second to body habitus. Bowel sounds positive.  GU: Deferred. Musculoskeletal: No clubbing / cyanosis of digits/nails. No joint deformity upper and lower extremities.  Skin: No rashes, lesions, ulcers on limited evaluation. No induration; Warm and dry.  Neurologic: CN 2-12 grossly intact with no focal deficits.  Romberg sign and cerebellar reflexes not assessed.  Psychiatric: Normal judgment and  insight. Alert and oriented x 3. Normal mood and appropriate affect.   Data Reviewed: I have personally reviewed following labs and imaging studies  CBC: Recent Labs  Lab 08/05/22 1635 08/06/22 0327  WBC 10.1 8.5  NEUTROABS 6.3 4.9  HGB 13.5 13.4  HCT 43.6 42.7  MCV 94.2 94.5  PLT 465* [id number]*   Basic Metabolic Panel: Recent Labs  Lab 08/05/22 1635 08/06/22 0327 08/07/22 1040  NA 139 138 137  K 3.3* 3.8 3.9  CL 106 109 102  CO2 '28 25 23  '$ GLUCOSE 95 97 129*  BUN '9 10 12  '$ CREATININE 0.67 0.58 0.52  CALCIUM 8.2* 8.3* 8.4*  MG  --  2.3 2.1  PHOS  --  4.1 3.2   GFR: Estimated Creatinine Clearance: 127.5 mL/min (by C-G formula based on SCr of 0.52 mg/dL). Liver Function Tests: Recent Labs  Lab 08/05/22 1635 08/05/22 2112 08/06/22 0327 08/07/22 1040  AST 11* 16 13* 11*  ALT '15 16 13 13  '$ ALKPHOS 52 54 56 55  BILITOT 0.7 0.5 0.6 0.4  PROT 7.3 7.3 7.3 7.6  ALBUMIN 3.3* 3.2* 3.1* 3.2*   No results for input(s): "LIPASE", "AMYLASE" in the last 168 hours. No results for input(s): "AMMONIA" in the last 168 hours. Coagulation Profile: No results for input(s): "INR", "PROTIME" in the last 168 hours. Cardiac Enzymes: No results for input(s): "CKTOTAL", "CKMB", "CKMBINDEX", "TROPONINI" in the last 168 hours. BNP (last 3 results) No results for input(s): "PROBNP" in the last 8760 hours. HbA1C: No results for input(s): "HGBA1C" in the last 72 hours. CBG: Recent Labs  Lab 08/06/22 0755 08/07/22 0750  GLUCAP 97 121*   Lipid Profile: No results for input(s): "CHOL", "HDL", "LDLCALC", "TRIG", "CHOLHDL", "LDLDIRECT" in the last 72 hours. Thyroid Function Tests: Recent Labs    08/05/22 2112  TSH 1.359   Anemia Panel: No results for input(s): "VITAMINB12", "FOLATE", "FERRITIN", "TIBC", "IRON", "RETICCTPCT" in the last 72 hours. Sepsis Labs: Recent Labs  Lab 08/05/22 1635  LATICACIDVEN 0.9    Recent Results (from the past 240 hour(s))  Culture, blood (routine x 2)      Status: None (Preliminary result)   Collection Time: 08/05/22  4:35 PM   Specimen: BLOOD RIGHT FOREARM  Result Value Ref  Range Status   Specimen Description   Final    BLOOD RIGHT FOREARM Performed at [facility], [place] [address], [place], [place] [address]    Special Requests   Final    BOTTLES DRAWN AEROBIC AND ANAEROBIC Blood Culture adequate volume Performed at [place] [address] [place], [place] [address]    Culture   Final    NO GROWTH < 12 HOURS Performed at [facility] [address], [place], [place] [address]    Report Status PENDING  Incomplete  Resp panel by RT-PCR (RSV, Flu A&B, Covid) Anterior Nasal Swab     Status: None   Collection Time: 08/05/22  6:51 PM   Specimen: Anterior Nasal Swab  Result Value Ref Range Status   SARS Coronavirus 2 by RT PCR NEGATIVE NEGATIVE Final    Comment: (NOTE) SARS-CoV-2 target nucleic acids are NOT DETECTED.  The SARS-CoV-2 RNA is generally detectable in upper respiratory specimens during the acute phase of infection. The lowest concentration of SARS-CoV-2 viral copies this assay can detect is 138 copies/mL. A negative result does not preclude SARS-Cov-2 infection and should not be used as the sole basis for treatment or other patient management decisions. A negative result may occur with  improper specimen collection/handling, submission of specimen other than nasopharyngeal swab, presence of viral mutation(s) within the areas targeted by this assay, and inadequate number of viral copies(<138 copies/mL). A negative result must be combined with clinical observations, patient history, and epidemiological information. The expected result is Negative.  Fact Sheet for Patients:  EntrepreneurPulse.com.au  Fact Sheet for Healthcare Providers:  IncredibleEmployment.be  This test is no t yet approved or cleared by the [place] FDA and  has been  authorized for detection and/or diagnosis of SARS-CoV-2 by FDA under an Emergency Use Authorization (EUA). This EUA will remain  in effect (meaning this test can be used) for the duration of the COVID-19 declaration under Section 564(b)(1) of the Act, 21 U.S.C.section 360bbb-3(b)(1), unless the authorization is terminated  or revoked sooner.       Influenza A by PCR NEGATIVE NEGATIVE Final   Influenza B by PCR NEGATIVE NEGATIVE Final    Comment: (NOTE) The Xpert Xpress SARS-CoV-2/FLU/RSV plus assay is intended as an aid in the diagnosis of influenza from Nasopharyngeal swab specimens and should not be used as a sole basis for treatment. Nasal washings and aspirates are unacceptable for Xpert Xpress SARS-CoV-2/FLU/RSV testing.  Fact Sheet for Patients: EntrepreneurPulse.com.au  Fact Sheet for Healthcare Providers: IncredibleEmployment.be  This test is not yet approved or cleared by the [place] FDA and has been authorized for detection and/or diagnosis of SARS-CoV-2 by FDA under an Emergency Use Authorization (EUA). This EUA will remain in effect (meaning this test can be used) for the duration of the COVID-19 declaration under Section 564(b)(1) of the Act, 21 U.S.C. section 360bbb-3(b)(1), unless the authorization is terminated or revoked.     Resp Syncytial Virus by PCR NEGATIVE NEGATIVE Final    Comment: (NOTE) Fact Sheet for Patients: EntrepreneurPulse.com.au  Fact Sheet for Healthcare Providers: IncredibleEmployment.be  This test is not yet approved or cleared by the [place] FDA and has been authorized for detection and/or diagnosis of SARS-CoV-2 by FDA under an Emergency Use Authorization (EUA). This EUA will remain in effect (meaning this test can be used) for the duration of the COVID-19 declaration under Section 564(b)(1) of the Act, 21 U.S.C. section 360bbb-3(b)(1), unless the  authorization is  terminated or revoked.  Performed at [facility], [place] [address], [place], [place] [address]   Respiratory (~20 pathogens) panel by PCR     Status: None   Collection Time: 08/05/22  6:51 PM   Specimen: Nasopharyngeal Swab; Respiratory  Result Value Ref Range Status   Adenovirus NOT DETECTED NOT DETECTED Final   Coronavirus 229E NOT DETECTED NOT DETECTED Final    Comment: (NOTE) The Coronavirus on the Respiratory Panel, DOES NOT test for the novel  Coronavirus (2019 nCoV)    Coronavirus HKU1 NOT DETECTED NOT DETECTED Final   Coronavirus NL63 NOT DETECTED NOT DETECTED Final   Coronavirus OC43 NOT DETECTED NOT DETECTED Final   Metapneumovirus NOT DETECTED NOT DETECTED Final   Rhinovirus / Enterovirus NOT DETECTED NOT DETECTED Final   Influenza A NOT DETECTED NOT DETECTED Final   Influenza B NOT DETECTED NOT DETECTED Final   Parainfluenza Virus 1 NOT DETECTED NOT DETECTED Final   Parainfluenza Virus 2 NOT DETECTED NOT DETECTED Final   Parainfluenza Virus 3 NOT DETECTED NOT DETECTED Final   Parainfluenza Virus 4 NOT DETECTED NOT DETECTED Final   Respiratory Syncytial Virus NOT DETECTED NOT DETECTED Final   Bordetella pertussis NOT DETECTED NOT DETECTED Final   Bordetella Parapertussis NOT DETECTED NOT DETECTED Final   Chlamydophila pneumoniae NOT DETECTED NOT DETECTED Final   Mycoplasma pneumoniae NOT DETECTED NOT DETECTED Final    Comment: Performed at [facility], [place]. [address], [place], [place] [address]  Culture, blood (routine x 2)     Status: None (Preliminary result)   Collection Time: 08/05/22  9:04 PM   Specimen: BLOOD RIGHT HAND  Result Value Ref Range Status   Specimen Description   Final    BLOOD RIGHT HAND Performed at [facility], [place] [address], [place], [place] [address]    Special Requests   Final    BOTTLES DRAWN AEROBIC ONLY Blood Culture adequate volume Performed at [facility]  [address], [place], [place] [address]    Culture   Final    NO GROWTH < 12 HOURS Performed at [facility] [address] [place], [place] [address]    Report Status PENDING  Incomplete  MRSA Next Gen by PCR, Nasal     Status: None   Collection Time: 08/05/22  9:45 PM   Specimen: Nasal Mucosa; Nasal Swab  Result Value Ref Range Status   MRSA by PCR Next Gen NOT DETECTED NOT DETECTED Final    Comment: (NOTE) The GeneXpert MRSA Assay (FDA approved for NASAL specimens only), is one component of a comprehensive MRSA colonization surveillance program. It is not intended to diagnose MRSA infection nor to guide or monitor treatment for MRSA infections. Test performance is not FDA approved in patients less than 68 years old. Performed at [facility], [place] [address] [place],  [address]     Radiology Studies: DG CHEST PORT 1 VIEW  Result Date: 08/07/2022 CLINICAL DATA:  Shortness of breath EXAM: PORTABLE CHEST 1 VIEW COMPARISON:  08/06/2022 and prior studies FINDINGS: Cardiomegaly again noted. Stable to slightly increased RIGHT pleural effusion and RIGHT LOWER lung opacity/atelectasis noted. No other changes are identified. There is no evidence of pneumothorax. IMPRESSION: 1. Stable to slightly increased RIGHT pleural effusion and RIGHT LOWER lung opacity/atelectasis. 2. Cardiomegaly. Electronically Signed   By: Margarette Canada M.D.   On: 08/07/2022 10:57   X-ray chest PA and lateral  Result Date: 08/06/2022  CLINICAL DATA:  Shortness of breath and weakness. EXAM: CHEST - 2 VIEW COMPARISON:  08/05/2022 CT, radiograph and prior studies FINDINGS: Cardiomegaly again noted. RIGHT pleural effusion and scattered opacities/atelectasis within both mid and lower lungs are again noted. There is no evidence of pneumothorax or new definite pulmonary opacity. No acute bony abnormalities are present. IMPRESSION: Unchanged appearance of the chest with RIGHT pleural effusion and  scattered opacities/atelectasis within both mid and lower lungs. Electronically Signed   By: Margarette Canada M.D.   On: 08/06/2022 10:15    Scheduled Meds:  amLODipine  5 mg Oral Daily   arformoterol  15 mcg Nebulization BID   ARIPiprazole  10 mg Oral Daily   budesonide (PULMICORT) nebulizer solution  0.25 mg Nebulization BID   Chlorhexidine Gluconate Cloth  6 each Topical Daily   enoxaparin (LOVENOX) injection  70 mg Subcutaneous Q24H   gabapentin  800 mg Oral TID   guaiFENesin  1,200 mg Oral BID   ipratropium  0.5 mg Nebulization Q6H   levalbuterol  0.63 mg Nebulization Q6H   lidocaine  1 patch Transdermal Daily   lisinopril  10 mg Oral Daily   mouth rinse  15 mL Mouth Rinse 4 times per day   pantoprazole (PROTONIX) IV  40 mg Intravenous Q24H   Continuous Infusions:  azithromycin Stopped (08/06/22 2208)   cefTRIAXone (ROCEPHIN)  IV Stopped (08/07/22 0846)    LOS: 2 days   Raiford Noble, DO [facility] Available via Epic secure chat 7am-7pm After these hours, please refer to coverage provider listed on [email or web address] 08/07/2022, 3:07 PM

## 2022-08-08 ENCOUNTER — Inpatient Hospital Stay (HOSPITAL_COMMUNITY): Payer: 59

## 2022-08-08 DIAGNOSIS — R45851 Suicidal ideations: Secondary | ICD-10-CM | POA: Diagnosis not present

## 2022-08-08 DIAGNOSIS — J9601 Acute respiratory failure with hypoxia: Secondary | ICD-10-CM | POA: Diagnosis not present

## 2022-08-08 DIAGNOSIS — F1994 Other psychoactive substance use, unspecified with psychoactive substance-induced mood disorder: Secondary | ICD-10-CM | POA: Diagnosis not present

## 2022-08-08 DIAGNOSIS — J189 Pneumonia, unspecified organism: Secondary | ICD-10-CM | POA: Diagnosis not present

## 2022-08-08 LAB — CBC WITH DIFFERENTIAL/PLATELET
Abs Immature Granulocytes: 0.04 10*3/uL (ref 0.00–0.07)
Basophils Absolute: 0 10*3/uL (ref 0.0–0.1)
Basophils Relative: 0 %
Eosinophils Absolute: 0.3 10*3/uL (ref 0.0–0.5)
Eosinophils Relative: 3 %
HCT: 44.3 % (ref 36.0–46.0)
Hemoglobin: 13.8 g/dL (ref 12.0–15.0)
Immature Granulocytes: 1 %
Lymphocytes Relative: 31 %
Lymphs Abs: 2.7 10*3/uL (ref 0.7–4.0)
MCH: 28.9 pg (ref 26.0–34.0)
MCHC: 31.2 g/dL (ref 30.0–36.0)
MCV: 92.7 fL (ref 80.0–100.0)
Monocytes Absolute: 0.6 10*3/uL (ref 0.1–1.0)
Monocytes Relative: 7 %
Neutro Abs: 5.1 10*3/uL (ref 1.7–7.7)
Neutrophils Relative %: 58 %
Platelets: 377 10*3/uL (ref 150–400)
RBC: 4.78 MIL/uL (ref 3.87–5.11)
RDW: 17 % — ABNORMAL HIGH (ref 11.5–15.5)
WBC: 8.8 10*3/uL (ref 4.0–10.5)
nRBC: 0 % (ref 0.0–0.2)

## 2022-08-08 LAB — COMPREHENSIVE METABOLIC PANEL
ALT: 14 U/L (ref 0–44)
AST: 12 U/L — ABNORMAL LOW (ref 15–41)
Albumin: 3.2 g/dL — ABNORMAL LOW (ref 3.5–5.0)
Alkaline Phosphatase: 51 U/L (ref 38–126)
Anion gap: 9 (ref 5–15)
BUN: 15 mg/dL (ref 6–20)
CO2: 25 mmol/L (ref 22–32)
Calcium: 8.6 mg/dL — ABNORMAL LOW (ref 8.9–10.3)
Chloride: 102 mmol/L (ref 98–111)
Creatinine, Ser: 0.7 mg/dL (ref 0.44–1.00)
GFR, Estimated: >60 mL/min (ref >60)
Glucose, Bld: 133 mg/dL — ABNORMAL HIGH (ref 70–99)
Potassium: 4 mmol/L (ref 3.5–5.1)
Sodium: 136 mmol/L (ref 135–145)
Total Bilirubin: 0.4 mg/dL (ref 0.3–1.2)
Total Protein: 7.4 g/dL (ref 6.5–8.1)

## 2022-08-08 LAB — GLUCOSE, CAPILLARY: Glucose-Capillary: 136 mg/dL — ABNORMAL HIGH (ref 70–99)

## 2022-08-08 LAB — LEGIONELLA PNEUMOPHILA SEROGP 1 UR AG: L. pneumophila Serogp 1 Ur Ag: NEGATIVE

## 2022-08-08 LAB — MAGNESIUM: Magnesium: 2.2 mg/dL (ref 1.7–2.4)

## 2022-08-08 LAB — PHOSPHORUS: Phosphorus: 5.1 mg/dL — ABNORMAL HIGH (ref 2.5–4.6)
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
## 2022-08-08 NOTE — Progress Notes (Signed)
OT Cancellation Note  Patient Details Name: Sarah Cervantes MRN: [id number] DOB: 01-11-1981   Cancelled Treatment:    Reason Eval/Treat Not Completed: Other (comment). Received chat text from RN that pt was agreeable for therapy to come see her at 12:30. Went to see patient at ~12:40 and all she would do was complain about waking her up and wanting to sleep because she was tired, "stating wouldn't you want to rest if you were in the hospital". She kept complaining so I gave her the right to refuse and she did.  [place] Office [phone]    Almon Register 08/08/2022, 1:06 PM

## 2022-08-08 NOTE — TOC Initial Note (Signed)
Transition of Care [facility]) - Initial/Assessment Note    Patient Details  Name: Sarah Cervantes MRN: [id number] Date of Birth: [birth year]  Transition of [facility]) CM/SW Contact:    Roseanne Kaufman, RN Phone Number: 08/08/2022, 6:52 PM  Clinical Narrative:    Per chart review patient from home/hotel, psych following for inpatient psych placement post medical stability.  Received TOC consult for SA resources and HH/DME. This RNCM will continue to follow awaiting PT eval, attached SA resources to AVS.   TOC following for needs.                  Barriers to Discharge: Continued Medical Work up   Patient Goals and CMS Choice            Expected Discharge Plan and Services In-house Referral: NA Discharge Planning Services: CM Consult   Living arrangements for the past 2 months: Hotel/Motel                                      Prior Living Arrangements/Services Living arrangements for the past 2 months: Hotel/Motel Lives with:: Self              Current home services: DME (home oxygen, wide walker ([employer]))    Activities of Daily Living Home Assistive Devices/Equipment: None ADL Screening (condition at time of admission) Patient's cognitive ability adequate to safely complete daily activities?: Yes Is the patient deaf or have difficulty hearing?: No Does the patient have difficulty seeing, even when wearing glasses/contacts?: No Does the patient have difficulty concentrating, remembering, or making decisions?: No Patient able to express need for assistance with ADLs?: No Does the patient have difficulty dressing or bathing?: No Independently performs ADLs?: Yes (appropriate for developmental age) Does the patient have difficulty walking or climbing stairs?: No Weakness of Legs: None Weakness of Arms/Hands: None  Permission Sought/Granted                  Emotional Assessment         Alcohol / Substance Use: Alcohol Use, Illicit Drugs (cocaine,  THC, amphetamines, heroin) Psych Involvement: Yes (comment)  Admission diagnosis:  Hypokalemia [E87.6] Multifocal pneumonia [J18.9] Pneumonia of both lungs due to infectious organism, unspecified part of lung [J18.9] Patient Active Problem List   Diagnosis Date Noted   Multifocal pneumonia 08/05/2022   COPD with acute exacerbation ([place]) 07/17/2022   Cocaine use disorder ([place]) 05/26/2022   Bipolar I disorder, most recent episode depressed ([place]) 05/26/2022   Suicidal ideation 05/26/2022   Homelessness 05/26/2022   Insomnia 04/03/2022   Anxiety state 04/03/2022   Affective psychosis, bipolar ([place]) 04/02/2022   Bipolar disorder, curr episode depressed, severe, w/psychotic features ([place]) 04/01/2022   Polysubstance abuse ([place]) 04/01/2022   MDD (major depressive disorder), recurrent episode, severe ([place]) 03/31/2022   Acute respiratory failure with hypoxia ([place]) 03/16/2022   Volume overload 03/16/2022   History of COVID-19 03/16/2022   Near syncope 03/14/2022   Closed fracture of T10 vertebra ([place]) 03/14/2022   Substance induced mood disorder ([place]) 07/16/2021   Constipation 04/13/2018   Bacterial vaginosis 04/13/2018   Bacteremia due to Gram-negative bacteria 03/25/2018   Cocaine abuse with cocaine-induced mood disorder ([place]) 03/24/2018   PTSD (post-traumatic stress disorder) 03/24/2018   Sepsis, Gram negative ([place]) 03/23/2018   Alcohol abuse with intoxication ([place]) 03/23/2018   Polysubstance dependence including opioid drug with daily use ([place]) 03/23/2018  PCP:  Patient, No Pcp Per Pharmacy:   [place] [address] Phone: [phone] Fax: [phone]  [place] [address], [place] [address] [place] [address] Phone: [phone] Fax: [phone]     Social Determinants of Health (SDOH) Social History: [place]: Food Insecurity  Present (08/06/2022)  Housing: Medium Risk (08/06/2022)  Transportation Needs: No Transportation Needs (08/06/2022)  Utilities: Not At Risk (08/06/2022)  Alcohol Screen: Medium Risk (04/10/2018)  Tobacco Use: High Risk (08/05/2022)   SDOH Interventions:     Readmission Risk Interventions     No data to display

## 2022-08-08 NOTE — Progress Notes (Signed)
PROGRESS NOTE    Sarah Cervantes  [id number] DOB: April 22, 1981 DOA: 08/05/2022 PCP: Patient, No Pcp Per   Brief Narrative:  Sarah Cervantes is a 42 y.o. female with medical history significant for but not limited too polysubstance abuse including cocaine daily use, ethanol abuse, heroin abuse, history of alcohol abuse, COPD who supposed to be on 3 L supplemental oxygen chronically, previous suicide attempts, depression and anxiety as well as bipolar 1 disorder and other comorbidities who presents to the ED after becoming more short of breath and having a fall and having suicidal intent.  Of note history is taken from the chart and from the patient does she is extremely somnolent and falls asleep and becomes drowsy when speaking with her.  Reportedly the patient had a fall yesterday and has been more short of breath and has been having suicidal ideation.  After a fall she complained of left knee pain and neck pain status post fall.  Normally she is post ventilator and supplemental O2 via nasal cannula at baseline however has not used supplemental oxygen at least 3 days and this morning she took 4 Tylenol PMs and endorsed that "I just do not want to be here under more".  She presented for shortness of breath as well but she does endorse suicidal ideation with plans to overdose on pills.  She states that the shortness of breath got worse yesterday and she continues to be drowsy and falls asleep on my examination.  She recently was admitted to the inpatient psychiatric unit and presents with worsening shortness of breath.  She could not tell me if she is having chest pain or discomfort.  She been feeling poorly and "sick" for the last few days but did not elaborate on what she is feeling given her current condition.  Further evaluation and workup was done and she had a CT of the chest which showed "Small right pleural effusion which appears partially loculated in the major fissure and lateral aspect of the  right lower hemithorax. Multifocal ground-glass opacities throughout the bilateral lungs more prominent in the upper lobes. Findings are nonspecific and can be seen in the setting of multifocal pneumonia, pulmonary edema or hemorrhage. Mild  cardiomegaly. There are areas of atelectasis throughout the right lung with a small amount of airspace disease in the right middle lobe worrisome for infection."   Patient was placed on supplemental oxygen and continued be drowsy but did endorse suicidal ideation.  TRH was asked to admit this patient for acute on chronic hypoxic respiratory failure in the setting of multifocal pneumonia and partially loculated small pleural effusion as well as suicidal ideation with intent to overdose on pills.  Psychiatry was consulted for further evaluation given her suicidal attempt   **Interim History Patient was able to be weaned off of BiPAP for a little while and had to be placed on 6 L.  Continues to cough up quite a bit of productive sputum.  SLP has cleared the patient from their standpoint.  Psychiatry still recommends inpatient hospitalization.  Blood pressure remains elevated so she has been added back on her blood pressure medications and will avoid beta-blockers given her cocaine use.  Patient continued be somewhat anxious so her Atarax was also added back today.  Will need to continue monitor respiratory status and wean off of the BiPAP the 3 L of supplemental oxygen that she is supposed to be wearing.   **She intermittently needs to go back on the BiPAP and was on it  again this morning.  PT OT has been consulted however she has been refusing the last few times and they are going to check back with her tomorrow.  Psychiatry recommends continuing current plan at this time and recommending to encourage the patient to participate in therapies with physical and Occupational Therapy given that decreasing her immobilization will lead to deconditioning.  Assessment and  Plan:  Acute on Chronic respiratory failure with hypoxia with a suspected history of obesity hypoventilation syndrome -She was somnolent and drowsy and had increased respiratory rate and was breathing 30 times a minute but is improving and has been weaned to 6 L but then had to be placed back on BiPAP -CT scan shows multifocal pneumonia so placed her on empiric antibiotics with IV ceftriaxone and azithromycin for now -Continue Nebs with Xopenex and Atrovent and add guaifenesin as well as a flutter valve and incentive spirometry -Given her somnolence and drowsiness will obtain an ABG and she may benefit from short-term BiPAP so this has been ordered ABG    Component Value Date/Time   PHART 7.41 08/05/2022 1828   PCO2ART 46 08/05/2022 1828   PO2ART 59 (L) 08/05/2022 1828   HCO3 29.2 (H) 08/05/2022 1828   O2SAT 92.4 08/05/2022 1828   -She will need a repeat chest x-ray in the a.m; SpO2: 93 % O2 Flow Rate (L/min): 7 L/min FiO2 (%): 50 % -RSV, influenza A/B and SARS-CoV-2 as well as the 20 respiratory panel were all negative and strep pneumo urinary antigen negative -Continuous pulse oximetry and maintain O2 saturation greater 90% -Hold home inhalers with Symbicort and albuterol -Repeat CXR this AM done and showed "Unchanged mild cardiomegaly. Unchanged small right pleural effusion and mild right basilar atelectasis. No pneumothorax. No acute osseous abnormality." -Continue supplemental oxygen via nasal cannula wean O2 as tolerated-she will need an amatory home O2 screen and repeat chest x-ray prior to discharge -Continue with BiPAP/CPAP when she sleeps and continue mobilization -PT OT consulted for mobilization and she is refused today   Somnolence and drowsiness, intermittent -Could be in the setting of above however she did endorse to me that she did take 4 Tylenol PMs and unclear etiology what else she took -Check UDS and Poison control was called and they recommended close monitoring  with cardiac monitoring as well as monitoring for hypotension, seizures -Poison control recommended acetaminophen level as well as salicylate level and following up on the UDS and recommended calling back once acetaminophen level was obtained and obtaining LFTs; LFTs are normal and salicylate level and acetaminophen level were negative  -Will give her 1 L bolus and placed on maintenance IV fluid hydration and move her to the stepdown unit for close observation and monitoring -Judicious use of Narcotics  -Continue with BiPAP and wean as tolerated and currently getting BiPAP   Hypokalemia -Patient's K+ Level Trend: Recent Labs  Lab 07/16/22 1320 07/22/22 0633 07/25/22 0555 08/05/22 1635 08/06/22 0327 08/07/22 1040 08/08/22 0255  K 4.3 4.2 4.4 3.3* 3.8 3.9 4.0  -Continue to Monitor and Replete as Necessary -Repeat CMP in the AM    Suicidal ideation with history of bipolar 1 disorder -She was somnolent and drowsy but endorsed suicidal ideation so she will need one-to-one sitter; she has been involuntary committed and psychiatry has been consulted and recommending inpatient psychiatric hospitalization when she is medically stable -Monitor closely for SI, HI, AVH -Hold her home psychiatric medications including fluoxetine, gabapentin as well as Abilify and prazosin for now and defer to  psych -We have resumed her Atarax given her anxiety and we will defer the rest of medications to psychiatry -Psychiatry still recommends inpatient hospitalization once medically stable and recommends maintaining 1:1 Safety sitter; Psych planning on resuming home Meds   Essential Hypertension -Resumed Home Lisinopril and Amlodipine due to elevated Pressures -Continue to Monitor BP per Protocol -Last BP Reading was 118/63   GERD/GI prophylaxis -Continue with pantoprazole but changed to IV form   Thrombocytosis -Likely reactive in the setting of above Recent Labs  Lab 07/16/22 1320 07/22/22 0633  07/25/22 0555 08/05/22 1635 08/06/22 0327 08/08/22 0255  PLT 392 328 331 465* 442* 377  -Continue monitor and trend and repeat CBC in a.m.   Super Morbid Obesity/OHS -Complicates overall prognosis and care -Estimated body mass index is 53.81 kg/m as calculated from the following:   Height as of this encounter: '5\' 4"'$  (1.626 m).   Weight as of this encounter: 142.2 kg.  -Weight Loss and Dietary Counseling given  Hypoalbuminemia -Patient's Albumin Trend: Recent Labs  Lab 07/16/22 1320 08/05/22 1635 08/05/22 2112 08/06/22 0327 08/07/22 1040 08/08/22 0255  ALBUMIN 3.6 3.3* 3.2* 3.1* 3.2* 3.2*  -Continue to Monitor and Trend and repeat CMP in the AM   Fall -She will need PT and OT to further evaluate and treat when she is much more awake and alert in order for the morning   Polysubstance abuse -Monitor for withdrawal symptoms given that she has a history of heroin abuse and daily cocaine use -Will not place her on a alcohol withdrawal protocol-given her extreme somnolence and drowsiness -Checked UDS and was positive for cocaine, amphetamines and THC  DVT prophylaxis: Place TED hose Start: 08/05/22 2104    Code Status: Full Code Family Communication: No family present at bedside   Disposition Plan:  Level of care: Stepdown Status is: Inpatient Remains inpatient appropriate because: Needs further clinical improvement and full of the BiPAP to nasal cannula   Consultants:  Psychiatry  Procedures:  As delineated as above  Antimicrobials:  Anti-infectives (From admission, onward)    Start     Dose/Rate Route Frequency Ordered Stop   08/06/22 1000  cefTRIAXone (ROCEPHIN) 2 g in sodium chloride 0.9 % 100 mL IVPB        2 g 200 mL/hr over 30 Minutes Intravenous Every 24 hours 08/05/22 2103 08/11/22 0959   08/05/22 2200  azithromycin (ZITHROMAX) 500 mg in sodium chloride 0.9 % 250 mL IVPB        500 mg 250 mL/hr over 60 Minutes Intravenous Every 24 hours 08/05/22 2103  08/10/22 2159   08/05/22 1630  cefTRIAXone (ROCEPHIN) 1 g in sodium chloride 0.9 % 100 mL IVPB        1 g 200 mL/hr over 30 Minutes Intravenous  Once 08/05/22 1617 08/05/22 1852   08/05/22 1630  azithromycin (ZITHROMAX) tablet 500 mg        500 mg Oral  Once 08/05/22 1617 08/05/22 1650       Subjective: Seen and examined at bedside and she was dressed on the BiPAP and wanting to sleep.  Did not really want to be bothered.  No chest pain or shortness of breath.  Denies any other concerns or complaints at this timeObjective: Vitals:   08/08/22 0400 08/08/22 0500 08/08/22 0745 08/08/22 0749  BP: 116/69 108/67    Pulse: 78 76    Resp: (!) 27 (!) 24    Temp:    (!) 97.5 F (36.4 C)  TempSrc:  Oral  SpO2: 91% 91% 91%   Weight:  (!) 142.2 kg    Height:        Intake/Output Summary (Last 24 hours) at 08/08/2022 0815 Last data filed at 08/08/2022 0750 Gross per 24 hour  Intake 2137.85 ml  Output 1550 ml  Net 587.85 ml   Filed Weights   08/05/22 1211 08/08/22 0500  Weight: (!) 140.6 kg (!) 142.2 kg   Examination: Physical Exam:  Constitutional: WN/WD super morbidly obese Caucasian female who is resting on the BiPAP and sleeping Respiratory: Diminished to auscultation bilaterally with coarse breath sounds and some slight rhonchi. no wheezing, rales, or crackles. Normal respiratory effort and patient is not tachypenic. No accessory muscle use.  Wearing BiPAP Cardiovascular: RRR, no murmurs / rubs / gallops. S1 and S2 auscultated. No extremity edema. Abdomen: Soft, non-tender, distended secondary to body habitus. Bowel sounds positive.  GU: Deferred. Musculoskeletal: No clubbing / cyanosis of digits/nails. No joint deformity upper and lower extremities.  Skin: No rashes, lesions, ulcers on limited skin evaluation. No induration; Warm and dry.  Neurologic: She is somnolent and drowsy and wanted to sleep Psychiatric: She is somnolent and drowsy and wanting to sleep and not  agitated  Data Reviewed: I have personally reviewed following labs and imaging studies  CBC: Recent Labs  Lab 08/05/22 1635 08/06/22 0327 08/08/22 0255  WBC 10.1 8.5 8.8  NEUTROABS 6.3 4.9 5.1  HGB 13.5 13.4 13.8  HCT 43.6 42.7 44.3  MCV 94.2 94.5 92.7  PLT 465* 442* [id number]   Basic Metabolic Panel: Recent Labs  Lab 08/05/22 1635 08/06/22 0327 08/07/22 1040 08/08/22 0255  NA 139 138 137 136  K 3.3* 3.8 3.9 4.0  CL 106 109 102 102  CO2 '28 25 23 25  '$ GLUCOSE 95 97 129* 133*  BUN '9 10 12 15  '$ CREATININE 0.67 0.58 0.52 0.70  CALCIUM 8.2* 8.3* 8.4* 8.6*  MG  --  2.3 2.1 2.2  PHOS  --  4.1 3.2 5.1*   GFR: Estimated Creatinine Clearance: 128.4 mL/min (by C-G formula based on SCr of 0.7 mg/dL). Liver Function Tests: Recent Labs  Lab 08/05/22 1635 08/05/22 2112 08/06/22 0327 08/07/22 1040 08/08/22 0255  AST 11* 16 13* 11* 12*  ALT '15 16 13 13 14  '$ ALKPHOS 52 54 56 55 51  BILITOT 0.7 0.5 0.6 0.4 0.4  PROT 7.3 7.3 7.3 7.6 7.4  ALBUMIN 3.3* 3.2* 3.1* 3.2* 3.2*   No results for input(s): "LIPASE", "AMYLASE" in the last 168 hours. No results for input(s): "AMMONIA" in the last 168 hours. Coagulation Profile: No results for input(s): "INR", "PROTIME" in the last 168 hours. Cardiac Enzymes: No results for input(s): "CKTOTAL", "CKMB", "CKMBINDEX", "TROPONINI" in the last 168 hours. BNP (last 3 results) No results for input(s): "PROBNP" in the last 8760 hours. HbA1C: No results for input(s): "HGBA1C" in the last 72 hours. CBG: Recent Labs  Lab 08/06/22 0755 08/07/22 0750 08/08/22 0744  GLUCAP 97 121* 136*   Lipid Profile: No results for input(s): "CHOL", "HDL", "LDLCALC", "TRIG", "CHOLHDL", "LDLDIRECT" in the last 72 hours. Thyroid Function Tests: Recent Labs    08/05/22 2112  TSH 1.359   Anemia Panel: No results for input(s): "VITAMINB12", "FOLATE", "FERRITIN", "TIBC", "IRON", "RETICCTPCT" in the last 72 hours. Sepsis Labs: Recent Labs  Lab 08/05/22 1635   LATICACIDVEN 0.9    Recent Results (from the past 240 hour(s))  Culture, blood (routine x 2)     Status: None (Preliminary result)  Collection Time: 08/05/22  4:35 PM   Specimen: BLOOD RIGHT FOREARM  Result Value Ref Range Status   Specimen Description   Final    BLOOD RIGHT FOREARM Performed at [facility], [address], [place], [place] [address]    Special Requests   Final    BOTTLES DRAWN AEROBIC AND ANAEROBIC Blood Culture adequate volume Performed at [place] [address], [place], [place] [address]    Culture   Final    NO GROWTH 1 DAY Performed at [facility], [place] [address], [place], [place] [address]    Report Status PENDING  Incomplete  Resp panel by RT-PCR (RSV, Flu A&B, Covid) Anterior Nasal Swab     Status: None   Collection Time: 08/05/22  6:51 PM   Specimen: Anterior Nasal Swab  Result Value Ref Range Status   SARS Coronavirus 2 by RT PCR NEGATIVE NEGATIVE Final    Comment: (NOTE) SARS-CoV-2 target nucleic acids are NOT DETECTED.  The SARS-CoV-2 RNA is generally detectable in upper respiratory specimens during the acute phase of infection. The lowest concentration of SARS-CoV-2 viral copies this assay can detect is 138 copies/mL. A negative result does not preclude SARS-Cov-2 infection and should not be used as the sole basis for treatment or other patient management decisions. A negative result may occur with  improper specimen collection/handling, submission of specimen other than nasopharyngeal swab, presence of viral mutation(s) within the areas targeted by this assay, and inadequate number of viral copies(<138 copies/mL). A negative result must be combined with clinical observations, patient history, and epidemiological information. The expected result is Negative.  Fact Sheet for Patients:  EntrepreneurPulse.com.au  Fact Sheet for Healthcare Providers:   IncredibleEmployment.be  This test is no t yet approved or cleared by the [place] FDA and  has been authorized for detection and/or diagnosis of SARS-CoV-2 by FDA under an Emergency Use Authorization (EUA). This EUA will remain  in effect (meaning this test can be used) for the duration of the COVID-19 declaration under Section 564(b)(1) of the Act, 21 U.S.C.section 360bbb-3(b)(1), unless the authorization is terminated  or revoked sooner.       Influenza A by PCR NEGATIVE NEGATIVE Final   Influenza B by PCR NEGATIVE NEGATIVE Final    Comment: (NOTE) The Xpert Xpress SARS-CoV-2/FLU/RSV plus assay is intended as an aid in the diagnosis of influenza from Nasopharyngeal swab specimens and should not be used as a sole basis for treatment. Nasal washings and aspirates are unacceptable for Xpert Xpress SARS-CoV-2/FLU/RSV testing.  Fact Sheet for Patients: EntrepreneurPulse.com.au  Fact Sheet for Healthcare Providers: IncredibleEmployment.be  This test is not yet approved or cleared by the [place] FDA and has been authorized for detection and/or diagnosis of SARS-CoV-2 by FDA under an Emergency Use Authorization (EUA). This EUA will remain in effect (meaning this test can be used) for the duration of the COVID-19 declaration under Section 564(b)(1) of the Act, 21 U.S.C. section 360bbb-3(b)(1), unless the authorization is terminated or revoked.     Resp Syncytial Virus by PCR NEGATIVE NEGATIVE Final    Comment: (NOTE) Fact Sheet for Patients: EntrepreneurPulse.com.au  Fact Sheet for Healthcare Providers: IncredibleEmployment.be  This test is not yet approved or cleared by the [place] FDA and has been authorized for detection and/or diagnosis of SARS-CoV-2 by FDA under an Emergency Use Authorization (EUA). This EUA will remain in effect (meaning this test can be used) for  the duration of the COVID-19  declaration under Section 564(b)(1) of the Act, 21 U.S.C. section 360bbb-3(b)(1), unless the authorization is terminated or revoked.  Performed at [facility], [place] [address] [place], [place] [address]   Respiratory (~20 pathogens) panel by PCR     Status: None   Collection Time: 08/05/22  6:51 PM   Specimen: Nasopharyngeal Swab; Respiratory  Result Value Ref Range Status   Adenovirus NOT DETECTED NOT DETECTED Final   Coronavirus 229E NOT DETECTED NOT DETECTED Final    Comment: (NOTE) The Coronavirus on the Respiratory Panel, DOES NOT test for the novel  Coronavirus (2019 nCoV)    Coronavirus HKU1 NOT DETECTED NOT DETECTED Final   Coronavirus NL63 NOT DETECTED NOT DETECTED Final   Coronavirus OC43 NOT DETECTED NOT DETECTED Final   Metapneumovirus NOT DETECTED NOT DETECTED Final   Rhinovirus / Enterovirus NOT DETECTED NOT DETECTED Final   Influenza A NOT DETECTED NOT DETECTED Final   Influenza B NOT DETECTED NOT DETECTED Final   Parainfluenza Virus 1 NOT DETECTED NOT DETECTED Final   Parainfluenza Virus 2 NOT DETECTED NOT DETECTED Final   Parainfluenza Virus 3 NOT DETECTED NOT DETECTED Final   Parainfluenza Virus 4 NOT DETECTED NOT DETECTED Final   Respiratory Syncytial Virus NOT DETECTED NOT DETECTED Final   Bordetella pertussis NOT DETECTED NOT DETECTED Final   Bordetella Parapertussis NOT DETECTED NOT DETECTED Final   Chlamydophila pneumoniae NOT DETECTED NOT DETECTED Final   Mycoplasma pneumoniae NOT DETECTED NOT DETECTED Final    Comment: Performed at [facility], [place] [address] [place], [place] [address]  Culture, blood (routine x 2)     Status: None (Preliminary result)   Collection Time: 08/05/22  9:04 PM   Specimen: BLOOD RIGHT HAND  Result Value Ref Range Status   Specimen Description   Final    BLOOD RIGHT HAND Performed at [facility], [place] [address], [place], [place] [address]    Special Requests    Final    BOTTLES DRAWN AEROBIC ONLY Blood Culture adequate volume Performed at [place] [address], [place], [place] [address]    Culture   Final    NO GROWTH 1 DAY Performed at [facility], [place] [address], [place], [place] [address]    Report Status PENDING  Incomplete  MRSA Next Gen by PCR, Nasal     Status: None   Collection Time: 08/05/22  9:45 PM   Specimen: Nasal Mucosa; Nasal Swab  Result Value Ref Range Status   MRSA by PCR Next Gen NOT DETECTED NOT DETECTED Final    Comment: (NOTE) The GeneXpert MRSA Assay (FDA approved for NASAL specimens only), is one component of a comprehensive MRSA colonization surveillance program. It is not intended to diagnose MRSA infection nor to guide or monitor treatment for MRSA infections. Test performance is not FDA approved in patients less than 41 years old. Performed at [facility], [facility] [address], [place], [place] [address]      Radiology Studies: DG CHEST PORT 1 VIEW  Result Date: 08/07/2022 CLINICAL DATA:  Shortness of breath EXAM: PORTABLE CHEST 1 VIEW COMPARISON:  08/06/2022 and prior studies FINDINGS: Cardiomegaly again noted. Stable to slightly increased RIGHT pleural effusion and RIGHT LOWER lung opacity/atelectasis noted. No other changes are identified. There is no evidence of pneumothorax. IMPRESSION: 1. Stable to slightly increased RIGHT pleural effusion and RIGHT LOWER lung opacity/atelectasis. 2. Cardiomegaly. Electronically Signed   By: Cleatis Polka.D.  On: 08/07/2022 10:57   X-ray chest PA and lateral  Result Date: 08/06/2022 CLINICAL DATA:  Shortness of breath and weakness. EXAM: CHEST - 2 VIEW COMPARISON:  08/05/2022 CT, radiograph and prior studies FINDINGS: Cardiomegaly again noted. RIGHT pleural effusion and scattered opacities/atelectasis within both mid and lower lungs are again noted. There is no evidence of pneumothorax or new definite pulmonary opacity. No  acute bony abnormalities are present. IMPRESSION: Unchanged appearance of the chest with RIGHT pleural effusion and scattered opacities/atelectasis within both mid and lower lungs. Electronically Signed   By: Margarette Canada M.D.   On: 08/06/2022 10:15    Scheduled Meds:  amLODipine  5 mg Oral Daily   arformoterol  15 mcg Nebulization BID   ARIPiprazole  10 mg Oral Daily   budesonide (PULMICORT) nebulizer solution  0.25 mg Nebulization BID   Chlorhexidine Gluconate Cloth  6 each Topical Daily   enoxaparin (LOVENOX) injection  70 mg Subcutaneous Q24H   gabapentin  800 mg Oral TID   guaiFENesin  1,200 mg Oral BID   ipratropium  0.5 mg Nebulization TID   levalbuterol  0.63 mg Nebulization TID   lidocaine  1 patch Transdermal Daily   lisinopril  10 mg Oral Daily   mouth rinse  15 mL Mouth Rinse 4 times per day   pantoprazole (PROTONIX) IV  40 mg Intravenous Q24H   Continuous Infusions:  sodium chloride 10 mL/hr at 08/07/22 2036   azithromycin Stopped (08/07/22 2330)   cefTRIAXone (ROCEPHIN)  IV Stopped (08/07/22 0846)    LOS: 3 days   Raiford Noble, DO [facility] Available via Epic secure chat 7am-7pm After these hours, please refer to coverage provider listed on [email or web address] 08/08/2022, 8:15 AM

## 2022-08-08 NOTE — Progress Notes (Signed)
OT Cancellation Note  Patient Details Name: Sarah Cervantes MRN: [id number] DOB: 1980/06/03   Cancelled Treatment:    Reason Eval/Treat Not Completed: Other (comment)Pt stating she was not going to get up now, but would tomorrow. With further asking her she agreed to sometime after lunch today.  [place] Office [phone]    Almon Register 08/08/2022, 10:37 AM

## 2022-08-08 NOTE — Progress Notes (Addendum)
PT Cancellation Note  Patient Details Name: Sarah Cervantes MRN: [id number] DOB: [birth year]   Cancelled Treatment:    Reason Eval/Treat Not Completed: Fatigue/lethargy limiting ability to participate (pt adamantly refused PT/OT, she said she's tired and needs to sleep.) Will attempt again tomorrow.   [name] PT 08/08/2022  [facility]  Office [phone]

## 2022-08-08 NOTE — Progress Notes (Signed)
PT Cancellation Note  Patient Details Name: Sarah Cervantes MRN: [id number] DOB: December 25, 1980   Cancelled Treatment:    Reason Eval/Treat Not Completed: Fatigue/lethargy limiting ability to participate (pt sleeping soundly, she refused PT/OT this morning, she agreed to attempt getting up this afternoon.)   Philomena Doheny PT 08/08/2022  [place]  Office [phone]

## 2022-08-08 NOTE — Progress Notes (Addendum)
Pt is complaining of nausea, cpap held at this time.  RN aware.  This Probation officer asked RN to advise when symptoms improve.  RT to assist with cpap later at that time.

## 2022-08-09 ENCOUNTER — Inpatient Hospital Stay (HOSPITAL_COMMUNITY): Payer: 59

## 2022-08-09 DIAGNOSIS — J189 Pneumonia, unspecified organism: Secondary | ICD-10-CM | POA: Diagnosis not present

## 2022-08-09 DIAGNOSIS — R45851 Suicidal ideations: Secondary | ICD-10-CM | POA: Diagnosis not present

## 2022-08-09 DIAGNOSIS — I10 Essential (primary) hypertension: Secondary | ICD-10-CM

## 2022-08-09 DIAGNOSIS — G9341 Metabolic encephalopathy: Secondary | ICD-10-CM

## 2022-08-09 DIAGNOSIS — Z6841 Body Mass Index (BMI) 40.0 and over, adult: Secondary | ICD-10-CM

## 2022-08-09 DIAGNOSIS — E876 Hypokalemia: Secondary | ICD-10-CM

## 2022-08-09 DIAGNOSIS — K219 Gastro-esophageal reflux disease without esophagitis: Secondary | ICD-10-CM

## 2022-08-09 DIAGNOSIS — F1994 Other psychoactive substance use, unspecified with psychoactive substance-induced mood disorder: Secondary | ICD-10-CM | POA: Diagnosis not present

## 2022-08-09 LAB — CBC WITH DIFFERENTIAL/PLATELET
Abs Immature Granulocytes: 0.07 10*3/uL (ref 0.00–0.07)
Basophils Absolute: 0 10*3/uL (ref 0.0–0.1)
Basophils Relative: 0 %
Eosinophils Absolute: 0.3 10*3/uL (ref 0.0–0.5)
Eosinophils Relative: 3 %
HCT: 46.3 % — ABNORMAL HIGH (ref 36.0–46.0)
Hemoglobin: 14.5 g/dL (ref 12.0–15.0)
Immature Granulocytes: 1 %
Lymphocytes Relative: 31 %
Lymphs Abs: 2.9 10*3/uL (ref 0.7–4.0)
MCH: 29.2 pg (ref 26.0–34.0)
MCHC: 31.3 g/dL (ref 30.0–36.0)
MCV: 93.2 fL (ref 80.0–100.0)
Monocytes Absolute: 0.9 10*3/uL (ref 0.1–1.0)
Monocytes Relative: 9 %
Neutro Abs: 5.3 10*3/uL (ref 1.7–7.7)
Neutrophils Relative %: 56 %
Platelets: 360 10*3/uL (ref 150–400)
RBC: 4.97 MIL/uL (ref 3.87–5.11)
RDW: 16.9 % — ABNORMAL HIGH (ref 11.5–15.5)
WBC: 9.5 10*3/uL (ref 4.0–10.5)
nRBC: 0 % (ref 0.0–0.2)

## 2022-08-09 LAB — GLUCOSE, CAPILLARY: Glucose-Capillary: 114 mg/dL — ABNORMAL HIGH (ref 70–99)

## 2022-08-09 LAB — COMPREHENSIVE METABOLIC PANEL
ALT: 13 U/L (ref 0–44)
AST: 15 U/L (ref 15–41)
Albumin: 3.2 g/dL — ABNORMAL LOW (ref 3.5–5.0)
Alkaline Phosphatase: 55 U/L (ref 38–126)
Anion gap: 9 (ref 5–15)
BUN: 15 mg/dL (ref 6–20)
CO2: 26 mmol/L (ref 22–32)
Calcium: 8.8 mg/dL — ABNORMAL LOW (ref 8.9–10.3)
Chloride: 101 mmol/L (ref 98–111)
Creatinine, Ser: 0.82 mg/dL (ref 0.44–1.00)
GFR, Estimated: >60 mL/min (ref >60)
Glucose, Bld: 112 mg/dL — ABNORMAL HIGH (ref 70–99)
Potassium: 4.6 mmol/L (ref 3.5–5.1)
Sodium: 136 mmol/L (ref 135–145)
Total Bilirubin: 0.6 mg/dL (ref 0.3–1.2)
Total Protein: 7.4 g/dL (ref 6.5–8.1)

## 2022-08-09 LAB — PHOSPHORUS: Phosphorus: 3.4 mg/dL (ref 2.5–4.6)

## 2022-08-09 LAB — BRAIN NATRIURETIC PEPTIDE: B Natriuretic Peptide: 6 pg/mL (ref 0.0–100.0)

## 2022-08-09 LAB — MAGNESIUM: Magnesium: 2.3 mg/dL (ref 1.7–2.4)

## 2022-08-09 MED ORDER — BUDESONIDE 0.5 MG/2ML IN SUSP
0.5000 mg | Freq: Two times a day (BID) | RESPIRATORY_TRACT | Status: DC
  Administered 2022-08-09 – 2022-08-16 (×14): 0.5 mg via RESPIRATORY_TRACT
  Filled 2022-08-09 (×14): qty 2

## 2022-08-09 MED ORDER — KETOROLAC TROMETHAMINE 30 MG/ML IJ SOLN
30.0000 mg | Freq: Four times a day (QID) | INTRAMUSCULAR | Status: AC | PRN
  Administered 2022-08-09 – 2022-08-13 (×14): 30 mg via INTRAVENOUS
  Filled 2022-08-09 (×15): qty 1

## 2022-08-09 MED ORDER — PANTOPRAZOLE SODIUM 40 MG PO TBEC
40.0000 mg | DELAYED_RELEASE_TABLET | Freq: Every day | ORAL | Status: DC
  Administered 2022-08-09 – 2022-08-16 (×8): 40 mg via ORAL
  Filled 2022-08-09 (×8): qty 1

## 2022-08-09 MED ORDER — METHOCARBAMOL 500 MG PO TABS
500.0000 mg | ORAL_TABLET | Freq: Three times a day (TID) | ORAL | Status: AC | PRN
  Administered 2022-08-09 – 2022-08-13 (×4): 500 mg via ORAL
  Filled 2022-08-09 (×4): qty 1

## 2022-08-09 MED ORDER — FUROSEMIDE 10 MG/ML IJ SOLN
20.0000 mg | Freq: Two times a day (BID) | INTRAMUSCULAR | Status: AC
  Administered 2022-08-09 (×2): 20 mg via INTRAVENOUS
  Filled 2022-08-09 (×2): qty 2

## 2022-08-09 MED ORDER — ONDANSETRON 4 MG PO TBDP: 4.0000 mg | ORAL_TABLET | Freq: Four times a day (QID) | ORAL | Status: AC | PRN

## 2022-08-09 MED ORDER — LOPERAMIDE HCL 2 MG PO CAPS: 2.0000 mg | ORAL_CAPSULE | ORAL | Status: AC | PRN

## 2022-08-09 MED ORDER — NAPROXEN 250 MG PO TABS
500.0000 mg | ORAL_TABLET | Freq: Two times a day (BID) | ORAL | Status: DC | PRN
  Administered 2022-08-09 – 2022-08-13 (×4): 500 mg via ORAL
  Filled 2022-08-09 (×6): qty 2

## 2022-08-09 MED ORDER — DICYCLOMINE HCL 20 MG PO TABS: 20.0000 mg | ORAL_TABLET | Freq: Four times a day (QID) | ORAL | Status: AC | PRN

## 2022-08-09 MED ORDER — ARIPIPRAZOLE 5 MG PO TABS
12.5000 mg | ORAL_TABLET | Freq: Every day | ORAL | Status: DC
  Administered 2022-08-10 – 2022-08-16 (×7): 12.5 mg via ORAL
  Filled 2022-08-09 (×7): qty 1

## 2022-08-09 NOTE — Evaluation (Signed)
Occupational Therapy Evaluation Patient Details Name: Sarah Cervantes MRN: [id number] DOB: 28-Jan-1981 Today's Date: 08/09/2022   History of Present Illness Sarah Cervantes is a 42 y.o. female presents to the ED after becoming more short of breath and having a fall and having suicidal intent. Found to have acute on chronic hypoxic respiratory failure in the setting of multifocal pneumonia and partially loculated small pleural effusion. PHMx: significant for but not limited too polysubstance abuse including cocaine daily use, ethanol abuse, heroin abuse, history of alcohol abuse, COPD who supposed to be on 3 L supplemental oxygen chronically, previous suicide attempts, depression and anxiety as well as bipolar 1 disorder. Was here 2 weeks ago s/p fall wtih questionable ligamentuous injury to knee (placed in [place]) and suicidal ideations with D/C to inpatient psych.   Clinical Impression   Ms. Sarah Cervantes is a 42 year old woman who presents with left knee pain and requiring increased oxygen needs. On evaluation she presents near her recent baseline - requiring a walker for ambulation, is a fall risk due to potential left knee buckling and some difficulty with ADLs due to body habitus. She has been educated previously on compensatory strategies for ADLs and provided with an ADL kit. She demonstrated ability to stand and mobilize with walker, she can stand for ADLs and transfer to [facility] or toilet if needed.  From an OT standpoint patient has no acute care needs. Needs to ambulate with nursing and be provided with opportunities to perform independent self care tasks. Plan is for her to discharge to inpatient psych.        Recommendations for follow up therapy are one component of a multi-disciplinary discharge planning process, led by the attending physician.  Recommendations may be updated based on patient status, additional functional criteria and insurance authorization.   Follow Up Recommendations   No OT follow up     Assistance Recommended at Discharge PRN  Patient can return home with the following Assistance with cooking/housework;Help with stairs or ramp for entrance    Functional Status Assessment  Patient has not had a recent decline in their functional status  Equipment Recommendations  None recommended by OT    Recommendations for Other Services       Precautions / Restrictions Precautions Precautions: Fall Restrictions Weight Bearing Restrictions: No Other Position/Activity Restrictions: WBAT      Mobility Bed Mobility               General bed mobility comments: in recliner    Transfers Overall transfer level: Needs assistance Equipment used: Rolling walker (2 wheels) Transfers: Sit to/from Stand Sit to Stand: Supervision           General transfer comment: supervision for sit stand. min guard to min assist to ambulate with walker      Balance Overall balance assessment: Mild deficits observed, not formally tested Sitting-balance support: No upper extremity supported Sitting balance-Leahy Scale: Good     Standing balance support: Reliant on assistive device for balance   Standing balance comment: due to left knee                           ADL either performed or assessed with clinical judgement   ADL Overall ADL's : Needs assistance/impaired Eating/Feeding: Independent   Grooming: Supervision/safety;Standing   Upper Body Bathing: Set up;Sitting   Lower Body Bathing: Minimal assistance;Sit to/from stand   Upper Body Dressing : Independent   Lower Body Dressing:  Minimal assistance;Sit to/from stand Lower Body Dressing Details (indicate cue type and reason): can be limited by left knee pain but has AE at home that she can use if needed. Toilet Transfer: Rolling walker (2 wheels);Min guard;BSC/3in1   Writer and Hygiene: Sit to/from stand;Minimal assistance Toileting - Clothing Manipulation  Details (indicate cue type and reason): patient has baseline difficulties with pericare due to soft tissue mass. She has been educated each visit on compensatory strategies. She accepts help while in hospital from staff but otherwise is able to clean as best she can.     Functional mobility during ADLs: Minimal assistance;Rolling walker (2 wheels)       Vision Patient Visual Report: No change from baseline       Perception     Praxis      Pertinent Vitals/Pain Pain Assessment Pain Assessment: Faces Faces Pain Scale: Hurts little more Pain Location: L knee Pain Descriptors / Indicators: Tightness Pain Intervention(s): Limited activity within patient's tolerance, Monitored during session     Hand Dominance Right   Extremity/Trunk Assessment Upper Extremity Assessment Upper Extremity Assessment: Overall WFL for tasks assessed   Lower Extremity Assessment Lower Extremity Assessment: Defer to PT evaluation RLE Deficits / Details: ROM WFL; MMT 5/5 LLE Deficits / Details: left knee buckled   Cervical / Trunk Assessment Cervical / Trunk Assessment: Other exceptions Cervical / Trunk Exceptions: obesity   Communication Communication Communication: No difficulties   Cognition Arousal/Alertness: Awake/alert Behavior During Therapy: WFL for tasks assessed/performed Overall Cognitive Status: Within Functional Limits for tasks assessed                                       General Comments       Exercises     Shoulder Instructions      Home Living Family/patient expects to be discharged to:: Shelter/Homeless Living Arrangements: Alone                               Additional Comments: Motel      Prior Functioning/Environment Prior Level of Function : Independent/Modified Independent             Mobility Comments: independent with a RW          OT Problem List:        OT Treatment/Interventions:      OT Goals(Current goals  can be found in the care plan section) Acute Rehab OT Goals OT Goal Formulation: All assessment and education complete, DC therapy  OT Frequency:      Co-evaluation PT/OT/SLP Co-Evaluation/Treatment: Yes (coeval) Reason for Co-Treatment: For patient/therapist safety PT goals addressed during session: Mobility/safety with mobility OT goals addressed during session: ADL's and self-care      AM-PAC OT "6 Clicks" Daily Activity     Outcome Measure Help from another person eating meals?: None Help from another person taking care of personal grooming?: A Little Help from another person toileting, which includes using toliet, bedpan, or urinal?: A Little Help from another person bathing (including washing, rinsing, drying)?: A Little Help from another person to put on and taking off regular upper body clothing?: A Little Help from another person to put on and taking off regular lower body clothing?: A Little 6 Click Score: 19   End of Session Equipment Utilized During Treatment: Rolling walker (2 wheels);Gait belt  Nurse Communication: Mobility status  Activity Tolerance: Patient tolerated treatment well Patient left: in chair;with call bell/phone within reach;with nursing/sitter in room  OT Visit Diagnosis: Pain Pain - Right/Left: Left Pain - part of body: Knee                Time: [id number] OT Time Calculation (min): 16 min Charges:  OT General Charges $OT Visit: 1 Visit OT Evaluation $OT Eval Low Complexity: 1 Low  Gustavo Lah, OTR/L [facility]  Office [phone]   Lenward Chancellor 08/09/2022, 4:57 PM
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
## 2022-08-09 NOTE — Progress Notes (Signed)
PROGRESS NOTE    Sarah Cervantes  [id number] DOB: December 25, 1980 DOA: 08/05/2022 PCP: Patient, No Pcp Per    Chief Complaint  Patient presents with   Shortness of Breath   Fall   Suicidal    Brief Narrative:  Sarah Cervantes is a 42 y.o. female with medical history significant for but not limited too polysubstance abuse including cocaine daily use, ethanol abuse, heroin abuse, history of alcohol abuse, COPD who supposed to be on 3 L supplemental oxygen chronically, previous suicide attempts, depression and anxiety as well as bipolar 1 disorder and other comorbidities who presents to the ED after becoming more short of breath and having a fall and having suicidal intent.  Of note history is taken from the chart and from the patient does she is extremely somnolent and falls asleep and becomes drowsy when speaking with her.  Reportedly the patient had a fall yesterday and has been more short of breath and has been having suicidal ideation.  After a fall she complained of left knee pain and neck pain status post fall.  Normally she is post ventilator and supplemental O2 via nasal cannula at baseline however has not used supplemental oxygen at least 3 days and this morning she took 4 Tylenol PMs and endorsed that "I just do not want to be here under more".  She presented for shortness of breath as well but she does endorse suicidal ideation with plans to overdose on pills.  She states that the shortness of breath got worse yesterday and she continues to be drowsy and falls asleep on my examination.  She recently was admitted to the inpatient psychiatric unit and presents with worsening shortness of breath.  She could not tell me if she is having chest pain or discomfort.  She been feeling poorly and "sick" for the last few days but did not elaborate on what she is feeling given her current condition.  Further evaluation and workup was done and she had a CT of the chest which showed "Small right pleural  effusion which appears partially loculated in the major fissure and lateral aspect of the right lower hemithorax. Multifocal ground-glass opacities throughout the bilateral lungs more prominent in the upper lobes. Findings are nonspecific and can be seen in the setting of multifocal pneumonia, pulmonary edema or hemorrhage. Mild  cardiomegaly. There are areas of atelectasis throughout the right lung with a small amount of airspace disease in the right middle lobe worrisome for infection."   Patient was placed on supplemental oxygen and continued be drowsy but did endorse suicidal ideation.  TRH was asked to admit this patient for acute on chronic hypoxic respiratory failure in the setting of multifocal pneumonia and partially loculated small pleural effusion as well as suicidal ideation with intent to overdose on pills.  Psychiatry was consulted for further evaluation given her suicidal attempt   **Interim History Patient was able to be weaned off of BiPAP for a little while and had to be placed on 6 L.  Continues to cough up quite a bit of productive sputum.  SLP has cleared the patient from their standpoint.  Psychiatry still recommends inpatient hospitalization.  Blood pressure remains elevated so she has been added back on her blood pressure medications and will avoid beta-blockers given her cocaine use.  Patient continued be somewhat anxious so her Atarax was also added back today.  Will need to continue monitor respiratory status and wean off of the BiPAP the 3 L of supplemental oxygen that  she is supposed to be wearing.   **She intermittently needs to go back on the BiPAP and was on it again the morning of 08/08/2022.  PT OT has been consulted patient initially refused however seen by PT OT today and no acute needs at this time.  Psychiatry recommends continuing current plan at this time and recommending to encourage the patient to participate in therapies with physical and Occupational Therapy given  that decreasing her immobilization will lead to deconditioning.   Assessment & Plan:   Principal Problem:   Multifocal pneumonia Active Problems:   Substance induced mood disorder (HCC)   Suicidal ideation   Hypokalemia   Acute metabolic encephalopathy   Essential hypertension   Gastroesophageal reflux disease   Morbid obesity with BMI of 50.0-59.9, adult (HCC)  #1 acute on chronic respiratory failure with hypoxia/history of obesity hypoventilation syndrome -Patient noted on admission and early on during the hospitalization to be somnolent and drowsy with increased respiratory rate initially breathing on 30 times a minute but somewhat improving. -Patient required intermittent BiPAP but currently on 6 L nasal cannula. -CT chest done with small right pleural effusion appears partially loculated in the major fissure lateral aspect of the right lower hemithorax, multifocal groundglass opacities throughout bilateral lungs more prominent in upper lobes findings are nonspecific can be seen in multifocal pneumonia, pulmonary edema or hemorrhage.  Mild cardiomegaly.  Areas of atelectasis throughout the right lung with a small amount of airspace disease in the right middle lobe worrisome for infection.  Follow-up imaging recommended 3 to 6 weeks to confirm resolution. -BNP noted at 49.6 on admission. -Respiratory viral panel negative. -MRSA PCR negative. -SARS coronavirus 2 PCR negative, influenza a and B PCR negative, RSV PCR negative. -ABG with a pH of 7.41, pO2 of 59, bicarb of 29, O2 sats of 92.4. -Patient with a urine output of 2 L recorded over the past 24 hours. -Patient still with increased O2 requirements on 6 L however some clinical improvement and as such we will place on Lasix 20 mg IV every 12 hours x 2 doses. -Urine pneumococcus antigen negative, urine Legionella antigen negative. -Continue empiric IV Rocephin and IV azithromycin and treat empirically for 5 to 7 days. -Pulmonary  toileting. -Continue Mucinex, scheduled nebs, Brovana, Pulmicort, PPI. -BiPAP as needed.  2.  Acute metabolic encephalopathy -Noted with some intermittent somnolence and drowsiness could likely be in the setting of problem #1. -Per prior physician patient noted to have taken 4 Tylenol PMs on killing etiology what else patient took. -UDS done was positive for cocaine, amphetamines, THC. -Salicylate level < 7. -Alcohol level < 10 -EKG with no ischemic changes.  No QT prolongation. -Patient with clinical improvement. -Continue empiric IV antibiotics.  3.  Hypokalemia -Repleted.  4.  Suicidal ideation/history of bipolar 1 disorder -Patient noted to be somnolent and drowsy early on during the hospitalization however endorsed suicidal ideation and requiring one-to-one sitter. -Patient initially noted to have been involuntarily committed, psychiatry consulted and recommended inpatient psychiatric hospitalization once medically stable. -Atarax resumed for anxiety. -Patient seen by psychiatry and patient willing to voluntary inpatient admission, resumption of home medications recommended by psychiatry and patient placed back on Abilify, gabapentin. -Per psychiatry.  5.  Hypertension -Home regimen lisinopril and amiodarone.  Resumed.  6.  GERD -PPI.  7.  Thrombocytosis -Likely reactive. -Improved.  8.  Super morbid obesity/OHS -BMI 53.89 -Lifestyle modification. -Outpatient follow-up with PCP.  25.  Fall -PT OT ordered, patient initially refused however patient assessed  by PT/OT today and no recommendations at this time.  10.  Polysubstance abuse -Patient noted with history of heroin abuse, cocaine use. -Alcohol level noted at < 10. -UDS done positive for cocaine, amphetamines, THC. -Placed on the clonidine detox protocol.   DVT prophylaxis: Lovenox Code Status: Full Family Communication: Updated patient.  No family at bedside. Disposition: Inpatient psychiatry when medically  stable and clinically improved.  Status is: Inpatient Remains inpatient appropriate because: Severity of illness   Consultants:  Psychiatry: Dr. Dwyane Dee 08/07/2022  Procedures:  CT chest 08/05/2022 Chest x-ray 08/06/2022, 08/07/2022, 08/08/2022, 08/09/2022  Antimicrobials: IV azithromycin 08/05/2022>>>> IV Rocephin 08/05/2022   Subjective: Patient laying in bed.  Some complaints of shortness of breath.  Feels improvement since admission.  Did not require BiPAP overnight.  States felt nauseous with episode of emesis and as such was not placed back on the BiPAP.  Currently on 5 L high flow nasal cannula.  Denies any significant chest pain.  No abdominal pain.  Complaining of back and neck pain after fall.  States pain medication not helping at all.  Objective: Vitals:   08/09/22 0600 08/09/22 0735 08/09/22 0750 08/09/22 1412  BP: (!) 146/102     Pulse: 81     Resp: (!) 42     Temp:  99.1 F (37.3 C)    TempSrc:  Oral    SpO2: 95%  95% 97%  Weight:      Height:        Intake/Output Summary (Last 24 hours) at 08/09/2022 1602 Last data filed at 08/09/2022 1200 Gross per 24 hour  Intake 2488.31 ml  Output 1500 ml  Net 988.31 ml   Filed Weights   08/05/22 1211 08/08/22 0500 08/09/22 0500  Weight: (!) 140.6 kg (!) 142.2 kg (!) 142.4 kg    Examination:  General exam: Appears calm and comfortable  Respiratory system: Decreased breath sounds in the bases.  Some diffuse crackles/coarse breath sounds.  No wheezing.  Fair air movement.  Speaking in full sentences.  On 5 L high flow nasal cannula.  Cardiovascular system: S1 & S2 heard, RRR. No JVD, murmurs, rubs, gallops or clicks. No pedal edema. Gastrointestinal system: Abdomen is nondistended, soft and nontender. No organomegaly or masses felt. Normal bowel sounds heard. Central nervous system: Alert and oriented. No focal neurological deficits. Extremities: Symmetric 5 x 5 power. Skin: No rashes, lesions or ulcers Psychiatry: Judgement  and insight appear fair. Mood & affect flat.     Data Reviewed: I have personally reviewed following labs and imaging studies  CBC: Recent Labs  Lab 08/05/22 1635 08/06/22 0327 08/08/22 0255 08/09/22 0314  WBC 10.1 8.5 8.8 9.5  NEUTROABS 6.3 4.9 5.1 5.3  HGB 13.5 13.4 13.8 14.5  HCT 43.6 42.7 44.3 46.3*  MCV 94.2 94.5 92.7 93.2  PLT 465* 442* 377 [id number]    Basic Metabolic Panel: Recent Labs  Lab 08/05/22 1635 08/06/22 0327 08/07/22 1040 08/08/22 0255 08/09/22 0314  NA 139 138 137 136 136  K 3.3* 3.8 3.9 4.0 4.6  CL 106 109 102 102 101  CO2 '28 25 23 25 26  '$ GLUCOSE 95 97 129* 133* 112*  BUN '9 10 12 15 15  '$ CREATININE 0.67 0.58 0.52 0.70 0.82  CALCIUM 8.2* 8.3* 8.4* 8.6* 8.8*  MG  --  2.3 2.1 2.2 2.3  PHOS  --  4.1 3.2 5.1* 3.4    GFR: Estimated Creatinine Clearance: 125.4 mL/min (by C-G formula based on SCr of 0.82 mg/dL).  Liver Function Tests: Recent Labs  Lab 08/05/22 2112 08/06/22 0327 08/07/22 1040 08/08/22 0255 08/09/22 0314  AST 16 13* 11* 12* 15  ALT '16 13 13 14 13  '$ ALKPHOS 54 56 55 51 55  BILITOT 0.5 0.6 0.4 0.4 0.6  PROT 7.3 7.3 7.6 7.4 7.4  ALBUMIN 3.2* 3.1* 3.2* 3.2* 3.2*    CBG: Recent Labs  Lab 08/06/22 0755 08/07/22 0750 08/08/22 0744 08/09/22 0732  GLUCAP 97 121* 136* 114*     Recent Results (from the past 240 hour(s))  Culture, blood (routine x 2)     Status: None (Preliminary result)   Collection Time: 08/05/22  4:35 PM   Specimen: BLOOD RIGHT FOREARM  Result Value Ref Range Status   Specimen Description   Final    BLOOD RIGHT FOREARM Performed at [facility] [address] [place], [place] [address]    Special Requests   Final    BOTTLES DRAWN AEROBIC AND ANAEROBIC Blood Culture adequate volume Performed at [facility] [address] [place], [place] [address]    Culture   Final    NO GROWTH 3 DAYS Performed at [facility], [place] [address] [place], [place] [address]    Report  Status PENDING  Incomplete  Resp panel by RT-PCR (RSV, Flu A&B, Covid) Anterior Nasal Swab     Status: None   Collection Time: 08/05/22  6:51 PM   Specimen: Anterior Nasal Swab  Result Value Ref Range Status   SARS Coronavirus 2 by RT PCR NEGATIVE NEGATIVE Final    Comment: (NOTE) SARS-CoV-2 target nucleic acids are NOT DETECTED.  The SARS-CoV-2 RNA is generally detectable in upper respiratory specimens during the acute phase of infection. The lowest concentration of SARS-CoV-2 viral copies this assay can detect is 138 copies/mL. A negative result does not preclude SARS-Cov-2 infection and should not be used as the sole basis for treatment or other patient management decisions. A negative result may occur with  improper specimen collection/handling, submission of specimen other than nasopharyngeal swab, presence of viral mutation(s) within the areas targeted by this assay, and inadequate number of viral copies(<138 copies/mL). A negative result must be combined with clinical observations, patient history, and epidemiological information. The expected result is Negative.  Fact Sheet for Patients:  EntrepreneurPulse.com.au  Fact Sheet for Healthcare Providers:  IncredibleEmployment.be  This test is no t yet approved or cleared by the [place] FDA and  has been authorized for detection and/or diagnosis of SARS-CoV-2 by FDA under an Emergency Use Authorization (EUA). This EUA will remain  in effect (meaning this test can be used) for the duration of the COVID-19 declaration under Section 564(b)(1) of the Act, 21 U.S.C.section 360bbb-3(b)(1), unless the authorization is terminated  or revoked sooner.       Influenza A by PCR NEGATIVE NEGATIVE Final   Influenza B by PCR NEGATIVE NEGATIVE Final    Comment: (NOTE) The Xpert Xpress SARS-CoV-2/FLU/RSV plus assay is intended as an aid in the diagnosis of influenza from Nasopharyngeal swab  specimens and should not be used as a sole basis for treatment. Nasal washings and aspirates are unacceptable for Xpert Xpress SARS-CoV-2/FLU/RSV testing.  Fact Sheet for Patients: EntrepreneurPulse.com.au  Fact Sheet for Healthcare Providers: IncredibleEmployment.be  This test is not yet approved or cleared by the [place] FDA and has been authorized for detection and/or diagnosis of SARS-CoV-2 by FDA under an Emergency Use Authorization (EUA). This EUA will remain in effect (meaning  this test can be used) for the duration of the COVID-19 declaration under Section 564(b)(1) of the Act, 21 U.S.C. section 360bbb-3(b)(1), unless the authorization is terminated or revoked.     Resp Syncytial Virus by PCR NEGATIVE NEGATIVE Final    Comment: (NOTE) Fact Sheet for Patients: EntrepreneurPulse.com.au  Fact Sheet for Healthcare Providers: IncredibleEmployment.be  This test is not yet approved or cleared by the [place] FDA and has been authorized for detection and/or diagnosis of SARS-CoV-2 by FDA under an Emergency Use Authorization (EUA). This EUA will remain in effect (meaning this test can be used) for the duration of the COVID-19 declaration under Section 564(b)(1) of the Act, 21 U.S.C. section 360bbb-3(b)(1), unless the authorization is terminated or revoked.  Performed at [facility], [place] [address] [place], [place] [address]   Respiratory (~20 pathogens) panel by PCR     Status: None   Collection Time: 08/05/22  6:51 PM   Specimen: Nasopharyngeal Swab; Respiratory  Result Value Ref Range Status   Adenovirus NOT DETECTED NOT DETECTED Final   Coronavirus 229E NOT DETECTED NOT DETECTED Final    Comment: (NOTE) The Coronavirus on the Respiratory Panel, DOES NOT test for the novel  Coronavirus (2019 nCoV)    Coronavirus HKU1 NOT DETECTED NOT DETECTED Final   Coronavirus  NL63 NOT DETECTED NOT DETECTED Final   Coronavirus OC43 NOT DETECTED NOT DETECTED Final   Metapneumovirus NOT DETECTED NOT DETECTED Final   Rhinovirus / Enterovirus NOT DETECTED NOT DETECTED Final   Influenza A NOT DETECTED NOT DETECTED Final   Influenza B NOT DETECTED NOT DETECTED Final   Parainfluenza Virus 1 NOT DETECTED NOT DETECTED Final   Parainfluenza Virus 2 NOT DETECTED NOT DETECTED Final   Parainfluenza Virus 3 NOT DETECTED NOT DETECTED Final   Parainfluenza Virus 4 NOT DETECTED NOT DETECTED Final   Respiratory Syncytial Virus NOT DETECTED NOT DETECTED Final   Bordetella pertussis NOT DETECTED NOT DETECTED Final   Bordetella Parapertussis NOT DETECTED NOT DETECTED Final   Chlamydophila pneumoniae NOT DETECTED NOT DETECTED Final   Mycoplasma pneumoniae NOT DETECTED NOT DETECTED Final    Comment: Performed at [facility], [place]. [address], [place], [place] [address]  Culture, blood (routine x 2)     Status: None (Preliminary result)   Collection Time: 08/05/22  9:04 PM   Specimen: BLOOD RIGHT HAND  Result Value Ref Range Status   Specimen Description   Final    BLOOD RIGHT HAND Performed at [facility], [place] [address] [place], [place] [address]    Special Requests   Final    BOTTLES DRAWN AEROBIC ONLY Blood Culture adequate volume Performed at [facility] [address] [place], [place] [address]    Culture   Final    NO GROWTH 3 DAYS Performed at [facility], [place] [address] [place], [place] [address]    Report Status PENDING  Incomplete  MRSA Next Gen by PCR, Nasal     Status: None   Collection Time: 08/05/22  9:45 PM   Specimen: Nasal Mucosa; Nasal Swab  Result Value Ref Range Status   MRSA by PCR Next Gen NOT DETECTED NOT DETECTED Final    Comment: (NOTE) The GeneXpert MRSA Assay (FDA approved for NASAL specimens only), is one component of a comprehensive MRSA colonization surveillance program. It is not intended to  diagnose MRSA infection nor to guide or monitor treatment for MRSA infections. Test performance is not  FDA approved in patients less than 20 years old. Performed at [facility], [place] [address] [place], [place] [address]          Radiology Studies: DG CHEST PORT 1 VIEW  Result Date: 08/09/2022 CLINICAL DATA:  42 year old female with shortness of breath. EXAM: PORTABLE CHEST - 1 VIEW COMPARISON:  08/07/2022, 08/08/2022 FINDINGS: The patient is rotated to the right. Unchanged cardiomegaly. Similar appearing low lung volumes. Similar appearing mild diffuse hazy pulmonary opacities. No acute osseous abnormality. IMPRESSION: Similar appearing low lung volumes and mild diffuse hazy pulmonary opacities. Electronically Signed   By: Ruthann Cancer M.D.   On: 08/09/2022 07:50   DG CHEST PORT 1 VIEW  Result Date: 08/08/2022 CLINICAL DATA:  Follow-up pneumonia. EXAM: PORTABLE CHEST 1 VIEW COMPARISON:  Chest x-ray from yesterday. FINDINGS: Unchanged mild cardiomegaly. Unchanged small right pleural effusion and mild right basilar atelectasis. No pneumothorax. No acute osseous abnormality. IMPRESSION: 1. Unchanged small right pleural effusion and mild right basilar atelectasis. Electronically Signed   By: Titus Dubin M.D.   On: 08/08/2022 08:54        Scheduled Meds:  amLODipine  5 mg Oral Daily   arformoterol  15 mcg Nebulization BID   ARIPiprazole  10 mg Oral Daily   budesonide (PULMICORT) nebulizer solution  0.5 mg Nebulization BID   Chlorhexidine Gluconate Cloth  6 each Topical Daily   enoxaparin (LOVENOX) injection  70 mg Subcutaneous Q24H   furosemide  20 mg Intravenous BID   gabapentin  800 mg Oral TID   guaiFENesin  1,200 mg Oral BID   ipratropium  0.5 mg Nebulization TID   levalbuterol  0.63 mg Nebulization TID   lidocaine  1 patch Transdermal Daily   lisinopril  10 mg Oral Daily   mouth rinse  15 mL Mouth Rinse 4 times per day   pantoprazole  40 mg Oral Daily    Continuous Infusions:  sodium chloride 10 mL/hr at 08/09/22 1200   azithromycin Stopped (08/08/22 2340)   cefTRIAXone (ROCEPHIN)  IV Stopped (08/09/22 1115)     LOS: 4 days    Time spent: 45 minutes    Irine Seal, MD [facility]   To contact the attending provider between 7A-7P or the covering provider during after hours 7P-7A, please log into the web site [email or web address] and access using universal [place] password for that web site. If you do not have the password, please call the hospital operator.  08/09/2022, 4:02 PM

## 2022-08-09 NOTE — Progress Notes (Signed)
Speech Language Pathology Treatment: Dysphagia  Patient Details Name: Sarah Cervantes MRN: [id number] DOB: July 20, 1980 Today's Date: 08/09/2022 Time: [id number] SLP Time Calculation (min) (ACUTE ONLY): 10 min  Assessment / Plan / Recommendation Clinical Impression  Pt seen today for skilled SLP follow up to assure po tolerance with diet advancement. Pt is on 5 liters HFNC - reports she is to be on 5 Liters at home but does not comply. She endorses GERD and reports she is on a PPI here which is managing it. Observed her consuming small bolus of solids, 3 ounce water challenge and all of her medications (*at one time) with timely swallow and no indication of dysphagia. Pt admits to occasional shortness of breath with intake - thus advised she cease po if dypenic due to impaired reciprocity of swallow/respirations. Demonstrated with dry swallow timing required to help demonstrate reasoning with teach back. No follow up needed - rec continue diet. Requested pt avoid Mint to help manage her reflux symptoms.  Thanks.   HPI HPI: Sarah Cervantes is a 42 y.o. female with medical history significant for but not limited too polysubstance abuse including cocaine daily use, ethanol abuse, heroin abuse, history of alcohol abuse, COPD, previous suicide attempts, depression and anxiety as well as bipolar 1 disorder and other comorbidities who presents to the ED after becoming more short of breath and having a fall and having suicidal intent.  Normally she is post ventilator and supplemental O2 via nasal cannula at baseline however has not used supplemental oxygen at least 3 days. Per chart recent admit to the inpatient psychiatric unit and presents with worsening shortness of breath.  CT of the chest which showed "Small right pleural effusion which appears partially loculated in the major fissure and lateral aspect of the right lower hemithorax. Multifocal ground-glass opacities throughout the bilateral lungs more  prominent in the upper lobes. Findings are nonspecific and can be seen in the setting of multifocal pneumonia, pulmonary edema or hemorrhage. Mild  cardiomegaly. There are areas of atelectasis throughout the right lung with a small amount of airspace disease in the right middle lobe worrisome for infection."      SLP Plan  All goals met      Recommendations for follow up therapy are one component of a multi-disciplinary discharge planning process, led by the attending physician.  Recommendations may be updated based on patient status, additional functional criteria and insurance authorization.    Recommendations  Medication Administration: Whole meds with liquid Compensations: Slow rate;Small sips/bites Postural Changes and/or Swallow Maneuvers: Seated upright 90 degrees;Upright 30-60 min after meal                Oral Care Recommendations: Oral care BID Follow Up Recommendations: No SLP follow up SLP Visit Diagnosis: Dysphagia, unspecified (R13.10) Plan: All goals met         Kathleen Lime, MS [facility] SLP Acute Rehab Services Office [phone]   Macario Golds  08/09/2022, 11:09 AM

## 2022-08-09 NOTE — Evaluation (Signed)
Physical Therapy Evaluation Patient Details Name: Sarah Cervantes MRN: [id number] DOB: 01/12/1981 Today's Date: 08/09/2022  History of Present Illness  Sarah Cervantes is a 42 y.o. female presents to the ED after becoming more short of breath and having a fall and having suicidal intent. Found to have acute on chronic hypoxic respiratory failure in the setting of multifocal pneumonia and partially loculated small pleural effusion. PHMx: significant for but not limited too polysubstance abuse including cocaine daily use, ethanol abuse, heroin abuse, history of alcohol abuse, COPD who supposed to be on 3 L supplemental oxygen chronically, previous suicide attempts, depression and anxiety as well as bipolar 1 disorder. Was here 2 weeks ago s/p fall wtih questionable ligamentuous injury to knee (placed in [place]) and suicidal ideations with D/C to inpatient psych.  Clinical Impression  Pt admitted with above diagnosis.  Pt currently with functional limitations due to the deficits listed below (see PT Problem List). Pt will benefit from skilled PT to increase their independence and safety with mobility to allow discharge to the venue listed below.     The patient is well known  from recent admissions.  Patient 's  distance limited this visit, L knee noted to buckle. Continue PT for improved safe ambulation.     Recommendations for follow up therapy are one component of a multi-disciplinary discharge planning process, led by the attending physician.  Recommendations may be updated based on patient status, additional functional criteria and insurance authorization.  Follow Up Recommendations No PT follow up      Assistance Recommended at Discharge PRN  Patient can return home with the following  Assistance with cooking/housework;Assist for transportation;Help with stairs or ramp for entrance;A little help with walking and/or transfers    Equipment Recommendations Rolling walker (2 wheels)   Recommendations for Other Services       Functional Status Assessment Patient has had a recent decline in their functional status and demonstrates the ability to make significant improvements in function in a reasonable and predictable amount of time.     Precautions / Restrictions Precautions Precautions: Fall      Mobility  Bed Mobility               General bed mobility comments: in recliner    Transfers Overall transfer level: Needs assistance Equipment used: Rolling walker (2 wheels) Transfers: Sit to/from Stand Sit to Stand: Supervision           General transfer comment: extra effort to rise  from recliner    Ambulation/Gait Ambulation/Gait assistance: Min guard, +2 safety/equipment Gait Distance (Feet): 20 Feet (then 10') Assistive device: Rolling walker (2 wheels) Gait Pattern/deviations: Step-to pattern, Decreased stride length, Decreased weight shift to left, Antalgic Gait velocity: decreased     General Gait Details: left knee buckled x 1  Stairs            Wheelchair Mobility    Modified Rankin (Stroke Patients Only)       Balance Overall balance assessment: Needs assistance, History of Falls Sitting-balance support: No upper extremity supported Sitting balance-Leahy Scale: Normal     Standing balance support: Single extremity supported Standing balance-Leahy Scale: Poor Standing balance comment: due to left knee                             Pertinent Vitals/Pain Pain Assessment Faces Pain Scale: Hurts little more Pain Location: L knee Pain Descriptors / Indicators: Tightness Pain Intervention(s): Monitored  during session, Limited activity within patient's tolerance    Home Living Family/patient expects to be discharged to:: Shelter/Homeless Living Arrangements: Alone                 Additional Comments: Motel    Prior Function Prior Level of Function : Independent/Modified Independent              Mobility Comments: independent with a RW       Hand Dominance        Extremity/Trunk Assessment        Lower Extremity Assessment RLE Deficits / Details: ROM WFL; MMT 5/5 LLE Deficits / Details: left knee buckled       Communication   Communication: No difficulties  Cognition Arousal/Alertness: Awake/alert Behavior During Therapy: WFL for tasks assessed/performed Overall Cognitive Status: Within Functional Limits for tasks assessed                                          General Comments      Exercises     Assessment/Plan    PT Assessment Patient needs continued PT services  PT Problem List Decreased strength;Pain;Decreased range of motion;Decreased activity tolerance;Decreased knowledge of use of DME;Decreased balance;Decreased safety awareness;Decreased mobility;Decreased knowledge of precautions       PT Treatment Interventions DME instruction;Therapeutic exercise;Gait training;Balance training;Wheelchair mobility training;Manual techniques;Stair training;Functional mobility training;Cognitive remediation;Therapeutic activities;Patient/family education;Modalities    PT Goals (Current goals can be found in the Care Plan section)  Acute Rehab PT Goals Patient Stated Goal: walk PT Goal Formulation: With patient Time For Goal Achievement: 08/23/22 Potential to Achieve Goals: Good    Frequency Min 3X/week     Co-evaluation PT/OT/SLP Co-Evaluation/Treatment: Yes Reason for Co-Treatment: For patient/therapist safety;Complexity of the patient's impairments (multi-system involvement);Other (comment) PT goals addressed during session: Mobility/safety with mobility OT goals addressed during session: ADL's and self-care       AM-PAC PT "6 Clicks" Mobility  Outcome Measure Help needed turning from your back to your side while in a flat bed without using bedrails?: A Little Help needed moving from lying on your back to sitting on the side of  a flat bed without using bedrails?: A Little Help needed moving to and from a bed to a chair (including a wheelchair)?: A Little Help needed standing up from a chair using your arms (e.g., wheelchair or bedside chair)?: A Little Help needed to walk in hospital room?: A Lot Help needed climbing 3-5 steps with a railing? : A Lot 6 Click Score: 16    End of Session Equipment Utilized During Treatment: Gait belt Activity Tolerance: Patient tolerated treatment well Patient left: in chair;with call bell/phone within reach;with nursing/sitter in room Nurse Communication: Mobility status PT Visit Diagnosis: Other abnormalities of gait and mobility (R26.89);Muscle weakness (generalized) (M62.81)    Time: [id number] PT Time Calculation (min) (ACUTE ONLY): 19 min   Charges:   PT Evaluation $PT Eval Low Complexity: [place] Office [phone] Weekend [id number]   Akir[place], [place] 08/09/2022, 3:17 PM

## 2022-08-09 NOTE — Progress Notes (Signed)
PHARMACIST - PHYSICIAN COMMUNICATION  DR:   [place] Silos  CONCERNING: IV to Oral Route Change Policy  RECOMMENDATION: This patient is receiving pantoprazole by the intravenous route.  Based on criteria approved by the Pharmacy and Therapeutics Committee, the intravenous medication(s) is/are being converted to the equivalent oral dose form(s).   DESCRIPTION: These criteria include: The patient is eating (either orally or via tube) and/or has been taking other orally administered medications for a least 24 hours The patient has no evidence of active gastrointestinal bleeding or impaired GI absorption (gastrectomy, short bowel, patient on TNA or NPO).  If you have questions about this conversion, please contact the Pharmacy Department  '[]'$   [phone] )  [facility] '[]'$   [phone] )  [facility] '[]'$   [phone] )  [facility] '[]'$   [phone] )  [facility] '[x]'$   [phone] )  [place], [facility] 08/09/2022 7:46 AM

## 2022-08-10 DIAGNOSIS — J189 Pneumonia, unspecified organism: Secondary | ICD-10-CM | POA: Diagnosis not present

## 2022-08-10 DIAGNOSIS — G9341 Metabolic encephalopathy: Secondary | ICD-10-CM | POA: Diagnosis not present

## 2022-08-10 DIAGNOSIS — I10 Essential (primary) hypertension: Secondary | ICD-10-CM | POA: Diagnosis not present

## 2022-08-10 DIAGNOSIS — K219 Gastro-esophageal reflux disease without esophagitis: Secondary | ICD-10-CM | POA: Diagnosis not present

## 2022-08-10 LAB — CBC WITH DIFFERENTIAL/PLATELET
Abs Immature Granulocytes: 0.05 10*3/uL (ref 0.00–0.07)
Basophils Absolute: 0 10*3/uL (ref 0.0–0.1)
Basophils Relative: 0 %
Eosinophils Absolute: 0.4 10*3/uL (ref 0.0–0.5)
Eosinophils Relative: 4 %
HCT: 44 % (ref 36.0–46.0)
Hemoglobin: 13.6 g/dL (ref 12.0–15.0)
Immature Granulocytes: 1 %
Lymphocytes Relative: 33 %
Lymphs Abs: 2.7 10*3/uL (ref 0.7–4.0)
MCH: 29.2 pg (ref 26.0–34.0)
MCHC: 30.9 g/dL (ref 30.0–36.0)
MCV: 94.4 fL (ref 80.0–100.0)
Monocytes Absolute: 0.8 10*3/uL (ref 0.1–1.0)
Monocytes Relative: 10 %
Neutro Abs: 4.2 10*3/uL (ref 1.7–7.7)
Neutrophils Relative %: 52 %
Platelets: 335 10*3/uL (ref 150–400)
RBC: 4.66 MIL/uL (ref 3.87–5.11)
RDW: 16.4 % — ABNORMAL HIGH (ref 11.5–15.5)
WBC: 8.1 10*3/uL (ref 4.0–10.5)
nRBC: 0 % (ref 0.0–0.2)

## 2022-08-10 LAB — URINALYSIS, ROUTINE W REFLEX MICROSCOPIC
Bilirubin Urine: NEGATIVE
Glucose, UA: NEGATIVE mg/dL
Hgb urine dipstick: NEGATIVE
Ketones, ur: NEGATIVE mg/dL
Nitrite: NEGATIVE
Protein, ur: NEGATIVE mg/dL
Specific Gravity, Urine: 1.011 (ref 1.005–1.030)
pH: 5 (ref 5.0–8.0)

## 2022-08-10 LAB — COMPREHENSIVE METABOLIC PANEL
ALT: 13 U/L (ref 0–44)
AST: 14 U/L — ABNORMAL LOW (ref 15–41)
Albumin: 3.2 g/dL — ABNORMAL LOW (ref 3.5–5.0)
Alkaline Phosphatase: 50 U/L (ref 38–126)
Anion gap: 9 (ref 5–15)
BUN: 24 mg/dL — ABNORMAL HIGH (ref 6–20)
CO2: 27 mmol/L (ref 22–32)
Calcium: 8.6 mg/dL — ABNORMAL LOW (ref 8.9–10.3)
Chloride: 99 mmol/L (ref 98–111)
Creatinine, Ser: 0.93 mg/dL (ref 0.44–1.00)
GFR, Estimated: >60 mL/min (ref >60)
Glucose, Bld: 106 mg/dL — ABNORMAL HIGH (ref 70–99)
Potassium: 4.5 mmol/L (ref 3.5–5.1)
Sodium: 135 mmol/L (ref 135–145)
Total Bilirubin: 0.7 mg/dL (ref 0.3–1.2)
Total Protein: 7.1 g/dL (ref 6.5–8.1)

## 2022-08-10 LAB — GLUCOSE, CAPILLARY: Glucose-Capillary: 124 mg/dL — ABNORMAL HIGH (ref 70–99)

## 2022-08-10 LAB — MAGNESIUM: Magnesium: 2.5 mg/dL — ABNORMAL HIGH (ref 1.7–2.4)

## 2022-08-10 MED ORDER — DICLOFENAC SODIUM 1 % EX GEL
2.0000 g | Freq: Four times a day (QID) | CUTANEOUS | Status: DC | PRN
  Administered 2022-08-12 – 2022-08-15 (×2): 2 g via TOPICAL
  Filled 2022-08-10 (×2): qty 100

## 2022-08-10 MED ORDER — CLOTRIMAZOLE 1 % VA CREA
1.0000 | TOPICAL_CREAM | Freq: Every day | VAGINAL | Status: DC
  Administered 2022-08-10 – 2022-08-12 (×2): 1 via VAGINAL
  Filled 2022-08-10: qty 45

## 2022-08-10 MED ORDER — FLUCONAZOLE 150 MG PO TABS
150.0000 mg | ORAL_TABLET | Freq: Once | ORAL | Status: AC
  Administered 2022-08-10: 150 mg via ORAL
  Filled 2022-08-10: qty 1

## 2022-08-10 NOTE — Progress Notes (Signed)
RT instructed patient on the use of a flutter valve and incentive spirometer. Patient is able to demonstrate back good technique with both devices. IS 1000 mL.

## 2022-08-10 NOTE — Progress Notes (Signed)
Reached out to [facility] NP, Raenette Rover. Pt. states she is getting a yeast infection (itching, burning in vagina) which is usual for her when taking ABX.

## 2022-08-10 NOTE — Progress Notes (Addendum)
PROGRESS NOTE    Sarah Cervantes  [id number] DOB: 12/09/1980 DOA: 08/05/2022 PCP: Patient, No Pcp Per    Chief Complaint  Patient presents with   Shortness of Breath   Fall   Suicidal    Brief Narrative:  Sarah Cervantes is a 42 y.o. female with medical history significant for but not limited too polysubstance abuse including cocaine daily use, ethanol abuse, heroin abuse, history of alcohol abuse, COPD who supposed to be on 3 L supplemental oxygen chronically, previous suicide attempts, depression and anxiety as well as bipolar 1 disorder and other comorbidities who presents to the ED after becoming more short of breath and having a fall and having suicidal intent.  Of note history is taken from the chart and from the patient does she is extremely somnolent and falls asleep and becomes drowsy when speaking with her.  Reportedly the patient had a fall yesterday and has been more short of breath and has been having suicidal ideation.  After a fall she complained of left knee pain and neck pain status post fall.  Normally she is post ventilator and supplemental O2 via nasal cannula at baseline however has not used supplemental oxygen at least 3 days and this morning she took 4 Tylenol PMs and endorsed that "I just do not want to be here under more".  She presented for shortness of breath as well but she does endorse suicidal ideation with plans to overdose on pills.  She states that the shortness of breath got worse yesterday and she continues to be drowsy and falls asleep on my examination.  She recently was admitted to the inpatient psychiatric unit and presents with worsening shortness of breath.  She could not tell me if she is having chest pain or discomfort.  She been feeling poorly and "sick" for the last few days but did not elaborate on what she is feeling given her current condition.  Further evaluation and workup was done and she had a CT of the chest which showed "Small right pleural  effusion which appears partially loculated in the major fissure and lateral aspect of the right lower hemithorax. Multifocal ground-glass opacities throughout the bilateral lungs more prominent in the upper lobes. Findings are nonspecific and can be seen in the setting of multifocal pneumonia, pulmonary edema or hemorrhage. Mild  cardiomegaly. There are areas of atelectasis throughout the right lung with a small amount of airspace disease in the right middle lobe worrisome for infection."   Patient was placed on supplemental oxygen and continued be drowsy but did endorse suicidal ideation.  TRH was asked to admit this patient for acute on chronic hypoxic respiratory failure in the setting of multifocal pneumonia and partially loculated small pleural effusion as well as suicidal ideation with intent to overdose on pills.  Psychiatry was consulted for further evaluation given her suicidal attempt   **Interim History Patient was able to be weaned off of BiPAP for a little while and had to be placed on 6 L.  Continues to cough up quite a bit of productive sputum.  SLP has cleared the patient from their standpoint.  Psychiatry still recommends inpatient hospitalization.  Blood pressure remains elevated so she has been added back on her blood pressure medications and will avoid beta-blockers given her cocaine use.  Patient continued be somewhat anxious so her Atarax was also added back today.  Will need to continue monitor respiratory status and wean off of the BiPAP the 3 L of supplemental oxygen that  she is supposed to be wearing.   **She intermittently needs to go back on the BiPAP and was on it again the morning of 08/08/2022.  PT OT has been consulted patient initially refused however seen by PT OT today and no acute needs at this time.  Psychiatry recommends continuing current plan at this time and recommending to encourage the patient to participate in therapies with physical and Occupational Therapy given  that decreasing her immobilization will lead to deconditioning.   Assessment & Plan:   Principal Problem:   Multifocal pneumonia Active Problems:   Substance induced mood disorder (HCC)   Suicidal ideation   Hypokalemia   Acute metabolic encephalopathy   Essential hypertension   Gastroesophageal reflux disease   Morbid obesity with BMI of 50.0-59.9, adult (HCC)  #1 acute on chronic respiratory failure with hypoxia/history of obesity hypoventilation syndrome -Patient noted on admission and early on during the hospitalization to be somnolent and drowsy with increased respiratory rate initially breathing on 30 times a minute but somewhat improving. -Patient required intermittent BiPAP but currently on 6 L high flow nasal cannula. -CT chest done with small right pleural effusion appears partially loculated in the major fissure lateral aspect of the right lower hemithorax, multifocal groundglass opacities throughout bilateral lungs more prominent in upper lobes findings are nonspecific can be seen in multifocal pneumonia, pulmonary edema or hemorrhage.  Mild cardiomegaly.  Areas of atelectasis throughout the right lung with a small amount of airspace disease in the right middle lobe worrisome for infection.  Follow-up imaging recommended 3 to 6 weeks to confirm resolution. -BNP noted at 49.6 on admission. -Respiratory viral panel negative. -MRSA PCR negative. -SARS coronavirus 2 PCR negative, influenza a and B PCR negative, RSV PCR negative. -ABG with a pH of 7.41, pO2 of 59, bicarb of 29, O2 sats of 92.4. -Patient with a urine output of 1.350 L recorded over the past 24 hours after receiving IV Lasix x 2 doses on 08/09/2022. -Urine pneumococcus antigen negative, urine Legionella antigen negative. -Continue empiric IV Rocephin and IV azithromycin and treat empirically for 5 to 7 days. -Pulmonary toileting. -Continue Mucinex, scheduled nebs, Brovana, Pulmicort, PPI. -BiPAP as needed.  2.   Acute metabolic encephalopathy -Noted with some intermittent somnolence and drowsiness could likely be in the setting of problem #1. -Per prior physician patient noted to have taken 4 Tylenol PMs on killing etiology what else patient took. -UDS done was positive for cocaine, amphetamines, THC. -Salicylate level < 7. -Alcohol level < 10 -EKG with no ischemic changes.  No QT prolongation. -Patient with clinical improvement and likely close to baseline.. -Continue empiric IV antibiotics.  3.  Hypokalemia -Repleted. -Potassium of 4.5.  4.  Suicidal ideation/history of bipolar 1 disorder -Patient noted to be somnolent and drowsy early on during the hospitalization however endorsed suicidal ideation and requiring one-to-one sitter. -Patient initially noted to have been involuntarily committed, psychiatry consulted and recommended inpatient psychiatric hospitalization once medically stable. -Atarax resumed for anxiety. -Patient seen by psychiatry and patient willing to voluntary inpatient admission, resumption of home medications recommended by psychiatry and patient placed back on Abilify, gabapentin. -Per psychiatry.  5.  Hypertension -Blood pressure stable on current regimen of lisinopril and amlodipine.  6.  GERD -PPI.  7.  Thrombocytosis -Likely reactive. -Improved.  8.  Super morbid obesity/OHS -BMI 53.89 -Lifestyle modification. -Outpatient follow-up with PCP.  45.  Fall -PT OT ordered, patient initially refused however patient assessed by PT/OT and no recommendations at this time.  10.  Polysubstance abuse -Patient noted with history of heroin abuse, cocaine use. -Alcohol level noted at < 10. -UDS done positive for cocaine, amphetamines, THC. -Placed on the clonidine detox protocol.  11.  OSA -CPAP nightly. -Will need outpatient follow-up with pulmonary for further evaluation of a OSA and to be set up for CPAP machine.   DVT prophylaxis: Lovenox Code Status:  Full Family Communication: Updated patient.  No family at bedside. Disposition: Inpatient psychiatry when medically stable and clinically improved.  Status is: Inpatient Remains inpatient appropriate because: Severity of illness   Consultants:  Psychiatry: Dr. Dwyane Dee 08/07/2022  Procedures:  CT chest 08/05/2022 Chest x-ray 08/06/2022, 08/07/2022, 08/08/2022, 08/09/2022  Antimicrobials: IV azithromycin 08/05/2022>>>> IV Rocephin 08/05/2022>>>>   Subjective: Patient laying in bed, on 6 L high flow nasal cannula with sats in the mid to high 90s.  Patient denies any chest pain.  Denies any significant shortness of breath.  States she is chronically on 3 to 4 L nasal cannula O2.  Asking for more food versus diet being changed to a regular diet.  Per RN patient was able to tolerate CPAP overnight..    Objective: Vitals:   08/10/22 0823 08/10/22 0825 08/10/22 0830 08/10/22 0900  BP:      Pulse:      Resp:      Temp:  97.7 F (36.5 C)  98 F (36.7 C)  TempSrc:  Oral  Oral  SpO2: 94%  99%   Weight:    (!) 143.5 kg  Height:        Intake/Output Summary (Last 24 hours) at 08/10/2022 1025 Last data filed at 08/10/2022 1000 Gross per 24 hour  Intake 1369.07 ml  Output 1800 ml  Net -430.93 ml    Filed Weights   08/09/22 0500 08/10/22 0701 08/10/22 0900  Weight: (!) 142.4 kg (!) 143.5 kg (!) 143.5 kg    Examination:  General exam: Appears calm and comfortable  Respiratory system: Some decreased breath sounds in the bases.  Some diffuse coarse/crackles breath sounds.  No wheezing.  Fair air movement.  Speaking in full sentences.  On 6 L high flow nasal cannula.  Cardiovascular system: Regular rate rhythm no murmurs rubs or gallops.  No JVD.  No lower extremity edema.  Gastrointestinal system: Abdomen is soft, nontender, nondistended, positive bowel sounds.  No rebound.  No guarding.  Central nervous system: Alert and oriented. No focal neurological deficits. Extremities: Symmetric 5 x 5  power. Skin: No rashes, lesions or ulcers Psychiatry: Judgement and insight appear fair. Mood & affect flat.     Data Reviewed: I have personally reviewed following labs and imaging studies  CBC: Recent Labs  Lab 08/05/22 1635 08/06/22 0327 08/08/22 0255 08/09/22 0314 08/10/22 0313  WBC 10.1 8.5 8.8 9.5 8.1  NEUTROABS 6.3 4.9 5.1 5.3 4.2  HGB 13.5 13.4 13.8 14.5 13.6  HCT 43.6 42.7 44.3 46.3* 44.0  MCV 94.2 94.5 92.7 93.2 94.4  PLT 465* 442* 377 360 335     Basic Metabolic Panel: Recent Labs  Lab 08/06/22 0327 08/07/22 1040 08/08/22 0255 08/09/22 0314 08/10/22 0313  NA 138 137 136 136 135  K 3.8 3.9 4.0 4.6 4.5  CL 109 102 102 101 99  CO2 '25 23 25 26 27  '$ GLUCOSE 97 129* 133* 112* 106*  BUN '10 12 15 15 '$ 24*  CREATININE 0.58 0.52 0.70 0.82 0.93  CALCIUM 8.3* 8.4* 8.6* 8.8* 8.6*  MG 2.3 2.1 2.2 2.3 2.5*  PHOS 4.1  3.2 5.1* 3.4  --      GFR: Estimated Creatinine Clearance: 111.1 mL/min (by C-G formula based on SCr of 0.93 mg/dL).  Liver Function Tests: Recent Labs  Lab 08/06/22 0327 08/07/22 1040 08/08/22 0255 08/09/22 0314 08/10/22 0313  AST 13* 11* 12* 15 14*  ALT '13 13 14 13 13  '$ ALKPHOS 56 55 51 55 50  BILITOT 0.6 0.4 0.4 0.6 0.7  PROT 7.3 7.6 7.4 7.4 7.1  ALBUMIN 3.1* 3.2* 3.2* 3.2* 3.2*     CBG: Recent Labs  Lab 08/06/22 0755 08/07/22 0750 08/08/22 0744 08/09/22 0732  GLUCAP 97 121* 136* 114*      Recent Results (from the past 240 hour(s))  Culture, blood (routine x 2)     Status: None (Preliminary result)   Collection Time: 08/05/22  4:35 PM   Specimen: BLOOD RIGHT FOREARM  Result Value Ref Range Status   Specimen Description   Final    BLOOD RIGHT FOREARM Performed at [facility] [address] [place], [place] [address]    Special Requests   Final    BOTTLES DRAWN AEROBIC AND ANAEROBIC Blood Culture adequate volume Performed at [facility] [address], [place], [place] [address]    Culture    Final    NO GROWTH 3 DAYS Performed at [facility], [place] [address], [place], [place] [address]    Report Status PENDING  Incomplete  Resp panel by RT-PCR (RSV, Flu A&B, Covid) Anterior Nasal Swab     Status: None   Collection Time: 08/05/22  6:51 PM   Specimen: Anterior Nasal Swab  Result Value Ref Range Status   SARS Coronavirus 2 by RT PCR NEGATIVE NEGATIVE Final    Comment: (NOTE) SARS-CoV-2 target nucleic acids are NOT DETECTED.  The SARS-CoV-2 RNA is generally detectable in upper respiratory specimens during the acute phase of infection. The lowest concentration of SARS-CoV-2 viral copies this assay can detect is 138 copies/mL. A negative result does not preclude SARS-Cov-2 infection and should not be used as the sole basis for treatment or other patient management decisions. A negative result may occur with  improper specimen collection/handling, submission of specimen other than nasopharyngeal swab, presence of viral mutation(s) within the areas targeted by this assay, and inadequate number of viral copies(<138 copies/mL). A negative result must be combined with clinical observations, patient history, and epidemiological information. The expected result is Negative.  Fact Sheet for Patients:  EntrepreneurPulse.com.au  Fact Sheet for Healthcare Providers:  IncredibleEmployment.be  This test is no t yet approved or cleared by the [place] FDA and  has been authorized for detection and/or diagnosis of SARS-CoV-2 by FDA under an Emergency Use Authorization (EUA). This EUA will remain  in effect (meaning this test can be used) for the duration of the COVID-19 declaration under Section 564(b)(1) of the Act, 21 U.S.C.section 360bbb-3(b)(1), unless the authorization is terminated  or revoked sooner.       Influenza A by PCR NEGATIVE NEGATIVE Final   Influenza B by PCR NEGATIVE NEGATIVE Final    Comment: (NOTE) The Xpert Xpress  SARS-CoV-2/FLU/RSV plus assay is intended as an aid in the diagnosis of influenza from Nasopharyngeal swab specimens and should not be used as a sole basis for treatment. Nasal washings and aspirates are unacceptable for Xpert Xpress SARS-CoV-2/FLU/RSV testing.  Fact Sheet for Patients: EntrepreneurPulse.com.au  Fact Sheet for Healthcare Providers: IncredibleEmployment.be  This test is not yet approved or cleared by the  [place] Architectural technologist and has been authorized for detection and/or diagnosis of SARS-CoV-2 by FDA under an Print production planner (EUA). This EUA will remain in effect (meaning this test can be used) for the duration of the COVID-19 declaration under Section 564(b)(1) of the Act, 21 U.S.C. section 360bbb-3(b)(1), unless the authorization is terminated or revoked.     Resp Syncytial Virus by PCR NEGATIVE NEGATIVE Final    Comment: (NOTE) Fact Sheet for Patients: EntrepreneurPulse.com.au  Fact Sheet for Healthcare Providers: IncredibleEmployment.be  This test is not yet approved or cleared by the [place] FDA and has been authorized for detection and/or diagnosis of SARS-CoV-2 by FDA under an Emergency Use Authorization (EUA). This EUA will remain in effect (meaning this test can be used) for the duration of the COVID-19 declaration under Section 564(b)(1) of the Act, 21 U.S.C. section 360bbb-3(b)(1), unless the authorization is terminated or revoked.  Performed at [facility], [place] [address], [place], [place] [address]   Respiratory (~20 pathogens) panel by PCR     Status: None   Collection Time: 08/05/22  6:51 PM   Specimen: Nasopharyngeal Swab; Respiratory  Result Value Ref Range Status   Adenovirus NOT DETECTED NOT DETECTED Final   Coronavirus 229E NOT DETECTED NOT DETECTED Final    Comment: (NOTE) The Coronavirus on the Respiratory Panel, DOES NOT test for  the novel  Coronavirus (2019 nCoV)    Coronavirus HKU1 NOT DETECTED NOT DETECTED Final   Coronavirus NL63 NOT DETECTED NOT DETECTED Final   Coronavirus OC43 NOT DETECTED NOT DETECTED Final   Metapneumovirus NOT DETECTED NOT DETECTED Final   Rhinovirus / Enterovirus NOT DETECTED NOT DETECTED Final   Influenza A NOT DETECTED NOT DETECTED Final   Influenza B NOT DETECTED NOT DETECTED Final   Parainfluenza Virus 1 NOT DETECTED NOT DETECTED Final   Parainfluenza Virus 2 NOT DETECTED NOT DETECTED Final   Parainfluenza Virus 3 NOT DETECTED NOT DETECTED Final   Parainfluenza Virus 4 NOT DETECTED NOT DETECTED Final   Respiratory Syncytial Virus NOT DETECTED NOT DETECTED Final   Bordetella pertussis NOT DETECTED NOT DETECTED Final   Bordetella Parapertussis NOT DETECTED NOT DETECTED Final   Chlamydophila pneumoniae NOT DETECTED NOT DETECTED Final   Mycoplasma pneumoniae NOT DETECTED NOT DETECTED Final    Comment: Performed at [facility], [place]. [address] [place], [place] [address]  Culture, blood (routine x 2)     Status: None (Preliminary result)   Collection Time: 08/05/22  9:04 PM   Specimen: BLOOD RIGHT HAND  Result Value Ref Range Status   Specimen Description   Final    BLOOD RIGHT HAND Performed at [facility], [place] [address] [place], [place] [address]    Special Requests   Final    BOTTLES DRAWN AEROBIC ONLY Blood Culture adequate volume Performed at [facility] [address] [place], [place] [address]    Culture   Final    NO GROWTH 3 DAYS Performed at [facility], [place] [address] [place], [place] [address]    Report Status PENDING  Incomplete  MRSA Next Gen by PCR, Nasal     Status: None   Collection Time: 08/05/22  9:45 PM   Specimen: Nasal Mucosa; Nasal Swab  Result Value Ref Range Status   MRSA by PCR Next Gen NOT DETECTED NOT DETECTED Final    Comment: (NOTE) The GeneXpert MRSA Assay (FDA approved for NASAL specimens  only), is one component  of a comprehensive MRSA colonization surveillance program. It is not intended to diagnose MRSA infection nor to guide or monitor treatment for MRSA infections. Test performance is not FDA approved in patients less than 70 years old. Performed at [facility], [place] [address], [place], [place] [address]          Radiology Studies: DG CHEST PORT 1 VIEW  Result Date: 08/09/2022 CLINICAL DATA:  42 year old female with shortness of breath. EXAM: PORTABLE CHEST - 1 VIEW COMPARISON:  08/07/2022, 08/08/2022 FINDINGS: The patient is rotated to the right. Unchanged cardiomegaly. Similar appearing low lung volumes. Similar appearing mild diffuse hazy pulmonary opacities. No acute osseous abnormality. IMPRESSION: Similar appearing low lung volumes and mild diffuse hazy pulmonary opacities. Electronically Signed   By: Ruthann Cancer M.D.   On: 08/09/2022 07:50        Scheduled Meds:  amLODipine  5 mg Oral Daily   arformoterol  15 mcg Nebulization BID   ARIPiprazole  12.5 mg Oral Daily   budesonide (PULMICORT) nebulizer solution  0.5 mg Nebulization BID   Chlorhexidine Gluconate Cloth  6 each Topical Daily   clotrimazole  1 Applicatorful Vaginal QHS   enoxaparin (LOVENOX) injection  70 mg Subcutaneous Q24H   gabapentin  800 mg Oral TID   guaiFENesin  1,200 mg Oral BID   ipratropium  0.5 mg Nebulization TID   levalbuterol  0.63 mg Nebulization TID   lidocaine  1 patch Transdermal Daily   lisinopril  10 mg Oral Daily   mouth rinse  15 mL Mouth Rinse 4 times per day   pantoprazole  40 mg Oral Daily   Continuous Infusions:  sodium chloride 10 mL/hr at 08/10/22 0530   cefTRIAXone (ROCEPHIN)  IV 2 g (08/10/22 1008)     LOS: 5 days    Time spent: 40 minutes    Irine Seal, MD [facility]   To contact the attending provider between 7A-7P or the covering provider during after hours 7P-7A, please log into the web site [email or web address]  and access using universal [place] password for that web site. If you do not have the password, please call the hospital operator.  08/10/2022, 10:25 AM
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
## 2022-08-11 LAB — CBC WITH DIFFERENTIAL/PLATELET
Abs Immature Granulocytes: 0.03 10*3/uL (ref 0.00–0.07)
Basophils Absolute: 0 10*3/uL (ref 0.0–0.1)
Basophils Relative: 0 %
Eosinophils Absolute: 0.4 10*3/uL (ref 0.0–0.5)
Eosinophils Relative: 5 %
HCT: 42.1 % (ref 36.0–46.0)
Hemoglobin: 13.2 g/dL (ref 12.0–15.0)
Immature Granulocytes: 0 %
Lymphocytes Relative: 30 %
Lymphs Abs: 2.4 10*3/uL (ref 0.7–4.0)
MCH: 29.4 pg (ref 26.0–34.0)
MCHC: 31.4 g/dL (ref 30.0–36.0)
MCV: 93.8 fL (ref 80.0–100.0)
Monocytes Absolute: 0.7 10*3/uL (ref 0.1–1.0)
Monocytes Relative: 9 %
Neutro Abs: 4.3 10*3/uL (ref 1.7–7.7)
Neutrophils Relative %: 56 %
Platelets: 308 10*3/uL (ref 150–400)
RBC: 4.49 MIL/uL (ref 3.87–5.11)
RDW: 16.2 % — ABNORMAL HIGH (ref 11.5–15.5)
WBC: 7.9 10*3/uL (ref 4.0–10.5)
nRBC: 0 % (ref 0.0–0.2)

## 2022-08-11 LAB — CULTURE, BLOOD (ROUTINE X 2)
Culture: NO GROWTH
Culture: NO GROWTH
Special Requests: ADEQUATE
Special Requests: ADEQUATE

## 2022-08-11 LAB — BASIC METABOLIC PANEL
Anion gap: 8 (ref 5–15)
BUN: 29 mg/dL — ABNORMAL HIGH (ref 6–20)
CO2: 26 mmol/L (ref 22–32)
Calcium: 8.7 mg/dL — ABNORMAL LOW (ref 8.9–10.3)
Chloride: 100 mmol/L (ref 98–111)
Creatinine, Ser: 0.72 mg/dL (ref 0.44–1.00)
GFR, Estimated: >60 mL/min (ref >60)
Glucose, Bld: 102 mg/dL — ABNORMAL HIGH (ref 70–99)
Potassium: 4.9 mmol/L (ref 3.5–5.1)
Sodium: 134 mmol/L — ABNORMAL LOW (ref 135–145)

## 2022-08-11 LAB — GLUCOSE, CAPILLARY: Glucose-Capillary: 110 mg/dL — ABNORMAL HIGH (ref 70–99)

## 2022-08-11 LAB — MAGNESIUM: Magnesium: 2.2 mg/dL (ref 1.7–2.4)

## 2022-08-11 MED ORDER — FLUOXETINE HCL 20 MG PO CAPS
40.0000 mg | ORAL_CAPSULE | Freq: Every day | ORAL | Status: DC
  Administered 2022-08-11 – 2022-08-16 (×6): 40 mg via ORAL
  Filled 2022-08-11 (×6): qty 2

## 2022-08-11 MED ORDER — LEVALBUTEROL HCL 0.63 MG/3ML IN NEBU
0.6300 mg | INHALATION_SOLUTION | Freq: Two times a day (BID) | RESPIRATORY_TRACT | Status: DC
  Administered 2022-08-11 – 2022-08-15 (×8): 0.63 mg via RESPIRATORY_TRACT
  Filled 2022-08-11 (×8): qty 3

## 2022-08-11 MED ORDER — IPRATROPIUM BROMIDE 0.02 % IN SOLN
0.5000 mg | Freq: Two times a day (BID) | RESPIRATORY_TRACT | Status: DC
  Administered 2022-08-11 – 2022-08-15 (×8): 0.5 mg via RESPIRATORY_TRACT
  Filled 2022-08-11 (×8): qty 2.5

## 2022-08-11 MED ORDER — TRAZODONE HCL 100 MG PO TABS
100.0000 mg | ORAL_TABLET | Freq: Every day | ORAL | Status: DC
  Administered 2022-08-12 – 2022-08-15 (×5): 100 mg via ORAL
  Filled 2022-08-11 (×3): qty 1
  Filled 2022-08-11: qty 2
  Filled 2022-08-11: qty 1

## 2022-08-11 NOTE — Progress Notes (Signed)
Physical Therapy Treatment Patient Details Name: Sarah Cervantes MRN: [id number] DOB: [birth year] Today's Date: 08/11/2022   History of Present Illness Sarah Cervantes is a 42 y.o. female presents to the ED after becoming more short of breath and having a fall and having suicidal intent. Found to have acute on chronic hypoxic respiratory failure in the setting of multifocal pneumonia and partially loculated small pleural effusion. PHMx: significant for but not limited too polysubstance abuse including cocaine daily use, ethanol abuse, heroin abuse, history of alcohol abuse, COPD who supposed to be on 3 L supplemental oxygen chronically, previous suicide attempts, depression and anxiety as well as bipolar 1 disorder. Was here 2 weeks ago s/p fall wtih questionable ligamentuous injury to knee (placed in [place]) and suicidal ideations with D/C to inpatient psych.    PT Comments    Patient   is resting in recliner on 2 L, SPO2 99%.  Patient requesting ambulation on RA :"I can't have oxygen where I want to go." Patient ambulated x 60' with min guard. Noted 3/4 dyspnea, saturation  pleth not picking up during ambulation and/or reading poorly at 74. After sitting down in recliner, Reading 955, then dropped to 91%. Replaced on 2 L .  Patient's gait is more steady today with RW.    Recommendations for follow up therapy are one component of a multi-disciplinary discharge planning process, led by the attending physician.  Recommendations may be updated based on patient status, additional functional criteria and insurance authorization.  Follow Up Recommendations  No PT follow up Can patient physically be transported by private vehicle: Yes   Assistance Recommended at Discharge PRN  Patient can return home with the following Assistance with cooking/housework;Assist for transportation;Help with stairs or ramp for entrance;A little help with walking and/or transfers   Equipment Recommendations        Recommendations for Other Services       Precautions / Restrictions Precautions Precautions: Fall Precaution Comments: left knee can buckle     Mobility  Bed Mobility               General bed mobility comments: in recliner    Transfers Overall transfer level: Needs assistance Equipment used: Rolling walker (2 wheels) Transfers: Sit to/from Stand, Bed to chair/wheelchair/BSC Sit to Stand: Supervision Stand pivot transfers: Supervision         General transfer comment: stand and pivot to [facility]    Ambulation/Gait Ambulation/Gait assistance: Min guard, +2 safety/equipment Gait Distance (Feet): 60 Feet Assistive device: Rolling walker (2 wheels) Gait Pattern/deviations: Step-to pattern, Step-through pattern Gait velocity: decreased     General Gait Details: improved gait with RW. amb on RA   Stairs             Wheelchair Mobility    Modified Rankin (Stroke Patients Only)       Balance   Sitting-balance support: No upper extremity supported Sitting balance-Leahy Scale: Good     Standing balance support: During functional activity, No upper extremity supported Standing balance-Leahy Scale: Fair                              Cognition Arousal/Alertness: Awake/alert Behavior During Therapy: WFL for tasks assessed/performed                                   General Comments: wants  to walk withoout O2 as  she cannot have it at [facility]        Exercises      General Comments        Pertinent Vitals/Pain Pain Assessment Faces Pain Scale: Hurts a little bit Pain Location: L knee Pain Descriptors / Indicators: Tightness Pain Intervention(s): Monitored during session    Home Living                          Prior Function            PT Goals (current goals can now be found in the care plan section) Progress towards PT goals: Progressing toward goals    Frequency    Min 3X/week      PT Plan  Current plan remains appropriate    Co-evaluation              AM-PAC PT "6 Clicks" Mobility   Outcome Measure  Help needed turning from your back to your side while in a flat bed without using bedrails?: A Little Help needed moving from lying on your back to sitting on the side of a flat bed without using bedrails?: A Little Help needed moving to and from a bed to a chair (including a wheelchair)?: A Little Help needed standing up from a chair using your arms (e.g., wheelchair or bedside chair)?: A Little Help needed to walk in hospital room?: A Little Help needed climbing 3-5 steps with a railing? : A Lot 6 Click Score: 17    End of Session Equipment Utilized During Treatment: Gait belt Activity Tolerance: Patient tolerated treatment well Patient left: in chair;with call bell/phone within reach;with nursing/sitter in room Nurse Communication: Mobility status PT Visit Diagnosis: Other abnormalities of gait and mobility (R26.89);Muscle weakness (generalized) (M62.81)     Time: [id number] PT Time Calculation (min) (ACUTE ONLY): 12 min  Charges:  $Gait Training: 8-22 mins                     [place] Office [phone] Weekend [id number]    Dorcie, [place] 08/11/2022, 12:54 PM

## 2022-08-11 NOTE — Progress Notes (Signed)
PT wore dream station CPAP apprx 5 hrs.

## 2022-08-11 NOTE — Progress Notes (Signed)
PROGRESS NOTE    Sarah Cervantes  [id number] DOB: December 25, 1980 DOA: 08/05/2022 PCP: Patient, No Pcp Per    Chief Complaint  Patient presents with   Shortness of Breath   Fall   Suicidal    Brief Narrative:  Sarah Cervantes is a 42 y.o. female with medical history significant for but not limited too polysubstance abuse including cocaine daily use, ethanol abuse, heroin abuse, history of alcohol abuse, COPD who supposed to be on 3 L supplemental oxygen chronically, previous suicide attempts, depression and anxiety as well as bipolar 1 disorder and other comorbidities who presents to the ED after becoming more short of breath and having a fall and having suicidal intent.  Of note history is taken from the chart and from the patient does she is extremely somnolent and falls asleep and becomes drowsy when speaking with her.  Reportedly the patient had a fall yesterday and has been more short of breath and has been having suicidal ideation.  After a fall she complained of left knee pain and neck pain status post fall.  Normally she is post ventilator and supplemental O2 via nasal cannula at baseline however has not used supplemental oxygen at least 3 days and this morning she took 4 Tylenol PMs and endorsed that "I just do not want to be here under more".  She presented for shortness of breath as well but she does endorse suicidal ideation with plans to overdose on pills.  She states that the shortness of breath got worse yesterday and she continues to be drowsy and falls asleep on my examination.  She recently was admitted to the inpatient psychiatric unit and presents with worsening shortness of breath.  She could not tell me if she is having chest pain or discomfort.  She been feeling poorly and "sick" for the last few days but did not elaborate on what she is feeling given her current condition.  Further evaluation and workup was done and she had a CT of the chest which showed "Small right pleural  effusion which appears partially loculated in the major fissure and lateral aspect of the right lower hemithorax. Multifocal ground-glass opacities throughout the bilateral lungs more prominent in the upper lobes. Findings are nonspecific and can be seen in the setting of multifocal pneumonia, pulmonary edema or hemorrhage. Mild  cardiomegaly. There are areas of atelectasis throughout the right lung with a small amount of airspace disease in the right middle lobe worrisome for infection."   Patient was placed on supplemental oxygen and continued be drowsy but did endorse suicidal ideation.  TRH was asked to admit this patient for acute on chronic hypoxic respiratory failure in the setting of multifocal pneumonia and partially loculated small pleural effusion as well as suicidal ideation with intent to overdose on pills.  Psychiatry was consulted for further evaluation given her suicidal attempt   **Interim History Patient was able to be weaned off of BiPAP for a little while and had to be placed on 6 L.  Continues to cough up quite a bit of productive sputum.  SLP has cleared the patient from their standpoint.  Psychiatry still recommends inpatient hospitalization.  Blood pressure remains elevated so she has been added back on her blood pressure medications and will avoid beta-blockers given her cocaine use.  Patient continued be somewhat anxious so her Atarax was also added back today.  Will need to continue monitor respiratory status and wean off of the BiPAP the 3 L of supplemental oxygen that  she is supposed to be wearing.   **She intermittently needs to go back on the BiPAP and was on it again the morning of 08/08/2022.  PT OT has been consulted patient initially refused however seen by PT OT today and no acute needs at this time.  Psychiatry recommends continuing current plan at this time and recommending to encourage the patient to participate in therapies with physical and Occupational Therapy given  that decreasing her immobilization will lead to deconditioning.   Assessment & Plan:   Principal Problem:   Multifocal pneumonia Active Problems:   Substance induced mood disorder (HCC)   Suicidal ideation   Hypokalemia   Acute metabolic encephalopathy   Essential hypertension   Gastroesophageal reflux disease   Morbid obesity with BMI of 50.0-59.9, adult (HCC)  #1 acute on chronic respiratory failure with hypoxia/history of obesity hypoventilation syndrome -Patient noted on admission and early on during the hospitalization to be somnolent and drowsy with increased respiratory rate initially breathing on 30 times a minute but somewhat improving. -Patient required intermittent BiPAP but currently on 6 L high flow nasal cannula. -CT chest done with small right pleural effusion appears partially loculated in the major fissure lateral aspect of the right lower hemithorax, multifocal groundglass opacities throughout bilateral lungs more prominent in upper lobes findings are nonspecific can be seen in multifocal pneumonia, pulmonary edema or hemorrhage.  Mild cardiomegaly.  Areas of atelectasis throughout the right lung with a small amount of airspace disease in the right middle lobe worrisome for infection.  Follow-up imaging recommended 3 to 6 weeks to confirm resolution. -BNP noted at 49.6 on admission. -Respiratory viral panel negative. -MRSA PCR negative. -SARS coronavirus 2 PCR negative, influenza a and B PCR negative, RSV PCR negative. -ABG with a pH of 7.41, pO2 of 59, bicarb of 29, O2 sats of 92.4. -Patient with a urine output of 1.350 L recorded over the past 24 hours after receiving IV Lasix x 2 doses on 08/09/2022. -Urine pneumococcus antigen negative, urine Legionella antigen negative. -Continue empiric IV Rocephin and IV azithromycin and treat empirically for 5 to 7 days. -Pulmonary toileting. -Continue Mucinex, scheduled nebs, Brovana, Pulmicort, PPI. -Currently continued on  4LNC, with CPAP at night. Had earlier required bipap  2.  Acute metabolic encephalopathy -Noted with some intermittent somnolence and drowsiness could likely be in the setting of problem #1. -Per prior physician patient noted to have taken 4 Tylenol PMs on killing etiology what else patient took. -UDS done was positive for cocaine, amphetamines, THC. -Salicylate level < 7. -Alcohol level < 10 -EKG with no ischemic changes.  No QT prolongation. -Patient with clinical improvement and likely close to baseline.. -Continue empiric IV antibiotics. -Seems to be conversing appropriately this AM  3.  Hypokalemia -Repleted. -Potassium of 4.9  4.  Suicidal ideation/history of bipolar 1 disorder -Patient noted to be somnolent and drowsy early on during the hospitalization however endorsed suicidal ideation and requiring one-to-one sitter. -Patient initially noted to have been involuntarily committed, psychiatry consulted and recommended inpatient psychiatric hospitalization once medically stable. -Atarax resumed for anxiety. -Patient seen by psychiatry and patient willing to voluntary inpatient admission, resumption of home medications recommended by psychiatry and patient placed back on Abilify, gabapentin. -Plan inpatient psych when medically clear. Continue to wean O2 as tolerated  5.  Hypertension -Blood pressure stable on current regimen of lisinopril and amlodipine.  6.  GERD -PPI.  7.  Thrombocytosis -Likely reactive. -Improved.  8.  Super morbid obesity/OHS -BMI 53.89 -Lifestyle  modification. -Outpatient follow-up with PCP.  83.  Fall -PT OT ordered, patient initially refused however patient assessed by PT/OT and no recommendations at this time.  10.  Polysubstance abuse -Patient noted with history of heroin abuse, cocaine use. -Alcohol level noted at < 10. -UDS done positive for cocaine, amphetamines, THC. -Placed on the clonidine detox protocol.  11.  OSA -CPAP  nightly. -Will need outpatient follow-up with pulmonary for further evaluation of a OSA and to be set up for CPAP machine.   DVT prophylaxis: Lovenox Code Status: Full Family Communication: Updated patient.  No family at bedside. Disposition: Inpatient psychiatry when medically stable and clinically improved.  Status is: Inpatient Remains inpatient appropriate because: Severity of illness   Consultants:  Psychiatry: Dr. Dwyane Dee 08/07/2022  Procedures:  CT chest 08/05/2022 Chest x-ray 08/06/2022, 08/07/2022, 08/08/2022, 08/09/2022  Antimicrobials: IV azithromycin 08/05/2022>>>> IV Rocephin 08/05/2022>>>>   Subjective: Seen this AM with pt asleep but arousable. Cont on 4LNC. Pt still with mild sob. Denies chest pain. Eager to go to inpatient psych when ultimately cleared    Objective: Vitals:   08/11/22 0500 08/11/22 0600 08/11/22 0745 08/11/22 0753  BP: 117/69 (!) 106/50    Pulse: 72 73    Resp:      Temp:    98.7 F (37.1 C)  TempSrc:    Oral  SpO2: 93% 98% 96% 96%  Weight: (!) 143.2 kg     Height:        Intake/Output Summary (Last 24 hours) at 08/11/2022 1353 Last data filed at 08/11/2022 1232 Gross per 24 hour  Intake 1289 ml  Output 500 ml  Net 789 ml    Filed Weights   08/10/22 0701 08/10/22 0900 08/11/22 0500  Weight: (!) 143.5 kg (!) 143.5 kg (!) 143.2 kg    Examination: General exam: Awake, laying in bed, in nad Respiratory system: Normal respiratory effort, no wheezing, [place] inplace Cardiovascular system: regular rate, s1, s2 Gastrointestinal system: Soft, nondistended, positive BS Central nervous system: CN2-12 grossly intact, strength intact Extremities: Perfused, no clubbing Skin: Normal skin turgor, no notable skin lesions seen Psychiatry: Mood normal // no visual hallucinations   Data Reviewed: I have personally reviewed following labs and imaging studies  CBC: Recent Labs  Lab 08/06/22 0327 08/08/22 0255 08/09/22 0314 08/10/22 0313 08/11/22 0330   WBC 8.5 8.8 9.5 8.1 7.9  NEUTROABS 4.9 5.1 5.3 4.2 4.3  HGB 13.4 13.8 14.5 13.6 13.2  HCT 42.7 44.3 46.3* 44.0 42.1  MCV 94.5 92.7 93.2 94.4 93.8  PLT 442* 377 360 335 308     Basic Metabolic Panel: Recent Labs  Lab 08/06/22 0327 08/07/22 1040 08/08/22 0255 08/09/22 0314 08/10/22 0313 08/11/22 0330  NA 138 137 136 136 135 134*  K 3.8 3.9 4.0 4.6 4.5 4.9  CL 109 102 102 101 99 100  CO2 25 23 25 26 27 26   GLUCOSE 97 129* 133* 112* 106* 102*  BUN 10 12 15 15  24* 29*  CREATININE 0.58 0.52 0.70 0.82 0.93 0.72  CALCIUM 8.3* 8.4* 8.6* 8.8* 8.6* 8.7*  MG 2.3 2.1 2.2 2.3 2.5* 2.2  PHOS 4.1 3.2 5.1* 3.4  --   --      GFR: Estimated Creatinine Clearance: 129 mL/min (by C-G formula based on SCr of 0.72 mg/dL).  Liver Function Tests: Recent Labs  Lab 08/06/22 0327 08/07/22 1040 08/08/22 0255 08/09/22 0314 08/10/22 0313  AST 13* 11* 12* 15 14*  ALT 13 13 14 13  13  ALKPHOS 56 55 51 55 50  BILITOT 0.6 0.4 0.4 0.6 0.7  PROT 7.3 7.6 7.4 7.4 7.1  ALBUMIN 3.1* 3.2* 3.2* 3.2* 3.2*     CBG: Recent Labs  Lab 08/07/22 0750 08/08/22 0744 08/09/22 0732 08/10/22 0819 08/11/22 0809  GLUCAP 121* 136* 114* 124* 110*      Recent Results (from the past 240 hour(s))  Culture, blood (routine x 2)     Status: None   Collection Time: 08/05/22  4:35 PM   Specimen: BLOOD RIGHT FOREARM  Result Value Ref Range Status   Specimen Description   Final    BLOOD RIGHT FOREARM Performed at [facility] [address] [place], [place] [address]    Special Requests   Final    BOTTLES DRAWN AEROBIC AND ANAEROBIC Blood Culture adequate volume Performed at [facility] [address], [place], [place] [address]    Culture   Final    NO GROWTH 5 DAYS Performed at [facility], [place] [address], [place], [place] [address]    Report Status 08/11/2022 FINAL  Final  Resp panel by RT-PCR (RSV, Flu A&B, Covid) Anterior Nasal Swab     Status: None   Collection  Time: 08/05/22  6:51 PM   Specimen: Anterior Nasal Swab  Result Value Ref Range Status   SARS Coronavirus 2 by RT PCR NEGATIVE NEGATIVE Final    Comment: (NOTE) SARS-CoV-2 target nucleic acids are NOT DETECTED.  The SARS-CoV-2 RNA is generally detectable in upper respiratory specimens during the acute phase of infection. The lowest concentration of SARS-CoV-2 viral copies this assay can detect is 138 copies/mL. A negative result does not preclude SARS-Cov-2 infection and should not be used as the sole basis for treatment or other patient management decisions. A negative result may occur with  improper specimen collection/handling, submission of specimen other than nasopharyngeal swab, presence of viral mutation(s) within the areas targeted by this assay, and inadequate number of viral copies(<138 copies/mL). A negative result must be combined with clinical observations, patient history, and epidemiological information. The expected result is Negative.  Fact Sheet for Patients:  EntrepreneurPulse.com.au  Fact Sheet for Healthcare Providers:  IncredibleEmployment.be  This test is no t yet approved or cleared by the [place] FDA and  has been authorized for detection and/or diagnosis of SARS-CoV-2 by FDA under an Emergency Use Authorization (EUA). This EUA will remain  in effect (meaning this test can be used) for the duration of the COVID-19 declaration under Section 564(b)(1) of the Act, 21 U.S.C.section 360bbb-3(b)(1), unless the authorization is terminated  or revoked sooner.       Influenza A by PCR NEGATIVE NEGATIVE Final   Influenza B by PCR NEGATIVE NEGATIVE Final    Comment: (NOTE) The Xpert Xpress SARS-CoV-2/FLU/RSV plus assay is intended as an aid in the diagnosis of influenza from Nasopharyngeal swab specimens and should not be used as a sole basis for treatment. Nasal washings and aspirates are unacceptable for Xpert Xpress  SARS-CoV-2/FLU/RSV testing.  Fact Sheet for Patients: EntrepreneurPulse.com.au  Fact Sheet for Healthcare Providers: IncredibleEmployment.be  This test is not yet approved or cleared by the [place] FDA and has been authorized for detection and/or diagnosis of SARS-CoV-2 by FDA under an Emergency Use Authorization (EUA). This EUA will remain in effect (meaning this test can be used) for the duration of the COVID-19 declaration under Section 564(b)(1) of the Act, 21 U.S.C. section 360bbb-3(b)(1), unless the authorization is terminated or  revoked.     Resp Syncytial Virus by PCR NEGATIVE NEGATIVE Final    Comment: (NOTE) Fact Sheet for Patients: EntrepreneurPulse.com.au  Fact Sheet for Healthcare Providers: IncredibleEmployment.be  This test is not yet approved or cleared by the [place] FDA and has been authorized for detection and/or diagnosis of SARS-CoV-2 by FDA under an Emergency Use Authorization (EUA). This EUA will remain in effect (meaning this test can be used) for the duration of the COVID-19 declaration under Section 564(b)(1) of the Act, 21 U.S.C. section 360bbb-3(b)(1), unless the authorization is terminated or revoked.  Performed at [facility], [place] [address] [place], [place] [address]   Respiratory (~20 pathogens) panel by PCR     Status: None   Collection Time: 08/05/22  6:51 PM   Specimen: Nasopharyngeal Swab; Respiratory  Result Value Ref Range Status   Adenovirus NOT DETECTED NOT DETECTED Final   Coronavirus 229E NOT DETECTED NOT DETECTED Final    Comment: (NOTE) The Coronavirus on the Respiratory Panel, DOES NOT test for the novel  Coronavirus (2019 nCoV)    Coronavirus HKU1 NOT DETECTED NOT DETECTED Final   Coronavirus NL63 NOT DETECTED NOT DETECTED Final   Coronavirus OC43 NOT DETECTED NOT DETECTED Final   Metapneumovirus NOT DETECTED NOT  DETECTED Final   Rhinovirus / Enterovirus NOT DETECTED NOT DETECTED Final   Influenza A NOT DETECTED NOT DETECTED Final   Influenza B NOT DETECTED NOT DETECTED Final   Parainfluenza Virus 1 NOT DETECTED NOT DETECTED Final   Parainfluenza Virus 2 NOT DETECTED NOT DETECTED Final   Parainfluenza Virus 3 NOT DETECTED NOT DETECTED Final   Parainfluenza Virus 4 NOT DETECTED NOT DETECTED Final   Respiratory Syncytial Virus NOT DETECTED NOT DETECTED Final   Bordetella pertussis NOT DETECTED NOT DETECTED Final   Bordetella Parapertussis NOT DETECTED NOT DETECTED Final   Chlamydophila pneumoniae NOT DETECTED NOT DETECTED Final   Mycoplasma pneumoniae NOT DETECTED NOT DETECTED Final    Comment: Performed at [facility], [place]. [address], [place], [place] [address]  Culture, blood (routine x 2)     Status: None   Collection Time: 08/05/22  9:04 PM   Specimen: BLOOD RIGHT HAND  Result Value Ref Range Status   Specimen Description   Final    BLOOD RIGHT HAND Performed at [facility], [place] [address], [place], [place] [address]    Special Requests   Final    BOTTLES DRAWN AEROBIC ONLY Blood Culture adequate volume Performed at [facility] [address], [place], [place] [address]    Culture   Final    NO GROWTH 5 DAYS Performed at [facility], [place] [address], [place], [place] [address]    Report Status 08/11/2022 FINAL  Final  MRSA Next Gen by PCR, Nasal     Status: None   Collection Time: 08/05/22  9:45 PM   Specimen: Nasal Mucosa; Nasal Swab  Result Value Ref Range Status   MRSA by PCR Next Gen NOT DETECTED NOT DETECTED Final    Comment: (NOTE) The GeneXpert MRSA Assay (FDA approved for NASAL specimens only), is one component of a comprehensive MRSA colonization surveillance program. It is not intended to diagnose MRSA infection nor to guide or monitor treatment for MRSA infections. Test performance is not FDA approved in patients less than 55  years old. Performed at [facility], [place] [address], [place], [place] [address]  Radiology Studies: No results found.      Scheduled Meds:  amLODipine  5 mg Oral Daily   arformoterol  15 mcg Nebulization BID   ARIPiprazole  12.5 mg Oral Daily   budesonide (PULMICORT) nebulizer solution  0.5 mg Nebulization BID   Chlorhexidine Gluconate Cloth  6 each Topical Daily   clotrimazole  1 Applicatorful Vaginal QHS   enoxaparin (LOVENOX) injection  70 mg Subcutaneous Q24H   gabapentin  800 mg Oral TID   guaiFENesin  1,200 mg Oral BID   ipratropium  0.5 mg Nebulization BID   levalbuterol  0.63 mg Nebulization BID   lidocaine  1 patch Transdermal Daily   lisinopril  10 mg Oral Daily   mouth rinse  15 mL Mouth Rinse 4 times per day   pantoprazole  40 mg Oral Daily   Continuous Infusions:  sodium chloride 10 mL/hr at 08/10/22 1100     LOS: 6 days   Marylu Lund, MD [facility]   To contact the attending provider between 7A-7P or the covering provider during after hours 7P-7A, please log into the web site [email or web address] and access using universal [place] password for that web site. If you do not have the password, please call the hospital operator.  08/11/2022, 1:53 PM

## 2022-08-11 NOTE — TOC Progression Note (Addendum)
Transition of Care [facility]) - Progression Note    Patient Details  Name: Sarah Cervantes MRN: [id number] Date of Birth: 1980-08-30  Transition of Care [facility]) CM/SW [place], RN Phone Number: 08/11/2022, 6:55 PM  Clinical Narrative:    This RNCM received secure chat from [place] NP, requesting referral for inpatient substance abuse to [facility]. This RNCM faxed clinical and regional referral form to [facility]  fax [phone].  TOC following for needs.     Barriers to Discharge: Continued Medical Work up  Expected Discharge Plan and Services In-house Referral: NA Discharge Planning Services: CM Consult   Living arrangements for the past 2 months: Hotel/Motel                                       Social Determinants of Health (SDOH) Interventions SDOH Screenings   Food Insecurity: Food Insecurity Present (08/06/2022)  Housing: Medium Risk (08/06/2022)  Transportation Needs: No Transportation Needs (08/06/2022)  Utilities: Not At Risk (08/06/2022)  Alcohol Screen: Medium Risk (04/10/2018)  Tobacco Use: High Risk (08/05/2022)    Readmission Risk Interventions     No data to display
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
## 2022-08-12 LAB — COMPREHENSIVE METABOLIC PANEL
ALT: 12 U/L (ref 0–44)
AST: 12 U/L — ABNORMAL LOW (ref 15–41)
Albumin: 3.3 g/dL — ABNORMAL LOW (ref 3.5–5.0)
Alkaline Phosphatase: 52 U/L (ref 38–126)
Anion gap: 5 (ref 5–15)
BUN: 23 mg/dL — ABNORMAL HIGH (ref 6–20)
CO2: 28 mmol/L (ref 22–32)
Calcium: 8.9 mg/dL (ref 8.9–10.3)
Chloride: 101 mmol/L (ref 98–111)
Creatinine, Ser: 0.79 mg/dL (ref 0.44–1.00)
GFR, Estimated: >60 mL/min (ref >60)
Glucose, Bld: 131 mg/dL — ABNORMAL HIGH (ref 70–99)
Potassium: 4.7 mmol/L (ref 3.5–5.1)
Sodium: 134 mmol/L — ABNORMAL LOW (ref 135–145)
Total Bilirubin: 0.4 mg/dL (ref 0.3–1.2)
Total Protein: 7 g/dL (ref 6.5–8.1)

## 2022-08-12 LAB — GLUCOSE, CAPILLARY: Glucose-Capillary: 110 mg/dL — ABNORMAL HIGH (ref 70–99)

## 2022-08-12 LAB — CBC
HCT: 42.1 % (ref 36.0–46.0)
Hemoglobin: 13 g/dL (ref 12.0–15.0)
MCH: 29 pg (ref 26.0–34.0)
MCHC: 30.9 g/dL (ref 30.0–36.0)
MCV: 93.8 fL (ref 80.0–100.0)
Platelets: 330 10*3/uL (ref 150–400)
RBC: 4.49 MIL/uL (ref 3.87–5.11)
RDW: 16 % — ABNORMAL HIGH (ref 11.5–15.5)
WBC: 7.6 10*3/uL (ref 4.0–10.5)
nRBC: 0 % (ref 0.0–0.2)

## 2022-08-12 MED ORDER — ORAL CARE MOUTH RINSE: 15.0000 mL | OROMUCOSAL | Status: DC | PRN

## 2022-08-12 MED ORDER — LIDOCAINE VISCOUS HCL 2 % MT SOLN
15.0000 mL | Freq: Four times a day (QID) | OROMUCOSAL | Status: DC | PRN
  Administered 2022-08-12 – 2022-08-13 (×2): 15 mL via OROMUCOSAL
  Filled 2022-08-12 (×3): qty 15

## 2022-08-12 MED ORDER — BENZOCAINE 10 % MT GEL
OROMUCOSAL | Status: DC | PRN
  Administered 2022-08-14: 1 via OROMUCOSAL
  Filled 2022-08-12 (×2): qty 9.4

## 2022-08-12 NOTE — Progress Notes (Signed)
Pt refusing cpap for the night. RN aware.

## 2022-08-12 NOTE — Progress Notes (Signed)
PROGRESS NOTE    Sarah Cervantes  [id number] DOB: December 25, 1980 DOA: 08/05/2022 PCP: Patient, No Pcp Per    Chief Complaint  Patient presents with   Shortness of Breath   Fall   Suicidal    Brief Narrative:  Sarah Cervantes is a 42 y.o. female with medical history significant for but not limited too polysubstance abuse including cocaine daily use, ethanol abuse, heroin abuse, history of alcohol abuse, COPD who supposed to be on 3 L supplemental oxygen chronically, previous suicide attempts, depression and anxiety as well as bipolar 1 disorder and other comorbidities who presents to the ED after becoming more short of breath and having a fall and having suicidal intent.  Of note history is taken from the chart and from the patient does she is extremely somnolent and falls asleep and becomes drowsy when speaking with her.  Reportedly the patient had a fall yesterday and has been more short of breath and has been having suicidal ideation.  After a fall she complained of left knee pain and neck pain status post fall.  Normally she is post ventilator and supplemental O2 via nasal cannula at baseline however has not used supplemental oxygen at least 3 days and this morning she took 4 Tylenol PMs and endorsed that "I just do not want to be here under more".  She presented for shortness of breath as well but she does endorse suicidal ideation with plans to overdose on pills.  She states that the shortness of breath got worse yesterday and she continues to be drowsy and falls asleep on my examination.  She recently was admitted to the inpatient psychiatric unit and presents with worsening shortness of breath.  She could not tell me if she is having chest pain or discomfort.  She been feeling poorly and "sick" for the last few days but did not elaborate on what she is feeling given her current condition.  Further evaluation and workup was done and she had a CT of the chest which showed "Small right pleural  effusion which appears partially loculated in the major fissure and lateral aspect of the right lower hemithorax. Multifocal ground-glass opacities throughout the bilateral lungs more prominent in the upper lobes. Findings are nonspecific and can be seen in the setting of multifocal pneumonia, pulmonary edema or hemorrhage. Mild  cardiomegaly. There are areas of atelectasis throughout the right lung with a small amount of airspace disease in the right middle lobe worrisome for infection."   Patient was placed on supplemental oxygen and continued be drowsy but did endorse suicidal ideation.  TRH was asked to admit this patient for acute on chronic hypoxic respiratory failure in the setting of multifocal pneumonia and partially loculated small pleural effusion as well as suicidal ideation with intent to overdose on pills.  Psychiatry was consulted for further evaluation given her suicidal attempt   **Interim History Patient was able to be weaned off of BiPAP for a little while and had to be placed on 6 L.  Continues to cough up quite a bit of productive sputum.  SLP has cleared the patient from their standpoint.  Psychiatry still recommends inpatient hospitalization.  Blood pressure remains elevated so she has been added back on her blood pressure medications and will avoid beta-blockers given her cocaine use.  Patient continued be somewhat anxious so her Atarax was also added back today.  Will need to continue monitor respiratory status and wean off of the BiPAP the 3 L of supplemental oxygen that  she is supposed to be wearing.   **She intermittently needs to go back on the BiPAP and was on it again the morning of 08/08/2022.  PT OT has been consulted patient initially refused however seen by PT OT today and no acute needs at this time.  Psychiatry recommends continuing current plan at this time and recommending to encourage the patient to participate in therapies with physical and Occupational Therapy given  that decreasing her immobilization will lead to deconditioning.   Assessment & Plan:   Principal Problem:   Multifocal pneumonia Active Problems:   Substance induced mood disorder (HCC)   Suicidal ideation   Hypokalemia   Acute metabolic encephalopathy   Essential hypertension   Gastroesophageal reflux disease   Morbid obesity with BMI of 50.0-59.9, adult (HCC)  #1 acute on chronic respiratory failure with hypoxia/history of obesity hypoventilation syndrome -Patient noted on admission and early on during the hospitalization to be somnolent and drowsy with increased respiratory rate initially breathing on 30 times a minute but somewhat improving. -Patient required intermittent BiPAP but currently on 3 L high flow nasal cannula. -Patient states was chronically on 3 L home O2. -CT chest done with small right pleural effusion appears partially loculated in the major fissure lateral aspect of the right lower hemithorax, multifocal groundglass opacities throughout bilateral lungs more prominent in upper lobes findings are nonspecific can be seen in multifocal pneumonia, pulmonary edema or hemorrhage.  Mild cardiomegaly.  Areas of atelectasis throughout the right lung with a small amount of airspace disease in the right middle lobe worrisome for infection.  Follow-up imaging recommended 3 to 6 weeks to confirm resolution. -BNP noted at 49.6 on admission. -Respiratory viral panel negative. -MRSA PCR negative. -SARS coronavirus 2 PCR negative, influenza A and B PCR negative, RSV PCR negative. -ABG with a pH of 7.41, pO2 of 59, bicarb of 29, O2 sats of 92.4. -Patient with a urine output of 1.1 L recorded over the past 24 hours.  -Patient status post IV Lasix x 2 doses 08/09/2022.   -Urine pneumococcus antigen negative, urine Legionella antigen negative. -Status post 5 days IV Rocephin and IV azithromycin.  -Pulmonary toileting. -Continue Mucinex, scheduled nebs, Brovana, Pulmicort, PPI. -BiPAP  noted to have been required early on in the hospitalization however currently has been discontinued. - CPAP QHS as patient also noted with OSA.  2.  Acute metabolic encephalopathy -Noted with some intermittent somnolence and drowsiness could likely be in the setting of problem #1. -Per prior physician patient noted to have taken 4 Tylenol PMs on killing etiology what else patient took. -UDS done was positive for cocaine, amphetamines, THC. -Salicylate level < 7. -Alcohol level < 10 -EKG with no ischemic changes.  No QT prolongation. -Patient with clinical improvement and likely close to baseline.. -Status post 5 days IV antibiotics.   3.  Hypokalemia -Repleted. -Labs pending this morning.  4.  Suicidal ideation/history of bipolar 1 disorder -Patient noted to be somnolent and drowsy early on during the hospitalization however endorsed suicidal ideation and requiring one-to-one sitter. -Patient initially noted to have been involuntarily committed, psychiatry consulted and recommended inpatient psychiatric hospitalization once medically stable. -Atarax resumed for anxiety. -Patient seen by psychiatry and patient willing to voluntary inpatient admission, resumption of home medications recommended by psychiatry and patient placed back on Abilify, gabapentin. -Prozac and trazodone resumed 08/11/2022 per psychiatry. -Per psychiatry.  5.  Hypertension -Blood pressure control on current regimen of Norvasc, lisinopril.    6.  GERD -PPI.  7.  Thrombocytosis -Likely reactive. -Improved.  8.  Super morbid obesity/OHS -BMI 53.89 -Lifestyle modification. -Outpatient follow-up with PCP.  47.  Fall -PT/ OT ordered, patient initially refused however patient assessed by PT/OT and has been working with therapy.   10.  Polysubstance abuse -Patient noted with history of heroin abuse, cocaine use. -Alcohol level noted at < 10. -UDS done positive for cocaine, amphetamines, THC. -Patient was  placed on the clonidine detox protocol.   11.  OSA -Continue CPAP nightly.  -Will need outpatient follow-up with pulmonary for further evaluation of a OSA and to be set up for CPAP machine in the outpatient setting..   DVT prophylaxis: Lovenox Code Status: Full Family Communication: Updated patient.  No family at bedside. Disposition: Transfer to floor today.  Inpatient psychiatry when medically stable and clinically improved.  Status is: Inpatient Remains inpatient appropriate because: Severity of illness   Consultants:  Psychiatry: Dr. Dwyane Dee 08/07/2022  Procedures:  CT chest 08/05/2022 Chest x-ray 08/06/2022, 08/07/2022, 08/08/2022, 08/09/2022  Antimicrobials: IV azithromycin 08/05/2022>>>> 08/09/2022 IV Rocephin 08/05/2022>>>> 08/10/2022   Subjective: Laying in bed.  Overall feeling better.  On 3 L high flow nasal cannula with sats in the mid 90s.  Denies any chest pain.  No abdominal pain.  Tolerated CPAP overnight per patient.  States was told cannot go to inpatient psych on oxygen.    Objective: Vitals:   08/12/22 0600 08/12/22 0709 08/12/22 0721 08/12/22 0752  BP: (!) 143/61     Pulse: 68 66 66   Resp:  19 20   Temp:    98.2 F (36.8 C)  TempSrc:    Axillary  SpO2: 97% 93% 98%   Weight:      Height:        Intake/Output Summary (Last 24 hours) at 08/12/2022 0946 Last data filed at 08/12/2022 0600 Gross per 24 hour  Intake 709 ml  Output 1150 ml  Net -441 ml    Filed Weights   08/10/22 0900 08/11/22 0500 08/12/22 0151  Weight: (!) 143.5 kg (!) 143.2 kg (!) 145.5 kg    Examination:  General exam: Sleeping.  NAD.  On 3 L nasal cannula. Respiratory system: Fair air movement.  Some coarse breath sounds right.  No wheezing.  Fair air movement.  Speaking in full sentences.  On 3 L nasal cannula high flow. Cardiovascular system: RRR no murmurs rubs or gallops.  No JVD.  Trace bilateral lower extremity edema.  Gastrointestinal system: Abdomen is obese, soft, nontender,  nondistended, positive bowel sounds.  No rebound.  No guarding.  Central nervous system: Alert and oriented. No focal neurological deficits. Extremities: Symmetric 5 x 5 power. Skin: No rashes, lesions or ulcers Psychiatry: Judgement and insight appear fair. Mood & affect flat.     Data Reviewed: I have personally reviewed following labs and imaging studies  CBC: Recent Labs  Lab 08/06/22 0327 08/08/22 0255 08/09/22 0314 08/10/22 0313 08/11/22 0330  WBC 8.5 8.8 9.5 8.1 7.9  NEUTROABS 4.9 5.1 5.3 4.2 4.3  HGB 13.4 13.8 14.5 13.6 13.2  HCT 42.7 44.3 46.3* 44.0 42.1  MCV 94.5 92.7 93.2 94.4 93.8  PLT 442* 377 360 335 308     Basic Metabolic Panel: Recent Labs  Lab 08/06/22 0327 08/07/22 1040 08/08/22 0255 08/09/22 0314 08/10/22 0313 08/11/22 0330  NA 138 137 136 136 135 134*  K 3.8 3.9 4.0 4.6 4.5 4.9  CL 109 102 102 101 99 100  CO2 25 23 25 26  27  26  GLUCOSE 97 129* 133* 112* 106* 102*  BUN 10 12 15 15  24* 29*  CREATININE 0.58 0.52 0.70 0.82 0.93 0.72  CALCIUM 8.3* 8.4* 8.6* 8.8* 8.6* 8.7*  MG 2.3 2.1 2.2 2.3 2.5* 2.2  PHOS 4.1 3.2 5.1* 3.4  --   --      GFR: Estimated Creatinine Clearance: 130.3 mL/min (by C-G formula based on SCr of 0.72 mg/dL).  Liver Function Tests: Recent Labs  Lab 08/06/22 0327 08/07/22 1040 08/08/22 0255 08/09/22 0314 08/10/22 0313  AST 13* 11* 12* 15 14*  ALT 13 13 14 13 13   ALKPHOS 56 55 51 55 50  BILITOT 0.6 0.4 0.4 0.6 0.7  PROT 7.3 7.6 7.4 7.4 7.1  ALBUMIN 3.1* 3.2* 3.2* 3.2* 3.2*     CBG: Recent Labs  Lab 08/07/22 0750 08/08/22 0744 08/09/22 0732 08/10/22 0819 08/11/22 0809  GLUCAP 121* 136* 114* 124* 110*      Recent Results (from the past 240 hour(s))  Culture, blood (routine x 2)     Status: None   Collection Time: 08/05/22  4:35 PM   Specimen: BLOOD RIGHT FOREARM  Result Value Ref Range Status   Specimen Description   Final    BLOOD RIGHT FOREARM Performed at [facility], [place] [address], [place], [place] [address]    Special Requests   Final    BOTTLES DRAWN AEROBIC AND ANAEROBIC Blood Culture adequate volume Performed at [place] [address], [place], [place] [address]    Culture   Final    NO GROWTH 5 DAYS Performed at [facility], [place] [address] [place], [place] [address]    Report Status 08/11/2022 FINAL  Final  Resp panel by RT-PCR (RSV, Flu A&B, Covid) Anterior Nasal Swab     Status: None   Collection Time: 08/05/22  6:51 PM   Specimen: Anterior Nasal Swab  Result Value Ref Range Status   SARS Coronavirus 2 by RT PCR NEGATIVE NEGATIVE Final    Comment: (NOTE) SARS-CoV-2 target nucleic acids are NOT DETECTED.  The SARS-CoV-2 RNA is generally detectable in upper respiratory specimens during the acute phase of infection. The lowest concentration of SARS-CoV-2 viral copies this assay can detect is 138 copies/mL. A negative result does not preclude SARS-Cov-2 infection and should not be used as the sole basis for treatment or other patient management decisions. A negative result may occur with  improper specimen collection/handling, submission of specimen other than nasopharyngeal swab, presence of viral mutation(s) within the areas targeted by this assay, and inadequate number of viral copies(<138 copies/mL). A negative result must be combined with clinical observations, patient history, and epidemiological information. The expected result is Negative.  Fact Sheet for Patients:  EntrepreneurPulse.com.au  Fact Sheet for Healthcare Providers:  IncredibleEmployment.be  This test is no t yet approved or cleared by the [place] FDA and  has been authorized for detection and/or diagnosis of SARS-CoV-2 by FDA under an Emergency Use Authorization (EUA). This EUA will remain  in effect (meaning this test can be used) for the duration of the COVID-19 declaration under Section 564(b)(1) of the  Act, 21 U.S.C.section 360bbb-3(b)(1), unless the authorization is terminated  or revoked sooner.       Influenza A by PCR NEGATIVE NEGATIVE Final   Influenza B by PCR NEGATIVE NEGATIVE Final    Comment: (NOTE) The Xpert Xpress SARS-CoV-2/FLU/RSV plus assay is intended as an aid in the diagnosis of influenza from  Nasopharyngeal swab specimens and should not be used as a sole basis for treatment. Nasal washings and aspirates are unacceptable for Xpert Xpress SARS-CoV-2/FLU/RSV testing.  Fact Sheet for Patients: EntrepreneurPulse.com.au  Fact Sheet for Healthcare Providers: IncredibleEmployment.be  This test is not yet approved or cleared by the [place] FDA and has been authorized for detection and/or diagnosis of SARS-CoV-2 by FDA under an Emergency Use Authorization (EUA). This EUA will remain in effect (meaning this test can be used) for the duration of the COVID-19 declaration under Section 564(b)(1) of the Act, 21 U.S.C. section 360bbb-3(b)(1), unless the authorization is terminated or revoked.     Resp Syncytial Virus by PCR NEGATIVE NEGATIVE Final    Comment: (NOTE) Fact Sheet for Patients: EntrepreneurPulse.com.au  Fact Sheet for Healthcare Providers: IncredibleEmployment.be  This test is not yet approved or cleared by the [place] FDA and has been authorized for detection and/or diagnosis of SARS-CoV-2 by FDA under an Emergency Use Authorization (EUA). This EUA will remain in effect (meaning this test can be used) for the duration of the COVID-19 declaration under Section 564(b)(1) of the Act, 21 U.S.C. section 360bbb-3(b)(1), unless the authorization is terminated or revoked.  Performed at [facility], [place] [address], [place], [place] [address]   Respiratory (~20 pathogens) panel by PCR     Status: None   Collection Time: 08/05/22  6:51 PM   Specimen:  Nasopharyngeal Swab; Respiratory  Result Value Ref Range Status   Adenovirus NOT DETECTED NOT DETECTED Final   Coronavirus 229E NOT DETECTED NOT DETECTED Final    Comment: (NOTE) The Coronavirus on the Respiratory Panel, DOES NOT test for the novel  Coronavirus (2019 nCoV)    Coronavirus HKU1 NOT DETECTED NOT DETECTED Final   Coronavirus NL63 NOT DETECTED NOT DETECTED Final   Coronavirus OC43 NOT DETECTED NOT DETECTED Final   Metapneumovirus NOT DETECTED NOT DETECTED Final   Rhinovirus / Enterovirus NOT DETECTED NOT DETECTED Final   Influenza A NOT DETECTED NOT DETECTED Final   Influenza B NOT DETECTED NOT DETECTED Final   Parainfluenza Virus 1 NOT DETECTED NOT DETECTED Final   Parainfluenza Virus 2 NOT DETECTED NOT DETECTED Final   Parainfluenza Virus 3 NOT DETECTED NOT DETECTED Final   Parainfluenza Virus 4 NOT DETECTED NOT DETECTED Final   Respiratory Syncytial Virus NOT DETECTED NOT DETECTED Final   Bordetella pertussis NOT DETECTED NOT DETECTED Final   Bordetella Parapertussis NOT DETECTED NOT DETECTED Final   Chlamydophila pneumoniae NOT DETECTED NOT DETECTED Final   Mycoplasma pneumoniae NOT DETECTED NOT DETECTED Final    Comment: Performed at [facility], [place]. [address] [place], [place] [address]  Culture, blood (routine x 2)     Status: None   Collection Time: 08/05/22  9:04 PM   Specimen: BLOOD RIGHT HAND  Result Value Ref Range Status   Specimen Description   Final    BLOOD RIGHT HAND Performed at [facility], [place] [address] [place], [place] [address]    Special Requests   Final    BOTTLES DRAWN AEROBIC ONLY Blood Culture adequate volume Performed at [facility] [address] [place], [place] [address]    Culture   Final    NO GROWTH 5 DAYS Performed at [facility], [place] [address] [place], [place] [address]    Report Status 08/11/2022 FINAL  Final  MRSA Next Gen by PCR, Nasal     Status: None   Collection Time:  08/05/22  9:45 PM   Specimen: Nasal Mucosa; Nasal Swab  Result Value Ref Range Status   MRSA by PCR Next Gen NOT DETECTED NOT DETECTED Final    Comment: (NOTE) The GeneXpert MRSA Assay (FDA approved for NASAL specimens only), is one component of a comprehensive MRSA colonization surveillance program. It is not intended to diagnose MRSA infection nor to guide or monitor treatment for MRSA infections. Test performance is not FDA approved in patients less than 4 years old. Performed at [facility], [place] [address], [place], [place] [address]          Radiology Studies: No results found.      Scheduled Meds:  amLODipine  5 mg Oral Daily   arformoterol  15 mcg Nebulization BID   ARIPiprazole  12.5 mg Oral Daily   budesonide (PULMICORT) nebulizer solution  0.5 mg Nebulization BID   Chlorhexidine Gluconate Cloth  6 each Topical Daily   clotrimazole  1 Applicatorful Vaginal QHS   enoxaparin (LOVENOX) injection  70 mg Subcutaneous Q24H   FLUoxetine  40 mg Oral Daily   gabapentin  800 mg Oral TID   guaiFENesin  1,200 mg Oral BID   ipratropium  0.5 mg Nebulization BID   levalbuterol  0.63 mg Nebulization BID   lidocaine  1 patch Transdermal Daily   lisinopril  10 mg Oral Daily   mouth rinse  15 mL Mouth Rinse 4 times per day   pantoprazole  40 mg Oral Daily   traZODone  100 mg Oral QHS   Continuous Infusions:  sodium chloride Stopped (08/10/22 1114)     LOS: 7 days    Time spent: 35 minutes    Irine Seal, MD [facility]   To contact the attending provider between 7A-7P or the covering provider during after hours 7P-7A, please log into the web site [email or web address] and access using universal [place] password for that web site. If you do not have the password, please call the hospital operator.  08/12/2022, 9:46 AM

## 2022-08-13 LAB — BASIC METABOLIC PANEL
Anion gap: 8 (ref 5–15)
BUN: 25 mg/dL — ABNORMAL HIGH (ref 6–20)
CO2: 27 mmol/L (ref 22–32)
Calcium: 8.5 mg/dL — ABNORMAL LOW (ref 8.9–10.3)
Chloride: 101 mmol/L (ref 98–111)
Creatinine, Ser: 0.81 mg/dL (ref 0.44–1.00)
GFR, Estimated: >60 mL/min (ref >60)
Glucose, Bld: 103 mg/dL — ABNORMAL HIGH (ref 70–99)
Potassium: 4.3 mmol/L (ref 3.5–5.1)
Sodium: 136 mmol/L (ref 135–145)

## 2022-08-13 LAB — MAGNESIUM: Magnesium: 2.1 mg/dL (ref 1.7–2.4)

## 2022-08-13 NOTE — Progress Notes (Signed)
Physical Therapy Treatment Patient Details Name: Sarah Cervantes MRN: [id number] DOB: 07/17/1980 Today's Date: 08/13/2022   History of Present Illness Sarah Cervantes is a 42 y.o. female presents to the ED after becoming more short of breath and having a fall and having suicidal intent. Found to have acute on chronic hypoxic respiratory failure in the setting of multifocal pneumonia and partially loculated small pleural effusion. PHMx: significant for but not limited too polysubstance abuse including cocaine daily use, ethanol abuse, heroin abuse, history of alcohol abuse, COPD who supposed to be on 3 L supplemental oxygen chronically, previous suicide attempts, depression and anxiety as well as bipolar 1 disorder. Was here 2 weeks ago s/p fall wtih questionable ligamentuous injury to knee (placed in [place]) and suicidal ideations with D/C to inpatient psych.    PT Comments    Pt very cooperative and up to ambulate in hall with RW at supervision level.  Pt with noted increased WOB but maintaining O2 sats at 92% or higher with activity.   Recommendations for follow up therapy are one component of a multi-disciplinary discharge planning process, led by the attending physician.  Recommendations may be updated based on patient status, additional functional criteria and insurance authorization.  Follow Up Recommendations  No PT follow up Can patient physically be transported by private vehicle: Yes   Assistance Recommended at Discharge PRN  Patient can return home with the following Assistance with cooking/housework;Assist for transportation;Help with stairs or ramp for entrance;A little help with walking and/or transfers   Equipment Recommendations  Rolling walker (2 wheels)    Recommendations for Other Services       Precautions / Restrictions Precautions Precautions: Fall Precaution Comments: left knee can buckle Restrictions Weight Bearing Restrictions: No Other Position/Activity  Restrictions: WBAT     Mobility  Bed Mobility Overal bed mobility: Needs Assistance Bed Mobility: Supine to Sit, Sit to Supine     Supine to sit: Supervision Sit to supine: Supervision   General bed mobility comments: no physical assist    Transfers Overall transfer level: Needs assistance Equipment used: Rolling walker (2 wheels) Transfers: Sit to/from Stand Sit to Stand: Supervision           General transfer comment: no physical assist    Ambulation/Gait Ambulation/Gait assistance: Min guard Gait Distance (Feet): 180 Feet Assistive device: Rolling walker (2 wheels) Gait Pattern/deviations: Step-to pattern, Step-through pattern, Decreased step length - right, Decreased step length - left, Shuffle Gait velocity: decreased     General Gait Details: min cues for posture and position from [employer]             Wheelchair Mobility    Modified Rankin (Stroke Patients Only)       Balance Overall balance assessment: Needs assistance Sitting-balance support: No upper extremity supported Sitting balance-Leahy Scale: Good     Standing balance support: During functional activity, No upper extremity supported Standing balance-Leahy Scale: Fair Standing balance comment: due to left knee                            Cognition Arousal/Alertness: Awake/alert Behavior During Therapy: WFL for tasks assessed/performed Overall Cognitive Status: Within Functional Limits for tasks assessed                                 General Comments: wants  to walk withoout O2 as she cannot have  it at [facility]        Exercises      General Comments        Pertinent Vitals/Pain Pain Assessment Pain Assessment: Faces Faces Pain Scale: Hurts little more Pain Location: L knee Pain Descriptors / Indicators: Aching, Tightness Pain Intervention(s): Limited activity within patient's tolerance, Monitored during session    Home Living                           Prior Function            PT Goals (current goals can now be found in the care plan section) Acute Rehab PT Goals Patient Stated Goal: Regain IND PT Goal Formulation: With patient Time For Goal Achievement: 08/23/22 Potential to Achieve Goals: Good Progress towards PT goals: Progressing toward goals    Frequency    Min 3X/week      PT Plan Current plan remains appropriate    Co-evaluation              AM-PAC PT "6 Clicks" Mobility   Outcome Measure  Help needed turning from your back to your side while in a flat bed without using bedrails?: A Little Help needed moving from lying on your back to sitting on the side of a flat bed without using bedrails?: A Little Help needed moving to and from a bed to a chair (including a wheelchair)?: A Little Help needed standing up from a chair using your arms (e.g., wheelchair or bedside chair)?: A Little Help needed to walk in hospital room?: A Little Help needed climbing 3-5 steps with a railing? : A Little 6 Click Score: 18    End of Session Equipment Utilized During Treatment: Gait belt Activity Tolerance: Patient tolerated treatment well Patient left: in bed;with call bell/phone within reach;with nursing/sitter in room Nurse Communication: Mobility status PT Visit Diagnosis: Other abnormalities of gait and mobility (R26.89);Muscle weakness (generalized) (M62.81)     Time: [id number] PT Time Calculation (min) (ACUTE ONLY): 13 min  Charges:  $Gait Training: 8-22 mins                     [facility] Pager [phone] Office [phone]    Ileen Kahre 08/13/2022, 12:18 PM

## 2022-08-13 NOTE — Plan of Care (Signed)
  Problem: Coping: Goal: Level of anxiety will decrease Outcome: Progressing   Problem: Safety: Goal: Ability to remain free from injury will improve Outcome: Progressing   Problem: OP Suicidal Ideation Goal: LTG: Placement in appropriate level of care to safely address suicidal or parasuicidal crisis, based on clinical assessment Outcome: Progressing Goal: LTG: Mycala will be free from thoughts of self-harm as evidenced by suicide assessment or self-reported score of ideations Outcome: Progressing Goal: LTG: Evah will be free of unhealthy self-harm as evidenced by self-report and examination as appropriate Outcome: Progressing Goal: LTG: Jackquelyn will utilize appropriate interventions from safety plan when distress or thoughts of self-harm occur Outcome: Progressing Goal: STG: Aranda will recognize triggers to thoughts of self-harm 80% of the time they occur Outcome: Progressing Goal: STG: Lashaunda will use active coping strategies weekly, as evidenced by journal entries, self-report and/or clinician observation Outcome: Progressing Goal: STG: Classie will participate in at least one social activity per week Outcome: Progressing Goal: STG: Educate Mikenzie on principles of suicide risk safety plan and levels of interventions Outcome: Progressing Goal: STG: Work with [name] to create and implement personal suicide risk safety plan Outcome: Progressing Goal: STG: Pascale will participate in scheduled individual sessions as requested by clinician Outcome: Progressing

## 2022-08-13 NOTE — Progress Notes (Signed)
Patient interactive and compliant with care, endorses that she is trying to wean off of the oxygen in order to go to the inpatient rehab center that she needs, does not endorse any suicide plans but reiterates that she does need "help" from the rehab center, patient noted to be on 2 liters nasal cannula with o2 sats around 95%, minimal sob on exertion, requesting to wear cpap tonight, sitter present at bedside, will continue to monitor.

## 2022-08-13 NOTE — Progress Notes (Signed)
Patient walking with PT down hall without O2, oxygen sats didn't go below 92%, and no SOB reported. Resting in room on RA, will place on cpap while sleeping.

## 2022-08-13 NOTE — Progress Notes (Signed)
PROGRESS NOTE    Sarah Cervantes  [id number] DOB: December 25, 1980 DOA: 08/05/2022 PCP: Patient, No Pcp Per    Chief Complaint  Patient presents with   Shortness of Breath   Fall   Suicidal    Brief Narrative:  Sarah Cervantes is a 42 y.o. female with medical history significant for but not limited too polysubstance abuse including cocaine daily use, ethanol abuse, heroin abuse, history of alcohol abuse, COPD who supposed to be on 3 L supplemental oxygen chronically, previous suicide attempts, depression and anxiety as well as bipolar 1 disorder and other comorbidities who presents to the ED after becoming more short of breath and having a fall and having suicidal intent.  Of note history is taken from the chart and from the patient does she is extremely somnolent and falls asleep and becomes drowsy when speaking with her.  Reportedly the patient had a fall yesterday and has been more short of breath and has been having suicidal ideation.  After a fall she complained of left knee pain and neck pain status post fall.  Normally she is post ventilator and supplemental O2 via nasal cannula at baseline however has not used supplemental oxygen at least 3 days and this morning she took 4 Tylenol PMs and endorsed that "I just do not want to be here under more".  She presented for shortness of breath as well but she does endorse suicidal ideation with plans to overdose on pills.  She states that the shortness of breath got worse yesterday and she continues to be drowsy and falls asleep on my examination.  She recently was admitted to the inpatient psychiatric unit and presents with worsening shortness of breath.  She could not tell me if she is having chest pain or discomfort.  She been feeling poorly and "sick" for the last few days but did not elaborate on what she is feeling given her current condition.  Further evaluation and workup was done and she had a CT of the chest which showed "Small right pleural  effusion which appears partially loculated in the major fissure and lateral aspect of the right lower hemithorax. Multifocal ground-glass opacities throughout the bilateral lungs more prominent in the upper lobes. Findings are nonspecific and can be seen in the setting of multifocal pneumonia, pulmonary edema or hemorrhage. Mild  cardiomegaly. There are areas of atelectasis throughout the right lung with a small amount of airspace disease in the right middle lobe worrisome for infection."   Patient was placed on supplemental oxygen and continued be drowsy but did endorse suicidal ideation.  TRH was asked to admit this patient for acute on chronic hypoxic respiratory failure in the setting of multifocal pneumonia and partially loculated small pleural effusion as well as suicidal ideation with intent to overdose on pills.  Psychiatry was consulted for further evaluation given her suicidal attempt   **Interim History Patient was able to be weaned off of BiPAP for a little while and had to be placed on 6 L.  Continues to cough up quite a bit of productive sputum.  SLP has cleared the patient from their standpoint.  Psychiatry still recommends inpatient hospitalization.  Blood pressure remains elevated so she has been added back on her blood pressure medications and will avoid beta-blockers given her cocaine use.  Patient continued be somewhat anxious so her Atarax was also added back today.  Will need to continue monitor respiratory status and wean off of the BiPAP the 3 L of supplemental oxygen that  she is supposed to be wearing.   **She intermittently needs to go back on the BiPAP and was on it again the morning of 08/08/2022.  PT OT has been consulted patient initially refused however seen by PT OT today and no acute needs at this time.  Psychiatry recommends continuing current plan at this time and recommending to encourage the patient to participate in therapies with physical and Occupational Therapy given  that decreasing her immobilization will lead to deconditioning.   Assessment & Plan:   Principal Problem:   Multifocal pneumonia Active Problems:   Substance induced mood disorder (HCC)   Suicidal ideation   Hypokalemia   Acute metabolic encephalopathy   Essential hypertension   Gastroesophageal reflux disease   Morbid obesity with BMI of 50.0-59.9, adult (HCC)  #1 acute on chronic respiratory failure with hypoxia/history of obesity hypoventilation syndrome -Patient noted on admission and early on during the hospitalization to be somnolent and drowsy with increased respiratory rate initially breathing on 30 times a minute but somewhat improving. -Patient required intermittent BiPAP but currently on 3 L high flow nasal cannula. -Patient states was chronically on 3 L home O2. -CT chest done with small right pleural effusion appears partially loculated in the major fissure lateral aspect of the right lower hemithorax, multifocal groundglass opacities throughout bilateral lungs more prominent in upper lobes findings are nonspecific can be seen in multifocal pneumonia, pulmonary edema or hemorrhage.  Mild cardiomegaly.  Areas of atelectasis throughout the right lung with a small amount of airspace disease in the right middle lobe worrisome for infection.  Follow-up imaging recommended 3 to 6 weeks to confirm resolution. -BNP noted at 49.6 on admission. -Respiratory viral panel negative. -MRSA PCR negative. -SARS coronavirus 2 PCR negative, influenza A and B PCR negative, RSV PCR negative. -ABG with a pH of 7.41, pO2 of 59, bicarb of 29, O2 sats of 92.4. -Patient with a urine output of 1.1 L recorded over the past 24 hours.  -Patient status post IV Lasix x 2 doses 08/09/2022.   -Urine pneumococcus antigen negative, urine Legionella antigen negative. -Status post 5 days IV Rocephin and IV azithromycin.  -Pulmonary toileting. -Continue Mucinex, scheduled nebs, Brovana, Pulmicort, PPI. -BiPAP  noted to have been required early on in the hospitalization however currently has been discontinued. - CPAP QHS as patient also noted with OSA.  2.  Acute metabolic encephalopathy -Noted early on in the hospitalization, with some intermittent somnolence and drowsiness could likely be in the setting of problem #1. -Per prior physician patient noted to have taken 4 Tylenol PMs on killing etiology what else patient took. -UDS done was positive for cocaine, amphetamines, THC. -Salicylate level < 7. -Alcohol level < 10 -EKG with no ischemic changes.  No QT prolongation. -Patient with clinical improvement and likely close to baseline.. -Status post 5 days IV antibiotics.   3.  Hypokalemia -Repleted.  Potassium at 4.3.  4.  Suicidal ideation/history of bipolar 1 disorder -Patient noted to be somnolent and drowsy early on during the hospitalization however endorsed suicidal ideation and requiring one-to-one sitter. -Patient initially noted to have been involuntarily committed, psychiatry consulted and recommended inpatient psychiatric hospitalization once medically stable. -Atarax resumed for anxiety. -Patient seen by psychiatry and patient willing to voluntary inpatient admission, resumption of home medications recommended by psychiatry and patient placed back on Abilify, gabapentin. -Prozac and trazodone resumed 08/11/2022 per psychiatry. -Per psychiatry.  5.  Hypertension -Controlled on current regimen of Norvasc, lisinopril.   6.  GERD -  Continue PPI.  7.  Thrombocytosis -Likely reactive. -Improved.  8.  Super morbid obesity/OHS -BMI 53.89 -Lifestyle modification. -Outpatient follow-up with PCP.  16.  Fall -PT/ OT ordered, and working with patient.  10.  Polysubstance abuse -Patient noted with history of heroin abuse, cocaine use. -Alcohol level noted at < 10. -UDS done positive for cocaine, amphetamines, THC. -Patient was placed on the clonidine detox protocol.   11.   OSA -Continue CPAP nightly.  -Will need outpatient follow-up with pulmonary for further evaluation of a OSA and to be set up for CPAP machine in the outpatient setting.  12.  Tooth pain -Lidocaine swish and swallow, oral gel as needed. -Will need outpatient follow-up with dentistry.   DVT prophylaxis: Lovenox Code Status: Full Family Communication: Updated patient.  No family at bedside. Disposition: Patient medically stable.  Inpatient psychiatry hopefully in the next 24 hours.   Status is: Inpatient Remains inpatient appropriate because: Severity of illness   Consultants:  Psychiatry: Dr. Dwyane Dee 08/07/2022  Procedures:  CT chest 08/05/2022 Chest x-ray 08/06/2022, 08/07/2022, 08/08/2022, 08/09/2022  Antimicrobials: IV azithromycin 08/05/2022>>>> 08/09/2022 IV Rocephin 08/05/2022>>>> 08/10/2022   Subjective: Laying in bed.  States she chipped her tooth left lower molar on rice and with complaints of oral/mouth pain.  States oral gel and lidocaine swish did not help much with the pain.  Tolerating CPAP.  Per RN patient ambulated in hallway with therapy with sats maintaining above 90%.  Patient still with suicidal ideation.   Objective: Vitals:   08/12/22 2048 08/13/22 0639 08/13/22 0703 08/13/22 1001  BP: 117/65 126/76  125/79  Pulse: 71 70  77  Resp: 20 20  19   Temp: 97.9 F (36.6 C) 97.9 F (36.6 C)  97.9 F (36.6 C)  TempSrc:      SpO2: 95% 96%  92%  Weight:   (!) 146.4 kg   Height:        Intake/Output Summary (Last 24 hours) at 08/13/2022 1048 Last data filed at 08/13/2022 1000 Gross per 24 hour  Intake 1057 ml  Output --  Net 1057 ml    Filed Weights   08/11/22 0500 08/12/22 0151 08/13/22 0703  Weight: (!) 143.2 kg (!) 145.5 kg (!) 146.4 kg    Examination:  General exam: Awake.  Laying in bed.  NAD.  On room air.   Respiratory system: Some decreased breath sounds in the bases.  Lungs otherwise clear.  No wheezes, no crackles, no rhonchi.  Fair air movement.   Speaking in full sentences.  On room air.  Cardiovascular system: Regular rate and rhythm no murmurs rubs or gallops.  No JVD.  No lower extremity edema.  Gastrointestinal system: Abdomen is soft, obese, nontender, nondistended, positive bowel sounds.  No rebound.  No guarding.  Central nervous system: Alert and oriented. No focal neurological deficits. Extremities: Symmetric 5 x 5 power. Skin: No rashes, lesions or ulcers Psychiatry: Judgement and insight appear poor to fair. Mood & affect flat.     Data Reviewed: I have personally reviewed following labs and imaging studies  CBC: Recent Labs  Lab 08/08/22 0255 08/09/22 0314 08/10/22 0313 08/11/22 0330 08/12/22 0917  WBC 8.8 9.5 8.1 7.9 7.6  NEUTROABS 5.1 5.3 4.2 4.3  --   HGB 13.8 14.5 13.6 13.2 13.0  HCT 44.3 46.3* 44.0 42.1 42.1  MCV 92.7 93.2 94.4 93.8 93.8  PLT 377 360 335 308 330     Basic Metabolic Panel: Recent Labs  Lab 08/07/22 1040 08/08/22 0255  08/09/22 0314 08/10/22 0313 08/11/22 0330 08/12/22 0917 08/13/22 0311  NA 137 136 136 135 134* 134* 136  K 3.9 4.0 4.6 4.5 4.9 4.7 4.3  CL 102 102 101 99 100 101 101  CO2 23 25 26 27 26 28 27   GLUCOSE 129* 133* 112* 106* 102* 131* 103*  BUN 12 15 15  24* 29* 23* 25*  CREATININE 0.52 0.70 0.82 0.93 0.72 0.79 0.81  CALCIUM 8.4* 8.6* 8.8* 8.6* 8.7* 8.9 8.5*  MG 2.1 2.2 2.3 2.5* 2.2  --  2.1  PHOS 3.2 5.1* 3.4  --   --   --   --      GFR: Estimated Creatinine Clearance: 129.2 mL/min (by C-G formula based on SCr of 0.81 mg/dL).  Liver Function Tests: Recent Labs  Lab 08/07/22 1040 08/08/22 0255 08/09/22 0314 08/10/22 0313 08/12/22 0917  AST 11* 12* 15 14* 12*  ALT 13 14 13 13 12   ALKPHOS 55 51 55 50 52  BILITOT 0.4 0.4 0.6 0.7 0.4  PROT 7.6 7.4 7.4 7.1 7.0  ALBUMIN 3.2* 3.2* 3.2* 3.2* 3.3*     CBG: Recent Labs  Lab 08/08/22 0744 08/09/22 0732 08/10/22 0819 08/11/22 0809 08/12/22 0757  GLUCAP 136* 114* 124* 110* 110*      Recent Results  (from the past 240 hour(s))  Culture, blood (routine x 2)     Status: None   Collection Time: 08/05/22  4:35 PM   Specimen: BLOOD RIGHT FOREARM  Result Value Ref Range Status   Specimen Description   Final    BLOOD RIGHT FOREARM Performed at [facility] [address], [place], [place] [address]    Special Requests   Final    BOTTLES DRAWN AEROBIC AND ANAEROBIC Blood Culture adequate volume Performed at [facility] [address], [place], [place] [address]    Culture   Final    NO GROWTH 5 DAYS Performed at [facility], [place] [address] [place], [place] [address]    Report Status 08/11/2022 FINAL  Final  Resp panel by RT-PCR (RSV, Flu A&B, Covid) Anterior Nasal Swab     Status: None   Collection Time: 08/05/22  6:51 PM   Specimen: Anterior Nasal Swab  Result Value Ref Range Status   SARS Coronavirus 2 by RT PCR NEGATIVE NEGATIVE Final    Comment: (NOTE) SARS-CoV-2 target nucleic acids are NOT DETECTED.  The SARS-CoV-2 RNA is generally detectable in upper respiratory specimens during the acute phase of infection. The lowest concentration of SARS-CoV-2 viral copies this assay can detect is 138 copies/mL. A negative result does not preclude SARS-Cov-2 infection and should not be used as the sole basis for treatment or other patient management decisions. A negative result may occur with  improper specimen collection/handling, submission of specimen other than nasopharyngeal swab, presence of viral mutation(s) within the areas targeted by this assay, and inadequate number of viral copies(<138 copies/mL). A negative result must be combined with clinical observations, patient history, and epidemiological information. The expected result is Negative.  Fact Sheet for Patients:  EntrepreneurPulse.com.au  Fact Sheet for Healthcare Providers:  IncredibleEmployment.be  This test is no t yet approved or cleared by  the [place] FDA and  has been authorized for detection and/or diagnosis of SARS-CoV-2 by FDA under an Emergency Use Authorization (EUA). This EUA will remain  in effect (meaning this test can be used) for the duration of the COVID-19 declaration under Section 564(b)(1) of the  Act, 21 U.S.C.section 360bbb-3(b)(1), unless the authorization is terminated  or revoked sooner.       Influenza A by PCR NEGATIVE NEGATIVE Final   Influenza B by PCR NEGATIVE NEGATIVE Final    Comment: (NOTE) The Xpert Xpress SARS-CoV-2/FLU/RSV plus assay is intended as an aid in the diagnosis of influenza from Nasopharyngeal swab specimens and should not be used as a sole basis for treatment. Nasal washings and aspirates are unacceptable for Xpert Xpress SARS-CoV-2/FLU/RSV testing.  Fact Sheet for Patients: EntrepreneurPulse.com.au  Fact Sheet for Healthcare Providers: IncredibleEmployment.be  This test is not yet approved or cleared by the [place] FDA and has been authorized for detection and/or diagnosis of SARS-CoV-2 by FDA under an Emergency Use Authorization (EUA). This EUA will remain in effect (meaning this test can be used) for the duration of the COVID-19 declaration under Section 564(b)(1) of the Act, 21 U.S.C. section 360bbb-3(b)(1), unless the authorization is terminated or revoked.     Resp Syncytial Virus by PCR NEGATIVE NEGATIVE Final    Comment: (NOTE) Fact Sheet for Patients: EntrepreneurPulse.com.au  Fact Sheet for Healthcare Providers: IncredibleEmployment.be  This test is not yet approved or cleared by the [place] FDA and has been authorized for detection and/or diagnosis of SARS-CoV-2 by FDA under an Emergency Use Authorization (EUA). This EUA will remain in effect (meaning this test can be used) for the duration of the COVID-19 declaration under Section 564(b)(1) of the Act, 21  U.S.C. section 360bbb-3(b)(1), unless the authorization is terminated or revoked.  Performed at [facility], [place] [address] [place], [place] [address]   Respiratory (~20 pathogens) panel by PCR     Status: None   Collection Time: 08/05/22  6:51 PM   Specimen: Nasopharyngeal Swab; Respiratory  Result Value Ref Range Status   Adenovirus NOT DETECTED NOT DETECTED Final   Coronavirus 229E NOT DETECTED NOT DETECTED Final    Comment: (NOTE) The Coronavirus on the Respiratory Panel, DOES NOT test for the novel  Coronavirus (2019 nCoV)    Coronavirus HKU1 NOT DETECTED NOT DETECTED Final   Coronavirus NL63 NOT DETECTED NOT DETECTED Final   Coronavirus OC43 NOT DETECTED NOT DETECTED Final   Metapneumovirus NOT DETECTED NOT DETECTED Final   Rhinovirus / Enterovirus NOT DETECTED NOT DETECTED Final   Influenza A NOT DETECTED NOT DETECTED Final   Influenza B NOT DETECTED NOT DETECTED Final   Parainfluenza Virus 1 NOT DETECTED NOT DETECTED Final   Parainfluenza Virus 2 NOT DETECTED NOT DETECTED Final   Parainfluenza Virus 3 NOT DETECTED NOT DETECTED Final   Parainfluenza Virus 4 NOT DETECTED NOT DETECTED Final   Respiratory Syncytial Virus NOT DETECTED NOT DETECTED Final   Bordetella pertussis NOT DETECTED NOT DETECTED Final   Bordetella Parapertussis NOT DETECTED NOT DETECTED Final   Chlamydophila pneumoniae NOT DETECTED NOT DETECTED Final   Mycoplasma pneumoniae NOT DETECTED NOT DETECTED Final    Comment: Performed at [facility], [place]. [address] [place], [place] [address]  Culture, blood (routine x 2)     Status: None   Collection Time: 08/05/22  9:04 PM   Specimen: BLOOD RIGHT HAND  Result Value Ref Range Status   Specimen Description   Final    BLOOD RIGHT HAND Performed at [facility], [place] [address], [place], [place] [address]    Special Requests   Final    BOTTLES DRAWN AEROBIC ONLY Blood Culture adequate volume Performed at [facility]  [address], [place], [place] [address]    Culture   Final    NO GROWTH 5 DAYS Performed at [facility], [place] [address] [place], [place] [address]    Report Status 08/11/2022 FINAL  Final  MRSA Next Gen by PCR, Nasal     Status: None   Collection Time: 08/05/22  9:45 PM   Specimen: Nasal Mucosa; Nasal Swab  Result Value Ref Range Status   MRSA by PCR Next Gen NOT DETECTED NOT DETECTED Final    Comment: (NOTE) The GeneXpert MRSA Assay (FDA approved for NASAL specimens only), is one component of a comprehensive MRSA colonization surveillance program. It is not intended to diagnose MRSA infection nor to guide or monitor treatment for MRSA infections. Test performance is not FDA approved in patients less than 23 years old. Performed at [facility], [place] [address] [place], [place] [address]          Radiology Studies: No results found.      Scheduled Meds:  amLODipine  5 mg Oral Daily   arformoterol  15 mcg Nebulization BID   ARIPiprazole  12.5 mg Oral Daily   budesonide (PULMICORT) nebulizer solution  0.5 mg Nebulization BID   Chlorhexidine Gluconate Cloth  6 each Topical Daily   clotrimazole  1 Applicatorful Vaginal QHS   enoxaparin (LOVENOX) injection  70 mg Subcutaneous Q24H   FLUoxetine  40 mg Oral Daily   gabapentin  800 mg Oral TID   guaiFENesin  1,200 mg Oral BID   ipratropium  0.5 mg Nebulization BID   levalbuterol  0.63 mg Nebulization BID   lidocaine  1 patch Transdermal Daily   lisinopril  10 mg Oral Daily   pantoprazole  40 mg Oral Daily   traZODone  100 mg Oral QHS   Continuous Infusions:  sodium chloride Stopped (08/10/22 1114)     LOS: 8 days    Time spent: 35 minutes    Irine Seal, MD [facility]   To contact the attending provider between 7A-7P or the covering provider during after hours 7P-7A, please log into the web site [email or web address] and access using universal [facility] password for that web site. If you do not have the password, please call the hospital operator.  08/13/2022, 10:48 AM

## 2022-08-14 LAB — BASIC METABOLIC PANEL
Anion gap: 8 (ref 5–15)
BUN: 24 mg/dL — ABNORMAL HIGH (ref 6–20)
CO2: 25 mmol/L (ref 22–32)
Calcium: 8.9 mg/dL (ref 8.9–10.3)
Chloride: 103 mmol/L (ref 98–111)
Creatinine, Ser: 0.83 mg/dL (ref 0.44–1.00)
GFR, Estimated: >60 mL/min (ref >60)
Glucose, Bld: 148 mg/dL — ABNORMAL HIGH (ref 70–99)
Potassium: 4.5 mmol/L (ref 3.5–5.1)
Sodium: 136 mmol/L (ref 135–145)

## 2022-08-14 MED ORDER — KETOROLAC TROMETHAMINE 30 MG/ML IJ SOLN
30.0000 mg | Freq: Four times a day (QID) | INTRAMUSCULAR | Status: DC | PRN
  Administered 2022-08-14: 30 mg via INTRAVENOUS

## 2022-08-14 MED ORDER — TRAMADOL HCL 50 MG PO TABS
100.0000 mg | ORAL_TABLET | Freq: Four times a day (QID) | ORAL | Status: DC | PRN
  Administered 2022-08-14 – 2022-08-16 (×5): 100 mg via ORAL
  Filled 2022-08-14 (×5): qty 2

## 2022-08-14 NOTE — Progress Notes (Signed)
PROGRESS NOTE    Sarah Cervantes  [id number] DOB: December 25, 1980 DOA: 08/05/2022 PCP: Patient, No Pcp Per    Chief Complaint  Patient presents with   Shortness of Breath   Fall   Suicidal    Brief Narrative:  Sarah Cervantes is a 42 y.o. female with medical history significant for but not limited too polysubstance abuse including cocaine daily use, ethanol abuse, heroin abuse, history of alcohol abuse, COPD who supposed to be on 3 L supplemental oxygen chronically, previous suicide attempts, depression and anxiety as well as bipolar 1 disorder and other comorbidities who presents to the ED after becoming more short of breath and having a fall and having suicidal intent.  Of note history is taken from the chart and from the patient does she is extremely somnolent and falls asleep and becomes drowsy when speaking with her.  Reportedly the patient had a fall yesterday and has been more short of breath and has been having suicidal ideation.  After a fall she complained of left knee pain and neck pain status post fall.  Normally she is post ventilator and supplemental O2 via nasal cannula at baseline however has not used supplemental oxygen at least 3 days and this morning she took 4 Tylenol PMs and endorsed that "I just do not want to be here under more".  She presented for shortness of breath as well but she does endorse suicidal ideation with plans to overdose on pills.  She states that the shortness of breath got worse yesterday and she continues to be drowsy and falls asleep on my examination.  She recently was admitted to the inpatient psychiatric unit and presents with worsening shortness of breath.  She could not tell me if she is having chest pain or discomfort.  She been feeling poorly and "sick" for the last few days but did not elaborate on what she is feeling given her current condition.  Further evaluation and workup was done and she had a CT of the chest which showed "Small right pleural  effusion which appears partially loculated in the major fissure and lateral aspect of the right lower hemithorax. Multifocal ground-glass opacities throughout the bilateral lungs more prominent in the upper lobes. Findings are nonspecific and can be seen in the setting of multifocal pneumonia, pulmonary edema or hemorrhage. Mild  cardiomegaly. There are areas of atelectasis throughout the right lung with a small amount of airspace disease in the right middle lobe worrisome for infection."   Patient was placed on supplemental oxygen and continued be drowsy but did endorse suicidal ideation.  TRH was asked to admit this patient for acute on chronic hypoxic respiratory failure in the setting of multifocal pneumonia and partially loculated small pleural effusion as well as suicidal ideation with intent to overdose on pills.  Psychiatry was consulted for further evaluation given her suicidal attempt   **Interim History Patient was able to be weaned off of BiPAP for a little while and had to be placed on 6 L.  Continues to cough up quite a bit of productive sputum.  SLP has cleared the patient from their standpoint.  Psychiatry still recommends inpatient hospitalization.  Blood pressure remains elevated so she has been added back on her blood pressure medications and will avoid beta-blockers given her cocaine use.  Patient continued be somewhat anxious so her Atarax was also added back today.  Will need to continue monitor respiratory status and wean off of the BiPAP the 3 L of supplemental oxygen that  she is supposed to be wearing.   **She intermittently needs to go back on the BiPAP and was on it again the morning of 08/08/2022.  PT OT has been consulted patient initially refused however seen by PT OT today and no acute needs at this time.  Psychiatry recommends continuing current plan at this time and recommending to encourage the patient to participate in therapies with physical and Occupational Therapy given  that decreasing her immobilization will lead to deconditioning.   Assessment & Plan:   Principal Problem:   Multifocal pneumonia Active Problems:   Substance induced mood disorder (HCC)   Suicidal ideation   Hypokalemia   Acute metabolic encephalopathy   Essential hypertension   Gastroesophageal reflux disease   Morbid obesity with BMI of 50.0-59.9, adult (HCC)  #1 acute on chronic respiratory failure with hypoxia/history of obesity hypoventilation syndrome -Patient noted on admission and early on during the hospitalization to be somnolent and drowsy with increased respiratory rate initially breathing on 30 times a minute but somewhat improving. -Patient required intermittent BiPAP but currently on 3 L high flow nasal cannula. -Patient states was chronically on 3 L home O2. -CT chest done with small right pleural effusion appears partially loculated in the major fissure lateral aspect of the right lower hemithorax, multifocal groundglass opacities throughout bilateral lungs more prominent in upper lobes findings are nonspecific can be seen in multifocal pneumonia, pulmonary edema or hemorrhage.  Mild cardiomegaly.  Areas of atelectasis throughout the right lung with a small amount of airspace disease in the right middle lobe worrisome for infection.  Follow-up imaging recommended 3 to 6 weeks to confirm resolution. -BNP noted at 49.6 on admission. -Respiratory viral panel negative. -MRSA PCR negative. -SARS coronavirus 2 PCR negative, influenza A and B PCR negative, RSV PCR negative. -ABG with a pH of 7.41, pO2 of 59, bicarb of 29, O2 sats of 92.4. -Patient with a urine output of 1.1 L recorded over the past 24 hours.  -Patient status post IV Lasix x 2 doses 08/09/2022.   -Urine pneumococcus antigen negative, urine Legionella antigen negative. -Status post 5 days IV Rocephin and IV azithromycin.  -Pulmonary toileting. -Continue Mucinex, scheduled nebs, Brovana, Pulmicort, PPI. -BiPAP  noted to have been required early on in the hospitalization however currently has been discontinued. - CPAP QHS as patient also noted with OSA.  2.  Acute metabolic encephalopathy -Noted early on in the hospitalization, with some intermittent somnolence and drowsiness could likely be in the setting of problem #1. -Per prior physician patient noted to have taken 4 Tylenol PMs on killing etiology what else patient took. -UDS done was positive for cocaine, amphetamines, THC. -Salicylate level < 7. -Alcohol level < 10 -EKG with no ischemic changes.  No QT prolongation. -Patient with clinical improvement and likely close to baseline.. -Status post 5 days IV antibiotics.   3.  Hypokalemia -Repleted.  Potassium at 4.3.  4.  Suicidal ideation/history of bipolar 1 disorder -Patient noted to be somnolent and drowsy early on during the hospitalization however endorsed suicidal ideation and requiring one-to-one sitter. -Patient initially noted to have been involuntarily committed, psychiatry consulted and recommended inpatient psychiatric hospitalization once medically stable. -Atarax resumed for anxiety. -Patient seen by psychiatry and patient willing to voluntary inpatient admission, resumption of home medications recommended by psychiatry and patient placed back on Abilify, gabapentin. -Prozac and trazodone resumed 08/11/2022 per psychiatry. -Per psychiatry.  5.  Hypertension -Norvasc, lisinopril.   6.  GERD -PPI.  7.  Thrombocytosis -  Reactive. -Improved.    8.  Super morbid obesity/OHS -BMI 53.89 -Lifestyle modification. -Outpatient follow-up with PCP.  93.  Fall -PT/ OT ordered, and working with patient.  10.  Polysubstance abuse -Patient noted with history of heroin abuse, cocaine use. -Alcohol level noted at < 10. -UDS done positive for cocaine, amphetamines, THC. -Patient was placed on the clonidine detox protocol.   11.  OSA -CPAP nightly.  -Will need outpatient follow-up  with pulmonary for further evaluation of a OSA and to be set up for CPAP machine in the outpatient setting.  12.  Tooth pain -Lidocaine swish and swallow, oral gel as needed. -Will need outpatient follow-up with dentistry.   DVT prophylaxis: Lovenox Code Status: Full Family Communication: Updated patient.  No family at bedside. Disposition: Patient medically stable.  Inpatient psychiatry hopefully in the next 24 hours.   Status is: Inpatient Remains inpatient appropriate because: Severity of illness   Consultants:  Psychiatry: Dr. Dwyane Dee 08/07/2022  Procedures:  CT chest 08/05/2022 Chest x-ray 08/06/2022, 08/07/2022, 08/08/2022, 08/09/2022  Antimicrobials: IV azithromycin 08/05/2022>>>> 08/09/2022 IV Rocephin 08/05/2022>>>> 08/10/2022   Subjective: Laying in bed.  No chest pain.  No shortness of breath.  No abdominal pain.  Asking whether she can get a regular diet.  Tolerating CPAP at bedtime.   Objective: Vitals:   08/13/22 1942 08/13/22 1945 08/13/22 2118 08/13/22 2119  BP:   118/63   Pulse:   76 78  Resp:   19   Temp:   (!) 97.4 F (36.3 C)   TempSrc:   Oral   SpO2: 96% 96% (!) 89% 90%  Weight:      Height:        Intake/Output Summary (Last 24 hours) at 08/14/2022 1011 Last data filed at 08/14/2022 0900 Gross per 24 hour  Intake 1079 ml  Output --  Net 1079 ml    Filed Weights   08/11/22 0500 08/12/22 0151 08/13/22 0703  Weight: (!) 143.2 kg (!) 145.5 kg (!) 146.4 kg    Examination:  General exam: Awake.  Laying in bed. Respiratory system: Lungs clear to auscultation bilaterally.  No wheezes, no crackles, no rhonchi.  Fair air movement.  Speaking in full sentences.   Cardiovascular system: RRR no murmurs rubs or gallops.  No JVD.  No lower extremity edema.  Gastrointestinal system: Abdomen is soft, obese, nontender, nondistended, positive bowel sounds.  No rebound.  No guarding.  Central nervous system: Alert and oriented. No focal neurological  deficits. Extremities: Symmetric 5 x 5 power. Skin: No rashes, lesions or ulcers Psychiatry: Judgement and insight appear poor to fair. Mood & affect flat.     Data Reviewed: I have personally reviewed following labs and imaging studies  CBC: Recent Labs  Lab 08/08/22 0255 08/09/22 0314 08/10/22 0313 08/11/22 0330 08/12/22 0917  WBC 8.8 9.5 8.1 7.9 7.6  NEUTROABS 5.1 5.3 4.2 4.3  --   HGB 13.8 14.5 13.6 13.2 13.0  HCT 44.3 46.3* 44.0 42.1 42.1  MCV 92.7 93.2 94.4 93.8 93.8  PLT 377 360 335 308 330     Basic Metabolic Panel: Recent Labs  Lab 08/07/22 1040 08/08/22 0255 08/09/22 0314 08/10/22 0313 08/11/22 0330 08/12/22 0917 08/13/22 0311  NA 137 136 136 135 134* 134* 136  K 3.9 4.0 4.6 4.5 4.9 4.7 4.3  CL 102 102 101 99 100 101 101  CO2 23 25 26 27 26 28 27   GLUCOSE 129* 133* 112* 106* 102* 131* 103*  BUN 12 15  15 24* 29* 23* 25*  CREATININE 0.52 0.70 0.82 0.93 0.72 0.79 0.81  CALCIUM 8.4* 8.6* 8.8* 8.6* 8.7* 8.9 8.5*  MG 2.1 2.2 2.3 2.5* 2.2  --  2.1  PHOS 3.2 5.1* 3.4  --   --   --   --      GFR: Estimated Creatinine Clearance: 129.2 mL/min (by C-G formula based on SCr of 0.81 mg/dL).  Liver Function Tests: Recent Labs  Lab 08/07/22 1040 08/08/22 0255 08/09/22 0314 08/10/22 0313 08/12/22 0917  AST 11* 12* 15 14* 12*  ALT 13 14 13 13 12   ALKPHOS 55 51 55 50 52  BILITOT 0.4 0.4 0.6 0.7 0.4  PROT 7.6 7.4 7.4 7.1 7.0  ALBUMIN 3.2* 3.2* 3.2* 3.2* 3.3*     CBG: Recent Labs  Lab 08/08/22 0744 08/09/22 0732 08/10/22 0819 08/11/22 0809 08/12/22 0757  GLUCAP 136* 114* 124* 110* 110*      Recent Results (from the past 240 hour(s))  Culture, blood (routine x 2)     Status: None   Collection Time: 08/05/22  4:35 PM   Specimen: BLOOD RIGHT FOREARM  Result Value Ref Range Status   Specimen Description   Final    BLOOD RIGHT FOREARM Performed at [facility] [address], [place], [place] [address]    Special Requests   Final     BOTTLES DRAWN AEROBIC AND ANAEROBIC Blood Culture adequate volume Performed at [facility] [address] [place], [place] [address]    Culture   Final    NO GROWTH 5 DAYS Performed at [facility], [place] [address] [place], [place] [address]    Report Status 08/11/2022 FINAL  Final  Resp panel by RT-PCR (RSV, Flu A&B, Covid) Anterior Nasal Swab     Status: None   Collection Time: 08/05/22  6:51 PM   Specimen: Anterior Nasal Swab  Result Value Ref Range Status   SARS Coronavirus 2 by RT PCR NEGATIVE NEGATIVE Final    Comment: (NOTE) SARS-CoV-2 target nucleic acids are NOT DETECTED.  The SARS-CoV-2 RNA is generally detectable in upper respiratory specimens during the acute phase of infection. The lowest concentration of SARS-CoV-2 viral copies this assay can detect is 138 copies/mL. A negative result does not preclude SARS-Cov-2 infection and should not be used as the sole basis for treatment or other patient management decisions. A negative result may occur with  improper specimen collection/handling, submission of specimen other than nasopharyngeal swab, presence of viral mutation(s) within the areas targeted by this assay, and inadequate number of viral copies(<138 copies/mL). A negative result must be combined with clinical observations, patient history, and epidemiological information. The expected result is Negative.  Fact Sheet for Patients:  EntrepreneurPulse.com.au  Fact Sheet for Healthcare Providers:  IncredibleEmployment.be  This test is no t yet approved or cleared by the [place] FDA and  has been authorized for detection and/or diagnosis of SARS-CoV-2 by FDA under an Emergency Use Authorization (EUA). This EUA will remain  in effect (meaning this test can be used) for the duration of the COVID-19 declaration under Section 564(b)(1) of the Act, 21 U.S.C.section 360bbb-3(b)(1), unless the  authorization is terminated  or revoked sooner.       Influenza A by PCR NEGATIVE NEGATIVE Final   Influenza B by PCR NEGATIVE NEGATIVE Final    Comment: (NOTE) The Xpert Xpress SARS-CoV-2/FLU/RSV plus assay is intended as an aid in the diagnosis of influenza from Nasopharyngeal swab  specimens and should not be used as a sole basis for treatment. Nasal washings and aspirates are unacceptable for Xpert Xpress SARS-CoV-2/FLU/RSV testing.  Fact Sheet for Patients: EntrepreneurPulse.com.au  Fact Sheet for Healthcare Providers: IncredibleEmployment.be  This test is not yet approved or cleared by the [place] FDA and has been authorized for detection and/or diagnosis of SARS-CoV-2 by FDA under an Emergency Use Authorization (EUA). This EUA will remain in effect (meaning this test can be used) for the duration of the COVID-19 declaration under Section 564(b)(1) of the Act, 21 U.S.C. section 360bbb-3(b)(1), unless the authorization is terminated or revoked.     Resp Syncytial Virus by PCR NEGATIVE NEGATIVE Final    Comment: (NOTE) Fact Sheet for Patients: EntrepreneurPulse.com.au  Fact Sheet for Healthcare Providers: IncredibleEmployment.be  This test is not yet approved or cleared by the [place] FDA and has been authorized for detection and/or diagnosis of SARS-CoV-2 by FDA under an Emergency Use Authorization (EUA). This EUA will remain in effect (meaning this test can be used) for the duration of the COVID-19 declaration under Section 564(b)(1) of the Act, 21 U.S.C. section 360bbb-3(b)(1), unless the authorization is terminated or revoked.  Performed at [facility], [place] [address], [place], [place] [address]   Respiratory (~20 pathogens) panel by PCR     Status: None   Collection Time: 08/05/22  6:51 PM   Specimen: Nasopharyngeal Swab; Respiratory  Result Value Ref Range  Status   Adenovirus NOT DETECTED NOT DETECTED Final   Coronavirus 229E NOT DETECTED NOT DETECTED Final    Comment: (NOTE) The Coronavirus on the Respiratory Panel, DOES NOT test for the novel  Coronavirus (2019 nCoV)    Coronavirus HKU1 NOT DETECTED NOT DETECTED Final   Coronavirus NL63 NOT DETECTED NOT DETECTED Final   Coronavirus OC43 NOT DETECTED NOT DETECTED Final   Metapneumovirus NOT DETECTED NOT DETECTED Final   Rhinovirus / Enterovirus NOT DETECTED NOT DETECTED Final   Influenza A NOT DETECTED NOT DETECTED Final   Influenza B NOT DETECTED NOT DETECTED Final   Parainfluenza Virus 1 NOT DETECTED NOT DETECTED Final   Parainfluenza Virus 2 NOT DETECTED NOT DETECTED Final   Parainfluenza Virus 3 NOT DETECTED NOT DETECTED Final   Parainfluenza Virus 4 NOT DETECTED NOT DETECTED Final   Respiratory Syncytial Virus NOT DETECTED NOT DETECTED Final   Bordetella pertussis NOT DETECTED NOT DETECTED Final   Bordetella Parapertussis NOT DETECTED NOT DETECTED Final   Chlamydophila pneumoniae NOT DETECTED NOT DETECTED Final   Mycoplasma pneumoniae NOT DETECTED NOT DETECTED Final    Comment: Performed at [facility], [place], [place], [place] [address]  Culture, blood (routine x 2)     Status: None   Collection Time: 08/05/22  9:04 PM   Specimen: BLOOD RIGHT HAND  Result Value Ref Range Status   Specimen Description   Final    BLOOD RIGHT HAND Performed at [facility], [place] [address] [place], [place] [address]    Special Requests   Final    BOTTLES DRAWN AEROBIC ONLY Blood Culture adequate volume Performed at [facility] [address] [place], [place] [address]    Culture   Final    NO GROWTH 5 DAYS Performed at [facility], [place] [address] [place], [place] [address]    Report Status 08/11/2022 FINAL  Final  MRSA Next Gen by PCR, Nasal     Status: None   Collection Time: 08/05/22  9:45 PM   Specimen: Nasal Mucosa; Nasal Swab   Result Value Ref Range Status   MRSA by PCR Next Gen NOT DETECTED NOT DETECTED Final    Comment: (NOTE) The GeneXpert MRSA Assay (FDA approved for NASAL specimens only), is one component of a comprehensive MRSA colonization surveillance program. It is not intended to diagnose MRSA infection nor to guide or monitor treatment for MRSA infections. Test performance is not FDA approved in patients less than 15 years old. Performed at [facility], [place] [address] [place], [place] [address]          Radiology Studies: No results found.      Scheduled Meds:  amLODipine  5 mg Oral Daily   arformoterol  15 mcg Nebulization BID   ARIPiprazole  12.5 mg Oral Daily   budesonide (PULMICORT) nebulizer solution  0.5 mg Nebulization BID   Chlorhexidine Gluconate Cloth  6 each Topical Daily   clotrimazole  1 Applicatorful Vaginal QHS   enoxaparin (LOVENOX) injection  70 mg Subcutaneous Q24H   FLUoxetine  40 mg Oral Daily   gabapentin  800 mg Oral TID   guaiFENesin  1,200 mg Oral BID   ipratropium  0.5 mg Nebulization BID   levalbuterol  0.63 mg Nebulization BID   lidocaine  1 patch Transdermal Daily   lisinopril  10 mg Oral Daily   pantoprazole  40 mg Oral Daily   traZODone  100 mg Oral QHS   Continuous Infusions:  sodium chloride Stopped (08/10/22 1114)     LOS: 9 days    Time spent: 35 minutes    Irine Seal, MD [facility]   To contact the attending provider between 7A-7P or the covering provider during after hours 7P-7A, please log into the web site [email or web address] and access using universal [place] password for that web site. If you do not have the password, please call the hospital operator.  08/14/2022, 10:11 AM

## 2022-08-14 NOTE — TOC Progression Note (Addendum)
Transition of Care [facility]) - Progression Note   Patient Details  Name: Sarah Cervantes MRN: [id number] Date of Birth: 04/20/1981  Transition of [facility] CM/SW [place], LCSW Phone Number: 08/14/2022, 10:43 AM  Clinical Narrative: CSW called [facility] [phone]) to follow up with IP referral that was faxed last week. CSW spoke with triage and confirmed the referral was received, but it had either not have been reviewed yet or was still being reviewed.  CSW received call back from [place] with [facility] and was informed the patient is out of the catchment area. CSW faxed referral to [facility] [phone]) for review. CSW also provided copy of residential treatment facilities to patient so she can also reach out regarding a bed.  Additional referrals faxed to [facility] in [place] [phone]) and [facility] [phone]).  Food pantry information added to AVS.  Barriers to Discharge: Continued Medical Work up, Psych Bed not available  Expected Discharge Plan and Services In-house Referral: NA Discharge Planning Services: CM Consult Living arrangements for the past 2 months: Hotel/Motel  Social Determinants of Health (SDOH) Interventions SDOH Screenings   Food Insecurity: Food Insecurity Present (08/06/2022)  Housing: Medium Risk (08/06/2022)  Transportation Needs: No Transportation Needs (08/06/2022)  Utilities: Not At Risk (08/06/2022)  Alcohol Screen: Medium Risk (04/10/2018)  Tobacco Use: High Risk (08/05/2022)   Readmission Risk Interventions     No data to display
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
## 2022-08-14 NOTE — Plan of Care (Signed)
°  Problem: Coping: °Goal: Level of anxiety will decrease °Outcome: Progressing °  °

## 2022-08-14 NOTE — Progress Notes (Signed)
Physical Therapy Treatment Patient Details Name: Sarah Cervantes MRN: [id number] DOB: 1980-11-17 Today's Date: 08/14/2022   History of Present Illness Merion Wissing is a 42 y.o. female presents to the ED after becoming more short of breath and having a fall and having suicidal intent. Found to have acute on chronic hypoxic respiratory failure in the setting of multifocal pneumonia and partially loculated small pleural effusion. PHMx: significant for but not limited too polysubstance abuse including cocaine daily use, ethanol abuse, heroin abuse, history of alcohol abuse, COPD who supposed to be on 3 L supplemental oxygen chronically, previous suicide attempts, depression and anxiety as well as bipolar 1 disorder. Was here 2 weeks ago s/p fall wtih questionable ligamentuous injury to knee (placed in [place]) and suicidal ideations with D/C to inpatient psych.    PT Comments    Pt pleasant and very agreeable to mobilize.  Pt ambulated in hallway with wide RW and required several short standing rest breaks due to dyspnea and fatigue.  SpO2 91-93% on room air upon returning to room.    Recommendations for follow up therapy are one component of a multi-disciplinary discharge planning process, led by the attending physician.  Recommendations may be updated based on patient status, additional functional criteria and insurance authorization.  Follow Up Recommendations  No PT follow up     Assistance Recommended at Discharge PRN  Patient can return home with the following Assistance with cooking/housework;Assist for transportation;Help with stairs or ramp for entrance   Equipment Recommendations  Rolling walker (2 wheels)    Recommendations for Other Services       Precautions / Restrictions Precautions Precautions: Fall Precaution Comments: left knee can buckle Restrictions Weight Bearing Restrictions: No     Mobility  Bed Mobility Overal bed mobility: Modified Independent                   Transfers Overall transfer level: Modified independent Equipment used: Rolling walker (2 wheels)                    Ambulation/Gait Ambulation/Gait assistance: Supervision Gait Distance (Feet): 160 Feet Assistive device: Rolling walker (2 wheels) Gait Pattern/deviations: Step-through pattern, Decreased stride length Gait velocity: decreased     General Gait Details: provided wide RW, pt using RW appropriately, required multiple short standing rest breaks due to dyspnea and fatigue however SPO2 91-93% on room air upon returning to room   Stairs             Wheelchair Mobility    Modified Rankin (Stroke Patients Only)       Balance                                            Cognition Arousal/Alertness: Awake/alert Behavior During Therapy: WFL for tasks assessed/performed Overall Cognitive Status: Within Functional Limits for tasks assessed                                          Exercises      General Comments        Pertinent Vitals/Pain Pain Assessment Pain Assessment: Faces Faces Pain Scale: Hurts a little bit Pain Location: L knee Pain Descriptors / Indicators: Sore Pain Intervention(s): Monitored during session    Home Living  Prior Function            PT Goals (current goals can now be found in the care plan section) Progress towards PT goals: Progressing toward goals    Frequency    Min 3X/week      PT Plan Current plan remains appropriate    Co-evaluation              AM-PAC PT "6 Clicks" Mobility   Outcome Measure  Help needed turning from your back to your side while in a flat bed without using bedrails?: None Help needed moving from lying on your back to sitting on the side of a flat bed without using bedrails?: None Help needed moving to and from a bed to a chair (including a wheelchair)?: None Help needed standing up from a  chair using your arms (e.g., wheelchair or bedside chair)?: None Help needed to walk in hospital room?: A Little Help needed climbing 3-5 steps with a railing? : A Little 6 Click Score: 22    End of Session   Activity Tolerance: Patient tolerated treatment well Patient left: in bed;with call bell/phone within reach   PT Visit Diagnosis: Other abnormalities of gait and mobility (R26.89)     Time: 1142-1153 PT Time Calculation (min) (ACUTE ONLY): 11 min  Charges:  $Gait Training: 8-22 mins                    Arlyce Dice, DPT Physical Therapist Acute Rehabilitation Services Preferred contact method: Secure Chat Weekend Pager Only: [phone] Office: [place] 08/14/2022, 1:05 PM

## 2022-08-15 MED ORDER — AMLODIPINE BESYLATE 5 MG PO TABS
5.0000 mg | ORAL_TABLET | Freq: Every day | ORAL | Status: DC
  Administered 2022-08-15 – 2022-08-16 (×2): 5 mg via ORAL
  Filled 2022-08-15 (×2): qty 1

## 2022-08-15 MED ORDER — SENNA 8.6 MG PO TABS
1.0000 | ORAL_TABLET | Freq: Every day | ORAL | Status: DC
  Administered 2022-08-15 – 2022-08-16 (×2): 8.6 mg via ORAL
  Filled 2022-08-15 (×2): qty 1

## 2022-08-15 MED ORDER — LISINOPRIL 10 MG PO TABS
10.0000 mg | ORAL_TABLET | Freq: Every day | ORAL | Status: DC
  Administered 2022-08-15 – 2022-08-16 (×2): 10 mg via ORAL
  Filled 2022-08-15 (×2): qty 1

## 2022-08-15 NOTE — Plan of Care (Signed)
  Problem: Activity: Goal: Risk for activity intolerance will decrease Outcome: Progressing   Problem: Coping: Goal: Level of anxiety will decrease Outcome: Progressing   Problem: Pain Managment: Goal: General experience of comfort will improve Outcome: Progressing   Problem: Safety: Goal: Ability to remain free from injury will improve Outcome: Progressing   

## 2022-08-15 NOTE — Progress Notes (Signed)
Mobility Specialist - Progress Note   08/15/22 1033  Mobility  Activity Ambulated independently in hallway  Level of Assistance Contact guard assist, steadying assist  Assistive Device None  Distance Ambulated (ft) 100 ft  Activity Response Tolerated well  Mobility Referral Yes  $Mobility charge 1 Mobility   Pt received in bed and agreeable to mobility. During ambulation, pt took 3x standing rest breaks due to SOB. Encouraged pursed lip breaths during standing rest breaks. Pt to bed after session with all needs met.    [facility]

## 2022-08-15 NOTE — Consult Note (Signed)
Patient does not currently present with active symptoms of an acute manic or psychotic episode and she is denying SI/HI this morning. Patient does not meet involuntary commitment criteria at this time. Combination of personality structure and chronic substance abuse leading to impulsivity and mood dysregulation increase overall risk of self-harm. However, at this time, her mood is stable, the patient is future oriented. There is no indication of dangerousness to self/others. Will recommend psychiatrically clear and patient and assisting with door to door inpatient substance abuse rehabilitation facility when medically stable.   She is alert and oriented x 4.  Her physical appearance is casual and appropriate for hospital environment.  She is calm, cooperative, communicated well with normal rate and normal volume.   Psychiatric nurse practitioner briefly reassess patient and checking for updates. Patient was observed to be conducting a phone call with an agency, confirming her insurance. She reports that she has insurance and was declined at [facility]. She states she is actively working on securing inpatient rehabilitation at [facility] and [facility]. She remains motivated to seek inpatient rehab treatment. As such she has failed outpatient attempts to enroll in treatment services. Will recommend door to door transfer at this time as patient is very HIGH risk for readmission within 30 days. In addition to displaying inability to make appropriate decisions in the community as she continues to struggle with her addiction and will likely attempt suicide as she had on numerous occasions before when under the influence.     Psychiatry will continue to follow from a distance.  -Will continue to work with patient and [facility] team for inpatient rehab.

## 2022-08-15 NOTE — Progress Notes (Signed)
Physical Therapy Treatment and Discharge Patient Details Name: Kanylah Conry MRN: [id number] DOB: [birth year] Today's Date: 08/15/2022   History of Present Illness Yomna Haveman is a 42 y.o. female presents to the ED after becoming more short of breath and having a fall and having suicidal intent. Found to have acute on chronic hypoxic respiratory failure in the setting of multifocal pneumonia and partially loculated small pleural effusion. PHMx: significant for but not limited too polysubstance abuse including cocaine daily use, ethanol abuse, heroin abuse, history of alcohol abuse, COPD who supposed to be on 3 L supplemental oxygen chronically, previous suicide attempts, depression and anxiety as well as bipolar 1 disorder. Was here 2 weeks ago s/p fall wtih questionable ligamentuous injury to knee (placed in [place]) and suicidal ideations with D/C to inpatient psych.    PT Comments    Pt progressing well, meeting goals; SpO2=93-95% on RA. Incr amb distance tol well. No further PT needs  at this time  Recommendations for follow up therapy are one component of a multi-disciplinary discharge planning process, led by the attending physician.  Recommendations may be updated based on patient status, additional functional criteria and insurance authorization.  Follow Up Recommendations  No PT follow up Can patient physically be transported by private vehicle: Yes   Assistance Recommended at Discharge PRN  Patient can return home with the following Assistance with cooking/housework;Assist for transportation;Help with stairs or ramp for entrance   Equipment Recommendations  None recommended by PT    Recommendations for Other Services       Precautions / Restrictions Precautions Precautions: Fall Precaution Comments: left knee can buckle Restrictions Weight Bearing Restrictions: No Other Position/Activity Restrictions: WBAT     Mobility  Bed Mobility Overal bed mobility: Modified  Independent Bed Mobility: Supine to Sit                Transfers Overall transfer level: Independent Equipment used: None Transfers: Sit to/from [employer] transfer comment: no physical assist    Ambulation/Gait Ambulation/Gait assistance: Modified independent (Device/Increase time) Gait Distance (Feet): 300 Feet Assistive device: None Gait Pattern/deviations: Step-through pattern, Decreased stride length, Wide base of support Gait velocity: decreased     General Gait Details: no device, no overt LOB; occaisonal rail touch with fatigue. 3 standing rests;  SpO2=93-95% on RA   Stairs             Wheelchair Mobility    Modified Rankin (Stroke Patients Only)       Balance                                            Cognition Arousal/Alertness: Awake/alert Behavior During Therapy: WFL for tasks assessed/performed Overall Cognitive Status: Within Functional Limits for tasks assessed                                 General Comments: wants  to walk withoout O2 as she cannot have it at [facility]        Exercises      General Comments        Pertinent Vitals/Pain Pain Assessment Pain Assessment: No/denies pain Pain Location:  (occasional knee pain)    Home Living  Prior Function            PT Goals (current goals can now be found in the care plan section) Acute Rehab PT Goals Patient Stated Goal: Regain IND PT Goal Formulation: With patient Time For Goal Achievement: 08/23/22 Potential to Achieve Goals: Good Progress towards PT goals: Progressing toward goals    Frequency    Min 3X/week      PT Plan Current plan remains appropriate    Co-evaluation              AM-PAC PT "6 Clicks" Mobility   Outcome Measure  Help needed turning from your back to your side while in a flat bed without using bedrails?: None Help needed moving from lying on your  back to sitting on the side of a flat bed without using bedrails?: None Help needed moving to and from a bed to a chair (including a wheelchair)?: None Help needed standing up from a chair using your arms (e.g., wheelchair or bedside chair)?: None Help needed to walk in hospital room?: None Help needed climbing 3-5 steps with a railing? : A Little 6 Click Score: 23    End of Session   Activity Tolerance: Patient tolerated treatment well Patient left: in bed;with call bell/phone within reach   PT Visit Diagnosis: Other abnormalities of gait and mobility (R26.89)     Time: [id number] PT Time Calculation (min) (ACUTE ONLY): 14 min  Charges:  $Gait Training: 8-22 mins                     Baxter Flattery, PT  Acute Rehab Dept [facility]) [phone]  [facility] Weekend Pager (Saturday/Sunday only)  [phone]  08/15/2022    [facility] 08/15/2022, 3:10 PM

## 2022-08-15 NOTE — Progress Notes (Signed)
PROGRESS NOTE    Sarah Cervantes  [id number] DOB: December 25, 1980 DOA: 08/05/2022 PCP: Patient, No Pcp Per    Chief Complaint  Patient presents with   Shortness of Breath   Fall   Suicidal    Brief Narrative:  Sarah Cervantes is a 42 y.o. female with medical history significant for but not limited too polysubstance abuse including cocaine daily use, ethanol abuse, heroin abuse, history of alcohol abuse, COPD who supposed to be on 3 L supplemental oxygen chronically, previous suicide attempts, depression and anxiety as well as bipolar 1 disorder and other comorbidities who presents to the ED after becoming more short of breath and having a fall and having suicidal intent.  Of note history is taken from the chart and from the patient does she is extremely somnolent and falls asleep and becomes drowsy when speaking with her.  Reportedly the patient had a fall yesterday and has been more short of breath and has been having suicidal ideation.  After a fall she complained of left knee pain and neck pain status post fall.  Normally she is post ventilator and supplemental O2 via nasal cannula at baseline however has not used supplemental oxygen at least 3 days and this morning she took 4 Tylenol PMs and endorsed that "I just do not want to be here under more".  She presented for shortness of breath as well but she does endorse suicidal ideation with plans to overdose on pills.  She states that the shortness of breath got worse yesterday and she continues to be drowsy and falls asleep on my examination.  She recently was admitted to the inpatient psychiatric unit and presents with worsening shortness of breath.  She could not tell me if she is having chest pain or discomfort.  She been feeling poorly and "sick" for the last few days but did not elaborate on what she is feeling given her current condition.  Further evaluation and workup was done and she had a CT of the chest which showed "Small right pleural  effusion which appears partially loculated in the major fissure and lateral aspect of the right lower hemithorax. Multifocal ground-glass opacities throughout the bilateral lungs more prominent in the upper lobes. Findings are nonspecific and can be seen in the setting of multifocal pneumonia, pulmonary edema or hemorrhage. Mild  cardiomegaly. There are areas of atelectasis throughout the right lung with a small amount of airspace disease in the right middle lobe worrisome for infection."   Patient was placed on supplemental oxygen and continued be drowsy but did endorse suicidal ideation.  TRH was asked to admit this patient for acute on chronic hypoxic respiratory failure in the setting of multifocal pneumonia and partially loculated small pleural effusion as well as suicidal ideation with intent to overdose on pills.  Psychiatry was consulted for further evaluation given her suicidal attempt   **Interim History Patient was able to be weaned off of BiPAP for a little while and had to be placed on 6 L.  Continues to cough up quite a bit of productive sputum.  SLP has cleared the patient from their standpoint.  Psychiatry still recommends inpatient hospitalization.  Blood pressure remains elevated so she has been added back on her blood pressure medications and will avoid beta-blockers given her cocaine use.  Patient continued be somewhat anxious so her Atarax was also added back today.  Will need to continue monitor respiratory status and wean off of the BiPAP the 3 L of supplemental oxygen that  she is supposed to be wearing.   **She intermittently needs to go back on the BiPAP and was on it again the morning of 08/08/2022.  PT OT has been consulted patient initially refused however seen by PT OT today and no acute needs at this time.  Psychiatry recommends continuing current plan at this time and recommending to encourage the patient to participate in therapies with physical and Occupational Therapy given  that decreasing her immobilization will lead to deconditioning.   Assessment & Plan:   Principal Problem:   Multifocal pneumonia Active Problems:   Substance induced mood disorder (HCC)   Suicidal ideation   Hypokalemia   Acute metabolic encephalopathy   Essential hypertension   Gastroesophageal reflux disease   Morbid obesity with BMI of 50.0-59.9, adult (HCC)  #1 acute on chronic respiratory failure with hypoxia/history of obesity hypoventilation syndrome -Patient noted on admission and early on during the hospitalization to be somnolent and drowsy with increased respiratory rate initially breathing on 30 times a minute but somewhat improving. -Patient required intermittent BiPAP but currently on room air with sats of 93%.  -Patient states was chronically on 3 L home O2. -CT chest done with small right pleural effusion appears partially loculated in the major fissure lateral aspect of the right lower hemithorax, multifocal groundglass opacities throughout bilateral lungs more prominent in upper lobes findings are nonspecific can be seen in multifocal pneumonia, pulmonary edema or hemorrhage.  Mild cardiomegaly.  Areas of atelectasis throughout the right lung with a small amount of airspace disease in the right middle lobe worrisome for infection.  Follow-up imaging recommended 3 to 6 weeks to confirm resolution. -BNP noted at 49.6 on admission. -Respiratory viral panel negative. -MRSA PCR negative. -SARS coronavirus 2 PCR negative, influenza A and B PCR negative, RSV PCR negative. -ABG with a pH of 7.41, pO2 of 59, bicarb of 29, O2 sats of 92.4. -Patient status post IV Lasix x 2 doses 08/09/2022.   -Urine pneumococcus antigen negative, urine Legionella antigen negative. -Status post 5 days IV Rocephin and IV azithromycin.  -Pulmonary toileting. -Continue Mucinex, scheduled nebs, Brovana, Pulmicort, PPI. -BiPAP noted to have been required early on in the hospitalization however  currently has been discontinued. - CPAP QHS as patient also noted with OSA.  2.  Acute metabolic encephalopathy -Noted early on in the hospitalization, with some intermittent somnolence and drowsiness could likely be in the setting of problem #1. -Per prior physician patient noted to have taken 4 Tylenol PMs on killing etiology what else patient took. -UDS done was positive for cocaine, amphetamines, THC. -Salicylate level < 7. -Alcohol level < 10 -EKG with no ischemic changes.  No QT prolongation. -Patient with clinical improvement and likely close to baseline.. -Status post 5 days IV antibiotics.   3.  Hypokalemia -Repleted.    4.  Suicidal ideation/history of bipolar 1 disorder -Patient noted to be somnolent and drowsy early on during the hospitalization however endorsed suicidal ideation and requiring one-to-one sitter. -Patient initially noted to have been involuntarily committed, psychiatry consulted and initially recommended inpatient psychiatric hospitalization once medically stable. -Atarax resumed for anxiety. -Patient seen by psychiatry and patient willing to voluntary inpatient admission, resumption of home medications recommended by psychiatry and patient placed back on Abilify, gabapentin. -Prozac and trazodone resumed 08/11/2022 per psychiatry. -Patient reassessed by psychiatry and per their note patient with no active symptoms of acute mania or psychotic episode and patient denied to psychiatry any suicidal or homicidal ideation this morning. -Per psychiatry  at this time patient does not meet involuntary commitment criteria at this time.  Psychiatry feels patient's mood is currently stable and no indication of dangerousness to self or others and patient psychiatrically cleared and psychiatry recommending and patient assisting with door to door inpatient substance abuse rehabilitation facility once medically stable. -Patient currently medically stable and psychiatry recommending  door-to-door transfer as patient very high risk for readmission within the next 30 days. -Per psychiatry. -TOC consulted to assist with placing patient in inpatient rehab.  5.  Hypertension -BP noted to be low/soft this morning and as such Norvasc and lisinopril were held.  -BP has improved throughout the day and as such we will resume home regimen Norvasc, lisinopril.   6.  GERD -Continue PPI.   7.  Thrombocytosis -Reactive. -Improved.    8.  Super morbid obesity/OHS -BMI 53.89 -Lifestyle modification. -Outpatient follow-up with PCP.  81.  Fall -PT/ OT ordered, and working with patient.  10.  Polysubstance abuse -Patient noted with history of heroin abuse, cocaine use. -Alcohol level noted at < 10. -UDS done positive for cocaine, amphetamines, THC. -Patient was placed on the clonidine detox protocol.  -Awaiting to go to inpatient rehab from this hospitalization.  11.  OSA -CPAP nightly.  -Will need outpatient follow-up with pulmonary for further evaluation of a OSA and to be set up for CPAP machine in the outpatient setting.  12.  Tooth pain -Lidocaine swish and swallow, oral gel as needed. -Will need outpatient follow-up with dentistry.   DVT prophylaxis: Lovenox Code Status: Full Family Communication: Updated patient.  No family at bedside. Disposition: Patient medically stable.  Awaiting transfer to inpatient rehab.   Status is: Inpatient Remains inpatient appropriate because: Severity of illness   Consultants:  Psychiatry: Dr. Dwyane Dee 08/07/2022  Procedures:  CT chest 08/05/2022 Chest x-ray 08/06/2022, 08/07/2022, 08/08/2022, 08/09/2022  Antimicrobials: IV azithromycin 08/05/2022>>>> 08/09/2022 IV Rocephin 08/05/2022>>>> 08/10/2022   Subjective: Laying in bed.  No chest pain.  No shortness of breath.  Tolerated CPAP last night.  No significant complaints.   Objective: Vitals:   08/14/22 2227 08/14/22 2315 08/15/22 0542 08/15/22 0921  BP:  (!) 94/47 (!) 89/46    Pulse: 84 74 65   Resp: 18 17 18    Temp:  97.8 F (36.6 C) 97.9 F (36.6 C)   TempSrc:  Oral Oral   SpO2: 95% 93% 90% 96%  Weight:      Height:        Intake/Output Summary (Last 24 hours) at 08/15/2022 1004 Last data filed at 08/15/2022 0900 Gross per 24 hour  Intake 1800 ml  Output --  Net 1800 ml    Filed Weights   08/11/22 0500 08/12/22 0151 08/13/22 0703  Weight: (!) 143.2 kg (!) 145.5 kg (!) 146.4 kg    Examination:  General exam: Awake. Respiratory system: CTAB.  No wheezes, no crackles, no rhonchi.  Fair air movement.  Speaking in full sentences.    Cardiovascular system: Regular rate rhythm no murmurs rubs or gallops.  No JVD.  No lower extremity edema. Gastrointestinal system: Abdomen is soft, obese, nontender, nondistended, positive bowel sounds.  No rebound.  No guarding.   Central nervous system: Alert and oriented. No focal neurological deficits. Extremities: Symmetric 5 x 5 power. Skin: No rashes, lesions or ulcers Psychiatry: Judgement and insight appear poor to fair. Mood & affect flat.     Data Reviewed: I have personally reviewed following labs and imaging studies  CBC: Recent Labs  Lab  08/09/22 0314 08/10/22 0313 08/11/22 0330 08/12/22 0917  WBC 9.5 8.1 7.9 7.6  NEUTROABS 5.3 4.2 4.3  --   HGB 14.5 13.6 13.2 13.0  HCT 46.3* 44.0 42.1 42.1  MCV 93.2 94.4 93.8 93.8  PLT 360 335 308 330     Basic Metabolic Panel: Recent Labs  Lab 08/09/22 0314 08/10/22 0313 08/11/22 0330 08/12/22 0917 08/13/22 0311 08/14/22 0939  NA 136 135 134* 134* 136 136  K 4.6 4.5 4.9 4.7 4.3 4.5  CL 101 99 100 101 101 103  CO2 26 27 26 28 27 25   GLUCOSE 112* 106* 102* 131* 103* 148*  BUN 15 24* 29* 23* 25* 24*  CREATININE 0.82 0.93 0.72 0.79 0.81 0.83  CALCIUM 8.8* 8.6* 8.7* 8.9 8.5* 8.9  MG 2.3 2.5* 2.2  --  2.1  --   PHOS 3.4  --   --   --   --   --      GFR: Estimated Creatinine Clearance: 126.1 mL/min (by C-G formula based on SCr of 0.83  mg/dL).  Liver Function Tests: Recent Labs  Lab 08/09/22 0314 08/10/22 0313 08/12/22 0917  AST 15 14* 12*  ALT 13 13 12   ALKPHOS 55 50 52  BILITOT 0.6 0.7 0.4  PROT 7.4 7.1 7.0  ALBUMIN 3.2* 3.2* 3.3*     CBG: Recent Labs  Lab 08/09/22 0732 08/10/22 0819 08/11/22 0809 08/12/22 0757  GLUCAP 114* 124* 110* 110*      Recent Results (from the past 240 hour(s))  Culture, blood (routine x 2)     Status: None   Collection Time: 08/05/22  4:35 PM   Specimen: BLOOD RIGHT FOREARM  Result Value Ref Range Status   Specimen Description   Final    BLOOD RIGHT FOREARM Performed at [facility], [place] [address] [place], [place] [address]    Special Requests   Final    BOTTLES DRAWN AEROBIC AND ANAEROBIC Blood Culture adequate volume Performed at [place] [address] [place], [place] [address]    Culture   Final    NO GROWTH 5 DAYS Performed at [facility], [place] [address], [place], [place] [address]    Report Status 08/11/2022 FINAL  Final  Resp panel by RT-PCR (RSV, Flu A&B, Covid) Anterior Nasal Swab     Status: None   Collection Time: 08/05/22  6:51 PM   Specimen: Anterior Nasal Swab  Result Value Ref Range Status   SARS Coronavirus 2 by RT PCR NEGATIVE NEGATIVE Final    Comment: (NOTE) SARS-CoV-2 target nucleic acids are NOT DETECTED.  The SARS-CoV-2 RNA is generally detectable in upper respiratory specimens during the acute phase of infection. The lowest concentration of SARS-CoV-2 viral copies this assay can detect is 138 copies/mL. A negative result does not preclude SARS-Cov-2 infection and should not be used as the sole basis for treatment or other patient management decisions. A negative result may occur with  improper specimen collection/handling, submission of specimen other than nasopharyngeal swab, presence of viral mutation(s) within the areas targeted by this assay, and inadequate number of viral copies(<138  copies/mL). A negative result must be combined with clinical observations, patient history, and epidemiological information. The expected result is Negative.  Fact Sheet for Patients:  EntrepreneurPulse.com.au  Fact Sheet for Healthcare Providers:  IncredibleEmployment.be  This test is no t yet approved or cleared by the [place] FDA and  has been authorized for detection and/or diagnosis of  SARS-CoV-2 by FDA under an Emergency Use Authorization (EUA). This EUA will remain  in effect (meaning this test can be used) for the duration of the COVID-19 declaration under Section 564(b)(1) of the Act, 21 U.S.C.section 360bbb-3(b)(1), unless the authorization is terminated  or revoked sooner.       Influenza A by PCR NEGATIVE NEGATIVE Final   Influenza B by PCR NEGATIVE NEGATIVE Final    Comment: (NOTE) The Xpert Xpress SARS-CoV-2/FLU/RSV plus assay is intended as an aid in the diagnosis of influenza from Nasopharyngeal swab specimens and should not be used as a sole basis for treatment. Nasal washings and aspirates are unacceptable for Xpert Xpress SARS-CoV-2/FLU/RSV testing.  Fact Sheet for Patients: EntrepreneurPulse.com.au  Fact Sheet for Healthcare Providers: IncredibleEmployment.be  This test is not yet approved or cleared by the [place] FDA and has been authorized for detection and/or diagnosis of SARS-CoV-2 by FDA under an Emergency Use Authorization (EUA). This EUA will remain in effect (meaning this test can be used) for the duration of the COVID-19 declaration under Section 564(b)(1) of the Act, 21 U.S.C. section 360bbb-3(b)(1), unless the authorization is terminated or revoked.     Resp Syncytial Virus by PCR NEGATIVE NEGATIVE Final    Comment: (NOTE) Fact Sheet for Patients: EntrepreneurPulse.com.au  Fact Sheet for Healthcare  Providers: IncredibleEmployment.be  This test is not yet approved or cleared by the [place] FDA and has been authorized for detection and/or diagnosis of SARS-CoV-2 by FDA under an Emergency Use Authorization (EUA). This EUA will remain in effect (meaning this test can be used) for the duration of the COVID-19 declaration under Section 564(b)(1) of the Act, 21 U.S.C. section 360bbb-3(b)(1), unless the authorization is terminated or revoked.  Performed at [facility], [place] [address], [place], [name] [address]   Respiratory (~20 pathogens) panel by PCR     Status: None   Collection Time: 08/05/22  6:51 PM   Specimen: Nasopharyngeal Swab; Respiratory  Result Value Ref Range Status   Adenovirus NOT DETECTED NOT DETECTED Final   Coronavirus 229E NOT DETECTED NOT DETECTED Final    Comment: (NOTE) The Coronavirus on the Respiratory Panel, DOES NOT test for the novel  Coronavirus (2019 nCoV)    Coronavirus HKU1 NOT DETECTED NOT DETECTED Final   Coronavirus NL63 NOT DETECTED NOT DETECTED Final   Coronavirus OC43 NOT DETECTED NOT DETECTED Final   Metapneumovirus NOT DETECTED NOT DETECTED Final   Rhinovirus / Enterovirus NOT DETECTED NOT DETECTED Final   Influenza A NOT DETECTED NOT DETECTED Final   Influenza B NOT DETECTED NOT DETECTED Final   Parainfluenza Virus 1 NOT DETECTED NOT DETECTED Final   Parainfluenza Virus 2 NOT DETECTED NOT DETECTED Final   Parainfluenza Virus 3 NOT DETECTED NOT DETECTED Final   Parainfluenza Virus 4 NOT DETECTED NOT DETECTED Final   Respiratory Syncytial Virus NOT DETECTED NOT DETECTED Final   Bordetella pertussis NOT DETECTED NOT DETECTED Final   Bordetella Parapertussis NOT DETECTED NOT DETECTED Final   Chlamydophila pneumoniae NOT DETECTED NOT DETECTED Final   Mycoplasma pneumoniae NOT DETECTED NOT DETECTED Final    Comment: Performed at [facility], [place]. [address] [place], [place] [address]   Culture, blood (routine x 2)     Status: None   Collection Time: 08/05/22  9:04 PM   Specimen: BLOOD RIGHT HAND  Result Value Ref Range Status   Specimen Description   Final    BLOOD RIGHT HAND Performed at [facility],  [address], [place], [place] [address]    Special Requests   Final    BOTTLES DRAWN AEROBIC ONLY Blood Culture adequate volume Performed at [place] [address], [place], [place] [address]    Culture   Final    NO GROWTH 5 DAYS Performed at [facility], [place] [address], [place], [place] [address]    Report Status 08/11/2022 FINAL  Final  MRSA Next Gen by PCR, Nasal     Status: None   Collection Time: 08/05/22  9:45 PM   Specimen: Nasal Mucosa; Nasal Swab  Result Value Ref Range Status   MRSA by PCR Next Gen NOT DETECTED NOT DETECTED Final    Comment: (NOTE) The GeneXpert MRSA Assay (FDA approved for NASAL specimens only), is one component of a comprehensive MRSA colonization surveillance program. It is not intended to diagnose MRSA infection nor to guide or monitor treatment for MRSA infections. Test performance is not FDA approved in patients less than 69 years old. Performed at [facility], [place] [address] [place], [place] [address]          Radiology Studies: No results found.      Scheduled Meds:  arformoterol  15 mcg Nebulization BID   ARIPiprazole  12.5 mg Oral Daily   budesonide (PULMICORT) nebulizer solution  0.5 mg Nebulization BID   clotrimazole  1 Applicatorful Vaginal QHS   enoxaparin (LOVENOX) injection  70 mg Subcutaneous Q24H   FLUoxetine  40 mg Oral Daily   gabapentin  800 mg Oral TID   guaiFENesin  1,200 mg Oral BID   lidocaine  1 patch Transdermal Daily   pantoprazole  40 mg Oral Daily   traZODone  100 mg Oral QHS   Continuous Infusions:  sodium chloride Stopped (08/10/22 1114)     LOS: 10 days    Time spent: 35 minutes    Irine Seal, MD [facility]   To contact the attending provider between 7A-7P or the covering provider during after hours 7P-7A, please log into the web site [email or web address] and access using universal [place] password for that web site. If you do not have the password, please call the hospital operator.  08/15/2022, 10:04 AM

## 2022-08-15 NOTE — TOC Progression Note (Signed)
Transition of Care [facility]) - Progression Note   Patient Details  Name: Sarah Cervantes MRN: [id number] Date of Birth: [birth year]  Transition of Care [facility]) CM/SW Bishop, LCSW Phone Number: 08/15/2022, 1:02 PM  Clinical Narrative: [facility] will not accept the patient for substance use treatment as she has insurance and [facility] in [place] will only accept the patient once she has completed a 28 day program. Referrals were faxed to [facility] [phone]) and [facility] [phone]).    Barriers to Discharge: Continued Medical Work up, Psych Bed not available  Expected Discharge Plan and Services In-house Referral: NA Discharge Planning Services: CM Consult Living arrangements for the past 2 months: Hotel/Motel  Social Determinants of Health (SDOH) Interventions SDOH Screenings   Food Insecurity: Food Insecurity Present (08/06/2022)  Housing: Medium Risk (08/06/2022)  Transportation Needs: No Transportation Needs (08/06/2022)  Utilities: Not At Risk (08/06/2022)  Alcohol Screen: Medium Risk (04/10/2018)  Tobacco Use: High Risk (08/05/2022)   Readmission Risk Interventions     No data to display

## 2022-08-16 ENCOUNTER — Other Ambulatory Visit (HOSPITAL_COMMUNITY): Payer: Self-pay

## 2022-08-16 MED ORDER — TRAMADOL HCL 50 MG PO TABS
100.0000 mg | ORAL_TABLET | Freq: Four times a day (QID) | ORAL | 0 refills | Status: DC | PRN
  Filled 2022-08-16: qty 20, 3d supply, fill #0

## 2022-08-16 MED ORDER — BENZOCAINE 10 % MT GEL
OROMUCOSAL | 0 refills | Status: DC | PRN
  Filled 2022-08-16: qty 7, fill #0

## 2022-08-16 MED ORDER — POLYETHYLENE GLYCOL 3350 17 G PO PACK
17.0000 g | PACK | Freq: Every day | ORAL | 0 refills | Status: DC | PRN
  Filled 2022-08-16: qty 14, 14d supply, fill #0

## 2022-08-16 MED ORDER — DICLOFENAC SODIUM 1 % EX GEL
2.0000 g | Freq: Four times a day (QID) | CUTANEOUS | 0 refills | Status: DC | PRN
  Filled 2022-08-16: qty 100, 13d supply, fill #0

## 2022-08-16 MED ORDER — ARIPIPRAZOLE 5 MG PO TABS
12.5000 mg | ORAL_TABLET | Freq: Every day | ORAL | 0 refills | Status: DC
  Filled 2022-08-16: qty 75, 30d supply, fill #0

## 2022-08-16 NOTE — Discharge Instructions (Signed)
Emergency Shelters/ Transitional Housing  [facility]  Address: [place], [place] [address]  Phone: [phone]  ([place] [facility]  Address: [place]. [place] [address]  Phone: [facility] Address: [address] [place], [place], [place] [address]  Phone: [phone]  [facility] Address: [address] [place], [place] [address]  Phone: [phone]  [place]  [address] [place] [address]
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
## 2022-08-16 NOTE — Plan of Care (Signed)
  Problem: Activity: Goal: Ability to tolerate increased activity will improve 08/16/2022 1456 by Lise Auer, LPN Outcome: Adequate for Discharge 08/16/2022 0747 by Lise Auer, LPN Outcome: Progressing   Problem: Clinical Measurements: Goal: Ability to maintain a body temperature in the normal range will improve Outcome: Adequate for Discharge   Problem: Respiratory: Goal: Ability to maintain adequate ventilation will improve Outcome: Adequate for Discharge Goal: Ability to maintain a clear airway will improve Outcome: Adequate for Discharge   Problem: Education: Goal: Knowledge of General Education information will improve Description: Including pain rating scale, medication(s)/side effects and non-pharmacologic comfort measures Outcome: Adequate for Discharge   Problem: Health Behavior/Discharge Planning: Goal: Ability to manage health-related needs will improve Outcome: Adequate for Discharge   Problem: Clinical Measurements: Goal: Ability to maintain clinical measurements within normal limits will improve Outcome: Adequate for Discharge Goal: Will remain free from infection Outcome: Adequate for Discharge Goal: Diagnostic test results will improve Outcome: Adequate for Discharge Goal: Respiratory complications will improve Outcome: Adequate for Discharge   Problem: Activity: Goal: Risk for activity intolerance will decrease 08/16/2022 1456 by Iver Nestle A, LPN Outcome: Adequate for Discharge 08/16/2022 0747 by Lise Auer, LPN Outcome: Progressing   Problem: Nutrition: Goal: Adequate nutrition will be maintained Outcome: Adequate for Discharge   Problem: Coping: Goal: Level of anxiety will decrease 08/16/2022 1456 by Lise Auer, LPN Outcome: Adequate for Discharge 08/16/2022 0747 by Lise Auer, LPN Outcome: Progressing   Problem: Elimination: Goal: Will not experience complications related to bowel motility Outcome: Adequate for  Discharge Goal: Will not experience complications related to urinary retention Outcome: Adequate for Discharge   Problem: Pain Managment: Goal: General experience of comfort will improve Outcome: Adequate for Discharge   Problem: Safety: Goal: Ability to remain free from injury will improve Outcome: Adequate for Discharge   Problem: Skin Integrity: Goal: Risk for impaired skin integrity will decrease Outcome: Adequate for Discharge

## 2022-08-16 NOTE — Plan of Care (Signed)
  Problem: Activity: Goal: Ability to tolerate increased activity will improve Outcome: Progressing   Problem: Activity: Goal: Risk for activity intolerance will decrease Outcome: Progressing   Problem: Coping: Goal: Level of anxiety will decrease Outcome: Progressing

## 2022-08-16 NOTE — Discharge Summary (Signed)
Physician Discharge Summary  Sarah Cervantes [id number] DOB: [birth year] DOA: 08/05/2022  PCP: Patient, No Pcp Per  Admit date: 08/05/2022 Discharge date: 08/16/2022  Admitted From: Home Disposition: Home  Recommendations for Outpatient Follow-up:  Follow up with PCP in 1 week with repeat CBC/BMP Outpatient follow-up with psychiatry Follow up in ED if symptoms worsen or new appear   Home Health: No Equipment/Devices: None  Discharge Condition: Stable CODE STATUS: Full Diet recommendation: Heart healthy  Brief/Interim Summary: 42 year old female with history of polysubstance abuse including daily cocaine use, alcohol abuse, heroin abuse, COPD, chronic respite failure with hypoxia, previous suicide attempts, depression and anxiety as well as bipolar 1 disorder presented with worsening shortness of breath, fall and suicidal ideation. She was admitted for acute respite failure with hypoxia with CT of the chest showing multifocal groundglass opacities. She initially required BiPAP and was treated with IV Rocephin and Zithromax. She has subsequently completed antibiotic treatment and respiratory status stabilized. Patient's overall condition has improved. As per psychiatry, patient does not need involuntary commitment or inpatient psychiatric hospitalization but psychiatry is recommending that patient be admitted to inpatient substance abuse rehabilitation program.  Attempts were made for the same but was unsuccessful.  Subsequently, psychiatry has cleared the patient for discharge.  She will be discharged today with outpatient follow-up with PCP and psychiatry.   Discharge Diagnoses:   Acute on chronic hypoxic respiratory failure Possible community-acquired bilateral pneumonia History of obesity hypoventilation syndrome/obstructive sleep apnea -Required BiPAP initially.  Chronically wears 3 L oxygen via nasal cannula. -Completed 5 days of Rocephin and Zithromax and currently off of  antibiotics.  Also received 2 doses of IV Lasix on 08/26/2022. -Respiratory status stable currently.  Continue CPAP nightly. -Continue home inhaled regimen on discharge.   Acute metabolic encephalopathy Polysubstance abuse -UDS was positive for cocaine, opiates, THC.  Salicylate level less than 10, alcohol level less than 10 -Mental status has much improved and currently back to baseline.   Suicidal ideation/history of bipolar 1 disorder - As per psychiatry, patient does not need involuntary commitment or inpatient psychiatric hospitalization but psychiatry is recommending that patient be admitted to inpatient substance abuse rehabilitation program. Attempts were made for the same but was unsuccessful.  Subsequently, psychiatry has cleared the patient for discharge.  She will be discharged today with outpatient follow-up with PCP and psychiatry.  -Continue aripiprazole, fluoxetine, gabapentin, trazodone   Hypertension -Blood pressure on the lower side.  Continue amlodipine and lisinopril   Morbid obesity-outpatient follow-up   Fall -No PT follow-up recommended as per PT   Tooth pain -Continue lidocaine swish and swallow, oral gel as needed -Outpatient follow-up with dentistry  Discharge Instructions   Allergies as of 08/16/2022       Reactions   Latex Swelling, Rash, Other (See Comments)   Hands swell        Medication List     STOP taking these medications    diclofenac 1.3 % Ptch Commonly known as: FLECTOR   furosemide 40 MG tablet Commonly known as: Lasix   hydrocortisone 2.5 % rectal cream Commonly known as: ANUSOL-HC   nicotine polacrilex 2 MG gum Commonly known as: NICORETTE   prazosin 1 MG capsule Commonly known as: MINIPRESS   predniSONE 10 MG tablet Commonly known as: DELTASONE       TAKE these medications    acetaminophen 325 MG tablet Commonly known as: TYLENOL Take 2 tablets (650 mg total) by mouth every 6 (six) hours as needed for mild  pain (  or Fever >/= 101).   albuterol 108 (90 Base) MCG/ACT inhaler Commonly known as: VENTOLIN HFA Inhale 2 puffs into the lungs every 6 (six) hours as needed for wheezing or shortness of breath.   amLODipine 5 MG tablet Commonly known as: NORVASC Take 1 tablet (5 mg total) by mouth daily.   ARIPiprazole 5 MG tablet Commonly known as: Abilify Take 2.5 tablets (12.5 mg total) by mouth daily. What changed:  medication strength how much to take   benzocaine 10 % mucosal gel Commonly known as: ORAJEL Use as directed in the mouth or throat as needed for mouth pain (Tooth pain).   budesonide-formoterol 80-4.5 MCG/ACT inhaler Commonly known as: SYMBICORT Inhale 2 puffs into the lungs 2 (two) times daily.   butalbital-acetaminophen-caffeine 50-325-40 MG tablet Commonly known as: FIORICET Take 1 tablet by mouth every 4 (four) hours as needed for headache.   diclofenac Sodium 1 % Gel Commonly known as: VOLTAREN Apply 2 g topically 4 (four) times daily as needed (KNEE PAIN).   FLUoxetine 40 MG capsule Commonly known as: PROZAC Take 1 capsule (40 mg total) by mouth daily.   gabapentin 400 MG capsule Commonly known as: NEURONTIN Take 2 capsules (800 mg total) by mouth 3 (three) times daily.   hydrOXYzine 50 MG tablet Commonly known as: ATARAX Take 1 tablet (50 mg total) by mouth 3 (three) times daily as needed for anxiety.   ibuprofen 200 MG tablet Commonly known as: ADVIL Take 2 tablets (400 mg total) by mouth every 8 (eight) hours as needed for headache or mild pain.   lidocaine 5 % Commonly known as: LIDODERM Place 1 patch onto the skin daily. Remove & Discard patch within 12 hours or as directed by MD What changed: how much to take   lisinopril 10 MG tablet Commonly known as: ZESTRIL Take 1 tablet (10 mg total) by mouth in the morning.   Ozempic (0.25 or 0.5 MG/DOSE) 2 MG/1.5ML Sopn Generic drug: Semaglutide(0.25 or 0.5MG /DOS) Inject 0.5 mg into the skin every  Monday.   pantoprazole 40 MG tablet Commonly known as: PROTONIX Take 1 tablet (40 mg total) by mouth daily.   polyethylene glycol 17 g packet Commonly known as: MIRALAX / GLYCOLAX Take 17 g by mouth daily as needed for mild constipation.   senna 8.6 MG Tabs tablet Commonly known as: SENOKOT Take 1 tablet (8.6 mg total) by mouth daily.   traMADol 50 MG tablet Commonly known as: ULTRAM Take 2 tablets (100 mg total) by mouth every 6 (six) hours as needed for moderate pain.   traZODone 100 MG tablet Commonly known as: DESYREL Take 1 tablet (100 mg total) by mouth at bedtime.               Durable Medical Equipment  (From admission, onward)           Start     Ordered   08/16/22 1320  For home use only DME Walker rolling  Once       Comments: Bariatric  Question Answer Comment  Walker: With [facility]   Patient needs a walker to treat with the following condition Debility      08/16/22 [place] Follow up.   Contact information: [place], [place] [address] Closes 3?PM Phone: [phone]        [facility] Follow up.   Contact information: [address] [place], [place] [address] Phone: [phone]        PCP. Schedule an appointment as soon as possible for a visit in 1 week(s).          Psychiatrist. Schedule an appointment as soon as possible for a visit in 1 week(s).                 Allergies  Allergen Reactions   Latex Swelling, Rash and Other (See Comments)    Hands swell    Consultations: Psychiatry   Procedures/Studies: DG CHEST PORT 1 VIEW  Result Date: 08/09/2022 CLINICAL DATA:  42 year old female with shortness of breath. EXAM: PORTABLE CHEST - 1 VIEW COMPARISON:  08/07/2022, 08/08/2022 FINDINGS: The patient is rotated to the right. Unchanged cardiomegaly.  Similar appearing low lung volumes. Similar appearing mild diffuse hazy pulmonary opacities. No acute osseous abnormality. IMPRESSION: Similar appearing low lung volumes and mild diffuse hazy pulmonary opacities. Electronically Signed   By: Ruthann Cancer M.D.   On: 08/09/2022 07:50   DG CHEST PORT 1 VIEW  Result Date: 08/08/2022 CLINICAL DATA:  Follow-up pneumonia. EXAM: PORTABLE CHEST 1 VIEW COMPARISON:  Chest x-ray from yesterday. FINDINGS: Unchanged mild cardiomegaly. Unchanged small right pleural effusion and mild right basilar atelectasis. No pneumothorax. No acute osseous abnormality. IMPRESSION: 1. Unchanged small right pleural effusion and mild right basilar atelectasis. Electronically Signed   By: Titus Dubin M.D.   On: 08/08/2022 08:54   DG CHEST PORT 1 VIEW  Result Date: 08/07/2022 CLINICAL DATA:  Shortness of breath EXAM: PORTABLE CHEST 1 VIEW COMPARISON:  08/06/2022 and prior studies FINDINGS: Cardiomegaly again noted. Stable to slightly increased RIGHT pleural effusion and RIGHT LOWER lung opacity/atelectasis noted. No other changes are identified. There is no evidence of pneumothorax. IMPRESSION: 1. Stable to slightly increased RIGHT pleural effusion and RIGHT LOWER lung opacity/atelectasis. 2. Cardiomegaly. Electronically Signed   By: Margarette Canada M.D.   On: 08/07/2022 10:57   X-ray chest PA and lateral  Result Date: 08/06/2022 CLINICAL DATA:  Shortness of breath and weakness. EXAM: CHEST - 2 VIEW COMPARISON:  08/05/2022 CT, radiograph and prior studies FINDINGS: Cardiomegaly again noted. RIGHT pleural effusion and scattered opacities/atelectasis within both mid and lower lungs are again noted. There is no evidence of pneumothorax or new definite pulmonary opacity. No acute bony abnormalities are present. IMPRESSION: Unchanged appearance of the chest with RIGHT pleural effusion and scattered opacities/atelectasis within both mid and lower lungs. Electronically Signed   By: Margarette Canada  M.D.   On: 08/06/2022 10:15   CT Chest Wo Contrast  Result Date: 08/05/2022 CLINICAL DATA:  Respiratory illness. EXAM: CT CHEST WITHOUT CONTRAST TECHNIQUE: Multidetector CT imaging of the chest was performed following the standard protocol without IV contrast. RADIATION DOSE REDUCTION: This exam was performed according to the departmental dose-optimization program which includes automated exposure control, adjustment of the mA and/or kV according to patient size and/or use of iterative reconstruction technique. COMPARISON:  Chest x-ray same day FINDINGS: Cardiovascular: The heart is mildly enlarged. The aorta is normal in size. There is no pericardial effusion. Mediastinum/Nodes: No enlarged mediastinal or axillary lymph nodes. Thyroid gland, trachea, and esophagus demonstrate no significant findings. Lungs/Pleura: There is a small right pleural effusion which appears partially loculated in the major fissure in lateral aspect of the right lower hemithorax. There are atelectatic changes in the right middle lobe, right upper lobe and right  lower lobe with some airspace disease in the medial right middle lobe. Multifocal ground-glass opacities are seen throughout the bilateral lungs more prominent in the upper lobes. There is no pneumothorax. Trachea and central airways are patent. Upper Abdomen: No acute abnormality. Cholecystectomy clips are present. Musculoskeletal: Degenerative changes affect the spine IMPRESSION: 1. Small right pleural effusion which appears partially loculated in the major fissure and lateral aspect of the right lower hemithorax. 2. Multifocal ground-glass opacities throughout the bilateral lungs more prominent in the upper lobes. Findings are nonspecific and can be seen in the setting of multifocal pneumonia, pulmonary edema or hemorrhage. 3. Mild cardiomegaly. 4. There are areas of atelectasis throughout the right lung with a small amount of airspace disease in the right middle lobe  worrisome for infection. 5. Follow-up imaging recommended in 3-6 weeks to confirm resolution. Electronically Signed   By: Ronney Asters M.D.   On: 08/05/2022 15:10   DG Knee Complete 4 Views Left  Result Date: 08/05/2022 CLINICAL DATA:  Shortness of breath.  Fell yesterday. EXAM: LEFT KNEE - COMPLETE 4+ VIEW COMPARISON:  None Available. FINDINGS: There is mild degenerative change primarily of the MEDIAL and patellofemoral compartments. There is no acute fracture or subluxation. There is trace joint effusion. IMPRESSION: 1. Mild degenerative changes. 2. Trace joint effusion. Electronically Signed   By: Nolon Nations M.D.   On: 08/05/2022 13:33   DG Chest 2 View  Result Date: 08/05/2022 CLINICAL DATA:  Shortness of breath. EXAM: CHEST - 2 VIEW COMPARISON:  Chest radiograph 07/16/2022 FINDINGS: Limited exam due to technique. Monitoring leads overlie the patient. Stable cardiomegaly. Interval moderate right pleural effusion and underlying consolidation within the right lower lung. No definite pneumothorax. Thoracic spine degenerative changes. IMPRESSION: 1. Limited exam due to technique. 2. Moderate right pleural effusion with underlying consolidation. This is poorly evaluated on current exam. Consider repeat PA and lateral chest radiograph or chest CT for further evaluation depending upon clinical concern. Electronically Signed   By: Lovey Newcomer M.D.   On: 08/05/2022 13:31      Subjective: Patient seen and examined at bedside.  Denies worsening shortness breath, chest pain, fever or vomiting.    Discharge Exam: Vitals:   08/16/22 0817 08/16/22 0850  BP:  118/71  Pulse:    Resp:    Temp:    SpO2: 92%     General exam: Appears calm and comfortable.  Looks chronically ill and deconditioned. Respiratory system: Bilateral decreased breath sounds at bases with some scattered crackles Cardiovascular system: S1 & S2 heard, Rate controlled Gastrointestinal system: Abdomen is morbidly obese,  nondistended, soft and nontender. Normal bowel sounds heard. Extremities: No cyanosis, clubbing; trace lower extremity edema      The results of significant diagnostics from this hospitalization (including imaging, microbiology, ancillary and laboratory) are listed below for reference.     Microbiology: No results found for this or any previous visit (from the past 240 hour(s)).   Labs: BNP (last 3 results) Recent Labs    05/27/22 2130 08/05/22 2112 08/09/22 1154  BNP 190.3* 49.6 6.0   Basic Metabolic Panel: Recent Labs  Lab 08/10/22 0313 08/11/22 0330 08/12/22 0917 08/13/22 0311 08/14/22 0939  NA 135 134* 134* 136 136  K 4.5 4.9 4.7 4.3 4.5  CL 99 100 101 101 103  CO2 27 26 28 27 25   GLUCOSE 106* 102* 131* 103* 148*  BUN 24* 29* 23* 25* 24*  CREATININE 0.93 0.72 0.79 0.81 0.83  CALCIUM 8.6* 8.7*  8.9 8.5* 8.9  MG 2.5* 2.2  --  2.1  --    Liver Function Tests: Recent Labs  Lab 08/10/22 0313 08/12/22 0917  AST 14* 12*  ALT 13 12  ALKPHOS 50 52  BILITOT 0.7 0.4  PROT 7.1 7.0  ALBUMIN 3.2* 3.3*   No results for input(s): "LIPASE", "AMYLASE" in the last 168 hours. No results for input(s): "AMMONIA" in the last 168 hours. CBC: Recent Labs  Lab 08/10/22 0313 08/11/22 0330 08/12/22 0917  WBC 8.1 7.9 7.6  NEUTROABS 4.2 4.3  --   HGB 13.6 13.2 13.0  HCT 44.0 42.1 42.1  MCV 94.4 93.8 93.8  PLT 335 308 330   Cardiac Enzymes: No results for input(s): "CKTOTAL", "CKMB", "CKMBINDEX", "TROPONINI" in the last 168 hours. BNP: Invalid input(s): "POCBNP" CBG: Recent Labs  Lab 08/10/22 0819 08/11/22 0809 08/12/22 0757  GLUCAP 124* 110* 110*   D-Dimer No results for input(s): "DDIMER" in the last 72 hours. Hgb A1c No results for input(s): "HGBA1C" in the last 72 hours. Lipid Profile No results for input(s): "CHOL", "HDL", "LDLCALC", "TRIG", "CHOLHDL", "LDLDIRECT" in the last 72 hours. Thyroid function studies No results for input(s): "TSH", "T4TOTAL",  "T3FREE", "THYROIDAB" in the last 72 hours.  Invalid input(s): "FREET3" Anemia work up No results for input(s): "VITAMINB12", "FOLATE", "FERRITIN", "TIBC", "IRON", "RETICCTPCT" in the last 72 hours. Urinalysis    Component Value Date/Time   COLORURINE YELLOW 08/10/2022 1011   APPEARANCEUR CLEAR 08/10/2022 1011   LABSPEC 1.011 08/10/2022 1011   PHURINE 5.0 08/10/2022 1011   GLUCOSEU NEGATIVE 08/10/2022 1011   HGBUR NEGATIVE 08/10/2022 [place] 08/10/2022 [place] 08/10/2022 1011   PROTEINUR NEGATIVE 08/10/2022 1011   NITRITE NEGATIVE 08/10/2022 1011   LEUKOCYTESUR MODERATE (A) 08/10/2022 1011   Sepsis Labs Recent Labs  Lab 08/10/22 0313 08/11/22 0330 08/12/22 0917  WBC 8.1 7.9 7.6   Microbiology No results found for this or any previous visit (from the past 240 hour(s)).   Time coordinating discharge: 35 minutes  SIGNED:   Aline August, MD  [facility] 08/16/2022, 1:29 PM

## 2022-08-16 NOTE — Progress Notes (Signed)
PROGRESS NOTE    Sarah Cervantes  [id number] DOB: [birth year] DOA: 08/05/2022 PCP: Patient, No Pcp Per   Brief Narrative:  42 year old female with history of polysubstance abuse including daily cocaine use, alcohol abuse, heroin abuse, COPD, chronic respite failure with hypoxia, previous suicide attempts, depression and anxiety as well as bipolar 1 disorder presented with worsening shortness of breath, fall and suicidal ideation.  She was admitted for acute respite failure with hypoxia with CT of the chest showing multifocal groundglass opacities.  She initially required BiPAP and was treated with IV Rocephin and Zithromax.  She has subsequently completed antibiotic treatment and respiratory status stabilized.  Patient's overall condition has improved.  As per psychiatry, patient does not need involuntary commitment or inpatient psychiatric hospitalization but psychiatry is recommending that patient be admitted to inpatient substance abuse rehabilitation program.  TOC following.  Assessment & Plan:   Acute on chronic hypoxic respiratory failure Possible community-acquired bilateral pneumonia History of obesity hypoventilation syndrome/obstructive sleep apnea -Required BiPAP initially.  Chronically wears 3 L oxygen via nasal cannula. -Completed 5 days of Rocephin and Zithromax and currently off of antibiotics.  Also received 2 doses of IV Lasix on 08/26/2022. -Respiratory status stable currently.  Continue CPAP nightly. -Continue current nebs.  Acute metabolic encephalopathy Polysubstance abuse -UDS was positive for cocaine, opiates, THC.  Salicylate level less than 10, alcohol level less than 10 -Mental status has much improved and currently back to baseline.  Suicidal ideation/history of bipolar 1 disorder - As per psychiatry, patient does not need involuntary commitment or inpatient psychiatric hospitalization but psychiatry is recommending that patient be admitted to inpatient  substance abuse rehabilitation program.  TOC following. -Continue aripiprazole, fluoxetine, gabapentin, trazodone  Hypertension -Blood pressure on the lower side.  Continue amlodipine and lisinopril  Morbid obesity-outpatient follow-up  Fall -No PT follow-up recommended as per PT  Tooth pain -Continue lidocaine swish and swallow, oral gel as needed -Outpatient follow-up with dentistry  DVT prophylaxis: Lovenox Code Status: Full Family Communication: None at bedside Disposition Plan: Status is: Inpatient Remains inpatient appropriate because: Of severity of illness.  Awaiting transfer to inpatient substance abuse rehabilitation program.  Currently medically stable for discharge.  Consultants: Psychiatry  Procedures: None  Antimicrobials: Rocephin and Zithromax from 08/05/2022-08/09/2022   Subjective: Patient seen and examined at bedside.  Denies worsening shortness breath, chest pain, fever or vomiting.  Objective: Vitals:   08/16/22 0550 08/16/22 0744 08/16/22 0817 08/16/22 0850  BP: (!) 92/48   118/71  Pulse: 74     Resp: 18     Temp: 98.3 F (36.8 C)     TempSrc: Oral     SpO2: 91%  92%   Weight:  (!) 154.5 kg    Height:        Intake/Output Summary (Last 24 hours) at 08/16/2022 1103 Last data filed at 08/16/2022 [id number] Gross per 24 hour  Intake 720 ml  Output 0 ml  Net 720 ml   Filed Weights   08/12/22 0151 08/13/22 0703 08/16/22 0744  Weight: (!) 145.5 kg (!) 146.4 kg (!) 154.5 kg    Examination:  General exam: Appears calm and comfortable.  Looks chronically ill and deconditioned. Respiratory system: Bilateral decreased breath sounds at bases with some scattered crackles Cardiovascular system: S1 & S2 heard, Rate controlled Gastrointestinal system: Abdomen is morbidly obese, nondistended, soft and nontender. Normal bowel sounds heard. Extremities: No cyanosis, clubbing; trace lower extremity edema      Data Reviewed: I have personally reviewed  following labs and imaging studies  CBC: Recent Labs  Lab 08/10/22 0313 08/11/22 0330 08/12/22 0917  WBC 8.1 7.9 7.6  NEUTROABS 4.2 4.3  --   HGB 13.6 13.2 13.0  HCT 44.0 42.1 42.1  MCV 94.4 93.8 93.8  PLT 335 308 [id number]   Basic Metabolic Panel: Recent Labs  Lab 08/10/22 0313 08/11/22 0330 08/12/22 0917 08/13/22 0311 08/14/22 0939  NA 135 134* 134* 136 136  K 4.5 4.9 4.7 4.3 4.5  CL 99 100 101 101 103  CO2 27 26 28 27 25   GLUCOSE 106* 102* 131* 103* 148*  BUN 24* 29* 23* 25* 24*  CREATININE 0.93 0.72 0.79 0.81 0.83  CALCIUM 8.6* 8.7* 8.9 8.5* 8.9  MG 2.5* 2.2  --  2.1  --    GFR: Estimated Creatinine Clearance: 130.5 mL/min (by C-G formula based on SCr of 0.83 mg/dL). Liver Function Tests: Recent Labs  Lab 08/10/22 0313 08/12/22 0917  AST 14* 12*  ALT 13 12  ALKPHOS 50 52  BILITOT 0.7 0.4  PROT 7.1 7.0  ALBUMIN 3.2* 3.3*   No results for input(s): "LIPASE", "AMYLASE" in the last 168 hours. No results for input(s): "AMMONIA" in the last 168 hours. Coagulation Profile: No results for input(s): "INR", "PROTIME" in the last 168 hours. Cardiac Enzymes: No results for input(s): "CKTOTAL", "CKMB", "CKMBINDEX", "TROPONINI" in the last 168 hours. BNP (last 3 results) No results for input(s): "PROBNP" in the last 8760 hours. HbA1C: No results for input(s): "HGBA1C" in the last 72 hours. CBG: Recent Labs  Lab 08/10/22 0819 08/11/22 0809 08/12/22 0757  GLUCAP 124* 110* 110*   Lipid Profile: No results for input(s): "CHOL", "HDL", "LDLCALC", "TRIG", "CHOLHDL", "LDLDIRECT" in the last 72 hours. Thyroid Function Tests: No results for input(s): "TSH", "T4TOTAL", "FREET4", "T3FREE", "THYROIDAB" in the last 72 hours. Anemia Panel: No results for input(s): "VITAMINB12", "FOLATE", "FERRITIN", "TIBC", "IRON", "RETICCTPCT" in the last 72 hours. Sepsis Labs: No results for input(s): "PROCALCITON", "LATICACIDVEN" in the last 168 hours.  No results found for this or any  previous visit (from the past 240 hour(s)).       Radiology Studies: No results found.      Scheduled Meds:  amLODipine  5 mg Oral Daily   arformoterol  15 mcg Nebulization BID   ARIPiprazole  12.5 mg Oral Daily   budesonide (PULMICORT) nebulizer solution  0.5 mg Nebulization BID   clotrimazole  1 Applicatorful Vaginal QHS   enoxaparin (LOVENOX) injection  70 mg Subcutaneous Q24H   FLUoxetine  40 mg Oral Daily   gabapentin  800 mg Oral TID   guaiFENesin  1,200 mg Oral BID   lidocaine  1 patch Transdermal Daily   lisinopril  10 mg Oral Daily   pantoprazole  40 mg Oral Daily   senna  1 tablet Oral Daily   traZODone  100 mg Oral QHS   Continuous Infusions:  sodium chloride Stopped (08/10/22 1114)          Aline August, MD [facility] 08/16/2022, 11:03 AM

## 2022-08-16 NOTE — TOC Transition Note (Signed)
Transition of Care [facility]) - CM/SW Discharge Note  Patient Details  Name: Sarah Cervantes MRN: [id number] Date of Birth: 09-14-1980  Transition of Care [facility] CM/SW Contact:  Sherie Don, LCSW Phone Number: 08/16/2022, 3:02 PM  Clinical Narrative: CSW confirmed with patient's insurance that coverage ended 07/27/22, so patient will not be able to go to most of the residential treatment programs CSW made referrals to with the exception of [facility]. CSW spoke with Donita Brooks at [facility] and the facility is willing to possibly accept the patient, but the earliest she can be screened is September 23, 2022. Patient aware she will be discharged to the [facility] today and confirmed she will have a ride there and to [facility] on the 27th. CSW ordered charity bariatric rolling walker through [employer] with [employer], which was delivered to patient's room. TOC signing off.    Final next level of care: Other (comment) [facility]) Barriers to Discharge: Barriers Resolved  Discharge Plan and Services Additional resources added to the After Visit Summary for   In-house Referral: NA Discharge Planning Services: CM Consult       DME Arranged: Walker rolling (Bariatric rolling walker) DME Agency: [employer] Date DME Agency Contacted: 08/16/22 Representative spoke with at DME Agency: [place] ([place]) Interventions SDOH Screenings   Food Insecurity: Food Insecurity Present (08/06/2022)  Housing: Medium Risk (08/06/2022)  Transportation Needs: No Transportation Needs (08/06/2022)  Utilities: Not At Risk (08/06/2022)  Alcohol Screen: Medium Risk (04/10/2018)  Tobacco Use: High Risk (08/05/2022)   Readmission Risk Interventions     No data to display

## 2022-08-16 NOTE — Progress Notes (Signed)
Mobility Specialist - Progress Note   08/16/22 1322  Mobility  Activity Ambulated with assistance in hallway;Ambulated with assistance to bathroom  Level of Assistance Modified independent, requires aide device or extra time  Assistive Device Front wheel walker  Distance Ambulated (ft) 350 ft  Activity Response Tolerated well  Mobility Referral Yes  $Mobility charge 1 Mobility   Pt received in bed and agreeable to mobility. Pt c/o knees hurting, suggested pt use walker to decrease weight on knees. No other complaints during session. Pt to bed after session with all needs met.    [facility]

## 2022-08-17 ENCOUNTER — Other Ambulatory Visit (HOSPITAL_COMMUNITY): Payer: Self-pay

## 2022-08-21 NOTE — TOC CM/SW Note (Signed)
CSW received voicemail from [place] in admissions at [facility] in [facility] notifying CSW that patient has been denied admission.

## 2022-08-22 ENCOUNTER — Emergency Department (HOSPITAL_COMMUNITY): Payer: 59

## 2022-08-22 ENCOUNTER — Other Ambulatory Visit: Payer: Self-pay

## 2022-08-22 ENCOUNTER — Emergency Department (HOSPITAL_COMMUNITY)
Admission: EM | Admit: 2022-08-22 | Discharge: 2022-08-22 | Disposition: A | Discharge status: Other Acute Inpatient Hospital | Payer: 59 | Attending: Emergency Medicine | Admitting: Emergency Medicine

## 2022-08-22 ENCOUNTER — Encounter (HOSPITAL_COMMUNITY): Payer: Self-pay | Admitting: Emergency Medicine

## 2022-08-22 DIAGNOSIS — W19XXXA Unspecified fall, initial encounter: Secondary | ICD-10-CM

## 2022-08-22 DIAGNOSIS — Z1152 Encounter for screening for COVID-19: Secondary | ICD-10-CM | POA: Diagnosis not present

## 2022-08-22 DIAGNOSIS — J449 Chronic obstructive pulmonary disease, unspecified: Secondary | ICD-10-CM | POA: Insufficient documentation

## 2022-08-22 DIAGNOSIS — M545 Low back pain, unspecified: Secondary | ICD-10-CM | POA: Insufficient documentation

## 2022-08-22 DIAGNOSIS — Z79899 Other long term (current) drug therapy: Secondary | ICD-10-CM | POA: Diagnosis not present

## 2022-08-22 DIAGNOSIS — Z7951 Long term (current) use of inhaled steroids: Secondary | ICD-10-CM | POA: Insufficient documentation

## 2022-08-22 DIAGNOSIS — W182XXA Fall in (into) shower or empty bathtub, initial encounter: Secondary | ICD-10-CM | POA: Diagnosis not present

## 2022-08-22 DIAGNOSIS — M549 Dorsalgia, unspecified: Secondary | ICD-10-CM

## 2022-08-22 DIAGNOSIS — Z91040 Latex allergy status: Secondary | ICD-10-CM | POA: Insufficient documentation

## 2022-08-22 DIAGNOSIS — R45851 Suicidal ideations: Secondary | ICD-10-CM | POA: Diagnosis not present

## 2022-08-22 DIAGNOSIS — F332 Major depressive disorder, recurrent severe without psychotic features: Secondary | ICD-10-CM

## 2022-08-22 LAB — RESP PANEL BY RT-PCR (RSV, FLU A&B, COVID)  RVPGX2
Influenza A by PCR: NEGATIVE
Influenza B by PCR: NEGATIVE
Resp Syncytial Virus by PCR: NEGATIVE
SARS Coronavirus 2 by RT PCR: NEGATIVE

## 2022-08-22 LAB — COMPREHENSIVE METABOLIC PANEL
ALT: 22 U/L (ref 0–44)
AST: 16 U/L (ref 15–41)
Albumin: 3.6 g/dL (ref 3.5–5.0)
Alkaline Phosphatase: 74 U/L (ref 38–126)
Anion gap: 9 (ref 5–15)
BUN: 14 mg/dL (ref 6–20)
CO2: 23 mmol/L (ref 22–32)
Calcium: 9 mg/dL (ref 8.9–10.3)
Chloride: 103 mmol/L (ref 98–111)
Creatinine, Ser: 0.83 mg/dL (ref 0.44–1.00)
GFR, Estimated: >60 mL/min (ref >60)
Glucose, Bld: 101 mg/dL — ABNORMAL HIGH (ref 70–99)
Potassium: 4 mmol/L (ref 3.5–5.1)
Sodium: 135 mmol/L (ref 135–145)
Total Bilirubin: 0.6 mg/dL (ref 0.3–1.2)
Total Protein: 7.9 g/dL (ref 6.5–8.1)

## 2022-08-22 LAB — RAPID URINE DRUG SCREEN, HOSP PERFORMED
Amphetamines: NOT DETECTED
Barbiturates: NOT DETECTED
Benzodiazepines: NOT DETECTED
Cocaine: NOT DETECTED
Opiates: NOT DETECTED
Tetrahydrocannabinol: NOT DETECTED

## 2022-08-22 LAB — ACETAMINOPHEN LEVEL: Acetaminophen (Tylenol), Serum: <10 ug/mL — ABNORMAL LOW (ref 10–30)

## 2022-08-22 LAB — CBC
HCT: 47.1 % — ABNORMAL HIGH (ref 36.0–46.0)
Hemoglobin: 14.8 g/dL (ref 12.0–15.0)
MCH: 29 pg (ref 26.0–34.0)
MCHC: 31.4 g/dL (ref 30.0–36.0)
MCV: 92.4 fL (ref 80.0–100.0)
Platelets: 385 10*3/uL (ref 150–400)
RBC: 5.1 MIL/uL (ref 3.87–5.11)
RDW: 15.7 % — ABNORMAL HIGH (ref 11.5–15.5)
WBC: 13.1 10*3/uL — ABNORMAL HIGH (ref 4.0–10.5)
nRBC: 0 % (ref 0.0–0.2)

## 2022-08-22 LAB — ETHANOL: Alcohol, Ethyl (B): <10 mg/dL (ref <10)

## 2022-08-22 LAB — I-STAT BETA HCG BLOOD, ED (MC, WL, AP ONLY): I-stat hCG, quantitative: <5 m[IU]/mL (ref <5)

## 2022-08-22 LAB — SALICYLATE LEVEL: Salicylate Lvl: <7 mg/dL — ABNORMAL LOW (ref 7.0–30.0)

## 2022-08-22 MED ORDER — TRAZODONE HCL 100 MG PO TABS: 100.0000 mg | ORAL_TABLET | Freq: Every evening | ORAL | Status: DC | PRN

## 2022-08-22 MED ORDER — GABAPENTIN 400 MG PO CAPS
400.0000 mg | ORAL_CAPSULE | Freq: Three times a day (TID) | ORAL | Status: DC
  Administered 2022-08-22 (×2): 400 mg via ORAL
  Filled 2022-08-22 (×2): qty 1

## 2022-08-22 MED ORDER — OXYCODONE-ACETAMINOPHEN 5-325 MG PO TABS
1.0000 | ORAL_TABLET | Freq: Once | ORAL | Status: AC
  Administered 2022-08-22: 1 via ORAL
  Filled 2022-08-22: qty 1

## 2022-08-22 MED ORDER — ARIPIPRAZOLE 5 MG PO TABS
12.5000 mg | ORAL_TABLET | Freq: Every day | ORAL | Status: DC
  Administered 2022-08-22: 12.5 mg via ORAL
  Filled 2022-08-22 (×2): qty 1

## 2022-08-22 MED ORDER — HYDROXYZINE HCL 50 MG PO TABS: 50.0000 mg | ORAL_TABLET | Freq: Three times a day (TID) | ORAL | Status: DC | PRN

## 2022-08-22 MED ORDER — FLUOXETINE HCL 20 MG PO CAPS
40.0000 mg | ORAL_CAPSULE | Freq: Every day | ORAL | Status: DC
  Administered 2022-08-22: 40 mg via ORAL
  Filled 2022-08-22: qty 2

## 2022-08-22 NOTE — ED Notes (Signed)
Report attempted numerous times with no answer each time. Will re-attempt.

## 2022-08-22 NOTE — ED Notes (Signed)
Report attempted, no answer, transport at facility. Charge nurse notified. Will re-attempt.

## 2022-08-22 NOTE — ED Notes (Signed)
Report attempted, no answer on numerous numbers for facility, will re-attempt.

## 2022-08-22 NOTE — ED Notes (Signed)
Unable to dress patient in burgundy scrubs or paper scrubs due to size and fit. Pt dressed in hospital gown.

## 2022-08-22 NOTE — ED Provider Notes (Signed)
[facility] Provider Note   CSN: [id number] Arrival date & time: 08/22/22  0000     History  Chief Complaint  Patient presents with   Suicidal   Back Pain    Sarah Cervantes is a 42 y.o. female.  The history is provided by the patient and medical records.  Back Pain Sarah Cervantes is a 42 y.o. female who presents to the Emergency Department complaining of fall, suicidal.  She presents to the ED by EMS from [facility] for for both fall/back pain and SI.  She states that she was in the shower and slipped on old soap and landed on her back and complains of diffuse back pain.  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  She cannot localize the pain in her back to 1 region over another.  She states she is also depressed and suicidal.  She states that she cannot afford her psychiatric medications and has been off of them for at least 5 days and she has a plan to overdose on pills.  She does have a prior suicide attempt by overdose in the past per patient.  She denies any tobacco, alcohol use.  She does use occasional marijuana.  She was recently admitted to the hospital for pneumonia, but has since completed treatment.  Per patient reports she is on 3 L nasal cannula at baseline, but does not have this available currently and states it is a long story and does not want to get into it.     Home Medications Prior to Admission medications   Medication Sig Start Date End Date Taking? Authorizing Provider  acetaminophen (TYLENOL) 325 MG tablet Take 2 tablets (650 mg total) by mouth every 6 (six) hours as needed for mild pain (or Fever >/= 101). 03/16/22   Sheikh, Georgina Quint Latif, DO  albuterol (VENTOLIN HFA) 108 (90 Base) MCG/ACT inhaler Inhale 2 puffs into the lungs every 6 (six) hours as needed for wheezing or shortness of breath.    [provider]  amLODipine (NORVASC) 5 MG tablet Take 1 tablet (5 mg total) by mouth  daily. 07/26/22 10/24/22  Pokhrel, Corrie Mckusick, MD  ARIPiprazole (ABILIFY) 5 MG tablet Take 2 1/2 tablets (12.5 mg total) by mouth daily. 08/16/22 09/15/22  Aline August, MD  benzocaine (ORAJEL) 10 % mucosal gel Use as directed in the mouth or throat as needed for mouth pain (Tooth pain). 08/16/22   Aline August, MD  budesonide-formoterol (SYMBICORT) 80-4.5 MCG/ACT inhaler Inhale 2 puffs into the lungs 2 (two) times daily. 02/28/22   [provider]  butalbital-acetaminophen-caffeine (FIORICET) 50-325-40 MG tablet Take 1 tablet by mouth every 4 (four) hours as needed for headache.    [provider]  diclofenac Sodium (VOLTAREN) 1 % GEL Apply 2 g topically 4 (four) times daily as needed (KNEE PAIN). 08/16/22   Aline August, MD  FLUoxetine (PROZAC) 40 MG capsule Take 1 capsule (40 mg total) by mouth daily. 07/26/22 10/24/22  Pokhrel, Corrie Mckusick, MD  gabapentin (NEURONTIN) 400 MG capsule Take 2 capsules (800 mg total) by mouth 3 (three) times daily. 07/26/22 10/24/22  Pokhrel, Corrie Mckusick, MD  hydrOXYzine (ATARAX) 50 MG tablet Take 1 tablet (50 mg total) by mouth 3 (three) times daily as needed for anxiety. 04/06/22   Massengill, Ovid Curd, MD  ibuprofen (ADVIL) 200 MG tablet Take 2 tablets (400 mg total) by mouth every 8 (eight) hours as needed for headache or mild pain. 07/26/22  Pokhrel, Laxman, MD  lidocaine (LIDODERM) 5 % Place 1 patch onto the skin daily. Remove & Discard patch within 12 hours or as directed by MD Patient taking differently: Place 2 patches onto the skin daily. Remove & Discard patch within 12 hours or as directed by MD 03/16/22   Raiford Noble Latif, DO  lisinopril (ZESTRIL) 10 MG tablet Take 1 tablet (10 mg total) by mouth in the morning. 07/25/22 10/23/22  Pokhrel, Corrie Mckusick, MD  OZEMPIC, 0.25 OR 0.5 MG/DOSE, 2 MG/1.5ML SOPN Inject 0.5 mg into the skin every Monday.    [provider]  pantoprazole (PROTONIX) 40 MG tablet Take 1 tablet (40 mg total) by mouth daily. 07/26/22  10/24/22  Pokhrel, Corrie Mckusick, MD  polyethylene glycol (MIRALAX / GLYCOLAX) 17 g packet Take 17 g by mouth daily as needed for mild constipation. 08/16/22   Aline August, MD  senna (SENOKOT) 8.6 MG TABS tablet Take 1 tablet (8.6 mg total) by mouth daily. 04/06/22   Massengill, Ovid Curd, MD  traMADol (ULTRAM) 50 MG tablet Take 2 tablets (100 mg total) by mouth every 6 (six) hours as needed for moderate pain. 08/16/22   Aline August, MD  traZODone (DESYREL) 100 MG tablet Take 1 tablet (100 mg total) by mouth at bedtime. 07/26/22 10/24/22  Flora Lipps, MD      Allergies    Latex    Review of Systems   Review of Systems  Musculoskeletal:  Positive for back pain.  All other systems reviewed and are negative.   Physical Exam Updated Vital Signs BP (!) 143/90   Pulse 80   Temp 98 F (36.7 C)   Resp 18   Ht 5\' 4"  (1.626 m)   Wt (!) 143.3 kg   LMP 07/01/2021   SpO2 96%   BMI 54.24 kg/m  Physical Exam Vitals and nursing note reviewed.  Constitutional:      Appearance: She is well-developed.  HENT:     Head: Normocephalic and atraumatic.  Cardiovascular:     Rate and Rhythm: Normal rate and regular rhythm.     Heart sounds: No murmur heard. Pulmonary:     Effort: Pulmonary effort is normal. No respiratory distress.     Breath sounds: Normal breath sounds.  Abdominal:     Palpations: Abdomen is soft.     Tenderness: There is no abdominal tenderness. There is no guarding or rebound.  Musculoskeletal:     Comments: There is tenderness to palpation throughout the entire back without any localizing region of tenderness or overlying ecchymosis.  Skin:    General: Skin is warm and dry.  Neurological:     Mental Status: She is alert and oriented to person, place, and time.     Comments: 5 out of 5 strength in all 4 extremities  Psychiatric:        Behavior: Behavior normal.     Comments: Flat affect     ED Results / Procedures / Treatments   Labs (all labs ordered are listed, but  only abnormal results are displayed) Labs Reviewed  COMPREHENSIVE METABOLIC PANEL - Abnormal; Notable for the following components:      Result Value   Glucose, Bld 101 (*)    All other components within normal limits  CBC - Abnormal; Notable for the following components:   WBC 13.1 (*)    HCT 47.1 (*)    RDW 15.7 (*)    All other components within normal limits  ETHANOL  SALICYLATE LEVEL  ACETAMINOPHEN LEVEL  RAPID URINE DRUG SCREEN, HOSP PERFORMED  I-STAT BETA HCG BLOOD, ED ([facility], [facility], [facility] ONLY)    EKG None  Radiology No results found.  Procedures Procedures    Medications Ordered in ED Medications  oxyCODONE-acetaminophen (PERCOCET/ROXICET) 5-325 MG per tablet 1 tablet (has no administration in time range)    ED Course/ Medical Decision Making/ A&P                             Medical Decision Making Amount and/or Complexity of Data Reviewed Labs: ordered. Radiology: ordered.  Risk Prescription drug management.    Patient with history of polysubstance abuse, COPD, bipolar disorder here for evaluation of chief complaint of back pain following fall as well as suicidal ideation.  In terms of her back pain, she has generalized tenderness without any focal bony tenderness.  Plain films are negative for acute fracture.  She is neurologically intact.  No evidence of serious intrathoracic/intra-abdominal injury.  Recommended acetaminophen or ibuprofen as needed pain for her back pain.  In terms of her SI, she states that this is due to being off of her medications for several days.  She does have plan to overdose.  TTS consulted regarding her SI.  CBC with leukocytosis, no evidence of acute infectious process at this time.  She is stable for psychiatric evaluation and treatment.  Patient is in the emergency department voluntarily at this time.  Disposition per psychiatry team.       Final Clinical Impression(s) / ED Diagnoses Final diagnoses:  None    Rx / DC  Orders ED Discharge Orders     None         [name], MD 08/22/22 [phone]

## 2022-08-22 NOTE — Consult Note (Signed)
[facility] ED ASSESSMENT   Reason for Consult:  SI Referring Physician:  Dr. Ralene Bathe Patient Identification: Sarah Cervantes MRN:  [id number] ED Chief Complaint: MDD (major depressive disorder), recurrent episode, severe ([place])  Diagnosis:  Principal Problem:   MDD (major depressive disorder), recurrent episode, severe ([place])   ED Assessment Time Calculation: Start Time: 1000 Stop Time: 1100 Total Time in Minutes (Assessment Completion): 60   HPI:   Sarah Cervantes is a 42 y.o. female patient  who presents to the Emergency Department complaining of fall, suicidal.  She presents to the ED by EMS from [facility] for for both fall/back pain and SI.  She states that she was in the shower and slipped on old soap and landed on her back and complains of diffuse back pain.  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  She cannot localize the pain in her back to 1 region over another.  She states she is also depressed and suicidal.  She states that she cannot afford her psychiatric medications and has been off of them for at least 5 days and she has a plan to overdose on pills.  She does have a prior suicide attempt by overdose in the past per patient.  She denies any tobacco, alcohol use.  She does use occasional marijuana.    Subjective:   [facility] seen at [facility] ED for face-to-face psychiatric assessment.  Patient tells me that she has an apartment and a roommate.  One of her friends stays at the [facility] and she was visiting her friend.  She states she spilled a drink on her and she decided to take a shower at the [facility] and bar her friends close.  However while in the [facility] shower she fell and hurt her back.  They then called 911 to bring her to the emergency department for evaluation after her fall and reported pain.  While being evaluated she did express suicidal ideations with a plan to overdose.  She has been using marijuana but denies any other illicit substances or alcohol use.  Today she  is still endorsing suicidal ideations, does not have a specific plan or intent.  She is unable to contract for safety stating " if I leave today I know I will do send that to myself.  There is no way I will live if I leave the hospital today."  Patient denies homicidal ideations.  Denies any auditory or visual hallucinations.  She does state being off her psychiatric medications for around 5 days.  She reported she cannot afford them and currently does not have an outpatient follow-up provider.  She reports poor sleep, and fluctuating appetite.  Will recommend inpatient psychiatric treatment due to patient not being able to contract for safety and further medication management.  Will restart previous medications of Abilify, Prozac, gabapentin, hydroxyzine, and trazodone.  She is agreeable with this plan.  Past Psychiatric History:  MDD, substance abuse  Risk to Self or Others: Is the patient at risk to self? Yes Has the patient been a risk to self in the past 6 months? No Has the patient been a risk to self within the distant past? Yes Is the patient a risk to others? No Has the patient been a risk to others in the past 6 months? No Has the patient been a risk to others within the distant past? No  [place] Scale:  [place] ED from 08/22/2022 in [facility] Emergency Department at [facility]  ED to Hosp-Admission (Discharged) from 08/05/2022 in [place] ED to Hosp-Admission (Discharged) from 07/16/2022 in [facility] UN[facility]  C-SSRS RISK CATEGORY High Risk Error: Q3, 4, or 5 should not be populated when Q2 is No Moderate Risk       ASAM: ASAM Multidimensional Assessment Summary Dimension 1:  Description of individual's past and current experiences of substance use and withdrawal: n/a Dimension 2:  Description of patient's biomedical conditions and  complications: n/a Dimension 2:  Biomedical Conditions and Complications Severity  Rating:  (n/a) Dimension 3:  Description of emotional, behavioral, or cognitive conditions and complications: n/a Dimension 3:  Emotional, behavioral or cognitive (EBC) conditions and complications severity rating:  (n/a) Dimension 4:  Description of Readiness to Change criteria: n/a Dimension 4:  Readiness to Change Severity Rating:  (n/a) Dimension 5:  Relapse, continued use, or continued problem potential critiera description: n/a Dimension 5:  Relapse, continued use, or continued problem potential severity rating:  (n/a) Dimension 6:  Recovery/Iiving environment criteria description: n/a Dimension 6:  Recovery/living environment severity rating:  (n/a) ASAM Recommended Level of Treatment:  (n/a)  Substance Abuse:  Alcohol / Drug Use Pain Medications: Denies abuse Prescriptions: Denies abuse Over the Counter: Denies abuse History of alcohol / drug use?: No history of alcohol / drug abuse Longest period of sobriety (when/how long): 10 months in 2022. Negative Consequences of Use: Personal relationships, Financial, Work / School Withdrawal Symptoms: None  Past Medical History:  Past Medical History:  Diagnosis Date   Cocaine abuse, daily use ([place]) 03/23/2018   ETOH abuse 03/23/2018   Heroin abuse ([place]) 03/23/2018   History reviewed. No pertinent surgical history. Family History: History reviewed. No pertinent family history. Family Psychiatric  History:   Social History:  Social History   Substance and Sexual Activity  Alcohol Use Yes   Alcohol/week: 12.0 - 15.0 standard drinks of alcohol   Types: 12 - 15 Cans of beer per week   Comment: drinking daily for the past week     Social History   Substance and Sexual Activity  Drug Use Yes   Types: Cocaine    Social History   Socioeconomic History   Marital status: Single    Spouse name: Not on file   Number of children: Not on file   Years of education: Not on file   Highest education level: Not on file  Occupational  History   Not on file  Tobacco Use   Smoking status: Every Day    Packs/day: 0.50    Years: 30.00    Additional pack years: 0.00    Total pack years: 15.00    Types: Cigarettes    Last attempt to quit: 01/12/2022    Years since quitting: 0.6   Smokeless tobacco: Never  Vaping Use   Vaping Use: Never used  Substance and Sexual Activity   Alcohol use: Yes    Alcohol/week: 12.0 - 15.0 standard drinks of alcohol    Types: 12 - 15 Cans of beer per week    Comment: drinking daily for the past week   Drug use: Yes    Types: Cocaine   Sexual activity: Yes    Birth control/protection: None  Other Topics Concern   Not on file  Social History Narrative   Not on file   Social Determinants of Health   Financial Resource Strain: Not on file  Food Insecurity: Food Insecurity Present (08/06/2022)   Hunger Vital Sign  Worried About Charity fundraiser in the Last Year: Sometimes true    [employer] of Food in the Last Year: Sometimes true  Transportation Needs: No Transportation Needs (08/06/2022)   PRAPARE - Hydrologist (Medical): No    Lack of Transportation (Non-Medical): No  Physical Activity: Not on file  Stress: Not on file  Social Connections: Not on file    Allergies:   Allergies  Allergen Reactions   Latex Swelling, Rash and Other (See Comments)    Hands swell    Labs:  Results for orders placed or performed during the hospital encounter of 08/22/22 (from the past 48 hour(s))  Comprehensive metabolic panel     Status: Abnormal   Collection Time: 08/22/22 12:00 AM  Result Value Ref Range   Sodium 135 135 - 145 mmol/L   Potassium 4.0 3.5 - 5.1 mmol/L   Chloride 103 98 - 111 mmol/L   CO2 23 22 - 32 mmol/L   Glucose, Bld 101 (H) 70 - 99 mg/dL    Comment: Glucose reference range applies only to samples taken after fasting for at least 8 hours.   BUN 14 6 - 20 mg/dL   Creatinine, Ser 0.83 0.44 - 1.00 mg/dL   Calcium 9.0 8.9 - 10.3 mg/dL    Total Protein 7.9 6.5 - 8.1 g/dL   Albumin 3.6 3.5 - 5.0 g/dL   AST 16 15 - 41 U/L   ALT 22 0 - 44 U/L   Alkaline Phosphatase 74 38 - 126 U/L   Total Bilirubin 0.6 0.3 - 1.2 mg/dL   GFR, Estimated >60 >60 mL/min    Comment: (NOTE) Calculated using the CKD-EPI Creatinine Equation (2021)    Anion gap 9 5 - 15    Comment: Performed at [facility] [address] [place], [place] [address]  Ethanol     Status: None   Collection Time: 08/22/22 12:00 AM  Result Value Ref Range   Alcohol, Ethyl (B) <10 <10 mg/dL    Comment: (NOTE) Lowest detectable limit for serum alcohol is 10 mg/dL.  For medical purposes only. Performed at [facility], [place] [address], [place], [place] [id number]   Salicylate level     Status: Abnormal   Collection Time: 08/22/22 12:00 AM  Result Value Ref Range   Salicylate Lvl Q000111Q (L) 7.0 - 30.0 mg/dL    Comment: Performed at [facility] [address] [place], [place] [address]  Acetaminophen level     Status: Abnormal   Collection Time: 08/22/22 12:00 AM  Result Value Ref Range   Acetaminophen (Tylenol), Serum <10 (L) 10 - 30 ug/mL    Comment: (NOTE) Therapeutic concentrations vary significantly. A range of 10-30 ug/mL  may be an effective concentration for many patients. However, some  are best treated at concentrations outside of this range. Acetaminophen concentrations >150 ug/mL at 4 hours after ingestion  and >50 ug/mL at 12 hours after ingestion are often associated with  toxic reactions.  Performed at [facility], [place] [address] [place],  [address]   cbc     Status: Abnormal   Collection Time: 08/22/22 12:00 AM  Result Value Ref Range   WBC 13.1 (H) 4.0 - 10.5 K/uL   RBC 5.10 3.87 - 5.11 MIL/uL   Hemoglobin 14.8 12.0 - 15.0 g/dL   HCT 47.1 (H) 36.0 - 46.0 %   MCV 92.4 80.0 - 100.0 fL  MCH 29.0 26.0 - 34.0 pg   MCHC 31.4 30.0 - 36.0 g/dL   RDW 15.7 (H) 11.5 - 15.5 %   Platelets 385 150 - 400 K/uL    nRBC 0.0 0.0 - 0.2 %    Comment: Performed at [facility], [place] [address], [place], [place] [address]  I-Stat beta hCG blood, ED     Status: None   Collection Time: 08/22/22  2:52 AM  Result Value Ref Range   I-stat hCG, quantitative <5.0 <5 mIU/mL   Comment 3            Comment:   GEST. AGE      CONC.  (mIU/mL)   <=1 WEEK        5 - 50     2 WEEKS       50 - 500     3 WEEKS       100 - 10,000     4 WEEKS     1,000 - 30,000        FEMALE AND NON-PREGNANT FEMALE:     LESS THAN 5 mIU/mL     No current facility-administered medications for this encounter.   Current Outpatient Medications  Medication Sig Dispense Refill   acetaminophen (TYLENOL) 325 MG tablet Take 2 tablets (650 mg total) by mouth every 6 (six) hours as needed for mild pain (or Fever >/= 101). 20 tablet 0   albuterol (VENTOLIN HFA) 108 (90 Base) MCG/ACT inhaler Inhale 2 puffs into the lungs every 6 (six) hours as needed for wheezing or shortness of breath.     amLODipine (NORVASC) 5 MG tablet Take 1 tablet (5 mg total) by mouth daily. 30 tablet 2   ARIPiprazole (ABILIFY) 5 MG tablet Take 2 1/2 tablets (12.5 mg total) by mouth daily. 75 tablet 0   benzocaine (ORAJEL) 10 % mucosal gel Use as directed in the mouth or throat as needed for mouth pain (Tooth pain). 7 g 0   budesonide-formoterol (SYMBICORT) 80-4.5 MCG/ACT inhaler Inhale 2 puffs into the lungs 2 (two) times daily.     butalbital-acetaminophen-caffeine (FIORICET) 50-325-40 MG tablet Take 1 tablet by mouth every 4 (four) hours as needed for headache.     diclofenac Sodium (VOLTAREN) 1 % GEL Apply 2 g topically 4 (four) times daily as needed (KNEE PAIN). 100 g 0   FLUoxetine (PROZAC) 40 MG capsule Take 1 capsule (40 mg total) by mouth daily. 30 capsule 2   gabapentin (NEURONTIN) 400 MG capsule Take 2 capsules (800 mg total) by mouth 3 (three) times daily. 180 capsule 2   hydrOXYzine (ATARAX) 50 MG tablet Take 1 tablet (50 mg total) by mouth 3 (three) times  daily as needed for anxiety. 30 tablet 0   ibuprofen (ADVIL) 200 MG tablet Take 2 tablets (400 mg total) by mouth every 8 (eight) hours as needed for headache or mild pain. 30 tablet 0   lidocaine (LIDODERM) 5 % Place 1 patch onto the skin daily. Remove & Discard patch within 12 hours or as directed by MD (Patient taking differently: Place 2 patches onto the skin daily. Remove & Discard patch within 12 hours or as directed by MD) 30 patch 0   lisinopril (ZESTRIL) 10 MG tablet Take 1 tablet (10 mg total) by mouth in the morning. 30 tablet 2   OZEMPIC, 0.25 OR 0.5 MG/DOSE, 2 MG/1.5ML SOPN Inject 0.5 mg into the skin every Monday.     pantoprazole (PROTONIX) 40 MG tablet  Take 1 tablet (40 mg total) by mouth daily. 30 tablet 2   polyethylene glycol (MIRALAX / GLYCOLAX) 17 g packet Take 17 g by mouth daily as needed for mild constipation. 14 each 0   senna (SENOKOT) 8.6 MG TABS tablet Take 1 tablet (8.6 mg total) by mouth daily. 30 tablet 0   traMADol (ULTRAM) 50 MG tablet Take 2 tablets (100 mg total) by mouth every 6 (six) hours as needed for moderate pain. 20 tablet 0   traZODone (DESYREL) 100 MG tablet Take 1 tablet (100 mg total) by mouth at bedtime. 30 tablet 2    Psychiatric Specialty Exam: Presentation  General Appearance:  Appropriate for Environment  Eye Contact: Fair  Speech: Clear and Coherent  Speech Volume: Normal  Handedness: Right   Mood and Affect  Mood: Depressed; Hopeless  Affect: Congruent   Thought Process  Thought Processes: Coherent  Descriptions of Associations:Intact  Orientation:Full (Time, Place and Person)  Thought Content:WDL  History of Schizophrenia/Schizoaffective disorder:No  Duration of Psychotic Symptoms:N/A  Hallucinations:Hallucinations: None  Ideas of Reference:None  Suicidal Thoughts:Suicidal Thoughts: Yes, Passive SI Passive Intent and/or Plan: Without Intent; Without Plan  Homicidal Thoughts:Homicidal Thoughts:  No   Sensorium  Memory: Immediate Fair; Recent Fair  Judgment: Fair  Insight: Fair   Executive Functions  Concentration: Good  Attention Span: Good  Recall: Good  Fund of Knowledge: Good  Language: Good   Psychomotor Activity  Psychomotor Activity: Psychomotor Activity: Normal   Assets  Assets: Desire for Improvement; Leisure Time; Physical Health; Resilience    Sleep  Sleep: Sleep: Fair   Physical Exam: Physical Exam Neurological:     Mental Status: She is alert and oriented to person, place, and time.  Psychiatric:        Attention and Perception: Attention normal.        Mood and Affect: Mood is depressed.        Speech: Speech normal.        Behavior: Behavior is cooperative.        Thought Content: Thought content includes suicidal ideation.    Review of Systems  Musculoskeletal:  Positive for back pain.  Psychiatric/Behavioral:  Positive for depression and suicidal ideas. The patient is nervous/anxious and has insomnia.   All other systems reviewed and are negative.  Blood pressure (!) 146/89, pulse 76, temperature 98.4 F (36.9 C), temperature source Oral, resp. rate 20, height 5\' 4"  (1.626 m), weight (!) 143.3 kg, last menstrual period 07/01/2021, SpO2 97 %. Body mass index is 54.24 kg/m.  Medical Decision Making: Patient case reviewed and discussed with Dr. Dwyane Dee.  Patient unable to contract for safety at this time, continues to endorse suicidal ideations.  Will recommend inpatient psychiatric treatment.  Patient currently under review at [facility], if no availability CSW will fax out.    Disposition: Recommend psychiatric Inpatient admission when medically cleared.  Vesta Mixer, NP 08/22/2022 10:28 AM

## 2022-08-22 NOTE — ED Notes (Signed)
Report attempted, no answer on numerous numbers will re-attempt.

## 2022-08-22 NOTE — Progress Notes (Signed)
CSW spoke with Admission Lonia Mad with [facility] who informed that the phone lines were down and advised to try this number for report [phone] around 5:30pm. CSW updated nursing staff.    Benjaman Kindler, MSW, [facility] 08/22/2022 7:55 PM

## 2022-08-22 NOTE — ED Notes (Signed)
Patient ambulated to the bathroom with steady gait.
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
## 2022-08-22 NOTE — Progress Notes (Signed)
LCSW Progress Note  [id number]   Sarah Cervantes  08/22/2022  11:18 AM  Description:   Inpatient Psychiatric Referral  Patient was recommended inpatient per Vesta Mixer, NP. There are no available beds at [facility] at this time. Patient was referred to the following facilities:   Destination  Service Provider Address Phone Fax  [place]  [address], [place] [address] [phone]  [address] [place] [address] [phone]  [facility]  [address] [place] [address] [place]  [facility]  [address], [place] [address] [phone]  [place]  [place], [place] [address] [phone]         [facility]  [id number] [place] [address] [phone] [facility]  [address] [place], [place] [address] [phone] [address] [place] [address] [place]  [facility]  [address] [place] [address] [phone] [place] [address] [phone]  [facility]  [address] [place] [address] [phone] [place]  [address], [place] [address] [phone] [facility]  [address] [place] [address] [phone] [facility]  [address], [place] [address] [phone]  [facility]  [address] [place], [place] [address] [phone]  [facility]  [address], [place] [address]  [phone] [place]  [address], [place] [address] [phone]  [address] [place] [address] [phone]  [facility]  [address] [place] [address] [phone]  [facility]  [address] [place] [address] [phone]  [place], [place] [phone] [phone] [place]  [place], [place] [address] [phone] [facility]  [place], [place] [address] [phone]  [facility]  [address] [place] [address] [phone] [place]  [address], [place] [address] [phone]  [facility]  [address] [place] [address] [phone]    Situation ongoing, CSW to continue following and update chart as more information becomes available.      Denna Haggard, [place]  08/22/2022 11:18 AM

## 2022-08-22 NOTE — ED Notes (Signed)
Assumed care of pt placed in hall bed 12. Pt ARRIVED VIA EMS FROM [facility] after fall c/o back pain and claiming to be suicidal with a plan to injest all her prescribed meds at once. Pt a/o x 4 calm cooperative compliant aware she needs to provide urine sample. Unable to place in purple scrubs due to size belongings at nurses station.

## 2022-08-22 NOTE — ED Triage Notes (Addendum)
Pt brought in by [facility] from [facility] for suicidal thoughts with plan of overdosing on pills. Pt states she has been off psych medications for 5 days. Pt also states she fell in the shower at approx 2300 tonight and is experiencing lower/mid back pain. Denies head trauma, loc, or blood thinners.   BP 146/86, HR 82, Spow 94%, RR 22

## 2022-08-22 NOTE — ED Notes (Signed)
Vol/consent form attached to the clip board in orange zone

## 2022-08-22 NOTE — ED Notes (Signed)
Vol/consent form attached to the clip board in purple zone

## 2022-08-22 NOTE — ED Notes (Signed)
Report attempted, no answer, tried numerous numbers. Providers aware.

## 2022-08-22 NOTE — ED Notes (Signed)
Pt belongings inventoried and placed in locker * 8 in purple

## 2022-08-22 NOTE — Progress Notes (Signed)
Pt was accepted to [facility] 08/22/2022. Bed assignment: [place] unit  Pt meets inpatient criteria per Vesta Mixer, NP  Attending Physician will be Cammie Mcgee, AGPCNP  Report can be called to: [phone]  Pt can arrive after 4 PM  Care Team Notified: Lynnda Shields, RN, Vesta Mixer, NP, and Terri Piedra, RN  [place], [place]  08/22/2022 12:10 PM

## 2022-08-25 ENCOUNTER — Other Ambulatory Visit (HOSPITAL_COMMUNITY): Payer: Self-pay

## 2022-09-19 ENCOUNTER — Telehealth: Payer: Self-pay | Admitting: Physician Assistant

## 2022-09-19 ENCOUNTER — Encounter: Payer: Self-pay | Admitting: Physician Assistant

## 2022-09-19 DIAGNOSIS — R768 Other specified abnormal immunological findings in serum: Secondary | ICD-10-CM | POA: Insufficient documentation

## 2022-09-19 DIAGNOSIS — F17210 Nicotine dependence, cigarettes, uncomplicated: Secondary | ICD-10-CM

## 2022-09-19 DIAGNOSIS — F3341 Major depressive disorder, recurrent, in partial remission: Secondary | ICD-10-CM

## 2022-09-19 DIAGNOSIS — M5442 Lumbago with sciatica, left side: Secondary | ICD-10-CM

## 2022-09-19 DIAGNOSIS — J4489 Other specified chronic obstructive pulmonary disease: Secondary | ICD-10-CM

## 2022-09-19 DIAGNOSIS — I10 Essential (primary) hypertension: Secondary | ICD-10-CM

## 2022-09-19 DIAGNOSIS — F1010 Alcohol abuse, uncomplicated: Secondary | ICD-10-CM | POA: Insufficient documentation

## 2022-09-19 DIAGNOSIS — F1410 Cocaine abuse, uncomplicated: Secondary | ICD-10-CM

## 2022-09-19 DIAGNOSIS — G8929 Other chronic pain: Secondary | ICD-10-CM | POA: Insufficient documentation

## 2022-09-19 MED ORDER — CYCLOBENZAPRINE HCL 10 MG PO TABS: 10.0000 mg | ORAL_TABLET | Freq: Three times a day (TID) | ORAL | 0 refills | Status: DC | PRN

## 2022-09-19 MED ORDER — GABAPENTIN 400 MG PO CAPS: 800.0000 mg | ORAL_CAPSULE | Freq: Three times a day (TID) | ORAL | 2 refills | Status: DC

## 2022-09-19 MED ORDER — PREDNISONE 20 MG PO TABS: ORAL_TABLET | ORAL | 0 refills | Status: AC

## 2022-09-19 MED ORDER — LISINOPRIL 10 MG PO TABS: 10.0000 mg | ORAL_TABLET | Freq: Every morning | ORAL | 2 refills | Status: DC

## 2022-09-19 MED ORDER — AMLODIPINE BESYLATE 5 MG PO TABS: 5.0000 mg | ORAL_TABLET | Freq: Every day | ORAL | 2 refills | Status: DC

## 2022-09-19 MED ORDER — HYDROXYZINE HCL 50 MG PO TABS: 50.0000 mg | ORAL_TABLET | Freq: Three times a day (TID) | ORAL | 0 refills | Status: DC | PRN

## 2022-09-19 NOTE — Progress Notes (Signed)
New Patient Office Visit  Subjective    Patient ID: Sarah Cervantes, female    DOB: 10/27/1980  Age: 42 y.o. MRN: [id number]  CC:  Chief Complaint  Patient presents with   Arthritis   Medication Refill  Virtual Visit via Video Note  I connected with Tera Helper on 09/19/22 at 10:40 AM EDT by a video enabled telemedicine application and verified that I am speaking with the correct person using two identifiers.  Unable to connect to video due to technical difficulties, patient agreeable to continue through telephone  Location: Patient: [facility] center Provider: [facility]    I discussed the limitations of evaluation and management by telemedicine and the availability of in person appointments. The patient expressed understanding and agreed to proceed.  History of Present Illness:  Sarah Cervantes states that she is currently being treated for substance abuse at [facility] residential treatment center, states that she arrived on April 10 and does plan on leaving in a couple of weeks to a long-term care in [facility].  States that she has been experiencing low back pain and tingling down her left leg.  States that she has significant history of chronic arthritis and sciatica.  States that she did experience a fall prior to her arrival at [facility].  States that she was seen in the emergency room for that visit.  States that she was given a prescription NSAID that is not offering relief and states that Voltaren is not offering relief.  States it is difficult for her to sit very long or stand very long  States that she was started on amlodipine approximately 1 month ago, states that she does not check her blood pressure at home.  Denies hypertensive symptoms.  States that she was told approximately 1 month ago that she has hepatitis C, has not had treatment.  Observations/Objective: Medical history and current medications  reviewed, no physical exam completed   Outpatient Encounter Medications as of 09/19/2022  Medication Sig   cyclobenzaprine (FLEXERIL) 10 MG tablet Take 1 tablet (10 mg total) by mouth 3 (three) times daily as needed for muscle spasms.   predniSONE (DELTASONE) 20 MG tablet Take 3 tablets (60 mg total) by mouth daily with breakfast for 2 days, THEN 2 tablets (40 mg total) daily with breakfast for 2 days, THEN 1 tablet (20 mg total) daily with breakfast for 2 days, THEN 0.5 tablets (10 mg total) daily with breakfast for 2 days.   albuterol (VENTOLIN HFA) 108 (90 Base) MCG/ACT inhaler Inhale 2 puffs into the lungs every 6 (six) hours as needed for wheezing or shortness of breath.   amLODipine (NORVASC) 5 MG tablet Take 1 tablet (5 mg total) by mouth daily.   gabapentin (NEURONTIN) 400 MG capsule Take 2 capsules (800 mg total) by mouth 3 (three) times daily.   hydrOXYzine (ATARAX) 50 MG tablet Take 1 tablet (50 mg total) by mouth 3 (three) times daily as needed for anxiety.   lisinopril (ZESTRIL) 10 MG tablet Take 1 tablet (10 mg total) by mouth in the morning.   [DISCONTINUED] acetaminophen (TYLENOL) 325 MG tablet Take 2 tablets (650 mg total) by mouth every 6 (six) hours as needed for mild pain (or Fever >/= 101).   [DISCONTINUED] amLODipine (NORVASC) 5 MG tablet Take 1 tablet (5 mg total) by mouth daily.   [DISCONTINUED] ARIPiprazole (ABILIFY) 5 MG tablet Take 2 1/2 tablets (12.5 mg total) by mouth daily.   [DISCONTINUED] benzocaine (ORAJEL)  10 % mucosal gel Use as directed in the mouth or throat as needed for mouth pain (Tooth pain).   [DISCONTINUED] budesonide-formoterol (SYMBICORT) 80-4.5 MCG/ACT inhaler Inhale 2 puffs into the lungs 2 (two) times daily.   [DISCONTINUED] butalbital-acetaminophen-caffeine (FIORICET) 50-325-40 MG tablet Take 1 tablet by mouth every 4 (four) hours as needed for headache.   [DISCONTINUED] diclofenac Sodium (VOLTAREN) 1 % GEL Apply 2 g topically 4 (four) times daily as  needed (KNEE PAIN).   [DISCONTINUED] FLUoxetine (PROZAC) 40 MG capsule Take 1 capsule (40 mg total) by mouth daily.   [DISCONTINUED] gabapentin (NEURONTIN) 400 MG capsule Take 2 capsules (800 mg total) by mouth 3 (three) times daily.   [DISCONTINUED] hydrOXYzine (ATARAX) 50 MG tablet Take 1 tablet (50 mg total) by mouth 3 (three) times daily as needed for anxiety.   [DISCONTINUED] ibuprofen (ADVIL) 200 MG tablet Take 2 tablets (400 mg total) by mouth every 8 (eight) hours as needed for headache or mild pain.   [DISCONTINUED] lidocaine (LIDODERM) 5 % Place 1 patch onto the skin daily. Remove & Discard patch within 12 hours or as directed by MD (Patient taking differently: Place 2 patches onto the skin daily. Remove & Discard patch within 12 hours or as directed by MD)   [DISCONTINUED] lisinopril (ZESTRIL) 10 MG tablet Take 1 tablet (10 mg total) by mouth in the morning.   [DISCONTINUED] OZEMPIC, 0.25 OR 0.5 MG/DOSE, 2 MG/1.5ML SOPN Inject 0.5 mg into the skin every Monday.   [DISCONTINUED] pantoprazole (PROTONIX) 40 MG tablet Take 1 tablet (40 mg total) by mouth daily.   [DISCONTINUED] polyethylene glycol (MIRALAX / GLYCOLAX) 17 g packet Take 17 g by mouth daily as needed for mild constipation.   [DISCONTINUED] senna (SENOKOT) 8.6 MG TABS tablet Take 1 tablet (8.6 mg total) by mouth daily.   [DISCONTINUED] traMADol (ULTRAM) 50 MG tablet Take 2 tablets (100 mg total) by mouth every 6 (six) hours as needed for moderate pain.   [DISCONTINUED] traZODone (DESYREL) 100 MG tablet Take 1 tablet (100 mg total) by mouth at bedtime.   No facility-administered encounter medications on file as of 09/19/2022.    Past Medical History:  Diagnosis Date   Cocaine abuse, daily use 03/23/2018   ETOH abuse 03/23/2018   Heroin abuse 03/23/2018    History reviewed. No pertinent surgical history.  History reviewed. No pertinent family history.  Social History   Socioeconomic History   Marital status: Single     Spouse name: Not on file   Number of children: Not on file   Years of education: Not on file   Highest education level: Not on file  Occupational History   Not on file  Tobacco Use   Smoking status: Every Day    Packs/day: 0.50    Years: 30.00    Additional pack years: 0.00    Total pack years: 15.00    Types: Cigarettes    Last attempt to quit: 01/12/2022    Years since quitting: 0.6   Smokeless tobacco: Never  Vaping Use   Vaping Use: Never used  Substance and Sexual Activity   Alcohol use: Yes    Alcohol/week: 12.0 - 15.0 standard drinks of alcohol    Types: 12 - 15 Cans of beer per week    Comment: drinking daily for the past week   Drug use: Yes    Types: Cocaine   Sexual activity: Yes    Birth control/protection: None  Other Topics Concern   Not on file  Social History Narrative   Not on file   Social Determinants of Health   Financial Resource Strain: Not on file  Food Insecurity: Food Insecurity Present (08/06/2022)   Hunger Vital Sign    Worried About Running Out of Food in the Last Year: Sometimes true    Ran Out of Food in the Last Year: Sometimes true  Transportation Needs: No Transportation Needs (08/06/2022)   PRAPARE - Administrator, Civil Service (Medical): No    Lack of Transportation (Non-Medical): No  Physical Activity: Not on file  Stress: Not on file  Social Connections: Not on file  Intimate Partner Violence: Not At Risk (08/06/2022)   Humiliation, Afraid, Rape, and Kick questionnaire    Fear of Current or Ex-Partner: No    Emotionally Abused: No    Physically Abused: No    Sexually Abused: No    Review of Systems  Constitutional:  Negative for chills and fever.  HENT: Negative.    Eyes: Negative.   Respiratory:  Negative for shortness of breath.   Cardiovascular:  Negative for chest pain.  Gastrointestinal: Negative.   Genitourinary: Negative.   Musculoskeletal:  Positive for back pain and myalgias.  Skin: Negative.    Neurological: Negative.   Endo/Heme/Allergies: Negative.   Psychiatric/Behavioral:  Negative for depression. The patient is not nervous/anxious and does not have insomnia.           Assessment & Plan:   Problem List Items Addressed This Visit       Cardiovascular and Mediastinum   Essential hypertension   Relevant Medications   lisinopril (ZESTRIL) 10 MG tablet   amLODipine (NORVASC) 5 MG tablet     Respiratory   COPD with asthma   Relevant Medications   predniSONE (DELTASONE) 20 MG tablet     Nervous and Auditory   Chronic left-sided low back pain with left-sided sciatica - Primary   Relevant Medications   gabapentin (NEURONTIN) 400 MG capsule   hydrOXYzine (ATARAX) 50 MG tablet   predniSONE (DELTASONE) 20 MG tablet   cyclobenzaprine (FLEXERIL) 10 MG tablet     Other   Cocaine use disorder   Recurrent major depressive disorder, in partial remission   Relevant Medications   hydrOXYzine (ATARAX) 50 MG tablet   Hepatitis C antibody positive in blood   Alcohol abuse  Assessment and Plan: 1. Recurrent major depressive disorder, in partial remission Continue current regimen.  Patient to follow-up with mobile unit in 2 weeks. - hydrOXYzine (ATARAX) 50 MG tablet; Take 1 tablet (50 mg total) by mouth 3 (three) times daily as needed for anxiety.  Dispense: 30 tablet; Refill: 0  2. Essential hypertension Continue current regimen, patient encouraged to check blood pressure on a daily basis, keep a written log and have available for all office visits.  Red flags given for prompt reevaluation - lisinopril (ZESTRIL) 10 MG tablet; Take 1 tablet (10 mg total) by mouth in the morning.  Dispense: 30 tablet; Refill: 2 - amLODipine (NORVASC) 5 MG tablet; Take 1 tablet (5 mg total) by mouth daily.  Dispense: 30 tablet; Refill: 2  3. Chronic left-sided low back pain with left-sided sciatica Discontinue nabumetone.  Continue gabapentin.  Trial prednisone taper, trial Flexeril.  Patient  education given on supportive care - gabapentin (NEURONTIN) 400 MG capsule; Take 2 capsules (800 mg total) by mouth 3 (three) times daily.  Dispense: 180 capsule; Refill: 2 - predniSONE (DELTASONE) 20 MG tablet; Take 3 tablets (60 mg total) by mouth  daily with breakfast for 2 days, THEN 2 tablets (40 mg total) daily with breakfast for 2 days, THEN 1 tablet (20 mg total) daily with breakfast for 2 days, THEN 0.5 tablets (10 mg total) daily with breakfast for 2 days.  Dispense: 13 tablet; Refill: 0 - cyclobenzaprine (FLEXERIL) 10 MG tablet; Take 1 tablet (10 mg total) by mouth 3 (three) times daily as needed for muscle spasms.  Dispense: 30 tablet; Refill: 0  4. Hepatitis C antibody positive in blood On review of labs, patient tested positive for antibody in 2020, RNA not detected.  5. COPD with asthma Continue current regimen no refill needed today  6. Alcohol abuse Currently in substance abuse treatment program  7. Cocaine use disorder  No AVS created, patient currently not able to access MyChart.  Patient education given through teach back method.   Follow Up Instructions:    I discussed the assessment and treatment plan with the patient. The patient was provided an opportunity to ask questions and all were answered. The patient agreed with the plan and demonstrated an understanding of the instructions.   The patient was advised to call back or seek an in-person evaluation if the symptoms worsen or if the condition fails to improve as anticipated.  I provided 21 minutes of non-face-to-face time during this encounter.     Return in about 2 weeks (around 10/03/2022).   Kasandra Knudsen Mayers, PA-C

## 2022-09-25 ENCOUNTER — Emergency Department (HOSPITAL_COMMUNITY): Payer: Self-pay

## 2022-09-25 ENCOUNTER — Other Ambulatory Visit (HOSPITAL_COMMUNITY): Payer: Self-pay

## 2022-09-25 ENCOUNTER — Emergency Department (HOSPITAL_COMMUNITY)
Admission: EM | Admit: 2022-09-25 | Discharge: 2022-09-25 | Disposition: A | Discharge status: Home / Self Care | Payer: Self-pay | Attending: Emergency Medicine | Admitting: Emergency Medicine

## 2022-09-25 ENCOUNTER — Encounter (HOSPITAL_COMMUNITY): Payer: Self-pay

## 2022-09-25 DIAGNOSIS — F17210 Nicotine dependence, cigarettes, uncomplicated: Secondary | ICD-10-CM | POA: Insufficient documentation

## 2022-09-25 DIAGNOSIS — D72829 Elevated white blood cell count, unspecified: Secondary | ICD-10-CM | POA: Insufficient documentation

## 2022-09-25 DIAGNOSIS — Z91040 Latex allergy status: Secondary | ICD-10-CM | POA: Insufficient documentation

## 2022-09-25 DIAGNOSIS — R519 Headache, unspecified: Secondary | ICD-10-CM | POA: Insufficient documentation

## 2022-09-25 DIAGNOSIS — R200 Anesthesia of skin: Secondary | ICD-10-CM | POA: Insufficient documentation

## 2022-09-25 DIAGNOSIS — R42 Dizziness and giddiness: Secondary | ICD-10-CM | POA: Insufficient documentation

## 2022-09-25 LAB — CBC
HCT: 39.3 % (ref 36.0–46.0)
Hemoglobin: 12.8 g/dL (ref 12.0–15.0)
MCH: 29.8 pg (ref 26.0–34.0)
MCHC: 32.6 g/dL (ref 30.0–36.0)
MCV: 91.6 fL (ref 80.0–100.0)
Platelets: 324 10*3/uL (ref 150–400)
RBC: 4.29 MIL/uL (ref 3.87–5.11)
RDW: 15.8 % — ABNORMAL HIGH (ref 11.5–15.5)
WBC: 14.1 10*3/uL — ABNORMAL HIGH (ref 4.0–10.5)
nRBC: 0 % (ref 0.0–0.2)

## 2022-09-25 LAB — BASIC METABOLIC PANEL
Anion gap: 10 (ref 5–15)
BUN: 22 mg/dL — ABNORMAL HIGH (ref 6–20)
CO2: 24 mmol/L (ref 22–32)
Calcium: 8.4 mg/dL — ABNORMAL LOW (ref 8.9–10.3)
Chloride: 105 mmol/L (ref 98–111)
Creatinine, Ser: 0.76 mg/dL (ref 0.44–1.00)
GFR, Estimated: >60 mL/min (ref >60)
Glucose, Bld: 86 mg/dL (ref 70–99)
Potassium: 4.2 mmol/L (ref 3.5–5.1)
Sodium: 139 mmol/L (ref 135–145)

## 2022-09-25 MED ORDER — SODIUM CHLORIDE 0.9 % IV BOLUS
1000.0000 mL | Freq: Once | INTRAVENOUS | Status: AC
  Administered 2022-09-25: 1000 mL via INTRAVENOUS

## 2022-09-25 MED ORDER — MECLIZINE HCL 12.5 MG PO TABS
12.5000 mg | ORAL_TABLET | Freq: Three times a day (TID) | ORAL | 0 refills | Status: AC | PRN
  Filled 2022-09-25: qty 30, 10d supply, fill #0

## 2022-09-25 MED ORDER — KETOROLAC TROMETHAMINE 15 MG/ML IJ SOLN
15.0000 mg | Freq: Once | INTRAMUSCULAR | Status: AC
  Administered 2022-09-25: 15 mg via INTRAVENOUS
  Filled 2022-09-25: qty 1

## 2022-09-25 MED ORDER — DIPHENHYDRAMINE HCL 50 MG/ML IJ SOLN
25.0000 mg | Freq: Once | INTRAMUSCULAR | Status: AC
  Administered 2022-09-25: 25 mg via INTRAVENOUS
  Filled 2022-09-25: qty 1

## 2022-09-25 MED ORDER — METOCLOPRAMIDE HCL 5 MG/ML IJ SOLN
10.0000 mg | Freq: Once | INTRAMUSCULAR | Status: AC
  Administered 2022-09-25: 10 mg via INTRAVENOUS
  Filled 2022-09-25: qty 2

## 2022-09-25 NOTE — Discharge Instructions (Addendum)
Note the workup today was overall reassuring.  As discussed, we will send a medication to use as needed for feelings of dizziness.  Recommend follow-up with neurology for reassessment of your symptoms of headache as well as lower extremity numbness.  I think that your numbness is most likely secondary due to mechanical compression of nerves in your legs given improvement with walking.  Recommend continued walking to help relieve symptoms.  Please do not hesitate to return to the emergency department if the worrisome signs and symptoms we discussed become apparent.

## 2022-09-25 NOTE — ED Triage Notes (Signed)
Pt BIB [facility] from [facility] with c/o a headache, n/v that started this morning. Pt also c/o of bilateral leg numbness that started several 3 days ago. Pain 7/10

## 2022-09-25 NOTE — ED Provider Notes (Signed)
[place] EMERGENCY DEPARTMENT AT [facility] Provider Note   CSN: [id number] Arrival date & time: 09/25/22  0865     History  Chief Complaint  Patient presents with   Headache    n   Nausea   Emesis    Ane Method is a 42 y.o. female.   Headache Associated symptoms: vomiting   Emesis Associated symptoms: headaches     42 year old female presents emergency department with complaints of bilateral leg numbness, headache and dizziness.  Patient states that she has been with intermittent lower extremity numbness for the past week.  States that symptoms are worsened when sitting in 1 position for prolonged period of time and symptoms resolved when she ambulates.  States that she is feeling symptoms minimally while in exam bed.  Denies any known trauma to affected extremities.  She also complains of frontal headache of which states feels similar to headache she has had in the past but slightly more intense.  Reports some associated feelings of dizziness described as room spinning type sensation.  Notes acute onset of symptoms when she was getting food earlier today at [facility] in the cafeteria.  Reports exacerbation of dizziness when she changes positions and relief when she stands/sits still.  States that she typically has associated dizziness with headaches at baseline of which is relieved with Motrin but she was unable to take Motrin before presenting to the emergency department today.  Reports some associated nausea with 1-2 bouts of emesis earlier today.  Denies fever, visual disturbance, gait abnormality from baseline, weakness in upper or lower extremities, slurred speech, facial droop, anticoagulation.  Denies chest pain, shortness of breath, abdominal pain, urinary symptoms, change in bowel habits.  Past medical history significant for polysubstance use/abuse, bipolar 1 disorder, GERD, COPD, obesity, hypertension,  Home Medications Prior to Admission medications    Medication Sig Start Date End Date Taking? Authorizing Provider  meclizine (ANTIVERT) 12.5 MG tablet Take 1 tablet (12.5 mg total) by mouth 3 (three) times daily as needed for dizziness. 09/25/22  Yes Sherian Maroon A, PA  albuterol (VENTOLIN HFA) 108 (90 Base) MCG/ACT inhaler Inhale 2 puffs into the lungs every 6 (six) hours as needed for wheezing or shortness of breath.    [provider]  amLODipine (NORVASC) 5 MG tablet Take 1 tablet (5 mg total) by mouth daily. 09/19/22 12/18/22  Mayers, Cari S, PA-C  cyclobenzaprine (FLEXERIL) 10 MG tablet Take 1 tablet (10 mg total) by mouth 3 (three) times daily as needed for muscle spasms. 09/19/22   Mayers, Cari S, PA-C  gabapentin (NEURONTIN) 400 MG capsule Take 2 capsules (800 mg total) by mouth 3 (three) times daily. 09/19/22 12/18/22  Mayers, Cari S, PA-C  hydrOXYzine (ATARAX) 50 MG tablet Take 1 tablet (50 mg total) by mouth 3 (three) times daily as needed for anxiety. 09/19/22   Mayers, Cari S, PA-C  lisinopril (ZESTRIL) 10 MG tablet Take 1 tablet (10 mg total) by mouth in the morning. 09/19/22 12/18/22  Mayers, Cari S, PA-C  predniSONE (DELTASONE) 20 MG tablet Take 3 tablets (60 mg total) by mouth daily with breakfast for 2 days, THEN 2 tablets (40 mg total) daily with breakfast for 2 days, THEN 1 tablet (20 mg total) daily with breakfast for 2 days, THEN 0.5 tablets (10 mg total) daily with breakfast for 2 days. 09/19/22 09/26/22  Mayers, Kasandra Knudsen, PA-C      Allergies    Latex    Review of Systems  Review of Systems  Gastrointestinal:  Positive for vomiting.  Neurological:  Positive for headaches.  All other systems reviewed and are negative.   Physical Exam Updated Vital Signs BP 124/79   Pulse 77   Temp 97.7 F (36.5 C) (Oral)   Resp 18   LMP 07/01/2021   SpO2 95%  Physical Exam Vitals and nursing note reviewed.  Constitutional:      General: She is not in acute distress.    Appearance: She is well-developed.  HENT:     Head:  Normocephalic and atraumatic.  Eyes:     Conjunctiva/sclera: Conjunctivae normal.  Cardiovascular:     Rate and Rhythm: Normal rate and regular rhythm.     Pulses: Normal pulses.     Heart sounds: No murmur heard. Pulmonary:     Effort: Pulmonary effort is normal. No respiratory distress.     Breath sounds: Normal breath sounds. No wheezing or rales.  Abdominal:     Palpations: Abdomen is soft.     Tenderness: There is no abdominal tenderness.  Musculoskeletal:        General: No swelling.     Cervical back: Neck supple.     Right lower leg: No edema.     Left lower leg: No edema.     Comments: No tenderness to palpation along the lower extremities.  Patient has full range of motion bilateral hips, knees, ankles, digits.  Pedal and posterior tibial pulses 2+ bilaterally.  No lower extremity edema.  Skin:    General: Skin is warm and dry.     Capillary Refill: Capillary refill takes less than 2 seconds.  Neurological:     Mental Status: She is alert.     Comments: Alert and oriented to self, place, time and event.   Speech is fluent, clear without dysarthria or dysphasia.   Strength 5/5 in upper/lower extremities   Sensation intact in upper extremities.   Normal gait after reassessment of migraine cocktail. No pronator drift.  Normal finger-to-nose and feet tapping.  CN I not tested  CN II not tested CN III, IV, VI PERRLA and EOMs intact bilaterally.  Leftward beating horizontal nystagmus appreciated during EOMs. CN V Intact sensation to sharp and light touch to the face  CN VII facial movements symmetric  CN VIII not tested  CN IX, X no uvula deviation, symmetric rise of soft palate  CN XI 5/5 SCM and trapezius strength bilaterally  CN XII Midline tongue protrusion, symmetric L/R movements     Psychiatric:        Mood and Affect: Mood normal.     ED Results / Procedures / Treatments   Labs (all labs ordered are listed, but only abnormal results are displayed) Labs  Reviewed  BASIC METABOLIC PANEL - Abnormal; Notable for the following components:      Result Value   BUN 22 (*)    Calcium 8.4 (*)    All other components within normal limits  CBC - Abnormal; Notable for the following components:   WBC 14.1 (*)    RDW 15.8 (*)    All other components within normal limits  I-STAT BETA HCG BLOOD, ED ([facility], [facility], [facility] ONLY)    EKG None  Radiology CT Head Wo Contrast  Result Date: 09/25/2022 CLINICAL DATA:  Headache EXAM: CT HEAD WITHOUT CONTRAST TECHNIQUE: Contiguous axial images were obtained from the base of the skull through the vertex without intravenous contrast. RADIATION DOSE REDUCTION: This exam was performed according to  the departmental dose-optimization program which includes automated exposure control, adjustment of the mA and/or kV according to patient size and/or use of iterative reconstruction technique. COMPARISON:  CT Head 03/14/22 FINDINGS: Brain: No evidence of acute infarction, hemorrhage, hydrocephalus, extra-axial collection or mass lesion/mass effect. Enlarged and partially empty sella. Vascular: No hyperdense vessel or unexpected calcification. Skull: Normal. Negative for fracture or focal lesion. Sinuses/Orbits: No middle ear or mastoid effusion. Paranasal sinuses are clear. Orbits are unremarkable. Other: None. IMPRESSION: 1. No acute intracranial abnormality. 2. Enlarged and partially empty sella, which is a nonspecific finding but can be seen in the setting of idiopathic intracranial hypertension. Electronically Signed   By: Lorenza Cambridge M.D.   On: 09/25/2022 10:42    Procedures Procedures    Medications Ordered in ED Medications  sodium chloride 0.9 % bolus 1,000 mL (0 mLs Intravenous Stopped 09/25/22 1216)  metoCLOPramide (REGLAN) injection 10 mg (10 mg Intravenous Given 09/25/22 0954)  diphenhydrAMINE (BENADRYL) injection 25 mg (25 mg Intravenous Given 09/25/22 0955)  ketorolac (TORADOL) 15 MG/ML injection 15 mg (15 mg Intravenous  Given 09/25/22 0955)    ED Course/ Medical Decision Making/ A&P Clinical Course as of 09/25/22 1900  Mon Sep 25, 2022  0933 Int leg numbness x 1 week Frontal headache with dizziness this am  [CR]    Clinical Course User Index [CR] Peter Garter, PA                             Medical Decision Making Amount and/or Complexity of Data Reviewed Labs: ordered.  Risk Prescription drug management.   This patient presents to the ED for concern of headache, leg numbness, this involves an extensive number of treatment options, and is a complaint that carries with it a high risk of complications and morbidity.  The differential diagnosis includes CVA, cerebral venous thrombosis, carotid artery/vertebral artery dissection, migraine/tension/cluster headache, malignancy, electrolyte derangement, ischemic limb, DVT, acute fracture/dislocation   Co morbidities that complicate the patient evaluation  See HPI   Additional history obtained:  Additional history obtained from EMR External records from outside source obtained and reviewed including hospital records   Lab Tests:  I Ordered, and personally interpreted labs.  The pertinent results include: She leukocytosis of 14.1.  No evidence anemia.  Platelets within normal range.  No electrolyte abnormalities.  Elevation of BUN of 22 with normal GFR and creatinine.  Mild hypocalcemia of 8.4.   Imaging Studies ordered:  I ordered imaging studies including CT head I independently visualized and interpreted imaging which showed no acute intracranial abnormality.  Enlarged and partially empty sella. I agree with the radiologist interpretation  Cardiac Monitoring: / EKG:  The patient was maintained on a cardiac monitor.  I personally viewed and interpreted the cardiac monitored which showed an underlying rhythm of: Sinus rhythm   Consultations Obtained:  N/a   Problem List / ED Course / Critical interventions / Medication  management  Headache I ordered medication including 1 L normal saline, Benadryl, Toradol, Reglan   Reevaluation of the patient after these medicines showed that the patient improved I have reviewed the patients home medicines and have made adjustments as needed   Social Determinants of Health:  Polysubstance use/abuse.  Chronic cigarette use.   Test / Admission - Considered:  Headache Vitals signs  within normal range and stable throughout visit. Laboratory/imaging studies significant for: See above 42 year old female presents emergency department complaints of headache associated dizziness.  Headache similar nature to other headaches in the past and location but slightly increased in severity.  Patient noted near resolution of symptoms with administration of migraine cocktail.  Seem more likely peripheral cause of vertigo given explanation of patient's symptoms, lack of acute findings on neurologic exam/CT scan as well as resolution of symptoms with migraine cocktail.  Regarding patient's lower extremity numbness, seems to be related to mechanical compression of peripheral nerves given the relief of symptoms when patient ambulates.  Low suspicion related to central pathology, cauda equina, CVA, spinal epidural abscess.  Patient recommended anti-inflammatory medication as well as frequent changing of positions and ambulating so as to avoid symptoms of numbness of bilateral lower extremities.  Patient recommended follow-up with neurology for reassessment regarding recurrent headaches.  Treatment plan discussed at length with patient and she acknowledged understanding was agreeable to said plan. Worrisome signs and symptoms were discussed with the patient, and the patient acknowledged understanding to return to the ED if noticed. Patient was stable upon discharge.          Final Clinical Impression(s) / ED Diagnoses Final diagnoses:  Acute nonintractable headache, unspecified headache  type    Rx / DC Orders ED Discharge Orders          Ordered    meclizine (ANTIVERT) 12.5 MG tablet  3 times daily PRN        09/25/22 1059              Peter Garter, [place] 09/25/22 1900    Loetta Rough, MD 09/26/22 [phone]

## 2022-09-25 NOTE — ED Notes (Signed)
Patient transported to CT 

## 2022-10-02 ENCOUNTER — Ambulatory Visit: Payer: Self-pay | Admitting: Physician Assistant

## 2022-10-02 ENCOUNTER — Encounter: Payer: Self-pay | Admitting: Physician Assistant

## 2022-10-02 VITALS — BP 134/84 | HR 80 | Ht 64.0 in | Wt 360.0 lb

## 2022-10-02 DIAGNOSIS — R7303 Prediabetes: Secondary | ICD-10-CM

## 2022-10-02 DIAGNOSIS — M5442 Lumbago with sciatica, left side: Secondary | ICD-10-CM

## 2022-10-02 DIAGNOSIS — Z6841 Body Mass Index (BMI) 40.0 and over, adult: Secondary | ICD-10-CM

## 2022-10-02 DIAGNOSIS — G8929 Other chronic pain: Secondary | ICD-10-CM

## 2022-10-02 DIAGNOSIS — G43909 Migraine, unspecified, not intractable, without status migrainosus: Secondary | ICD-10-CM

## 2022-10-02 DIAGNOSIS — F3341 Major depressive disorder, recurrent, in partial remission: Secondary | ICD-10-CM

## 2022-10-02 DIAGNOSIS — I10 Essential (primary) hypertension: Secondary | ICD-10-CM

## 2022-10-02 DIAGNOSIS — K219 Gastro-esophageal reflux disease without esophagitis: Secondary | ICD-10-CM

## 2022-10-02 DIAGNOSIS — E6609 Other obesity due to excess calories: Secondary | ICD-10-CM

## 2022-10-02 LAB — POCT GLYCOSYLATED HEMOGLOBIN (HGB A1C): Hemoglobin A1C: 6.4 % — AB (ref 4.0–5.6)

## 2022-10-02 LAB — GLUCOSE, POCT (MANUAL RESULT ENTRY): POC Glucose: 200 mg/dl — AB (ref 70–99)

## 2022-10-02 MED ORDER — LISINOPRIL 10 MG PO TABS: 10.0000 mg | ORAL_TABLET | Freq: Every morning | ORAL | 2 refills | Status: AC

## 2022-10-02 MED ORDER — GABAPENTIN 400 MG PO CAPS: 800.0000 mg | ORAL_CAPSULE | Freq: Three times a day (TID) | ORAL | 2 refills | Status: AC

## 2022-10-02 MED ORDER — SUMATRIPTAN 20 MG/ACT NA SOLN
20.0000 mg | NASAL | Status: AC | PRN
  Administered 2022-10-02: 20 mg via NASAL

## 2022-10-02 MED ORDER — SUMATRIPTAN SUCCINATE 50 MG PO TABS: 50.0000 mg | ORAL_TABLET | ORAL | 0 refills | Status: AC | PRN

## 2022-10-02 MED ORDER — CYCLOBENZAPRINE HCL 5 MG PO TABS: ORAL_TABLET | ORAL | 0 refills | Status: AC

## 2022-10-02 MED ORDER — AMLODIPINE BESYLATE 5 MG PO TABS: 5.0000 mg | ORAL_TABLET | Freq: Every day | ORAL | 2 refills | Status: AC

## 2022-10-02 MED ORDER — ACIDOPHILUS PO CAPS: 1.0000 | ORAL_CAPSULE | Freq: Two times a day (BID) | ORAL | 1 refills | Status: AC

## 2022-10-02 MED ORDER — HYDROXYZINE HCL 50 MG PO TABS: 50.0000 mg | ORAL_TABLET | Freq: Three times a day (TID) | ORAL | 1 refills | Status: AC | PRN

## 2022-10-02 NOTE — Progress Notes (Unsigned)
Established Patient Office Visit  Subjective   Patient ID: Sarah Cervantes, female    DOB: 28-Dec-1980  Age: 42 y.o. MRN: [id number]  Chief Complaint  Patient presents with   Dizziness     Started about an 1 hour ago, hx prediabetes.    Back Pain    Left lower back pain, pain score of 7    Medication Refill    Leaving on Thursday for [facility] in [place].     States that she continues to be treated for substance abuse at [facility] residential treatment [facility].  States that she is leaving at the end of this week to go to long-term care in [place].  States that she has been experiencing headaches, states that they are frontal in nature, will experience nausea and photophobia when they occur.  States that she begins experiencing dizziness prior to the onset of the headache.  States that this has been ongoing for the "past year" but states that they seem to be more frequent recently.  States that she was seen in the emergency department on April 29 and was treated for a migraine headache.  States that she started experiencing dizziness approximately 1 hour ago and states that she feels she is starting to get a migraine headache.  Denies nausea or photophobia at this time.  States that she did complete the prednisone taper for her left-sided sciatica, states it did improve but quickly resumed on completion of prednisone taper.  States that the 10 mg of Flexeril makes her sleepy so she is only taking it at bedtime.     Past Medical History:  Diagnosis Date   Cocaine abuse, daily use (HCC) 03/23/2018   ETOH abuse 03/23/2018   Heroin abuse (HCC) 03/23/2018   Social History   Socioeconomic History   Marital status: Single    Spouse name: Not on file   Number of children: Not on file   Years of education: Not on file   Highest education level: Not on file  Occupational History   Not on file  Tobacco Use   Smoking status: Every Day    Packs/day: 0.50    Years: 30.00     Additional pack years: 0.00    Total pack years: 15.00    Types: Cigarettes    Last attempt to quit: 01/12/2022    Years since quitting: 0.7   Smokeless tobacco: Never  Vaping Use   Vaping Use: Never used  Substance and Sexual Activity   Alcohol use: Yes    Alcohol/week: 12.0 - 15.0 standard drinks of alcohol    Types: 12 - 15 Cans of beer per week    Comment: drinking daily for the past week   Drug use: Yes    Types: Cocaine   Sexual activity: Yes    Birth control/protection: None  Other Topics Concern   Not on file  Social History Narrative   Not on file   Social Determinants of Health   Financial Resource Strain: Not on file  Food Insecurity: Food Insecurity Present (08/06/2022)   Hunger Vital Sign    Worried About Running Out of Food in the Last Year: Sometimes true    Ran Out of Food in the Last Year: Sometimes true  Transportation Needs: No Transportation Needs (08/06/2022)   PRAPARE - Administrator, Civil Service (Medical): No    Lack of Transportation (Non-Medical): No  Physical Activity: Not on file  Stress: Not on file  Social Connections: Not  on file  Intimate Partner Violence: Not At Risk (08/06/2022)   Humiliation, Afraid, Rape, and Kick questionnaire    Fear of Current or Ex-Partner: No    Emotionally Abused: No    Physically Abused: No    Sexually Abused: No   History reviewed. No pertinent family history. Allergies  Allergen Reactions   Latex Swelling, Rash and Other (See Comments)    Hands swell    Review of Systems  Constitutional:  Negative for chills and fever.  HENT: Negative.    Eyes:  Negative for photophobia.  Respiratory:  Negative for shortness of breath.   Cardiovascular:  Negative for chest pain.  Gastrointestinal:  Negative for nausea and vomiting.  Genitourinary: Negative.   Musculoskeletal:  Positive for back pain.  Skin: Negative.   Neurological:  Positive for dizziness and headaches.  Endo/Heme/Allergies: Negative.    Psychiatric/Behavioral: Negative.        Objective:     BP 134/84 (BP Location: Left Arm, Patient Position: Sitting, Cuff Size: Large)   Pulse 80   Ht 5\' 4"  (1.626 m)   Wt (!) 360 lb (163.3 kg)   LMP 07/01/2021   SpO2 95%   BMI 61.79 kg/m    Physical Exam Vitals and nursing note reviewed.  Constitutional:      Appearance: Normal appearance. She is obese.  HENT:     Head: Normocephalic and atraumatic.     Right Ear: External ear normal.     Left Ear: External ear normal.     Nose: Nose normal.     Mouth/Throat:     Mouth: Mucous membranes are moist.     Pharynx: Oropharynx is clear.  Eyes:     Extraocular Movements: Extraocular movements intact.     Conjunctiva/sclera: Conjunctivae normal.     Pupils: Pupils are equal, round, and reactive to light.  Cardiovascular:     Rate and Rhythm: Normal rate and regular rhythm.     Pulses: Normal pulses.     Heart sounds: Normal heart sounds.  Pulmonary:     Effort: Pulmonary effort is normal.     Breath sounds: Normal breath sounds.  Musculoskeletal:        General: Normal range of motion.     Cervical back: Normal range of motion and neck supple.  Skin:    General: Skin is warm and dry.  Neurological:     General: No focal deficit present.     Mental Status: She is alert and oriented to person, place, and time.     Cranial Nerves: No cranial nerve deficit.  Psychiatric:        Mood and Affect: Mood normal.        Behavior: Behavior normal.        Thought Content: Thought content normal.        Judgment: Judgment normal.        Assessment & Plan:   Problem List Items Addressed This Visit       Cardiovascular and Mediastinum   Essential hypertension   Relevant Medications   lisinopril (ZESTRIL) 10 MG tablet   amLODipine (NORVASC) 5 MG tablet   Migraine without status migrainosus, not intractable - Primary   Relevant Medications   SUMAtriptan (IMITREX) nasal spray 20 mg   lisinopril (ZESTRIL) 10 MG tablet    gabapentin (NEURONTIN) 400 MG capsule   cyclobenzaprine (FLEXERIL) 5 MG tablet   amLODipine (NORVASC) 5 MG tablet   SUMAtriptan (IMITREX) 50 MG tablet  Digestive   Gastroesophageal reflux disease   Relevant Medications   Lactobacillus (ACIDOPHILUS) CAPS capsule     Nervous and Auditory   Chronic left-sided low back pain with left-sided sciatica   Relevant Medications   hydrOXYzine (ATARAX) 50 MG tablet   gabapentin (NEURONTIN) 400 MG capsule   cyclobenzaprine (FLEXERIL) 5 MG tablet     Other   Recurrent major depressive disorder, in partial remission (HCC)   Relevant Medications   hydrOXYzine (ATARAX) 50 MG tablet   Prediabetes   Relevant Orders   POCT glycosylated hemoglobin (Hb A1C) (Completed)   POCT glucose (manual entry) (Completed)  1. Recurrent major depressive disorder, in partial remission (HCC) Continue current regimen.  Patient encouraged to continue follow-up with primary care provider in [place].  Red flags given for prompt reevaluation - hydrOXYzine (ATARAX) 50 MG tablet; Take 1 tablet (50 mg total) by mouth 3 (three) times daily as needed for anxiety.  Dispense: 30 tablet; Refill: 1  2. Chronic left-sided low back pain with left-sided sciatica Continue current regimen, change Flexeril 5 mg, 1 to 2 tablets every 8 hours as needed - gabapentin (NEURONTIN) 400 MG capsule; Take 2 capsules (800 mg total) by mouth 3 (three) times daily.  Dispense: 180 capsule; Refill: 2 - cyclobenzaprine (FLEXERIL) 5 MG tablet; Take 1-2 tabs PO q8hrs PRN  Dispense: 60 tablet; Refill: 0  3. Essential hypertension Continue current regimen.  Patient encouraged to check blood pressure at home, keep a written log and have available for all office visits - lisinopril (ZESTRIL) 10 MG tablet; Take 1 tablet (10 mg total) by mouth in the morning.  Dispense: 30 tablet; Refill: 2 - amLODipine (NORVASC) 5 MG tablet; Take 1 tablet (5 mg total) by mouth daily.  Dispense: 30 tablet; Refill:  2  4. Migraine without status migrainosus, not intractable, unspecified migraine type Patient given dose sumatriptan in clinic.  Trial sumatriptan, patient encouraged to keep migraine diary. - SUMAtriptan (IMITREX) nasal spray 20 mg - SUMAtriptan (IMITREX) 50 MG tablet; Take 1 tablet (50 mg total) by mouth every 2 (two) hours as needed for migraine. May repeat in 2 hours if headache persists or recurs.  Dispense: 10 tablet; Refill: 0  5. Prediabetes A1c 6.4.  Patient strongly encouraged to follow a low sugar diet, have A1c rechecked in 3 months.  Patient understands and agrees - POCT glycosylated hemoglobin (Hb A1C) - POCT glucose (manual entry)  6. Gastroesophageal reflux disease, unspecified whether esophagitis present Continue current regimen - Lactobacillus (ACIDOPHILUS) CAPS capsule; Take 1 capsule by mouth 2 (two) times daily.  Dispense: 60 capsule; Refill: 1    I have reviewed the patient's medical history (PMH, PSH, Social History, Family History, Medications, and allergies) , and have been updated if relevant. I spent 30 minutes reviewing chart and  face to face time with patient.    Return if symptoms worsen or fail to improve.    Kasandra Knudsen Mayers, PA-C

## 2022-10-02 NOTE — Patient Instructions (Signed)
To help with your migraines, you can use sumatriptan on the onset of a migraine, you can repeat in 2 hours, but no more than 2 doses in a 24-hour timeframe.  I encourage you to keep a log of migraines and follow-up with provider if they continue to be frequent.  To help with your sciatica, you can use the Flexeril 5 mg, 1 to 2 tablets every 8 hours as needed.  Roney Jaffe, PA-C Physician Assistant [facility] [email or web address]   Migraine Headache A migraine headache is an intense pulsing or throbbing pain on one or both sides of the head. Migraine headaches may also cause other symptoms, such as nausea, vomiting, and sensitivity to light and noise. A migraine headache can last from 4 hours to 3 days. Talk with your health care provider about what things may bring on (trigger) your migraine headaches. What are the causes? The exact cause is not known. However, a migraine may be caused when nerves in the brain get irritated and release chemicals that cause blood vessels to become inflamed. This inflammation causes pain. Migraines may be triggered or caused by: Smoking. Medicines, such as: Nitroglycerin, which is used to treat chest pain. Birth control pills. Estrogen. Certain blood pressure medicines. Foods or drinks that contain nitrates, glutamate, aspartame, MSG, or tyramine. Certain foods or drinks, such as aged cheeses, chocolate, alcohol, or caffeine. Doing physical activity that is very hard. Other triggers may include: Menstruation. Pregnancy. Hunger. Stress. Getting too much or too little sleep. Weather changes. Tiredness (fatigue). What increases the risk? The following factors may make you more likely to have migraine headaches: Being between the ages of 75-39 years old. Being female. Having a family history of migraine headaches. Being Caucasian. Having a mental health condition, such as depression or  anxiety. Being obese. What are the signs or symptoms? The main symptom of this condition is pulsing or throbbing pain. This pain may: Happen in any area of the head, such as on one or both sides. Make it hard to do daily activities. Get worse with physical activity. Get worse around bright lights, loud noises, or smells. Other symptoms may include: Nausea. Vomiting. Dizziness. Before a migraine headache starts, you may get warning signs (an aura). An aura may include: Seeing flashing lights or having blind spots. Seeing bright spots, halos, or zigzag lines. Having tunnel vision or blurred vision. Having numbness or a tingling feeling. Having trouble talking. Having muscle weakness. After a migraine ends, you may have symptoms. These may include: Feeling tired. Trouble concentrating. How is this diagnosed? A migraine headache can be diagnosed based on: Your symptoms. A physical exam. Tests, such as: A CT scan or an MRI of the head. These tests can help rule out other causes of headaches. Taking fluid from the spine (lumbar puncture) to examine it (cerebrospinal fluid analysis, or CSF analysis). How is this treated? This condition may be treated with medicines that: Relieve pain and nausea. Prevent migraines. Treatment may also include: Acupuncture. Lifestyle changes like avoiding foods that trigger migraine headaches. Learning ways to control your body (biofeedback). Talk therapy to help you know and deal with negative thoughts (cognitive behavioral therapy). Follow these instructions at home: Medicines Take over-the-counter and prescription medicines only as told by your provider. Ask your provider if the medicine prescribed to you: Requires you to avoid driving or using machinery. Can cause constipation. You may need to take these actions to prevent or treat constipation: Drink enough fluid to keep your  pee (urine) pale yellow. Take over-the-counter or prescription  medicines. Eat foods that are high in fiber, such as beans, whole grains, and fresh fruits and vegetables. Limit foods that are high in fat and processed sugars, such as fried or sweet foods. Lifestyle  Do not drink alcohol. Do not use any products that contain nicotine or tobacco. These products include cigarettes, chewing tobacco, and vaping devices, such as e-cigarettes. If you need help quitting, ask your provider. Get 7-9 hours of sleep each night, or the amount recommended by your provider. Find ways to manage stress, such as meditation, deep breathing, or yoga. Try to exercise regularly. This can help lessen how bad and how often your migraines occur. General instructions Keep a journal to find out what triggers your migraines, so you can avoid those things. For example, write down: What you eat and drink. How much sleep you get. Any change to your diet or medicines. If you have a migraine headache: Avoid things that make your symptoms worse, such as bright lights. Lie down in a dark, quiet room. Do not drive or use machinery. Ask your provider what activities are safe for you while you have symptoms. Keep all follow-up visits. Your provider will monitor your symptoms and recommend any further treatment. Where to find more information Coalition for Headache and Migraine Patients (CHAMP): headachemigraine.org American Migraine Foundation: americanmigrainefoundation.org National Headache Foundation: headaches.org Contact a health care provider if: You have symptoms that are different or worse than your usual migraine headache symptoms. You have more than 15 days of headaches in one month. Get help right away if: Your migraine headache becomes severe or lasts more than 72 hours. You have a fever or stiff neck. You have vision loss. Your muscles feel weak or like you cannot control them. You lose your balance often or have trouble walking. You faint. You have a seizure. This  information is not intended to replace advice given to you by your health care provider. Make sure you discuss any questions you have with your health care provider. Document Revised: 01/09/2022 Document Reviewed: 01/09/2022 Elsevier Patient Education  2023 [employer].

## 2022-10-03 ENCOUNTER — Encounter: Payer: Self-pay | Admitting: Physician Assistant

## 2022-10-03 DIAGNOSIS — R7303 Prediabetes: Secondary | ICD-10-CM | POA: Insufficient documentation

## 2022-10-03 DIAGNOSIS — G43909 Migraine, unspecified, not intractable, without status migrainosus: Secondary | ICD-10-CM | POA: Insufficient documentation

## 2023-05-08 ENCOUNTER — Other Ambulatory Visit: Payer: Self-pay
